# Patient Record
Sex: Female | Born: 1935 | Race: White | Hispanic: No | Smoking: Never smoker
Health system: Southern US, Community
[De-identification: ages and names within clinical notes are randomized; demographics above are authoritative.]

## PROBLEM LIST (undated history)

## (undated) DIAGNOSIS — I5042 Chronic combined systolic (congestive) and diastolic (congestive) heart failure: Secondary | ICD-10-CM

## (undated) DIAGNOSIS — H269 Unspecified cataract: Secondary | ICD-10-CM

## (undated) DIAGNOSIS — R001 Bradycardia, unspecified: Secondary | ICD-10-CM

## (undated) DIAGNOSIS — I340 Nonrheumatic mitral (valve) insufficiency: Secondary | ICD-10-CM

## (undated) DIAGNOSIS — H409 Unspecified glaucoma: Secondary | ICD-10-CM

## (undated) DIAGNOSIS — E119 Type 2 diabetes mellitus without complications: Secondary | ICD-10-CM

## (undated) DIAGNOSIS — S301XXA Contusion of abdominal wall, initial encounter: Secondary | ICD-10-CM

## (undated) DIAGNOSIS — I4892 Unspecified atrial flutter: Secondary | ICD-10-CM

## (undated) DIAGNOSIS — I10 Essential (primary) hypertension: Secondary | ICD-10-CM

## (undated) DIAGNOSIS — N2889 Other specified disorders of kidney and ureter: Secondary | ICD-10-CM

## (undated) DIAGNOSIS — I480 Paroxysmal atrial fibrillation: Secondary | ICD-10-CM

## (undated) DIAGNOSIS — K219 Gastro-esophageal reflux disease without esophagitis: Secondary | ICD-10-CM

## (undated) DIAGNOSIS — E785 Hyperlipidemia, unspecified: Secondary | ICD-10-CM

## (undated) DIAGNOSIS — S3011XA Contusion of abdominal wall, initial encounter: Secondary | ICD-10-CM

## (undated) DIAGNOSIS — M1990 Unspecified osteoarthritis, unspecified site: Secondary | ICD-10-CM

## (undated) HISTORY — DX: Other specified disorders of kidney and ureter: N28.89

## (undated) HISTORY — DX: Contusion of abdominal wall, initial encounter: S30.11XA

## (undated) HISTORY — PX: CATARACT EXTRACTION: SUR2

## (undated) HISTORY — PX: WISDOM TOOTH EXTRACTION: SHX21

## (undated) HISTORY — DX: Unspecified cataract: H26.9

## (undated) HISTORY — DX: Gastro-esophageal reflux disease without esophagitis: K21.9

## (undated) HISTORY — DX: Unspecified glaucoma: H40.9

## (undated) HISTORY — DX: Chronic combined systolic (congestive) and diastolic (congestive) heart failure: I50.42

## (undated) HISTORY — DX: Type 2 diabetes mellitus without complications: E11.9

## (undated) HISTORY — DX: Unspecified osteoarthritis, unspecified site: M19.90

## (undated) HISTORY — PX: OTHER SURGICAL HISTORY: SHX169

## (undated) HISTORY — DX: Bradycardia, unspecified: R00.1

## (undated) HISTORY — DX: Essential (primary) hypertension: I10

## (undated) HISTORY — DX: Hyperlipidemia, unspecified: E78.5

## (undated) HISTORY — DX: Contusion of abdominal wall, initial encounter: S30.1XXA

---

## 2013-07-05 ENCOUNTER — Ambulatory Visit: Payer: Self-pay | Admitting: Ophthalmology

## 2013-08-09 ENCOUNTER — Ambulatory Visit: Payer: Self-pay | Admitting: Ophthalmology

## 2013-12-15 NOTE — Telephone Encounter (Signed)
This encounter was created in error - please disregard.

## 2014-04-06 LAB — HM DIABETES EYE EXAM

## 2014-04-24 ENCOUNTER — Emergency Department: Payer: Self-pay | Admitting: Emergency Medicine

## 2014-04-25 DIAGNOSIS — S52602A Unspecified fracture of lower end of left ulna, initial encounter for closed fracture: Secondary | ICD-10-CM

## 2014-04-25 DIAGNOSIS — S52502A Unspecified fracture of the lower end of left radius, initial encounter for closed fracture: Secondary | ICD-10-CM | POA: Insufficient documentation

## 2014-04-26 ENCOUNTER — Ambulatory Visit: Payer: Self-pay | Admitting: Orthopedic Surgery

## 2014-07-29 NOTE — Op Note (Signed)
PATIENT NAME:  Amber Wolfe, Amber Wolfe MR#:  [id number] DATE OF BIRTH:  07/29/1931  DATE OF PROCEDURE:  04/26/2014  PREOPERATIVE DIAGNOSIS: Comminuted left distal radius fracture, intra-articular.   POSTOPERATIVE DIAGNOSIS: Comminuted left distal radius fracture, intra-articular.  PROCEDURE: Open reduction and internal fixation, left distal radius.   ANESTHESIA: General.   SURGEON: Hessie Knows, MD   DESCRIPTION OF PROCEDURE: The patient was brought to the operating room and after adequate anesthesia was obtained, the left arm was prepped and draped in the usual sterile fashion with a tourniquet applied to the upper arm. After patient identification and timeout procedures were completed, volar approach made to the distal radius. The FCR tendon was identified and the sheath incised. Tendon was retracted radially. Deep fascia incised. The pronator was partially torn off the distal fragment and proximal fragment. It was then elevated off the radial border. Fingertrap traction was applied to the start of the case and this helped to restore length. Freer elevator was used to get essentially anatomic alignment. Distal first approach was utilized with a short narrow DVR plate. This was applied distally, and a pin used to hold it in position. The smooth pegs were placed and then with three 10 mm cortical screws, the plate was brought down to the shaft and there was essentially anatomic alignment present. The wound was thoroughly irrigated. The tourniquet let down. Hemostasis was checked with electrocautery. The wound was then closed with 3-0 Vicryl subcutaneously and 4-0 nylon skin sutures. Xeroform, 4 x 4's, Webril, and a volar splint were applied followed by an Ace wrap.   TOURNIQUET TIME: 18 minutes at 250 mmHg.   COMPLICATIONS: None.  SPECIMEN: None.    ____________________________ Laurene Footman, MD mjm:bm D: 04/26/2014 19:11:51 ET T: 04/27/2014 02:02:08 ET JOB#: [id number]  cc: Laurene Footman, MD,  <Dictator> Laurene Footman MD ELECTRONICALLY SIGNED 04/27/2014 10:01

## 2015-04-15 LAB — HM DIABETES EYE EXAM

## 2015-04-16 LAB — HM DIABETES EYE EXAM

## 2015-04-29 ENCOUNTER — Ambulatory Visit: Payer: Self-pay | Admitting: Family Medicine

## 2015-05-15 ENCOUNTER — Ambulatory Visit (INDEPENDENT_AMBULATORY_CARE_PROVIDER_SITE_OTHER): Payer: Medicare Other | Admitting: Family Medicine

## 2015-05-15 ENCOUNTER — Encounter: Payer: Self-pay | Admitting: Family Medicine

## 2015-05-15 VITALS — BP 134/74 | HR 88 | Temp 97.1°F | Ht <= 58 in | Wt 174.4 lb

## 2015-05-15 DIAGNOSIS — I4891 Unspecified atrial fibrillation: Secondary | ICD-10-CM | POA: Insufficient documentation

## 2015-05-15 DIAGNOSIS — E7800 Pure hypercholesterolemia, unspecified: Secondary | ICD-10-CM | POA: Diagnosis not present

## 2015-05-15 DIAGNOSIS — R002 Palpitations: Secondary | ICD-10-CM | POA: Diagnosis not present

## 2015-05-15 DIAGNOSIS — Z5181 Encounter for therapeutic drug level monitoring: Secondary | ICD-10-CM

## 2015-05-15 DIAGNOSIS — M19041 Primary osteoarthritis, right hand: Secondary | ICD-10-CM

## 2015-05-15 DIAGNOSIS — R600 Localized edema: Secondary | ICD-10-CM | POA: Insufficient documentation

## 2015-05-15 DIAGNOSIS — M19042 Primary osteoarthritis, left hand: Secondary | ICD-10-CM

## 2015-05-15 DIAGNOSIS — I10 Essential (primary) hypertension: Secondary | ICD-10-CM | POA: Insufficient documentation

## 2015-05-15 DIAGNOSIS — E1140 Type 2 diabetes mellitus with diabetic neuropathy, unspecified: Secondary | ICD-10-CM | POA: Insufficient documentation

## 2015-05-15 DIAGNOSIS — H9193 Unspecified hearing loss, bilateral: Secondary | ICD-10-CM

## 2015-05-15 MED ORDER — LISINOPRIL 10 MG PO TABS: 10.0000 mg | ORAL_TABLET | Freq: Every day | ORAL | Status: DC

## 2015-05-15 MED ORDER — RIVAROXABAN 20 MG PO TABS: 20.0000 mg | ORAL_TABLET | Freq: Every day | ORAL | Status: DC

## 2015-05-15 MED ORDER — ASPIRIN EC 81 MG PO TBEC: 81.0000 mg | DELAYED_RELEASE_TABLET | Freq: Every day | ORAL | Status: DC

## 2015-05-15 MED ORDER — NAPROXEN SODIUM 220 MG PO TABS: 220.0000 mg | ORAL_TABLET | Freq: Two times a day (BID) | ORAL | Status: DC

## 2015-05-15 NOTE — Assessment & Plan Note (Signed)
Will stop the aleve for now because of addition of the anticoagulant; plan to suggest turmeric and tylenol at f/u next week

## 2015-05-15 NOTE — Assessment & Plan Note (Addendum)
Patient cannot recall the last time anyone listened to her heart; she is completely asymptomatic; she could have gone into atrial fibrillation one week ago, one month ago, one year ago, we just don't know; ; risk of stroke discussed; CHA2DS2-VASc score of 5; discussed anticoagulation with her; stop aspirin and start Xarelto; calculated CrCl based on last year's creatinine with [facility] Gault, today's labs still pending though, but she's well above 50, so using 20 mg strength; discussed risk of stroke in detail; discussed F-A-S-T with her, reasons to call 911; refer to cardiologist for further evaluation; rate is controlled today

## 2015-05-15 NOTE — Assessment & Plan Note (Signed)
A1c obtained today, 7.0; discussed results with patient; she is on a sulfonylurea from her previous doctor; records today reviewed in part (more than 15 years of records brought with her today); I did not see that she has been on metformin in the past; she does not have that listed as an allergy; I don't like to use sulfonylureas in the elderly, but she comes to me on that with well-controlled A1c and only very rare hypoglycemic episodes; we will see what her creatinine is (drawn today) and may discuss switching her from sulfonylurea to metformin at next visit; foot exam done by MD today; encouraged yearly eye exams; she has very slight neuropathy in great toes; I reviewed years of records (quickly, obviously), but did not see that she has been on an ACE-I or an ARB for renal protection; urine microalbumin:creatinine was normal today; start low-dose lisinopril and see her back next week

## 2015-05-15 NOTE — Assessment & Plan Note (Signed)
She has been having pedal and ankle edema for a long time on her current CCB; will stop this and start ACE-I, which would be standard of care for her diabetes anyway; DASH guidelines discussed and encouraged; return in one week for recheck

## 2015-05-15 NOTE — Patient Instructions (Addendum)
STOP felodipine STOP the aleve STOP the aspirin Start the new blood thinner Start new blood pressure medicine called lisinopril Avoid salt substitutes Please do schedule an appointment to see your eye doctor, and have your eyes examined every year Check your feet every night and let me know right away of any sores, infections, numbness, etc. Try to limit sweets, white bread, white rice, white potatoes It is okay for you to not check your fingerstick blood sugars (per [employer] of Endocrinology Best Practices) Your goal blood pressure is less than 140 mmHg on top. Try to follow the DASH guidelines (DASH stands for Dietary Approaches to Stop Hypertension) Try to limit the sodium in your diet.  Ideally, consume less than 1.5 grams (less than 1,500mg ) per day. Do not add salt when cooking or at the table.  Check the sodium amount on labels when shopping, and choose items lower in sodium when given a choice. Avoid or limit foods that already contain a lot of sodium. Eat a diet rich in fruits and vegetables and whole grains. Return in two weeks for recheck of blood pressure and kidneys and potassium; you do not need to come fasting Try to limit saturated fats in your diet (bologna, hot dogs, barbeque, cheeseburgers, hamburgers, steak, bacon, sausage, cheese, etc.) and get more fresh fruits, vegetables, and whole grains You might try buckwheat pancakes instead of regular white flour which might be better for your diabetes Return in one week  Stroke Prevention Some medical conditions and behaviors are associated with an increased chance of having a stroke. You may prevent a stroke by making healthy choices and managing medical conditions. HOW CAN I REDUCE MY RISK OF HAVING A STROKE?   Stay physically active. Get at least 30 minutes of activity on most or all days.  Do not smoke. It may also be helpful to avoid exposure to secondhand smoke.  Limit alcohol use. Moderate alcohol use is  considered to be:  No more than 2 drinks per day for men.  No more than 1 drink per day for nonpregnant women.  Eat healthy foods. This involves:  Eating 5 or more servings of fruits and vegetables a day.  Making dietary changes that address high blood pressure (hypertension), high cholesterol, diabetes, or obesity.  Manage your cholesterol levels.  Making food choices that are high in fiber and low in saturated fat, trans fat, and cholesterol may control cholesterol levels.  Take any prescribed medicines to control cholesterol as directed by your health care provider.  Manage your diabetes.  Controlling your carbohydrate and sugar intake is recommended to manage diabetes.  Take any prescribed medicines to control diabetes as directed by your health care provider.  Control your hypertension.  Making food choices that are low in salt (sodium), saturated fat, trans fat, and cholesterol is recommended to manage hypertension.  Ask your health care provider if you need treatment to lower your blood pressure. Take any prescribed medicines to control hypertension as directed by your health care provider.  If you are 18-34 years of age, have your blood pressure checked every 3-5 years. If you are 71 years of age or older, have your blood pressure checked every year.  Maintain a healthy weight.  Reducing calorie intake and making food choices that are low in sodium, saturated fat, trans fat, and cholesterol are recommended to manage weight.  Stop drug abuse.  Avoid taking birth control pills.  Talk to your health care provider about the risks of taking  birth control pills if you are over 41 years old, smoke, get migraines, or have ever had a blood clot.  Get evaluated for sleep disorders (sleep apnea).  Talk to your health care provider about getting a sleep evaluation if you snore a lot or have excessive sleepiness.  Take medicines only as directed by your health care  provider.  For some people, aspirin or blood thinners (anticoagulants) are helpful in reducing the risk of forming abnormal blood clots that can lead to stroke. If you have the irregular heart rhythm of atrial fibrillation, you should be on a blood thinner unless there is a good reason you cannot take them.  Understand all your medicine instructions.  Make sure that other conditions (such as anemia or atherosclerosis) are addressed. SEEK IMMEDIATE MEDICAL CARE IF:   You have sudden weakness or numbness of the face, arm, or leg, especially on one side of the body.  Your face or eyelid droops to one side.  You have sudden confusion.  You have trouble speaking (aphasia) or understanding.  You have sudden trouble seeing in one or both eyes.  You have sudden trouble walking.  You have dizziness.  You have a loss of balance or coordination.  You have a sudden, severe headache with no known cause.  You have new chest pain or an irregular heartbeat. Any of these symptoms may represent a serious problem that is an emergency. Do not wait to see if the symptoms will go away. Get medical help at once. Call your local emergency services (911 in [place] Do not drive yourself to the hospital.   This information is not intended to replace advice given to you by your health care provider. Make sure you discuss any questions you have with your health care provider.   Document Released: 04/23/2004 Document Revised: 04/06/2014 Document Reviewed: 09/16/2012 Elsevier Interactive Patient Education [employer].

## 2015-05-15 NOTE — Assessment & Plan Note (Addendum)
Very likely related to the calcium channel blocker; she does not appear fluid-overloaded and I do not suspect CHF; stop the CCB; recheck in one week

## 2015-05-15 NOTE — Assessment & Plan Note (Signed)
Check SGPT on the statin 

## 2015-05-15 NOTE — Assessment & Plan Note (Signed)
Open invitation for hearing evaluation; patient politely declined right now but may call if she changes her mind

## 2015-05-15 NOTE — Progress Notes (Signed)
BP 134/74 mmHg  Pulse 88  Temp(Src) 97.1 F (36.2 C)  Ht 4' 4.25" (1.327 m)  Wt 174 lb 6.4 oz (79.107 kg)  BMI 44.92 kg/m2  SpO2 97%   Subjective:    Patient ID: Amber Wolfe, female    DOB: 1931/05/16, 82 y.o.   MRN: [id number]  HPI: Amber Wolfe is a 82 y.o. female  Chief Complaint  Patient presents with  . Establish Care  . Knee Pain    Recieves cortison shots. Hasn't had one in about 8 months so, patient is in pain.    Diabetes, type 2; 8-9 years; does not check sugars; with neuropathy; last eye exam was Jan 9th she thinks (she brought in records and I found an eye exam from Jan 2016, but not 2017, but there is a huge folder full of records), no eye complications; no kidney damage known; does have dry mouth; no blurred vision; not especially having a lot of urine; last blood work was May 2016; brother had bad diabetes and took insulin; she does take an ACE-I  High cholesterol; typical American diet mostly; likes boiled eggs; not much bacon, but does eat some; little bit of cheese like on a cheeseburger or on a bagel; atorvastatin, not hurting all over; no abd pain  High blood pressure; always good; on felodipine for a long time; has ankle and foot swelling, the left foot and ankle is always a little worse than the right foot and ankle, nothing new  Arthitis, hands and right knee; had injection in the left knee by Dr. Hardin Negus and never needed another one in the left; has tramadol; just once in a while; does not take that often; takes two aleve BID sometimes but not all the time; hydrocodone/apap but not taking so taken off of list  Terrible heartburn; spaghetti sauce, anything fried; coffee is okay; salads and onions bother her; fish and spaghetti and junk food  Upon listening to the patient today, I asked about skipped beats or palpitations; she denies any cardiac symptoms; no chest pain, no palpitations; she can not remember the last time a doctor listened to her heart  before today  Past Medical History  Diagnosis Date  . Hyperlipidemia   . Hypertension   . GERD (gastroesophageal reflux disease)   . Diabetes mellitus without complication ([place])     Type II  . Arthritis     In hands  . Cataract   . Glaucoma     Right Eye   Past Surgical History  Procedure Laterality Date  . Cataract extraction Bilateral   . Wrist surgery Left   . Cesarean section    . Wisdom tooth extraction    wrist surgery after falling on ice -- distal left radius fracture  Family History  Problem Relation Age of Onset  . Cataracts Mother   . Heart attack Father   . Heart attack Brother   . Aneurysm Daughter    Social History  Substance Use Topics  . Smoking status: Never Smoker   . Smokeless tobacco: Never Used  . Alcohol Use: No   Relevant past medical, surgical, family and social history reviewed and updated as indicated  Allergies and medications reviewed and updated.  Review of Systems  Constitutional: Negative for unexpected weight change.  HENT: Positive for hearing loss (getting a little worse). Negative for nosebleeds.   Eyes: Negative for visual disturbance.  Respiratory: Negative for shortness of breath.   Cardiovascular: Positive for leg swelling. Negative  for chest pain and palpitations.  Gastrointestinal: Negative for blood in stool.  Endocrine: Negative for polydipsia and polyuria.  Neurological: Negative for tremors.  Hematological: Bruises/bleeds easily (on aspirin).  Psychiatric/Behavioral: Negative for dysphoric mood.   Per HPI unless specifically indicated above     Objective:    BP 134/74 mmHg  Pulse 88  Temp(Src) 97.1 F (36.2 C)  Ht 4' 4.25" (1.327 m)  Wt 174 lb 6.4 oz (79.107 kg)  BMI 44.92 kg/m2  SpO2 97%  Wt Readings from Last 3 Encounters:  05/15/15 174 lb 6.4 oz (79.107 kg)    Physical Exam  Constitutional: She appears well-developed and well-nourished. No distress.  HENT:  Head: Normocephalic and atraumatic.   Eyes: EOM are normal. No scleral icterus.  Neck: No thyromegaly present.  Cardiovascular: Normal rate and normal heart sounds.  An irregularly irregular rhythm present.  Extrasystoles are present. Exam reveals no gallop.   No murmur (no murmurs heard over RUSB, LUSB, LLSB, or apex) heard. Pulmonary/Chest: Effort normal and breath sounds normal. No respiratory distress. She has no wheezes.  Abdominal: Soft. Bowel sounds are normal. She exhibits no distension.  Musculoskeletal: Normal range of motion. She exhibits edema (left > right, pedal and ankle edema, 1+ pitting).  Neurological: She is alert. She exhibits normal muscle tone.  Very minimally diminished sensation in distal great toes plantar surface; otherwise, intact to monofilament testing  Skin: Skin is warm and dry. She is not diaphoretic. No pallor.  Psychiatric: She has a normal mood and affect. Her behavior is normal. Judgment and thought content normal.   Diabetic Foot Form - Detailed   Diabetic Foot Exam - detailed  Diabetic Foot exam was performed with the following findings:  Yes 05/15/2015  4:43 PM  Visual Foot Exam completed.:  Yes  Are the toenails long?:  No  Are the toenails thick?:  No  Are the toenails ingrown?:  No    Pulse Foot Exam completed.:  Yes  Right Dorsalis Pedis:  Present Left Dorsalis Pedis:  Present  Sensory Foot Exam Completed.:  Yes  Swelling:  Yes  Semmes-Weinstein Monofilament Test  R Site 1-Great Toe:  Pos L Site 1-Great Toe:  Pos  R Site 4:  Pos L Site 4:  Pos  R Site 5:  Pos L Site 5:  Pos       Results for orders placed or performed in visit on 04/17/15  HM DIABETES EYE EXAM  Result Value Ref Range   HM Diabetic Eye Exam  No Retinopathy      Assessment & Plan:   Problem List Items Addressed This Visit      Cardiovascular and Mediastinum   Essential hypertension, benign    She has been having pedal and ankle edema for a long time on her current CCB; will stop this and start ACE-I,  which would be standard of care for her diabetes anyway; DASH guidelines discussed and encouraged; return in one week for recheck      Relevant Medications   atorvastatin (LIPITOR) 20 MG tablet   lisinopril (PRINIVIL,ZESTRIL) 10 MG tablet   rivaroxaban (XARELTO) 20 MG TABS tablet   Atrial fibrillation (HCC)    Patient cannot recall the last time anyone listened to her heart; she is completely asymptomatic; she could have gone into atrial fibrillation one week ago, one month ago, one year ago, we just don't know; ; risk of stroke discussed; CHA2DS2-VASc score of 5; discussed anticoagulation with her; stop aspirin and start Xarelto;  calculated CrCl based on last year's creatinine with [facility] Gault, today's labs still pending though, but she's well above 50, so using 20 mg strength; discussed risk of stroke in detail; discussed F-A-S-T with her, reasons to call 911; refer to cardiologist for further evaluation; rate is controlled today      Relevant Medications   atorvastatin (LIPITOR) 20 MG tablet   lisinopril (PRINIVIL,ZESTRIL) 10 MG tablet   rivaroxaban (XARELTO) 20 MG TABS tablet   Other Relevant Orders   Ambulatory referral to Cardiology     Endocrine   Diabetes mellitus with neuropathy ([place]) - Primary    A1c obtained today, 7.0; discussed results with patient; she is on a sulfonylurea from her previous doctor; records today reviewed in part (more than 15 years of records brought with her today); I did not see that she has been on metformin in the past; she does not have that listed as an allergy; I don't like to use sulfonylureas in the elderly, but she comes to me on that with well-controlled A1c and only very rare hypoglycemic episodes; we will see what her creatinine is (drawn today) and may discuss switching her from sulfonylurea to metformin at next visit; foot exam done by MD today; encouraged yearly eye exams; she has very slight neuropathy in great toes; I reviewed years of records  (quickly, obviously), but did not see that she has been on an ACE-I or an ARB for renal protection; urine microalbumin:creatinine was normal today; start low-dose lisinopril and see her back next week      Relevant Medications   atorvastatin (LIPITOR) 20 MG tablet   glimepiride (AMARYL) 2 MG tablet   lisinopril (PRINIVIL,ZESTRIL) 10 MG tablet   Other Relevant Orders   Microalbumin, Urine Waived   Bayer DCA Hb A1c Waived     Musculoskeletal and Integument   Osteoarthritis of both hands    Will stop the aleve for now because of addition of the anticoagulant; plan to suggest turmeric and tylenol at f/u next week      Relevant Medications   traMADol (ULTRAM) 50 MG tablet     Other   High cholesterol    Lipids checked in house today; reviewed with patient and gave her a copy of her numbers; continue statin; SGPT pending      Relevant Medications   atorvastatin (LIPITOR) 20 MG tablet   lisinopril (PRINIVIL,ZESTRIL) 10 MG tablet   rivaroxaban (XARELTO) 20 MG TABS tablet   Other Relevant Orders   Lipid Panel [place], Waived   Medication monitoring encounter    Check SGPT on the statin      Relevant Orders   Comprehensive metabolic panel   Pedal edema    Very likely related to the calcium channel blocker; she does not appear fluid-overloaded and I do not suspect CHF; stop the CCB; recheck in one week      Decreased hearing of both ears    Open invitation for hearing evaluation; patient politely declined right now but may call if she changes her mind       Other Visit Diagnoses    Palpitations        Relevant Orders    EKG 12-Lead (Completed)       Follow up plan: Return in about 1 week (around 05/22/2015) for nonfasting labs and blood pressure visit with Dr. Sanda Klein.  EKG interpreted by me: atrial fibrillation, variable rate, no significant ST-T wave abnormalities  Orders Placed This Encounter  Procedures  . Microalbumin, Urine  Waived  . Lipid Panel [place], [place]  .  Bayer DCA Hb A1c Waived  . Comprehensive metabolic panel  . Ambulatory referral to Cardiology  . EKG 12-Lead   Meds ordered this encounter  Medications  . atorvastatin (LIPITOR) 20 MG tablet    Sig: Take by mouth.  . DISCONTD: felodipine (PLENDIL) 10 MG 24 hr tablet    Sig: Take by mouth.  Marland Kitchen glimepiride (AMARYL) 2 MG tablet    Sig: Take by mouth.  . DISCONTD: HYDROcodone-acetaminophen (NORCO/VICODIN) 5-325 MG tablet    Sig: Take by mouth.  . latanoprost (XALATAN) 0.005 % ophthalmic solution    Sig:   . DISCONTD: timolol (BETIMOL) 0.5 % ophthalmic solution    Sig: Apply to eye.  . traMADol (ULTRAM) 50 MG tablet    Sig: Take by mouth.  Marland Kitchen omeprazole (PRILOSEC) 20 MG capsule    Sig:   . timolol (TIMOPTIC) 0.5 % ophthalmic solution    Sig:   . lisinopril (PRINIVIL,ZESTRIL) 10 MG tablet    Sig: Take 1 tablet (10 mg total) by mouth daily.    Dispense:  30 tablet    Refill:  0    STOP felodipine  . DISCONTD: aspirin EC 81 MG tablet    Sig: Take 1 tablet (81 mg total) by mouth daily.    Dispense:  30 tablet    Refill:  11  . DISCONTD: naproxen sodium (ANAPROX) 220 MG tablet    Sig: Take 1-2 tablets (220-440 mg total) by mouth 2 (two) times daily with a meal. If needed for pain    Dispense:  1 tablet    Refill:  0  . rivaroxaban (XARELTO) 20 MG TABS tablet    Sig: Take 1 tablet (20 mg total) by mouth daily with supper. STOP aspirin    Dispense:  30 tablet    Refill:  0    Medications Discontinued During This Encounter  Medication Reason  . timolol (BETIMOL) 0.5 % ophthalmic solution Duplicate  . HYDROcodone-acetaminophen (NORCO/VICODIN) 5-325 MG tablet Completed Course  . felodipine (PLENDIL) 10 MG 24 hr tablet Side effect (s)  . aspirin EC 81 MG tablet Discontinued by provider  . naproxen sodium (ANAPROX) 220 MG tablet Discontinued by provider   Face-to-face time with patient was more than 60 minutes, >50% time spent counseling and coordination of care

## 2015-05-15 NOTE — Assessment & Plan Note (Signed)
Lipids checked in house today; reviewed with patient and gave her a copy of her numbers; continue statin; SGPT pending

## 2015-05-16 LAB — LIPID PANEL PICCOLO, WAIVED
CHOL/HDL RATIO PICCOLO,WAIVE: 2.5 mg/dL
Cholesterol Piccolo, Waived: 159 mg/dL (ref <200)
HDL Chol Piccolo, Waived: 62 mg/dL (ref >59)
LDL Chol Calc Piccolo Waived: 71 mg/dL (ref <100)
Triglycerides Piccolo,Waived: 124 mg/dL (ref <150)
VLDL CHOL CALC PICCOLO,WAIVE: 25 mg/dL (ref <30)

## 2015-05-16 LAB — COMPREHENSIVE METABOLIC PANEL
A/G RATIO: 1.8 (ref 1.1–2.5)
ALT: 7 IU/L (ref 0–32)
AST: 13 IU/L (ref 0–40)
Albumin: 4.2 g/dL (ref 3.5–4.7)
Alkaline Phosphatase: 102 IU/L (ref 39–117)
BILIRUBIN TOTAL: 0.5 mg/dL (ref 0.0–1.2)
BUN/Creatinine Ratio: 22 (ref 11–26)
BUN: 14 mg/dL (ref 8–27)
CALCIUM: 9.4 mg/dL (ref 8.7–10.3)
CHLORIDE: 103 mmol/L (ref 96–106)
CO2: 26 mmol/L (ref 18–29)
Creatinine, Ser: 0.65 mg/dL (ref 0.57–1.00)
GFR calc non Af Amer: 84 mL/min/{1.73_m2} (ref >59)
GFR, EST AFRICAN AMERICAN: 96 mL/min/{1.73_m2} (ref >59)
GLUCOSE: 112 mg/dL — AB (ref 65–99)
Globulin, Total: 2.4 g/dL (ref 1.5–4.5)
POTASSIUM: 4.2 mmol/L (ref 3.5–5.2)
Sodium: 142 mmol/L (ref 134–144)
TOTAL PROTEIN: 6.6 g/dL (ref 6.0–8.5)

## 2015-05-16 LAB — MICROALBUMIN, URINE WAIVED
CREATININE, URINE WAIVED: 50 mg/dL (ref 10–300)
Microalb, Ur Waived: 10 mg/L (ref 0–19)

## 2015-05-16 LAB — BAYER DCA HB A1C WAIVED: HB A1C: 7 % — AB (ref <7.0)

## 2015-05-17 ENCOUNTER — Encounter: Payer: Medicare Other | Admitting: Family Medicine

## 2015-05-20 ENCOUNTER — Encounter: Payer: Self-pay | Admitting: Family Medicine

## 2015-05-20 ENCOUNTER — Ambulatory Visit
Admission: RE | Admit: 2015-05-20 | Discharge: 2015-05-20 | Disposition: A | Discharge status: Home / Self Care | Payer: Medicare Other | Source: Ambulatory Visit | Attending: Family Medicine | Admitting: Family Medicine

## 2015-05-20 ENCOUNTER — Ambulatory Visit (INDEPENDENT_AMBULATORY_CARE_PROVIDER_SITE_OTHER): Payer: Medicare Other | Admitting: Family Medicine

## 2015-05-20 VITALS — BP 103/69 | HR 67 | Temp 97.6°F | Ht 62.25 in | Wt 175.6 lb

## 2015-05-20 DIAGNOSIS — I4891 Unspecified atrial fibrillation: Secondary | ICD-10-CM

## 2015-05-20 DIAGNOSIS — M179 Osteoarthritis of knee, unspecified: Secondary | ICD-10-CM | POA: Diagnosis not present

## 2015-05-20 DIAGNOSIS — I10 Essential (primary) hypertension: Secondary | ICD-10-CM

## 2015-05-20 DIAGNOSIS — Z780 Asymptomatic menopausal state: Secondary | ICD-10-CM | POA: Diagnosis not present

## 2015-05-20 DIAGNOSIS — Z1239 Encounter for other screening for malignant neoplasm of breast: Secondary | ICD-10-CM

## 2015-05-20 DIAGNOSIS — M1711 Unilateral primary osteoarthritis, right knee: Secondary | ICD-10-CM | POA: Insufficient documentation

## 2015-05-20 DIAGNOSIS — R600 Localized edema: Secondary | ICD-10-CM

## 2015-05-20 MED ORDER — DABIGATRAN ETEXILATE MESYLATE 150 MG PO CAPS
150.0000 mg | ORAL_CAPSULE | Freq: Two times a day (BID) | ORAL | Status: DC
Start: 2015-05-20 — End: 2015-05-20

## 2015-05-20 MED ORDER — LISINOPRIL 10 MG PO TABS: 5.0000 mg | ORAL_TABLET | Freq: Every day | ORAL | Status: DC

## 2015-05-20 MED ORDER — GLIMEPIRIDE 2 MG PO TABS: 2.0000 mg | ORAL_TABLET | Freq: Every day | ORAL | Status: DC

## 2015-05-20 MED ORDER — APIXABAN 5 MG PO TABS
5.0000 mg | ORAL_TABLET | Freq: Two times a day (BID) | ORAL | Status: DC
Start: 2015-05-20 — End: 2015-05-20

## 2015-05-20 MED ORDER — RIVAROXABAN 20 MG PO TABS: 20.0000 mg | ORAL_TABLET | Freq: Every day | ORAL | Status: DC

## 2015-05-20 NOTE — Assessment & Plan Note (Signed)
Patient reports that she has never had xrays of her knee; will get plain films today; she may benefit from seeing ortho and consider synvisc-type injections; she is off of NSAIDs; may use tylenol and/or turmeric

## 2015-05-20 NOTE — Patient Instructions (Addendum)
You can go have xrays done of your right knee at [facility] today or tomorrow without an appointment  Please do call to schedule your bone density and mammogram; the number to schedule one at either [facility] or [facility] is [phone]  Decrease your blood pressure pill lisinopril from 10 mg down to 5 mg daily (just half of a 10 mg pill)  Start the Xarelto (blood thinner) today (it was approved by your insurance, and I spoke to "Hailey" today)  Return on Thursday  Atrial Fibrillation Atrial fibrillation is a type of irregular or rapid heartbeat (arrhythmia). In atrial fibrillation, the heart quivers continuously in a chaotic pattern. This occurs when parts of the heart receive disorganized signals that make the heart unable to pump blood normally. This can increase the risk for stroke, heart failure, and other heart-related conditions. There are different types of atrial fibrillation, including:  Paroxysmal atrial fibrillation. This type starts suddenly, and it usually stops on its own shortly after it starts.  Persistent atrial fibrillation. This type often lasts longer than a week. It may stop on its own or with treatment.  Long-lasting persistent atrial fibrillation. This type lasts longer than 12 months.  Permanent atrial fibrillation. This type does not go away. Talk with your health care provider to learn about the type of atrial fibrillation that you have. CAUSES This condition is caused by some heart-related conditions or procedures, including:  A heart attack.  Coronary artery disease.  Heart failure.  Heart valve conditions.  High blood pressure.  Inflammation of the sac that surrounds the heart (pericarditis).  Heart surgery.  Certain heart rhythm disorders, such as Wolf-Parkinson-White syndrome. Other causes include:  Pneumonia.  Obstructive sleep apnea.  Blockage of an artery in the lungs (pulmonary  embolism, or PE).  Lung cancer.  Chronic lung disease.  Thyroid problems, especially if the thyroid is overactive (hyperthyroidism).  Caffeine.  Excessive alcohol use or illegal drug use.  Use of some medicines, including certain decongestants and diet pills. Sometimes, the cause cannot be found. RISK FACTORS This condition is more likely to develop in:  People who are older in age.  People who smoke.  People who have diabetes mellitus.  People who are overweight (obese).  Athletes who exercise vigorously. SYMPTOMS Symptoms of this condition include:  A feeling that your heart is beating rapidly or irregularly.  A feeling of discomfort or pain in your chest.  Shortness of breath.  Sudden light-headedness or weakness.  Getting tired easily during exercise. In some cases, there are no symptoms. DIAGNOSIS Your health care provider may be able to detect atrial fibrillation when taking your pulse. If detected, this condition may be diagnosed with:  An electrocardiogram (ECG).  A Holter monitor test that records your heartbeat patterns over a 24-hour period.  Transthoracic echocardiogram (TTE) to evaluate how blood flows through your heart.  Transesophageal echocardiogram (TEE) to view more detailed images of your heart.  A stress test.  Imaging tests, such as a CT scan or chest X-ray.  Blood tests. TREATMENT The main goals of treatment are to prevent blood clots from forming and to keep your heart beating at a normal rate and rhythm. The type of treatment that you receive depends on many factors, such as your underlying medical conditions and how you feel when you are experiencing atrial fibrillation. This condition may be treated with:  Medicine to slow down the heart rate, bring the heart's rhythm back to  normal, or prevent clots from forming.  Electrical cardioversion. This is a procedure that resets your heart's rhythm by delivering a controlled, low-energy  shock to the heart through your skin.  Different types of ablation, such as catheter ablation, catheter ablation with pacemaker, or surgical ablation. These procedures destroy the heart tissues that send abnormal signals. When the pacemaker is used, it is placed under your skin to help your heart beat in a regular rhythm. HOME CARE INSTRUCTIONS  Take over-the counter and prescription medicines only as told by your health care provider.  If your health care provider prescribed a blood-thinning medicine (anticoagulant), take it exactly as told. Taking too much blood-thinning medicine can cause bleeding. If you do not take enough blood-thinning medicine, you will not have the protection that you need against stroke and other problems.  Do not use tobacco products, including cigarettes, chewing tobacco, and e-cigarettes. If you need help quitting, ask your health care provider.  If you have obstructive sleep apnea, manage your condition as told by your health care provider.  Do not drink alcohol.  Do not drink beverages that contain caffeine, such as coffee, soda, and tea.  Maintain a healthy weight. Do not use diet pills unless your health care provider approves. Diet pills may make heart problems worse.  Follow diet instructions as told by your health care provider.  Exercise regularly as told by your health care provider.  Keep all follow-up visits as told by your health care provider. This is important. PREVENTION  Avoid drinking beverages that contain caffeine or alcohol.  Avoid certain medicines, especially medicines that are used for breathing problems.  Avoid certain herbs and herbal medicines, such as those that contain ephedra or ginseng.  Do not use illegal drugs, such as cocaine and amphetamines.  Do not smoke.  Manage your high blood pressure. SEEK MEDICAL CARE IF:  You notice a change in the rate, rhythm, or strength of your heartbeat.  You are taking an  anticoagulant and you notice increased bruising.  You tire more easily when you exercise or exert yourself. SEEK IMMEDIATE MEDICAL CARE IF:  You have chest pain, abdominal pain, sweating, or weakness.  You feel nauseous.  You notice blood in your vomit, bowel movement, or urine.  You have shortness of breath.  You suddenly have swollen feet and ankles.  You feel dizzy.  You have sudden weakness or numbness of the face, arm, or leg, especially on one side of the body.  You have trouble speaking, trouble understanding, or both (aphasia).  Your face or your eyelid droops on one side. These symptoms may represent a serious problem that is an emergency. Do not wait to see if the symptoms will go away. Get medical help right away. Call your local emergency services (911 in the [place] Do not drive yourself to the hospital.   This information is not intended to replace advice given to you by your health care provider. Make sure you discuss any questions you have with your health care provider.   Document Released: 03/16/2005 Document Revised: 12/05/2014 Document Reviewed: 07/11/2014 Elsevier Interactive Patient Education [employer].

## 2015-05-20 NOTE — Assessment & Plan Note (Signed)
Ordering mammogram today; patient was scheduled for a Medicare visit, but the visit was quickly shifted to a problem-based E/M visit; order entered; she'll return for the main Medicare visit later this week

## 2015-05-20 NOTE — Assessment & Plan Note (Addendum)
I personally called pharmacy and then the [phone] to get approval of factor Xa inhibitor; she'll go pick that up and start that today; discussed risk of stroke; call with any problems

## 2015-05-20 NOTE — Assessment & Plan Note (Signed)
Much improved with cessation of the calcium channel blocker

## 2015-05-20 NOTE — Assessment & Plan Note (Signed)
Off of CCB now (caused pedal edema) and on ACE-I (she is diabetic); BP on the lower end of expected, though she feels fine; will decrease lisinopril from 10 mg daily to 5 mg daily; DASH guidelines; recheck at f/u

## 2015-05-20 NOTE — Progress Notes (Signed)
BP 103/69 mmHg  Pulse 67  Temp(Src) 97.6 F (36.4 C)  Ht 5' 2.25" (1.581 m)  Wt 175 lb 9.6 oz (79.652 kg)  BMI 31.87 kg/m2  SpO2 100%   Subjective:    Patient ID: Amber Wolfe, female    DOB: 1931/08/05, 82 y.o.   MRN: [id number]  HPI: Amber Wolfe is a 82 y.o. female  Chief Complaint  Patient presents with  . Welcome to Medicare   Patient was apparently scheduled for a medicare visit, but I found out very early on that she is not taking her blood thinner; we converted this to a problem-based visit, therefore  She was found to have afib at last visit; we don't know if this is new onset atrial fibrillation, or if perhaps she has had it for one month or one year; she is completely asymptomatic; I prescribed Xarelto at her last visit, to take the place of aspirin, after doing the CHA2DS2-Vasc scoring recommending anticoagulation; unfortunately, she is not on her blood thinner; the Rx got sent to the pharmacy but the insurance company would not approve it without a prior authorization; I spoke with Jody at the pharmacy first; he confirmed that they received my Rx for Xarelto and we were waiting on prior auth; he gave me this number: [phone] ID [id number] I called and spoke with Hailey; went thrugh steps of prior auth in front of patient; Rx approved through Mar 29, 2016; she says they will fax something to the pharmacy within the hour No chest pain, no shortness of breath No hx of mechanical or bioprosthetic valves  Right knee pain; patient stopped the aleve (for pain in her knee); he thought it was bone on bone, hurts and swells up; never had xrays of right knee; the left knee has never given her any more problems since the cortisone shot years ago  Diabetes; needs refill of medicine; under good control; her previous doctor has always said it is perfect; not checking FSBS; mouth is dry in the mornings though  Blood pressure medicine was changed last visit; not dizzy or  light-headed; no cough; swelling in the ankles is better off of the CCB  She is trying to change her diet; she doesn't know what she wants to eat and just grabs something sometimes  Relevant past medical, surgical, family and social history reviewed and updated as indicated. Interim medical history since our last visit reviewed. Allergies and medications reviewed and updated.  Review of Systems Per HPI unless specifically indicated above     Objective:    BP 103/69 mmHg  Pulse 67  Temp(Src) 97.6 F (36.4 C)  Ht 5' 2.25" (1.581 m)  Wt 175 lb 9.6 oz (79.652 kg)  BMI 31.87 kg/m2  SpO2 100%  Wt Readings from Last 3 Encounters:  05/20/15 175 lb 9.6 oz (79.652 kg)  05/15/15 174 lb 6.4 oz (79.107 kg)    Physical Exam  Constitutional: She appears well-developed and well-nourished.  HENT:  Mouth/Throat: Mucous membranes are not dry.  Eyes: No scleral icterus.  Neck: No JVD present.  Cardiovascular: Normal rate.  An irregularly irregular rhythm present.  Pulmonary/Chest: Effort normal and breath sounds normal. No respiratory distress. She has no rales.  Abdominal: Normal appearance and bowel sounds are normal. She exhibits no distension.  Musculoskeletal: She exhibits no edema.       Right knee: She exhibits decreased range of motion, swelling and deformity (bony enlargement). She exhibits no effusion.  Skin: Skin is warm.  No bruising, no ecchymosis and no petechiae noted. No pallor.  Psychiatric: She has a normal mood and affect.   Results for orders placed or performed in visit on 05/16/15  HM DIABETES EYE EXAM  Result Value Ref Range   HM Diabetic Eye Exam No Retinopathy No Retinopathy      Assessment & Plan:   Problem List Items Addressed This Visit      Cardiovascular and Mediastinum   Essential hypertension, benign    Off of CCB now (caused pedal edema) and on ACE-I (she is diabetic); BP on the lower end of expected, though she feels fine; will decrease lisinopril from  10 mg daily to 5 mg daily; DASH guidelines; recheck at f/u      Relevant Medications   lisinopril (PRINIVIL,ZESTRIL) 10 MG tablet   rivaroxaban (XARELTO) 20 MG TABS tablet   Atrial fibrillation ([place]) - Primary    I personally called pharmacy and then the [phone] to get approval of factor Xa inhibitor; she'll go pick that up and start that today; discussed risk of stroke; call with any problems      Relevant Medications   lisinopril (PRINIVIL,ZESTRIL) 10 MG tablet   rivaroxaban (XARELTO) 20 MG TABS tablet     Musculoskeletal and Integument   Primary osteoarthritis of right knee    Patient reports that she has never had xrays of her knee; will get plain films today; she may benefit from seeing ortho and consider synvisc-type injections; she is off of NSAIDs; may use tylenol and/or turmeric      Relevant Orders   DG Knee Complete 4 Views Right     Other   Pedal edema    Much improved with cessation of the calcium channel blocker      Postmenopausal    Order entered for DEXA, which she can schedule on her own; she will return for Medicare wellness visit later this week      Breast cancer screening    Ordering mammogram today; patient was scheduled for a Medicare visit, but the visit was quickly shifted to a problem-based E/M visit; order entered; she'll return for the main Medicare visit later this week      Relevant Orders   MM DIGITAL SCREENING BILATERAL      Follow up plan: No Follow-up on file. --> return Thursday  Orders Placed This Encounter  Procedures  . DG Knee Complete 4 Views Right  . MM DIGITAL SCREENING BILATERAL   An after-visit summary was printed and given to the patient at [facility].  Please see the patient instructions which may contain other information and recommendations beyond what is mentioned above in the assessment and plan.

## 2015-05-20 NOTE — Assessment & Plan Note (Signed)
Order entered for DEXA, which she can schedule on her own; she will return for Medicare wellness visit later this week

## 2015-05-21 ENCOUNTER — Telehealth: Payer: Self-pay | Admitting: Family Medicine

## 2015-05-21 DIAGNOSIS — M1711 Unilateral primary osteoarthritis, right knee: Secondary | ICD-10-CM

## 2015-05-21 NOTE — Telephone Encounter (Signed)
I spoke with patient about xray report; severe OA; refer to ortho; she does not want surgery, but perhaps they can do cortisone injection or synvisc type injection; she's agreeable

## 2015-05-23 ENCOUNTER — Encounter: Payer: Self-pay | Admitting: Family Medicine

## 2015-05-23 ENCOUNTER — Ambulatory Visit (INDEPENDENT_AMBULATORY_CARE_PROVIDER_SITE_OTHER): Payer: Medicare Other | Admitting: Family Medicine

## 2015-05-23 VITALS — BP 135/92 | HR 61 | Temp 98.2°F | Ht 62.0 in | Wt 173.4 lb

## 2015-05-23 DIAGNOSIS — I10 Essential (primary) hypertension: Secondary | ICD-10-CM

## 2015-05-23 DIAGNOSIS — M1711 Unilateral primary osteoarthritis, right knee: Secondary | ICD-10-CM | POA: Diagnosis not present

## 2015-05-23 DIAGNOSIS — I482 Chronic atrial fibrillation, unspecified: Secondary | ICD-10-CM

## 2015-05-23 DIAGNOSIS — E7800 Pure hypercholesterolemia, unspecified: Secondary | ICD-10-CM

## 2015-05-23 MED ORDER — LISINOPRIL 5 MG PO TABS: 7.5000 mg | ORAL_TABLET | Freq: Every day | ORAL | Status: DC

## 2015-05-23 MED ORDER — ATORVASTATIN CALCIUM 20 MG PO TABS: 20.0000 mg | ORAL_TABLET | Freq: Every day | ORAL | Status: DC

## 2015-05-23 NOTE — Assessment & Plan Note (Signed)
Rx for statin sent; suggested she take at night instead of morning

## 2015-05-23 NOTE — Progress Notes (Signed)
BP 135/92 mmHg  Pulse 61  Temp(Src) 98.2 F (36.8 C)  Ht 5\' 2"  (1.575 m)  Wt 173 lb 6.4 oz (78.654 kg)  BMI 31.71 kg/m2  SpO2 95%   Subjective:    Patient ID: Amber Wolfe, female    DOB: 09/22/1931, 82 y.o.   MRN: [id number]  HPI: Amber Wolfe is a 82 y.o. female  Chief Complaint  Patient presents with  . Follow-up    1 week follow-up. Patient states no complaints and that everything is going well, except the knee pain.    No problems on the blood thinner; no nosebleeds; no bleeding from gums; no hematuria; no blood in the stool She has no chest pain; no shortness of breath She has not heard about appt to cardiologist yet  High cholesterol; has been on atorvastatin for 5-6 years at least; no muscle aches; no abd pain or loss of appetite; needs refill  Heartburn is doing better  Had xray of knee; awaiting ortho appt  Relevant past medical history reviewed and updated as indicated Allergies and medications reviewed and updated.  Review of Systems Per HPI unless specifically indicated above     Objective:    BP 135/92 mmHg  Pulse 61  Temp(Src) 98.2 F (36.8 C)  Ht 5\' 2"  (1.575 m)  Wt 173 lb 6.4 oz (78.654 kg)  BMI 31.71 kg/m2  SpO2 95%  Wt Readings from Last 3 Encounters:  05/23/15 173 lb 6.4 oz (78.654 kg)  05/20/15 175 lb 9.6 oz (79.652 kg)  05/15/15 174 lb 6.4 oz (79.107 kg)    Physical Exam  Constitutional: She appears well-developed and well-nourished. No distress.  Cardiovascular: Normal rate.  An irregularly irregular rhythm present.  Pulmonary/Chest: Effort normal and breath sounds normal.  Musculoskeletal: She exhibits no edema.  Skin: No bruising and no ecchymosis noted.  Psychiatric: She has a normal mood and affect.    Results for orders placed or performed in visit on 05/16/15  HM DIABETES EYE EXAM  Result Value Ref Range   HM Diabetic Eye Exam No Retinopathy No Retinopathy      Assessment & Plan:   Problem List Items  Addressed This Visit      Cardiovascular and Mediastinum   Essential hypertension, benign - Primary    Diastolic a little above goal on the 5 mg lisinopril; increase to 7.5 mg; recheck in two weeks, check BMP at that time      Relevant Medications   lisinopril (PRINIVIL,ZESTRIL) 5 MG tablet   atorvastatin (LIPITOR) 20 MG tablet   Other Relevant Orders   Basic Metabolic Panel (BMET)   Atrial fibrillation ([place])    As noted earlier; we don't know when patient went into a-fib; she is anticoagulated; appt with cardiologist in a few weeks (confirmed with [facility]), Dr. Yvone Neu; reasons to call 911 or go to the ER reviewed, including fast heart rate, shortness of breath, fluid overload, etc; see AVS for information given today; I did not start rate-limiting agent since her heart rate is 61 today      Relevant Medications   lisinopril (PRINIVIL,ZESTRIL) 5 MG tablet   atorvastatin (LIPITOR) 20 MG tablet     Musculoskeletal and Integument   Primary osteoarthritis of right knee    Severe tricompartmental OA on xray of right knee, reviewed with patient; ortho referral pending        Other   High cholesterol    Rx for statin sent; suggested she take  at night instead of morning      Relevant Medications   lisinopril (PRINIVIL,ZESTRIL) 5 MG tablet   atorvastatin (LIPITOR) 20 MG tablet       Follow up plan: Return in about 2 weeks (around 06/06/2015) for recheck with CMA and BMP; May 16th with Dr. Sanda Klein for fasting labs.  An after-visit summary was printed and given to the patient at [facility].  Please see the patient instructions which may contain other information and recommendations beyond what is mentioned above in the assessment and plan.  Meds ordered this encounter  Medications  . lisinopril (PRINIVIL,ZESTRIL) 5 MG tablet    Sig: Take 1.5 tablets (7.5 mg total) by mouth daily.    Dispense:  45 tablet    Refill:  6    10 mg was a little too much, 5 mg not  quite enough, hopefully 7.5 will be just right! :-)  . atorvastatin (LIPITOR) 20 MG tablet    Sig: Take 1 tablet (20 mg total) by mouth at bedtime.    Dispense:  30 tablet    Refill:  6

## 2015-05-23 NOTE — Assessment & Plan Note (Signed)
Severe tricompartmental OA on xray of right knee, reviewed with patient; ortho referral pending

## 2015-05-23 NOTE — Assessment & Plan Note (Signed)
As noted earlier; we don't know when patient went into a-fib; she is anticoagulated; appt with cardiologist in a few weeks (confirmed with [facility]), Dr. Yvone Neu; reasons to call 911 or go to the ER reviewed, including fast heart rate, shortness of breath, fluid overload, etc; see AVS for information given today; I did not start rate-limiting agent since her heart rate is 61 today

## 2015-05-23 NOTE — Assessment & Plan Note (Signed)
Diastolic a little above goal on the 5 mg lisinopril; increase to 7.5 mg; recheck in two weeks, check BMP at that time

## 2015-05-23 NOTE — Patient Instructions (Addendum)
You will be seeing Dr. Yvone Neu on March 14th If you develop any really fast heart rate or shortness of breath or increase in leg swelling, then do call 911 and go to the hospital right away Try to avoid excess salt Change your lisinopril to 7.5 mg daily Return to see my assistant in 2 weeks for BP and pulse and weight check and labs (nonfasting) Return to see me May 16th or just after If you don't hear back about the orthopaedist appointment by Monday, give [place] a call If you have not heard anything from my staff in a week about any orders/referrals/studies from today, please contact us here to follow-up [phone]  DASH Eating Plan DASH stands for "Dietary Approaches to Stop Hypertension." The DASH eating plan is a healthy eating plan that has been shown to reduce high blood pressure (hypertension). Additional health benefits may include reducing the risk of type 2 diabetes mellitus, heart disease, and stroke. The DASH eating plan may also help with weight loss. WHAT DO I NEED TO KNOW ABOUT THE DASH EATING PLAN? For the DASH eating plan, you will follow these general guidelines:  Choose foods with a percent daily value for sodium of less than 5% (as listed on the food label).  Use salt-free seasonings or herbs instead of table salt or sea salt.  Check with your health care provider or pharmacist before using salt substitutes.  Eat lower-sodium products, often labeled as "lower sodium" or "no salt added."  Eat fresh foods.  Eat more vegetables, fruits, and low-fat dairy products.  Choose whole grains. Look for the word "whole" as the first word in the ingredient list.  Choose fish and skinless chicken or [place] more often than red meat. Limit fish, poultry, and meat to 6 oz (170 g) each day.  Limit sweets, desserts, sugars, and sugary drinks.  Choose heart-healthy fats.  Limit cheese to 1 oz (28 g) per day.  Eat more home-cooked food and less restaurant, buffet, and fast  food.  Limit fried foods.  Cook foods using methods other than frying.  Limit canned vegetables. If you do use them, rinse them well to decrease the sodium.  When eating at a restaurant, ask that your food be prepared with less salt, or no salt if possible. WHAT FOODS CAN I EAT? Seek help from a dietitian for individual calorie needs. Grains Whole grain or whole wheat bread. Brown rice. Whole grain or whole wheat pasta. Quinoa, bulgur, and whole grain cereals. Low-sodium cereals. Corn or whole wheat flour tortillas. Whole grain cornbread. Whole grain crackers. Low-sodium crackers. Vegetables Fresh or frozen vegetables (raw, steamed, roasted, or grilled). Low-sodium or reduced-sodium tomato and vegetable juices. Low-sodium or reduced-sodium tomato sauce and paste. Low-sodium or reduced-sodium canned vegetables.  Fruits All fresh, canned (in natural juice), or frozen fruits. Meat and Other Protein Products Ground beef (85% or leaner), grass-fed beef, or beef trimmed of fat. Skinless chicken or [place]. Ground chicken or [place]. Pork trimmed of fat. All fish and seafood. Eggs. Dried beans, peas, or lentils. Unsalted nuts and seeds. Unsalted canned beans. Dairy Low-fat dairy products, such as skim or 1% milk, 2% or reduced-fat cheeses, low-fat ricotta or cottage cheese, or plain low-fat yogurt. Low-sodium or reduced-sodium cheeses. Fats and Oils Tub margarines without trans fats. Light or reduced-fat mayonnaise and salad dressings (reduced sodium). Avocado. Safflower, olive, or canola oils. Natural peanut or almond butter. Other Unsalted popcorn and pretzels. The items listed above may not be a complete list  of recommended foods or beverages. Contact your dietitian for more options. WHAT FOODS ARE NOT RECOMMENDED? Grains White bread. White pasta. White rice. Refined cornbread. Bagels and croissants. Crackers that contain trans fat. Vegetables Creamed or fried vegetables. Vegetables in a  cheese sauce. Regular canned vegetables. Regular canned tomato sauce and paste. Regular tomato and vegetable juices. Fruits Dried fruits. Canned fruit in light or heavy syrup. Fruit juice. Meat and Other Protein Products Fatty cuts of meat. Ribs, chicken wings, bacon, sausage, bologna, salami, chitterlings, fatback, hot dogs, bratwurst, and packaged luncheon meats. Salted nuts and seeds. Canned beans with salt. Dairy Whole or 2% milk, cream, half-and-half, and cream cheese. Whole-fat or sweetened yogurt. Full-fat cheeses or blue cheese. Nondairy creamers and whipped toppings. Processed cheese, cheese spreads, or cheese curds. Condiments Onion and garlic salt, seasoned salt, table salt, and sea salt. Canned and packaged gravies. Worcestershire sauce. Tartar sauce. Barbecue sauce. Teriyaki sauce. Soy sauce, including reduced sodium. Steak sauce. Fish sauce. Oyster sauce. Cocktail sauce. Horseradish. Ketchup and mustard. Meat flavorings and tenderizers. Bouillon cubes. Hot sauce. Tabasco sauce. Marinades. Taco seasonings. Relishes. Fats and Oils Butter, stick margarine, lard, shortening, ghee, and bacon fat. Coconut, palm kernel, or palm oils. Regular salad dressings. Other Pickles and olives. Salted popcorn and pretzels. The items listed above may not be a complete list of foods and beverages to avoid. Contact your dietitian for more information. WHERE CAN I FIND MORE INFORMATION? National Heart, Lung, and Blood Institute: travelstabloid.com   This information is not intended to replace advice given to you by your health care provider. Make sure you discuss any questions you have with your health care provider.   Document Released: 03/05/2011 Document Revised: 04/06/2014 Document Reviewed: 01/18/2013 Elsevier Interactive Patient Education 2016 [employer] Atrial Fibrillation Atrial fibrillation is a type of heartbeat that is irregular or fast (rapid). If you  have this condition, your heart keeps quivering in a weird (chaotic) way. This condition can make it so your heart cannot pump blood normally. Having this condition gives a person more risk for stroke, heart failure, and other heart problems. There are different types of atrial fibrillation. Talk with your doctor to learn about the type that you have. HOME CARE  Take over-the-counter and prescription medicines only as told by your doctor.  If your doctor prescribed a blood-thinning medicine, take it exactly as told. Taking too much of it can cause bleeding. If you do not take enough of it, you will not have the protection that you need against stroke and other problems.  Do not use any tobacco products. These include cigarettes, chewing tobacco, and e-cigarettes. If you need help quitting, ask your doctor.  If you have apnea (obstructive sleep apnea), manage it as told by your doctor.  Do not drink alcohol.  Do not drink beverages that have caffeine. These include coffee, soda, and tea.  Maintain a healthy weight. Do not use diet pills unless your doctor says they are safe for you. Diet pills may make heart problems worse.  Follow diet instructions as told by your doctor.  Exercise regularly as told by your doctor.  Keep all follow-up visits as told by your doctor. This is important. GET HELP IF:  You notice a change in the speed, rhythm, or strength of your heartbeat.  You are taking a blood-thinning medicine and you notice more bruising.  You get tired more easily when you move or exercise. GET HELP RIGHT AWAY IF:  You have pain in your chest  or your belly (abdomen).  You have sweating or weakness.  You feel sick to your stomach (nauseous).  You notice blood in your throw up (vomit), poop (stool), or pee (urine).  You are short of breath.  You suddenly have swollen feet and ankles.  You feel dizzy.  Your suddenly get weak or numb in your face, arms, or legs, especially  if it happens on one side of your body.  You have trouble talking, trouble understanding, or both.  Your face or your eyelid droops on one side. These symptoms may be an emergency. Do not wait to see if the symptoms will go away. Get medical help right away. Call your local emergency services (911 in the [place] Do not drive yourself to the hospital.   This information is not intended to replace advice given to you by your health care provider. Make sure you discuss any questions you have with your health care provider.   Document Released: 12/24/2007 Document Revised: 12/05/2014 Document Reviewed: 07/11/2014 Elsevier Interactive Patient Education [employer].

## 2015-05-27 ENCOUNTER — Telehealth: Payer: Self-pay

## 2015-05-27 NOTE — Telephone Encounter (Signed)
Pt called to check the status of her ortho referral. Please call her about this as soon as possible. Thanks.

## 2015-05-28 NOTE — Telephone Encounter (Signed)
Referral has been sent to [facility]. No referral approval required ([place]). Will follow-up on this referral this afternoon.

## 2015-06-06 ENCOUNTER — Other Ambulatory Visit: Payer: Medicare Other

## 2015-06-06 DIAGNOSIS — I10 Essential (primary) hypertension: Secondary | ICD-10-CM

## 2015-06-07 LAB — BASIC METABOLIC PANEL
BUN / CREAT RATIO: 17 (ref 11–26)
BUN: 12 mg/dL (ref 8–27)
CHLORIDE: 102 mmol/L (ref 96–106)
CO2: 23 mmol/L (ref 18–29)
Calcium: 9 mg/dL (ref 8.7–10.3)
Creatinine, Ser: 0.69 mg/dL (ref 0.57–1.00)
GFR calc non Af Amer: 82 mL/min/{1.73_m2} (ref >59)
GFR, EST AFRICAN AMERICAN: 94 mL/min/{1.73_m2} (ref >59)
Glucose: 139 mg/dL — ABNORMAL HIGH (ref 65–99)
POTASSIUM: 4.2 mmol/L (ref 3.5–5.2)
SODIUM: 138 mmol/L (ref 134–144)

## 2015-06-10 ENCOUNTER — Telehealth: Payer: Self-pay | Admitting: Family Medicine

## 2015-06-10 NOTE — Telephone Encounter (Signed)
Patient was supposed to return for BMP and BP but I can't find the BP; please let her know that the BMP at least is great

## 2015-06-10 NOTE — Telephone Encounter (Signed)
No answer

## 2015-06-11 ENCOUNTER — Ambulatory Visit (INDEPENDENT_AMBULATORY_CARE_PROVIDER_SITE_OTHER): Payer: Medicare Other | Admitting: Cardiology

## 2015-06-11 ENCOUNTER — Encounter (INDEPENDENT_AMBULATORY_CARE_PROVIDER_SITE_OTHER): Payer: Self-pay

## 2015-06-11 ENCOUNTER — Encounter: Payer: Self-pay | Admitting: Cardiology

## 2015-06-11 VITALS — BP 160/88 | HR 124 | Ht 62.0 in | Wt 173.0 lb

## 2015-06-11 DIAGNOSIS — I10 Essential (primary) hypertension: Secondary | ICD-10-CM

## 2015-06-11 DIAGNOSIS — E785 Hyperlipidemia, unspecified: Secondary | ICD-10-CM

## 2015-06-11 DIAGNOSIS — I4891 Unspecified atrial fibrillation: Secondary | ICD-10-CM

## 2015-06-11 MED ORDER — METOPROLOL TARTRATE 25 MG PO TABS: 25.0000 mg | ORAL_TABLET | Freq: Two times a day (BID) | ORAL | Status: DC

## 2015-06-11 NOTE — Patient Instructions (Addendum)
Medication Instructions:  Your physician has recommended you make the following change in your medication: Metoprolol 25 mg Twice Daily  Call us if your heart rate is higher than 100.    Labwork: Labs today TSH and Free T4  Testing/Procedures: Your physician has requested that you regularly monitor and record your blood pressure readings at home. Please use the same machine at the same time of day to check your readings and record them to bring to your follow-up visit.    Follow-Up: Your physician recommends that you schedule a follow-up appointment in: 1 week with Dr. Yvone Neu  Date & Time:_______________________________________________________________   Any Other Special Instructions Will Be Listed Below (If Applicable).     If you need a refill on your cardiac medications before your next appointment, please call your pharmacy.

## 2015-06-11 NOTE — Telephone Encounter (Signed)
Patient notified. She will come in tomorrow afternoon for a BP check.

## 2015-06-11 NOTE — Progress Notes (Signed)
Cardiology Office Note   Date:  06/11/2015   ID:  Amber Wolfe, DOB Jun 13, 1931, MRN [id number]  Referring Doctor:  Enid Derry, MD   Cardiologist:   Wende Bushy, MD   Reason for consultation:  Chief Complaint  Patient presents with  . Atrial Fibrillation    NO CHEST PAIN, SOB OR SELLING. NO COMPLAINTS.      History of Present Illness: Amber Wolfe is a 82 y.o. female who presents for Evaluation for atrial fibrillation. This condition was picked up on the routine office visit upon auscultation. An EKG at that time 05/15/2015 showed atrial fibrillation with ventricular rate that was controlled, 80s bpm. She had no complaints of chest pain, shortness breath, palpitations. Patient was appropriately started on anticoagulation. It is unclear how long she's had this condition for.  Again patient does not have any complaints of palpitations, loss of consciousness, chest pain, shortness breath, PND, orthopnea, edema, headache, fever, cough, colds, abdominal pain.   ROS:  Please see the history of present illness. Aside from mentioned under HPI, all other systems are reviewed and negative.     Past Medical History  Diagnosis Date  . Hyperlipidemia   . Hypertension   . GERD (gastroesophageal reflux disease)   . Diabetes mellitus without complication ([place])     Type II  . Arthritis     In hands  . Cataract   . Glaucoma     Right Eye    Past Surgical History  Procedure Laterality Date  . Cataract extraction Bilateral   . Wrist surgery Left   . Cesarean section    . Wisdom tooth extraction       reports that she has never smoked. She has never used smokeless tobacco. She reports that she does not drink alcohol or use illicit drugs.   family history includes Aneurysm in her daughter; Cataracts in her mother; Heart attack in her brother and father.   Current Outpatient Prescriptions  Medication Sig Dispense Refill  . atorvastatin (LIPITOR) 20 MG tablet Take 1  tablet (20 mg total) by mouth at bedtime. 30 tablet 6  . glimepiride (AMARYL) 2 MG tablet Take 1 tablet (2 mg total) by mouth daily with breakfast. 30 tablet 5  . latanoprost (XALATAN) 0.005 % ophthalmic solution Place 1 drop into both eyes at bedtime.     Marland Kitchen lisinopril (PRINIVIL,ZESTRIL) 5 MG tablet Take 1.5 tablets (7.5 mg total) by mouth daily. 45 tablet 6  . omeprazole (PRILOSEC) 20 MG capsule Take 20 mg by mouth daily.     . rivaroxaban (XARELTO) 20 MG TABS tablet Take 1 tablet (20 mg total) by mouth daily with supper. STOP aspirin 30 tablet 5  . timolol (TIMOPTIC) 0.5 % ophthalmic solution Place 1 drop into both eyes daily.     . metoprolol tartrate (LOPRESSOR) 25 MG tablet Take 1 tablet (25 mg total) by mouth 2 (two) times daily. 60 tablet 6   No current facility-administered medications for this visit.    Allergies: Review of patient's allergies indicates no known allergies.    PHYSICAL EXAM: VS:  BP 160/88 mmHg  Pulse 124  Ht 5\' 2"  (1.575 m)  Wt 173 lb (78.472 kg)  BMI 31.63 kg/m2 , Body mass index is 31.63 kg/(m^2). Wt Readings from Last 3 Encounters:  06/11/15 173 lb (78.472 kg)  05/23/15 173 lb 6.4 oz (78.654 kg)  05/20/15 175 lb 9.6 oz (79.652 kg)    GENERAL:  well developed, well nourished, obese,  not in acute distress HEENT: normocephalic, pink conjunctivae, anicteric sclerae, no xanthelasma, normal dentition, oropharynx clear NECK:  no neck vein engorgement, JVP normal, no hepatojugular reflux, carotid upstroke brisk and symmetric, no bruit, no thyromegaly, no lymphadenopathy LUNGS:  good respiratory effort, clear to auscultation bilaterally CV:  PMI not displaced, no thrills, no lifts, S2 within normal limits, no palpable S3 or S4, no murmurs, no rubs, no gallops, irregularly irregular rhythm ABD:  Soft, nontender, nondistended, normoactive bowel sounds, no abdominal aortic bruit, no hepatomegaly, no splenomegaly MS: nontender back, no kyphosis, no scoliosis, no joint  deformities EXT:  2+ DP/PT pulses, no edema, no varicosities, no cyanosis, no clubbing SKIN: warm, nondiaphoretic, normal turgor, no ulcers NEUROPSYCH: alert, oriented to person, place, and time, sensory/motor grossly intact, normal mood, appropriate affect  Recent Labs: 05/15/2015: ALT 7 06/06/2015: BUN 12; Creatinine, Ser 0.69; Potassium 4.2; Sodium 138   Lipid Panel No results found for: CHOL, TRIG, HDL, CHOLHDL, VLDL, LDLCALC, LDLDIRECT   Other studies Reviewed:  EKG:  EKG is ordered today. The ekg ordered 06/11/2015  was personally reviewed by me and it reveals atrial fibrillation, 124 ventricular rate. Nonspecific ST-T wave changes.  Additional studies/ records that were reviewed personally reviewed by me today include: None available.   ASSESSMENT AND PLAN:  Atrial fibrillation, rapid ventricular response Will start metoprolol 25 mg by mouth twice a day. Patient instructed to get a blood pressure monitor. She is completely asymptomatic. She does not recall any episodes of palpitations, chest pain, short of breath. Patient instructed to call our office if despite taking the beta blockers, her heart or distal more than 100 BPM, or she develops symptoms. We will see her in the office in one week. Continue oral anticoagulation with Xarelto 20mg  po qd. On her follow-up, we will then send her for an echocardiogram, Holter monitor, and stress testing to evaluate for ischemia.  Hypertension Blood pressure elevated. Patient to start metoprolol. Blood pressure log recommended. She recommendations above.  Hyperlipidemia continue statin therapy   Current medicines are reviewed at length with the patient today.  The patient does not have concerns regarding medicines.  Labs/ tests ordered today include:  Orders Placed This Encounter  Procedures  . TSH  . T4, free  . EKG 12-Lead    I had a lengthy and detailed discussion with the patient regarding diagnoses, prognosis, diagnostic  options, treatment options, and side effects of medications.   I counseled the patient on importance of lifestyle modification including heart healthy diet, regular physical activity.  I spent at least 45 minutes with the patient today and more than 50% of the time was spent counseling the patient and coordinating care.   Disposition:   FU with undersigned  In one week   Signed, Wende Bushy, MD  06/11/2015 3:42 PM    [place]

## 2015-06-12 ENCOUNTER — Ambulatory Visit (INDEPENDENT_AMBULATORY_CARE_PROVIDER_SITE_OTHER): Payer: Medicare Other

## 2015-06-12 VITALS — BP 149/73 | HR 89

## 2015-06-12 DIAGNOSIS — Z0130 Encounter for examination of blood pressure without abnormal findings: Secondary | ICD-10-CM

## 2015-06-12 DIAGNOSIS — Z136 Encounter for screening for cardiovascular disorders: Secondary | ICD-10-CM

## 2015-06-12 LAB — T4, FREE: Free T4: 1.26 ng/dL (ref 0.82–1.77)

## 2015-06-12 LAB — TSH: TSH: 2.38 u[IU]/mL (ref 0.450–4.500)

## 2015-06-12 NOTE — Patient Instructions (Signed)
Patient states no problems

## 2015-06-17 DIAGNOSIS — M170 Bilateral primary osteoarthritis of knee: Secondary | ICD-10-CM | POA: Diagnosis not present

## 2015-06-18 ENCOUNTER — Ambulatory Visit (INDEPENDENT_AMBULATORY_CARE_PROVIDER_SITE_OTHER): Payer: Medicare Other | Admitting: Cardiology

## 2015-06-18 ENCOUNTER — Encounter: Payer: Self-pay | Admitting: Cardiology

## 2015-06-18 VITALS — BP 148/84 | HR 79 | Ht 62.0 in | Wt 173.1 lb

## 2015-06-18 DIAGNOSIS — I484 Atypical atrial flutter: Secondary | ICD-10-CM | POA: Diagnosis not present

## 2015-06-18 DIAGNOSIS — I4891 Unspecified atrial fibrillation: Secondary | ICD-10-CM | POA: Diagnosis not present

## 2015-06-18 DIAGNOSIS — I10 Essential (primary) hypertension: Secondary | ICD-10-CM

## 2015-06-18 DIAGNOSIS — E785 Hyperlipidemia, unspecified: Secondary | ICD-10-CM | POA: Diagnosis not present

## 2015-06-18 NOTE — Patient Instructions (Addendum)
Medication Instructions:  Your physician recommends that you continue on your current medications as directed. Please refer to the Current Medication list given to you today.   Labwork: None ordered  Testing/Procedures: Your physician has requested that you have an echocardiogram. Echocardiography is a painless test that uses sound waves to create images of your heart. It provides your doctor with information about the size and shape of your heart and how well your heart's chambers and valves are working. This procedure takes approximately one hour. There are no restrictions for this procedure.  Date & Time: _________________________________________________________________  Your physician has recommended that you wear a holter monitor. Holter monitors are medical devices that record the heart's electrical activity. Doctors most often use these monitors to diagnose arrhythmias. Arrhythmias are problems with the speed or rhythm of the heartbeat. The monitor is a small, portable device. You can wear one while you do your normal daily activities. This is usually used to diagnose what is causing palpitations/syncope (passing out).  Date & Time: ___________________________________________________________________  Your physician has requested that you have a lexiscan myoview. For further information please visit [email or web address]. Please follow instruction sheet, as given.  Date & Time: _________________________________________________________________  Follow-Up: Your physician recommends that you schedule a follow-up appointment after testing to review results.  Date & Time: ___________________________________________________________________   Any Other Special Instructions Will Be Listed Below (If Applicable).    [place]  Your caregiver has ordered a Stress Test with nuclear imaging. The purpose of this test is to evaluate the blood supply to your heart muscle. This procedure is  referred to as a "Non-Invasive Stress Test." This is because other than having an IV started in your vein, nothing is inserted or "invades" your body. Cardiac stress tests are done to find areas of poor blood flow to the heart by determining the extent of coronary artery disease (CAD). Some patients exercise on a treadmill, which naturally increases the blood flow to your heart, while others who are  unable to walk on a treadmill due to physical limitations have a pharmacologic/chemical stress agent called Lexiscan . This medicine will mimic walking on a treadmill by temporarily increasing your coronary blood flow.   Please note: these test may take anywhere between 2-4 hours to complete  PLEASE REPORT TO [facility] AT THE FIRST DESK WILL DIRECT YOU WHERE TO GO  Date of Procedure:____March 24, 2017 at 07:30 AM__________  Arrival Time for Procedure:__Arrive at 07:15 AM to register______  Instructions regarding medication:   __X__ : Hold diabetes medication morning of procedure  _X___:  Hold betablocker(s) night before procedure and morning of procedure  Hold lisinopril and Metoprolol the night before and morning of procedure.   PLEASE NOTIFY THE OFFICE AT LEAST 34 HOURS IN ADVANCE IF YOU ARE UNABLE TO KEEP YOUR APPOINTMENT.  [phone] AND  PLEASE NOTIFY NUCLEAR MEDICINE AT [facility] AT LEAST 24 HOURS IN ADVANCE IF YOU ARE UNABLE TO KEEP YOUR APPOINTMENT. [phone]  How to prepare for your Myoview test:   Do not eat or drink after midnight  No caffeine for 24 hours prior to test  No smoking 24 hours prior to test.  Your medication may be taken with water.  If your doctor stopped a medication because of this test, do not take that medication.  Ladies, please do not wear dresses.  Skirts or pants are appropriate. Please wear a short sleeve shirt.  No perfume, cologne or lotion.  Wear comfortable walking shoes.  No heels!             If you  need a refill on your cardiac medications before your next appointment, please call your pharmacy.    Echocardiogram An echocardiogram, or echocardiography, uses sound waves (ultrasound) to produce an image of your heart. The echocardiogram is simple, painless, obtained within a short period of time, and offers valuable information to your health care provider. The images from an echocardiogram can provide information such as:  Evidence of coronary artery disease (CAD).  Heart size.  Heart muscle function.  Heart valve function.  Aneurysm detection.  Evidence of a past heart attack.  Fluid buildup around the heart.  Heart muscle thickening.  Assess heart valve function. LET [facility] CARE PROVIDER KNOW ABOUT:  Any allergies you have.  All medicines you are taking, including vitamins, herbs, eye drops, creams, and over-the-counter medicines.  Previous problems you or members of your family have had with the use of anesthetics.  Any blood disorders you have.  Previous surgeries you have had.  Medical conditions you have.  Possibility of pregnancy, if this applies. BEFORE THE PROCEDURE  No special preparation is needed. Eat and drink normally.  PROCEDURE   In order to produce an image of your heart, gel will be applied to your chest and a wand-like tool (transducer) will be moved over your chest. The gel will help transmit the sound waves from the transducer. The sound waves will harmlessly bounce off your heart to allow the heart images to be captured in real-time motion. These images will then be recorded.  You may need an IV to receive a medicine that improves the quality of the pictures. AFTER THE PROCEDURE You may return to your normal schedule including diet, activities, and medicines, unless your health care provider tells you otherwise.   This information is not intended to replace advice given to you by your health care provider. Make sure you discuss any  questions you have with your health care provider.   Document Released: 03/13/2000 Document Revised: 04/06/2014 Document Reviewed: 11/21/2012 Elsevier Interactive Patient Education 2016 [employer]     Holter Monitoring A Holter monitor is a small device that is used to detect abnormal heart rhythms. It clips to your clothing and is connected by wires to flat, sticky disks (electrodes) that attach to your chest. It is worn continuously for 24-48 hours. HOME CARE INSTRUCTIONS  Wear your Holter monitor at all times, even while exercising and sleeping, for as long as directed by your health care provider.  Make sure that the Holter monitor is safely clipped to your clothing or close to your body as recommended by your health care provider.  Do not get the monitor or wires wet.  Do not put body lotion or moisturizer on your chest.  Keep your skin clean.  Keep a diary of your daily activities, such as walking and doing chores. If you feel that your heartbeat is abnormal or that your heart is fluttering or skipping a beat:  Record what you are doing when it happens.  Record what time of day the symptoms occur.  Return your Holter monitor as directed by your health care provider.  Keep all follow-up visits as directed by your health care provider. This is important. SEEK IMMEDIATE MEDICAL CARE IF:  You feel lightheaded or you faint.  You have trouble breathing.  You feel pain in your chest, upper arm, or jaw.  You feel sick to your stomach and  your skin is pale, cool, or damp.  You heartbeat feels unusual or abnormal.   This information is not intended to replace advice given to you by your health care provider. Make sure you discuss any questions you have with your health care provider.   Document Released: 12/13/2003 Document Revised: 04/06/2014 Document Reviewed: 10/23/2013 Elsevier Interactive Patient Education 2016 [employer].     Pharmacologic Stress  Electrocardiogram A pharmacologic stress electrocardiogram is a heart (cardiac) test that uses nuclear imaging to evaluate the blood supply to your heart. This test may also be called a pharmacologic stress electrocardiography. Pharmacologic means that a medicine is used to increase your heart rate and blood pressure.  This stress test is done to find areas of poor blood flow to the heart by determining the extent of coronary artery disease (CAD). Some people exercise on a treadmill, which naturally increases the blood flow to the heart. For those people unable to exercise on a treadmill, a medicine is used. This medicine stimulates your heart and will cause your heart to beat harder and more quickly, as if you were exercising.  Pharmacologic stress tests can help determine:  The adequacy of blood flow to your heart during increased levels of activity in order to clear you for discharge home.  The extent of coronary artery blockage caused by CAD.  Your prognosis if you have suffered a heart attack.  The effectiveness of cardiac procedures done, such as an angioplasty, which can increase the circulation in your coronary arteries.  Causes of chest pain or pressure. LET [facility] CARE PROVIDER KNOW ABOUT:  Any allergies you have.  All medicines you are taking, including vitamins, herbs, eye drops, creams, and over-the-counter medicines.  Previous problems you or members of your family have had with the use of anesthetics.  Any blood disorders you have.  Previous surgeries you have had.  Medical conditions you have.  Possibility of pregnancy, if this applies.  If you are currently breastfeeding. RISKS AND COMPLICATIONS Generally, this is a safe procedure. However, as with any procedure, complications can occur. Possible complications include:  You develop pain or pressure in the following areas:  Chest.  Jaw or neck.  Between your shoulder blades.  Radiating down your left  arm.  Headache.  Dizziness or light-headedness.  Shortness of breath.  Increased or irregular heartbeat.  Low blood pressure.  Nausea or vomiting.  Flushing.  Redness going up the arm and slight pain during injection of medicine.  Heart attack (rare). BEFORE THE PROCEDURE   Avoid all forms of caffeine for 24 hours before your test or as directed by your health care provider. This includes coffee, tea (even decaffeinated tea), caffeinated sodas, chocolate, cocoa, and certain pain medicines.  Follow your health care provider's instructions regarding eating and drinking before the test.  Take your medicines as directed at regular times with water unless instructed otherwise. Exceptions may include:  If you have diabetes, ask how you are to take your insulin or pills. It is common to adjust insulin dosing the morning of the test.  If you are taking beta-blocker medicines, it is important to talk to your health care provider about these medicines well before the date of your test. Taking beta-blocker medicines may interfere with the test. In some cases, these medicines need to be changed or stopped 24 hours or more before the test.  If you wear a nitroglycerin patch, it may need to be removed prior to the test. Ask your health  care provider if the patch should be removed before the test.  If you use an inhaler for any breathing condition, bring it with you to the test.  If you are an outpatient, bring a snack so you can eat right after the stress phase of the test.  Do not smoke for 4 hours prior to the test or as directed by your health care provider.  Do not apply lotions, powders, creams, or oils on your chest prior to the test.  Wear comfortable shoes and clothing. Let your health care provider know if you were unable to complete or follow the preparations for your test. PROCEDURE   Multiple patches (electrodes) will be put on your chest. If needed, small areas of your  chest may be shaved to get better contact with the electrodes. Once the electrodes are attached to your body, multiple wires will be attached to the electrodes, and your heart rate will be monitored.  An IV access will be started. A nuclear trace (isotope) is given. The isotope may be given intravenously, or it may be swallowed. Nuclear refers to several types of radioactive isotopes, and the nuclear isotope lights up the arteries so that the nuclear images are clear. The isotope is absorbed by your body. This results in low radiation exposure.  A resting nuclear image is taken to show how your heart functions at rest.  A medicine is given through the IV access.  A second scan is done about 1 hour after the medicine injection and determines how your heart functions under stress.  During this stress phase, you will be connected to an electrocardiogram machine. Your blood pressure and oxygen levels will be monitored. AFTER THE PROCEDURE   Your heart rate and blood pressure will be monitored after the test.  You may return to your normal schedule, including diet,activities, and medicines, unless your health care provider tells you otherwise.   This information is not intended to replace advice given to you by your health care provider. Make sure you discuss any questions you have with your health care provider.   Document Released: 08/02/2008 Document Revised: 03/21/2013 Document Reviewed: 11/21/2012 Elsevier Interactive Patient Education [employer].

## 2015-06-18 NOTE — Progress Notes (Signed)
Cardiology Office Note   Date:  06/18/2015   ID:  Amber Wolfe, DOB [birth year], MRN [id number]  Referring Doctor:  Enid Derry, MD   Cardiologist:   Wende Bushy, MD   Reason for consultation:  Chief Complaint  Patient presents with  . Follow-up    no complaints. no chest pain, sob or swelling   Follow-up for atrial fibrillation   History of Present Illness: Amber Wolfe is a 82 y.o. female who presents for follow-up for atrial fibrillation. On her last visit, patient was started on metoprolol for heart rate control. Based on her blood pressure/heart rate log, average heart rate is less than 100 bpm since metoprolol was started. She does not report any symptoms of palpitations. Patient continues to take her Xarelto.  In terms of her hypertension, blood pressure overall within the normal range based on her log. There was one reading that was very high but she wasn't sure if the monitor was measuring her numbers correctly.  In terms of her hyperlipidemia, she continues to take her atorvastatin.  Patient does not have any complaints of palpitations, loss of consciousness, chest pain, shortness breath, PND, orthopnea, edema, headache, fever, cough, colds, abdominal pain.   ROS:  Please see the history of present illness. Aside from mentioned under HPI, all other systems are reviewed and negative.     Past Medical History  Diagnosis Date  . Hyperlipidemia   . Hypertension   . GERD (gastroesophageal reflux disease)   . Diabetes mellitus without complication ([place])     Type II  . Arthritis     In hands  . Cataract   . Glaucoma     Right Eye    Past Surgical History  Procedure Laterality Date  . Cataract extraction Bilateral   . Wrist surgery Left   . Cesarean section    . Wisdom tooth extraction       reports that she has never smoked. She has never used smokeless tobacco. She reports that she does not drink alcohol or use illicit drugs.   family history  includes Aneurysm in her daughter; Cataracts in her mother; Heart attack in her brother and father.   Current Outpatient Prescriptions  Medication Sig Dispense Refill  . atorvastatin (LIPITOR) 20 MG tablet Take 1 tablet (20 mg total) by mouth at bedtime. 30 tablet 6  . glimepiride (AMARYL) 2 MG tablet Take 1 tablet (2 mg total) by mouth daily with breakfast. 30 tablet 5  . latanoprost (XALATAN) 0.005 % ophthalmic solution Place 1 drop into both eyes at bedtime.     Marland Kitchen lisinopril (PRINIVIL,ZESTRIL) 5 MG tablet Take 1.5 tablets (7.5 mg total) by mouth daily. 45 tablet 6  . metoprolol tartrate (LOPRESSOR) 25 MG tablet Take 1 tablet (25 mg total) by mouth 2 (two) times daily. 60 tablet 6  . omeprazole (PRILOSEC) 20 MG capsule Take 20 mg by mouth daily.     . rivaroxaban (XARELTO) 20 MG TABS tablet Take 1 tablet (20 mg total) by mouth daily with supper. STOP aspirin 30 tablet 5  . timolol (TIMOPTIC) 0.5 % ophthalmic solution Place 1 drop into both eyes daily.      No current facility-administered medications for this visit.    Allergies: Review of patient's allergies indicates no known allergies.    PHYSICAL EXAM: VS:  BP 148/84 mmHg  Pulse 79  Ht 5\' 2"  (1.575 m)  Wt 173 lb 1.9 oz (78.527 kg)  BMI 31.66 kg/m2 , Body  mass index is 31.66 kg/(m^2). Wt Readings from Last 3 Encounters:  06/18/15 173 lb 1.9 oz (78.527 kg)  06/11/15 173 lb (78.472 kg)  05/23/15 173 lb 6.4 oz (78.654 kg)    GENERAL:  well developed, well nourished, obese, not in acute distress HEENT: normocephalic, pink conjunctivae, anicteric sclerae, no xanthelasma, normal dentition, oropharynx clear NECK:  no neck vein engorgement, JVP normal, no hepatojugular reflux, carotid upstroke brisk and symmetric, no bruit, no thyromegaly, no lymphadenopathy LUNGS:  good respiratory effort, clear to auscultation bilaterally CV:  PMI not displaced, no thrills, no lifts, S2 within normal limits, no palpable S3 or S4, no murmurs, no rubs,  no gallops, irregularly irregular rhythm ABD:  Soft, nontender, nondistended, normoactive bowel sounds, no abdominal aortic bruit, no hepatomegaly, no splenomegaly MS: nontender back, no kyphosis, no scoliosis, no joint deformities EXT:  2+ DP/PT pulses, no edema, no varicosities, no cyanosis, no clubbing SKIN: warm, nondiaphoretic, normal turgor, no ulcers NEUROPSYCH: alert, oriented to person, place, and time, sensory/motor grossly intact, normal mood, appropriate affect  Recent Labs: 05/15/2015: ALT 7 06/06/2015: BUN 12; Creatinine, Ser 0.69; Potassium 4.2; Sodium 138 06/11/2015: TSH 2.380   Lipid Panel No results found for: CHOL, TRIG, HDL, CHOLHDL, VLDL, LDLCALC, LDLDIRECT   Other studies Reviewed:  EKG:  EKG is ordered today. The ekg ordered 06/18/2015  was personally reviewed by me and it reveals atrial flutter, with variable AV block, ventricular rate of 79 BPM.  EKG from 06/11/2015 showed atrial fibrillation, 124 ventricular rate. Nonspecific ST-T wave changes.   EKG from 04/20/2014 showed sinus rhythm.   Additional studies/ records that were reviewed personally reviewed by me today include: None available.   ASSESSMENT AND PLAN:  Atrial fibrillation, possibly paroxysmal Atrial flutter, likely atypical, ventricular rate controlled on low-dose metoprolol Patient denies symptoms of chest pain, palpitations, shortness of breath. Patient is likely not very physically active. Continue current metoprolol dose for now.  Continue oral anticoagulation with Xarelto 20mg  po qd. Recommend echocardiogram, 24 hour Holter monitor, and stress testing to evaluate for ischemia. Based on her heart rate being easily controlled with low dose beta blocker therapy, she may have underlying AV conduction disease on top of her atrial arrhythmia. She may benefit eventually from EP consultation.  Hypertension Blood pressure improved. Patient to continue metoprolol. Blood pressure log recommended.    Hyperlipidemia continue statin therapy   Current medicines are reviewed at length with the patient today.  The patient does not have concerns regarding medicines.  Labs/ tests ordered today include:  Orders Placed This Encounter  Procedures  . NM Myocar Multi W/Spect W/Wall Motion / EF  . Holter monitor - 24 hour  . EKG 12-Lead  . Echocardiogram    I had a lengthy and detailed discussion with the patient regarding diagnoses, prognosis, diagnostic options, treatment options, and side effects of medications.   I counseled the patient on importance of lifestyle modification including heart healthy diet, regular physical activity.   Disposition:   FU with undersigned after tests  Signed, Wende Bushy, MD  06/18/2015 11:24 AM    [place]

## 2015-06-19 ENCOUNTER — Telehealth: Payer: Self-pay | Admitting: Cardiology

## 2015-06-19 NOTE — Telephone Encounter (Signed)
Pt has a NM stress test on Friday 3/24, and has some questions regarding her medications. Please call

## 2015-06-19 NOTE — Telephone Encounter (Signed)
No answer or voicemail on phone. Will try again later.

## 2015-06-20 NOTE — Telephone Encounter (Signed)
Patient just wanted to verify what medications she needed to hold for her stress test tomorrow. Reviewed instructions with her and she had no further questions at this time. She verbalized understanding of all information we reviewed on medications, preparing for her test tomorrow, and follow up information.

## 2015-06-21 ENCOUNTER — Encounter
Admission: RE | Admit: 2015-06-21 | Discharge: 2015-06-21 | Disposition: A | Discharge status: Home / Self Care | Payer: Medicare Other | Source: Ambulatory Visit | Attending: Cardiology | Admitting: Cardiology

## 2015-06-21 DIAGNOSIS — I4891 Unspecified atrial fibrillation: Secondary | ICD-10-CM | POA: Diagnosis not present

## 2015-06-21 MED ORDER — REGADENOSON 0.4 MG/5ML IV SOLN
0.4000 mg | Freq: Once | INTRAVENOUS | Status: AC
  Administered 2015-06-21: 0.4 mg via INTRAVENOUS
  Filled 2015-06-21: qty 5

## 2015-06-21 MED ORDER — TECHNETIUM TC 99M SESTAMIBI - CARDIOLITE
12.8100 | Freq: Once | INTRAVENOUS | Status: AC | PRN
  Administered 2015-06-21: 12.81 via INTRAVENOUS

## 2015-06-21 MED ORDER — TECHNETIUM TC 99M SESTAMIBI GENERIC - CARDIOLITE
31.7600 | Freq: Once | INTRAVENOUS | Status: AC | PRN
Start: 2015-06-21 — End: 2015-06-21
  Administered 2015-06-21: 31.76 via INTRAVENOUS

## 2015-06-24 LAB — NM MYOCAR MULTI W/SPECT W/WALL MOTION / EF
CHL CUP NUCLEAR SDS: 1
CHL CUP NUCLEAR SRS: 0
CHL CUP NUCLEAR SSS: 1
CHL CUP RESTING HR STRESS: 95 {beats}/min
CHL CUP STRESS STAGE 2 GRADE: 0 %
CHL CUP STRESS STAGE 2 SPEED: 0 mph
CHL CUP STRESS STAGE 3 HR: 121 {beats}/min
CHL CUP STRESS STAGE 3 SPEED: 0 mph
CHL CUP STRESS STAGE 4 DBP: 61 mmHg
CHL CUP STRESS STAGE 4 SBP: 141 mmHg
CHL CUP STRESS STAGE 5 GRADE: 0 %
CHL CUP STRESS STAGE 5 SPEED: 0 mph
CSEPHR: 96 %
CSEPPHR: 121 {beats}/min
Estimated workload: 1 METS
LV dias vol: 51 mL (ref 46–106)
LV sys vol: 23 mL
NUC STRESS TID: 1.15
Percent of predicted max HR: 87 %
Stage 1 HR: 101 {beats}/min
Stage 2 HR: 99 {beats}/min
Stage 3 Grade: 0 %
Stage 4 Grade: 0 %
Stage 4 HR: 120 {beats}/min
Stage 4 Speed: 0 mph
Stage 5 DBP: 68 mmHg
Stage 5 HR: 109 {beats}/min
Stage 5 SBP: 153 mmHg

## 2015-07-10 ENCOUNTER — Ambulatory Visit (INDEPENDENT_AMBULATORY_CARE_PROVIDER_SITE_OTHER): Payer: Medicare Other

## 2015-07-10 ENCOUNTER — Other Ambulatory Visit: Payer: Self-pay

## 2015-07-10 DIAGNOSIS — I4891 Unspecified atrial fibrillation: Secondary | ICD-10-CM | POA: Diagnosis not present

## 2015-07-16 ENCOUNTER — Ambulatory Visit
Admission: RE | Admit: 2015-07-16 | Discharge: 2015-07-16 | Disposition: A | Discharge status: Home / Self Care | Payer: Medicare Other | Source: Ambulatory Visit | Attending: Cardiology | Admitting: Cardiology

## 2015-07-16 DIAGNOSIS — I4891 Unspecified atrial fibrillation: Secondary | ICD-10-CM | POA: Insufficient documentation

## 2015-07-22 ENCOUNTER — Ambulatory Visit (INDEPENDENT_AMBULATORY_CARE_PROVIDER_SITE_OTHER): Payer: Medicare Other | Admitting: Cardiology

## 2015-07-22 ENCOUNTER — Encounter (INDEPENDENT_AMBULATORY_CARE_PROVIDER_SITE_OTHER): Payer: Self-pay

## 2015-07-22 ENCOUNTER — Other Ambulatory Visit: Payer: Self-pay | Admitting: Cardiology

## 2015-07-22 ENCOUNTER — Encounter: Payer: Self-pay | Admitting: Cardiology

## 2015-07-22 VITALS — BP 160/90 | HR 111 | Ht 66.0 in | Wt 171.0 lb

## 2015-07-22 DIAGNOSIS — I484 Atypical atrial flutter: Secondary | ICD-10-CM

## 2015-07-22 DIAGNOSIS — E785 Hyperlipidemia, unspecified: Secondary | ICD-10-CM | POA: Diagnosis not present

## 2015-07-22 DIAGNOSIS — I4891 Unspecified atrial fibrillation: Secondary | ICD-10-CM | POA: Diagnosis not present

## 2015-07-22 DIAGNOSIS — Z01818 Encounter for other preprocedural examination: Secondary | ICD-10-CM

## 2015-07-22 DIAGNOSIS — I10 Essential (primary) hypertension: Secondary | ICD-10-CM

## 2015-07-22 NOTE — Progress Notes (Signed)
Cardiology Office Note   Date:  07/22/2015   ID:  Devani Odonnel, DOB [birth year], MRN [id number]  Referring Doctor:  Enid Derry, MD   Cardiologist:   Wende Bushy, MD   Reason for consultation:  Chief Complaint  Patient presents with  . other    F/u echo and monitor. Meds reviewed verbally with pt.   Follow-up for atrial fibrillation   History of Present Illness: Amber Wolfe is a 82 y.o. female who presents for follow-up for atrial fibrillation. Patient does not report any symptoms of palpitations. Patient continues to take her Xarelto.  In terms of her hypertension, blood pressure overall within the normal range.   In terms of her hyperlipidemia, she continues to take her atorvastatin.  Patient does not have any complaints of palpitations, loss of consciousness, chest pain, shortness breath, PND, orthopnea, edema, headache, fever, cough, colds, abdominal pain.   ROS:  Please see the history of present illness. Aside from mentioned under HPI, all other systems are reviewed and negative.     Past Medical History  Diagnosis Date  . Hyperlipidemia   . Hypertension   . GERD (gastroesophageal reflux disease)   . Diabetes mellitus without complication ([place])     Type II  . Arthritis     In hands  . Cataract   . Glaucoma     Right Eye    Past Surgical History  Procedure Laterality Date  . Cataract extraction Bilateral   . Wrist surgery Left   . Cesarean section    . Wisdom tooth extraction       reports that she has never smoked. She has never used smokeless tobacco. She reports that she does not drink alcohol or use illicit drugs.   family history includes Aneurysm in her daughter; Cataracts in her mother; Heart attack in her brother and father.   Current Outpatient Prescriptions  Medication Sig Dispense Refill  . atorvastatin (LIPITOR) 20 MG tablet Take 1 tablet (20 mg total) by mouth at bedtime. 30 tablet 6  . glimepiride (AMARYL) 2 MG tablet  Take 1 tablet (2 mg total) by mouth daily with breakfast. 30 tablet 5  . latanoprost (XALATAN) 0.005 % ophthalmic solution Place 1 drop into both eyes at bedtime.     Marland Kitchen lisinopril (PRINIVIL,ZESTRIL) 5 MG tablet Take 1.5 tablets (7.5 mg total) by mouth daily. 45 tablet 6  . metoprolol tartrate (LOPRESSOR) 25 MG tablet Take 1 tablet (25 mg total) by mouth 2 (two) times daily. 60 tablet 6  . omeprazole (PRILOSEC) 20 MG capsule Take 20 mg by mouth daily.     . rivaroxaban (XARELTO) 20 MG TABS tablet Take 1 tablet (20 mg total) by mouth daily with supper. STOP aspirin 30 tablet 5  . timolol (TIMOPTIC) 0.5 % ophthalmic solution Place 1 drop into both eyes daily.      No current facility-administered medications for this visit.    Allergies: Review of patient's allergies indicates no known allergies.    PHYSICAL EXAM: VS:  BP 160/90 mmHg  Pulse 111  Ht 5' 6"  (1.676 m)  Wt 171 lb (77.565 kg)  BMI 27.61 kg/m2 , Body mass index is 27.61 kg/(m^2). Wt Readings from Last 3 Encounters:  07/22/15 171 lb (77.565 kg)  06/18/15 173 lb 1.9 oz (78.527 kg)  06/11/15 173 lb (78.472 kg)    GENERAL:  well developed, well nourished, obese, not in acute distress HEENT: normocephalic, pink conjunctivae, anicteric sclerae, no xanthelasma, normal dentition, oropharynx  clear NECK:  no neck vein engorgement, JVP normal, no hepatojugular reflux, carotid upstroke brisk and symmetric, no bruit, no thyromegaly, no lymphadenopathy LUNGS:  good respiratory effort, clear to auscultation bilaterally CV:  PMI not displaced, no thrills, no lifts, S2 within normal limits, no palpable S3 or S4, no murmurs, no rubs, no gallops, irregularly irregular rhythm ABD:  Soft, nontender, nondistended, normoactive bowel sounds, no abdominal aortic bruit, no hepatomegaly, no splenomegaly MS: nontender back, no kyphosis, no scoliosis, no joint deformities EXT:  2+ DP/PT pulses, no edema, no varicosities, no cyanosis, no clubbing SKIN:  warm, nondiaphoretic, normal turgor, no ulcers NEUROPSYCH: alert, oriented to person, place, and time, sensory/motor grossly intact, normal mood, appropriate affect  Recent Labs: 05/15/2015: ALT 7 06/06/2015: BUN 12; Creatinine, Ser 0.69; Potassium 4.2; Sodium 138 06/11/2015: TSH 2.380   Lipid Panel No results found for: CHOL, TRIG, HDL, CHOLHDL, VLDL, LDLCALC, LDLDIRECT   Other studies Reviewed:  EKG:  EKG from 07/22/2015 was personally reviewed by me and it showed atrial flutter with variable conduction, ventricular rate of 111 bpm. Nonspecific ST-T wave changes.  The ekg from 06/18/2015  was personally reviewed by me and it reveals atrial flutter, with variable AV block, ventricular rate of 79 BPM.  EKG from 06/11/2015 showed atrial fibrillation, 124 ventricular rate. Nonspecific ST-T wave changes.  EKG from 04/20/2014 showed sinus rhythm.  Additional studies/ records that were reviewed personally reviewed by me today include:  Echocardiogram 07/10/2015: Left ventricle: The cavity size was normal. Wall thickness was  normal. The estimated ejection fraction was approximately 40%.  There is hypokinesis in the basal segments, normal wall motion in  the mid and apical segments. - Mitral valve: Calcified annulus. There was mild to moderate  regurgitation. - Left atrium: The atrium was moderately dilated. - Right atrium: The atrium was moderately dilated. - Tricuspid valve: There was mild-moderate regurgitation.  Holter 07/10/2015: Overall rhythm appears to be atrial flutter. Average heart rate of 78 BPM.  Minimum heart rate of 47 bpm at 4:53 AM 07/11/2015. 3% of total number of beats were in bradycardia. Maximum heart rate of 189 bpm at 10:19 AM of 07/10/2015. 11% of the total number of beats were in tachycardia.  Pharmacologic nuclear stress test 06/21/2015: Pharmacological myocardial perfusion imaging study with no significant ischemia Normal wall motion, EF estimated at  60% No EKG changes concerning for ischemia at peak stress or in recovery. Baseline EKG with atrial fibrillation. Rare PVC noted. Low risk scan  ASSESSMENT AND PLAN:  Atrial flutter, as noted on Holter monitor. Average of 78 BPM. Maximum heart rate of 189 BPM, 11% of total number of beats in tachycardia. Atrial fibrillation  Patient denies symptoms of chest pain, palpitations, shortness of breath. Patient is likely not very physically active. Continue current metoprolol dose for now.  Continue oral anticoagulation with Xarelto 42m po qd. Due to presence of persistent atrial flutter on the Holter, as well as very high maximum heart rate of 189 BPM, together with minimum heart rate of 47 BPM, discussed in detail possibility of doing TEE/cardioversion. We had a lengthy discussion about the benefits of restoring normal rhythm, and risks including but not limited to less than 1% chance of major complications  (Esophageal perforation, GI bleed, MI, CVA, death). Patient does not report any dysphagia, odynophagia, history of GI bleed, history of radiation to neck and chest. Patient verbalized understanding of risks and benefits, and would like to proceed with TEE cardioversion. Also discussed with the patient possible outcomes  of the procedure: Restoring normal rhythm and staying in sinus rhythm, converting to sinus rhythm and then reverting back to arrhythmia, etc. Also discussed the possibility of eventually needing EP consultation.  Will obtain labs CBC, CMP, PT INR and PTT. Patient will be nothing by mouth after midnight before the planned procedure. She will continue to take her Xarelto. Discussed the findings of the echocardiogram as well as stress test.  Hypertension Blood pressure improved. Patient to continue metoprolol. Blood pressure log recommended.   Hyperlipidemia continue statin therapy  Current medicines are reviewed at length with the patient today.  The patient does not have  concerns regarding medicines.  Labs/ tests ordered today include:  Orders Placed This Encounter  Procedures  . Comp Met (CMET)  . INR/PT  . CBC With Differential/Platelet  . EKG 12-Lead    I had a lengthy and detailed discussion with the patient regarding diagnoses, prognosis, diagnostic options, treatment options, and side effects of medications.   I counseled the patient on importance of lifestyle modification including heart healthy diet, regular physical activity.   Disposition:   FU with undersigned One week after TEE cardioversion.  I spent at least 40 minutes with the patient today and more than 50% of the time was spent counseling the patient and coordinating care.    Signed, Wende Bushy, MD  07/22/2015 10:19 AM    [place]

## 2015-07-22 NOTE — Patient Instructions (Addendum)
Medication Instructions:  Hold your Lopressor (Metoprolol) for 24 hours before procedure. Do not hold or stop your xarelto. Hold your glimepiride the morning of your procedure.   Labwork: Labs today were CMP, CBC, and PT/INR.  Testing/Procedures: Your physician has requested that you have a TEE/Cardioversion. During a TEE, sound waves are used to create images of your heart. It provides your doctor with information about the size and shape of your heart and how well your heart's chambers and valves are working. In this test, a transducer is attached to the end of a flexible tube that is guided down you throat and into your esophagus (the tube leading from your mouth to your stomach) to get a more detailed image of your heart. Once the TEE has determined that a blood clot is not present, the cardioversion begins. Electrical Cardioversion uses a jolt of electricity to your heart either through paddles or wired patches attached to your chest. This is a controlled, usually prescheduled, procedure. This procedure is done at the hospital and you are not awake during the procedure. You usually go home the day of the procedure. Please see the instruction sheet given to you today for more information.  You are scheduled for a Cardioversion on Jul 31, 2015 07:30AM __ with Dr._Ingal___ Please arrive at the [facility] at _06:30AM_ a.m. on the day of your procedure.  DIET INSTRUCTIONS:  Nothing to eat or drink after midnight except your medications with a sip of water.         1) Labs: _Done CMP, CBC, PT/INR______  2) Medications:  YOU MAY TAKE ALL of your remaining medications with a small amount of water. HOLD your METOPROLOL 24 HOURS BEFORE AND HOLD GLIMEPIRIDE THE MORNING OF YOUR TEST.   3) Must have a responsible person to drive you home.  4) Bring a current list of your medications and current insurance cards.    If you have any questions after you get home, please call the office at  [phone]    Follow-Up: Your physician recommends that you schedule a follow-up appointment in: 2 weeks with Dr. Yvone Neu  Date & Time:________________________________________________________   Any Other Special Instructions Will Be Listed Below (If Applicable).     If you need a refill on your cardiac medications before your next appointment, please call your pharmacy.  Transesophageal Echocardiogram Transesophageal echocardiography (TEE) is a picture test of your heart using sound waves. The pictures taken can give very detailed pictures of your heart. This can help your doctor see if there are problems with your heart. TEE can check:  If your heart has blood clots in it.  How well your heart valves are working.  If you have an infection on the inside of your heart.  Some of the major arteries of your heart.  If your heart valve is working after a Office manager.  Your heart before a procedure that uses a shock to your heart to get the rhythm back to normal. BEFORE THE PROCEDURE  Do not eat or drink for 6 hours before the procedure or as told by your doctor.  Make plans to have someone drive you home after the procedure. Do not drive yourself home.  An IV tube will be put in your arm. PROCEDURE  You will be given a medicine to help you relax (sedative). It will be given through the IV tube.  A numbing medicine will be sprayed or gargled in the back of your throat to help  numb it.  The tip of the probe is placed into the back of your mouth. You will be asked to swallow. This helps to pass the probe into your esophagus.  Once the tip of the probe is in the right place, your doctor can take pictures of your heart.  You may feel pressure at the back of your throat. AFTER THE PROCEDURE  You will be taken to a recovery area so the sedative can wear off.  Your throat may be sore and scratchy. This will go away slowly over time.  You will go home when you are fully awake and  able to swallow liquids.  You should have someone stay with you for the next 24 hours.  Do not drive or operate machinery for the next 24 hours.   This information is not intended to replace advice given to you by your health care provider. Make sure you discuss any questions you have with your health care provider.   Document Released: 01/11/2009 Document Revised: 03/21/2013 Document Reviewed: 09/15/2012 Elsevier Interactive Patient Education 2016 [employer].  Hospital doctor cardioversion is the delivery of a jolt of electricity to change the rhythm of the heart. Sticky patches or metal paddles are placed on the chest to deliver the electricity from a device. This is done to restore a normal rhythm. A rhythm that is too fast or not regular keeps the heart from pumping well. Electrical cardioversion is done in an emergency if:   There is low or no blood pressure as a result of the heart rhythm.   Normal rhythm must be restored as fast as possible to protect the brain and heart from further damage.   It may save a life. Cardioversion may be done for heart rhythms that are not immediately life threatening, such as atrial fibrillation or flutter, in which:   The heart is beating too fast or is not regular.   Medicine to change the rhythm has not worked.   It is safe to wait in order to allow time for preparation.  Symptoms of the abnormal rhythm are bothersome.  The risk of stroke and other serious problems can be reduced. LET [facility] CARE PROVIDER KNOW ABOUT:   Any allergies you have.  All medicines you are taking, including vitamins, herbs, eye drops, creams, and over-the-counter medicines.  Previous problems you or members of your family have had with the use of anesthetics.   Any blood disorders you have.   Previous surgeries you have had.   Medical conditions you have. RISKS AND COMPLICATIONS  Generally, this is a safe procedure.  However, problems can occur and include:   Breathing problems related to the anesthetic used.  A blood clot that breaks free and travels to other parts of your body. This could cause a stroke or other problems. The risk of this is lowered by use of blood-thinning medicine (anticoagulant) prior to the procedure.  Cardiac arrest (rare). BEFORE THE PROCEDURE   You may have tests to detect blood clots in your heart and to evaluate heart function.  You may start taking anticoagulants so your blood does not clot as easily.   Medicines may be given to help stabilize your heart rate and rhythm. PROCEDURE  You will be given medicine through an IV tube to reduce discomfort and make you sleepy (sedative).   An electrical shock will be delivered. AFTER THE PROCEDURE Your heart rhythm will be watched to make sure it does not change.  This information is not intended to replace advice given to you by your health care provider. Make sure you discuss any questions you have with your health care provider.   Document Released: 03/06/2002 Document Revised: 04/06/2014 Document Reviewed: 09/28/2012 Elsevier Interactive Patient Education 2016 [employer].  Electrical Cardioversion, Care After Refer to this sheet in the next few weeks. These instructions provide you with information on caring for yourself after your procedure. Your health care provider may also give you more specific instructions. Your treatment has been planned according to current medical practices, but problems sometimes occur. Call your health care provider if you have any problems or questions after your procedure. WHAT TO EXPECT AFTER THE PROCEDURE After your procedure, it is typical to have the following sensations:  Some redness on the skin where the shocks were delivered. If this is tender, a sunburn lotion or hydrocortisone cream may help.  Possible return of an abnormal heart rhythm within hours or days after the  procedure. HOME CARE INSTRUCTIONS  Take medicines only as directed by your health care provider. Be sure you understand how and when to take your medicine.  Learn how to feel your pulse and check it often.  Limit your activity for 48 hours after the procedure or as directed by your health care provider.  Avoid or minimize caffeine and other stimulants as directed by your health care provider. SEEK MEDICAL CARE IF:  You feel like your heart is beating too fast or your pulse is not regular.  You have any questions about your medicines.  You have bleeding that will not stop. SEEK IMMEDIATE MEDICAL CARE IF:  You are dizzy or feel faint.  It is hard to breathe or you feel short of breath.  There is a change in discomfort in your chest.  Your speech is slurred or you have trouble moving an arm or leg on one side of your body.  You get a serious muscle cramp that does not go away.  Your fingers or toes turn cold or blue.   This information is not intended to replace advice given to you by your health care provider. Make sure you discuss any questions you have with your health care provider.   Document Released: 01/04/2013 Document Revised: 04/06/2014 Document Reviewed: 01/04/2013 Elsevier Interactive Patient Education [employer].

## 2015-07-23 LAB — PROTIME-INR
INR: 1.1 (ref 0.8–1.2)
Prothrombin Time: 11.7 s (ref 9.1–12.0)

## 2015-07-23 LAB — CBC WITH DIFFERENTIAL/PLATELET
BASOS: 0 %
Basophils Absolute: 0 10*3/uL (ref 0.0–0.2)
EOS (ABSOLUTE): 0.1 10*3/uL (ref 0.0–0.4)
EOS: 2 %
HEMATOCRIT: 44.9 % (ref 34.0–46.6)
HEMOGLOBIN: 15 g/dL (ref 11.1–15.9)
IMMATURE GRANULOCYTES: 0 %
Immature Grans (Abs): 0 10*3/uL (ref 0.0–0.1)
LYMPHS: 26 %
Lymphocytes Absolute: 1.4 10*3/uL (ref 0.7–3.1)
MCH: 31.5 pg (ref 26.6–33.0)
MCHC: 33.4 g/dL (ref 31.5–35.7)
MCV: 94 fL (ref 79–97)
MONOCYTES: 7 %
MONOS ABS: 0.4 10*3/uL (ref 0.1–0.9)
NEUTROS PCT: 65 %
Neutrophils Absolute: 3.5 10*3/uL (ref 1.4–7.0)
Platelets: 177 10*3/uL (ref 150–379)
RBC: 4.76 x10E6/uL (ref 3.77–5.28)
RDW: 13.8 % (ref 12.3–15.4)
WBC: 5.4 10*3/uL (ref 3.4–10.8)

## 2015-07-23 LAB — COMPREHENSIVE METABOLIC PANEL
ALT: 13 IU/L (ref 0–32)
AST: 19 IU/L (ref 0–40)
Albumin/Globulin Ratio: 1.6 (ref 1.2–2.2)
Albumin: 3.9 g/dL (ref 3.5–4.7)
Alkaline Phosphatase: 71 IU/L (ref 39–117)
BUN/Creatinine Ratio: 13 (ref 12–28)
BUN: 8 mg/dL (ref 8–27)
Bilirubin Total: 0.8 mg/dL (ref 0.0–1.2)
CO2: 23 mmol/L (ref 18–29)
Calcium: 9.1 mg/dL (ref 8.7–10.3)
Chloride: 105 mmol/L (ref 96–106)
Creatinine, Ser: 0.64 mg/dL (ref 0.57–1.00)
GFR calc Af Amer: 97 mL/min/{1.73_m2} (ref >59)
GFR calc non Af Amer: 84 mL/min/{1.73_m2} (ref >59)
Globulin, Total: 2.4 g/dL (ref 1.5–4.5)
Glucose: 126 mg/dL — ABNORMAL HIGH (ref 65–99)
Potassium: 4.1 mmol/L (ref 3.5–5.2)
Sodium: 143 mmol/L (ref 134–144)
Total Protein: 6.3 g/dL (ref 6.0–8.5)

## 2015-07-24 ENCOUNTER — Ambulatory Visit: Payer: Medicare Other | Admitting: Cardiology

## 2015-07-30 ENCOUNTER — Telehealth: Payer: Self-pay | Admitting: Cardiology

## 2015-07-30 NOTE — Telephone Encounter (Signed)
Calling to review information for tomorrows procedure and there is no answer or voicemail available. Will try to call again later.

## 2015-07-30 NOTE — Telephone Encounter (Signed)
Patients daughter [name] conference called her mother and we reviewed all instructions for tomorrows procedure along with location and time of testing. They verbalized understanding and had no further questions at this time.

## 2015-07-30 NOTE — Telephone Encounter (Signed)
Left message with patients daughter [name] for her to call back and verify procedure scheduled tomorrow for her mother. Unable to reach anyone at her mothers number.

## 2015-07-31 ENCOUNTER — Other Ambulatory Visit: Payer: Self-pay

## 2015-07-31 ENCOUNTER — Ambulatory Visit: Payer: Medicare Other | Admitting: Anesthesiology

## 2015-07-31 ENCOUNTER — Ambulatory Visit
Admission: RE | Admit: 2015-07-31 | Discharge: 2015-07-31 | Disposition: A | Discharge status: Home / Self Care | Payer: Medicare Other | Source: Ambulatory Visit | Attending: Cardiology | Admitting: Cardiology

## 2015-07-31 ENCOUNTER — Ambulatory Visit (HOSPITAL_BASED_OUTPATIENT_CLINIC_OR_DEPARTMENT_OTHER)
Admission: RE | Admit: 2015-07-31 | Discharge: 2015-07-31 | Disposition: A | Discharge status: Home / Self Care | Payer: Medicare Other | Source: Ambulatory Visit | Attending: Cardiology | Admitting: Cardiology

## 2015-07-31 ENCOUNTER — Encounter
Admission: RE | Disposition: A | Discharge status: Home / Self Care | Payer: Self-pay | Source: Ambulatory Visit | Attending: Cardiology

## 2015-07-31 DIAGNOSIS — I481 Persistent atrial fibrillation: Secondary | ICD-10-CM | POA: Diagnosis not present

## 2015-07-31 DIAGNOSIS — Z9842 Cataract extraction status, left eye: Secondary | ICD-10-CM | POA: Diagnosis not present

## 2015-07-31 DIAGNOSIS — Z79899 Other long term (current) drug therapy: Secondary | ICD-10-CM | POA: Diagnosis not present

## 2015-07-31 DIAGNOSIS — H409 Unspecified glaucoma: Secondary | ICD-10-CM | POA: Diagnosis not present

## 2015-07-31 DIAGNOSIS — Z9889 Other specified postprocedural states: Secondary | ICD-10-CM | POA: Insufficient documentation

## 2015-07-31 DIAGNOSIS — I340 Nonrheumatic mitral (valve) insufficiency: Secondary | ICD-10-CM

## 2015-07-31 DIAGNOSIS — M19042 Primary osteoarthritis, left hand: Secondary | ICD-10-CM | POA: Insufficient documentation

## 2015-07-31 DIAGNOSIS — M19041 Primary osteoarthritis, right hand: Secondary | ICD-10-CM | POA: Diagnosis not present

## 2015-07-31 DIAGNOSIS — E785 Hyperlipidemia, unspecified: Secondary | ICD-10-CM | POA: Insufficient documentation

## 2015-07-31 DIAGNOSIS — I4892 Unspecified atrial flutter: Secondary | ICD-10-CM | POA: Diagnosis not present

## 2015-07-31 DIAGNOSIS — Z7901 Long term (current) use of anticoagulants: Secondary | ICD-10-CM | POA: Diagnosis not present

## 2015-07-31 DIAGNOSIS — K219 Gastro-esophageal reflux disease without esophagitis: Secondary | ICD-10-CM | POA: Diagnosis not present

## 2015-07-31 DIAGNOSIS — Z7984 Long term (current) use of oral hypoglycemic drugs: Secondary | ICD-10-CM | POA: Diagnosis not present

## 2015-07-31 DIAGNOSIS — Z9841 Cataract extraction status, right eye: Secondary | ICD-10-CM | POA: Insufficient documentation

## 2015-07-31 DIAGNOSIS — I4891 Unspecified atrial fibrillation: Secondary | ICD-10-CM | POA: Diagnosis not present

## 2015-07-31 DIAGNOSIS — I10 Essential (primary) hypertension: Secondary | ICD-10-CM | POA: Insufficient documentation

## 2015-07-31 DIAGNOSIS — E119 Type 2 diabetes mellitus without complications: Secondary | ICD-10-CM | POA: Insufficient documentation

## 2015-07-31 HISTORY — PX: ELECTROPHYSIOLOGIC STUDY: SHX172A

## 2015-07-31 HISTORY — PX: TEE WITHOUT CARDIOVERSION: SHX5443

## 2015-07-31 SURGERY — ECHOCARDIOGRAM, TRANSESOPHAGEAL
Anesthesia: General

## 2015-07-31 MED ORDER — BUTAMBEN-TETRACAINE-BENZOCAINE 2-2-14 % EX AERO
INHALATION_SPRAY | CUTANEOUS | Status: AC
  Filled 2015-07-31: qty 20

## 2015-07-31 MED ORDER — SODIUM CHLORIDE 0.9% FLUSH: 3.0000 mL | INTRAVENOUS | Status: DC | PRN

## 2015-07-31 MED ORDER — SODIUM CHLORIDE 0.9% FLUSH
3.0000 mL | Freq: Two times a day (BID) | INTRAVENOUS | Status: DC
Start: 2015-07-31 — End: 2015-07-31

## 2015-07-31 MED ORDER — LIDOCAINE VISCOUS 2 % MT SOLN
OROMUCOSAL | Status: DC
Start: 2015-07-31 — End: 2015-07-31
  Filled 2015-07-31: qty 15

## 2015-07-31 MED ORDER — SODIUM CHLORIDE 0.9 % IV SOLN
INTRAVENOUS | Status: DC
  Administered 2015-07-31: 07:00:00 via INTRAVENOUS

## 2015-07-31 MED ORDER — SODIUM CHLORIDE 0.9 % IV SOLN
250.0000 mL | INTRAVENOUS | Status: DC
  Administered 2015-07-31: 08:00:00 via INTRAVENOUS

## 2015-07-31 MED ORDER — PROPOFOL 10 MG/ML IV BOLUS
INTRAVENOUS | Status: DC | PRN
  Administered 2015-07-31: 30 mg via INTRAVENOUS
  Administered 2015-07-31 (×3): 20 mg via INTRAVENOUS
  Administered 2015-07-31: 10 mg via INTRAVENOUS
  Administered 2015-07-31: 30 mg via INTRAVENOUS
  Administered 2015-07-31: 20 mg via INTRAVENOUS

## 2015-07-31 NOTE — Progress Notes (Signed)
    Patient presented for TEE/cardioversion for persistent atrial fibrillation. Please refer to office visit note from 07/22/2015.   TEE was done,  Refer to official TEE report. No left atrium or left atrial appendage thrombus noted.   We then proceeded with electrical cardioversion. Refer to separate procedure note.  Patient remained  Hemodynamically stable throughout the procedure.  No complications noted.

## 2015-07-31 NOTE — Procedures (Signed)
Cardioversion procedure Note   Pre procedure Diagnosis: Persistent Atrial fibrillation Post procedure Diagnosis: Successful cardioversion to sinus rhythm  Procedures: Electrical cardioversion  Description: Petra Kuba of procedure, benefits, and risks ( including but not limited to less than 1% chance of major complications that include esophageal perforation, bleeding, MI, CVA, death) were discussed in detail with patient and her daughters. Patient verbalized understanding and signed consent. Patient does not have any history of radiation to the neck and chest, no diet dysphagia, no dysphagia.Informed, written consent was obtained for cardioversion. Adequate IV acces and airway support were assured. The patient was adequately sedated with intravenous propofol ( Total of 150 mg IV propofol) as outlined in the anesthesia report. Anesthesia was provided for by nurse anesthetist, under supervision of Dr. Rosey Bath.  The patient presented today in atrial fibrillation. She was successfully cardioverted to sinus rhythm with a single synchronized biphasic 200J shock delivered with cardioversion electrodes placed in the anterior/posterior configuration. She successfully converted to sinus rhythm and Remained in sinus rhythm thereafter. There were no apparent complications. Patient remained hemodynamically stable. Sinus rate of 84 BPM, 114/63, 96% O2 saturation.  Conclusions:  1. Successful cardioversion of afib to sinus rhythm 2. No apparent complications.

## 2015-07-31 NOTE — Transfer of Care (Signed)
Immediate Anesthesia Transfer of Care Note  Patient: Amber Wolfe  Procedure(s) Performed: Procedure(s): TRANSESOPHAGEAL ECHOCARDIOGRAM (TEE) (N/A) CARDIOVERSION (N/A)  Patient Location: PACU and Cath Lab  Anesthesia Type:General  Level of Consciousness: patient cooperative and lethargic  Airway & Oxygen Therapy: Patient Spontanous Breathing and Patient connected to nasal cannula oxygen  Post-op Assessment: Report given to RN and Post -op Vital signs reviewed and stable  Post vital signs: Reviewed and stable  Last Vitals:  Filed Vitals:   07/31/15 0824 07/31/15 0832  BP:  104/62  Pulse: 94 81  Temp:    Resp: 29 16    Last Pain: There were no vitals filed for this visit.       Complications: No apparent anesthesia complications

## 2015-07-31 NOTE — Anesthesia Postprocedure Evaluation (Signed)
Anesthesia Post Note  Patient: Amber Wolfe  Procedure(s) Performed: Procedure(s) (LRB): TOTAL HIP ARTHROPLASTY (Right)  Patient location during evaluation: Cath Lab Anesthesia Type: General Level of consciousness: awake and alert and oriented Pain management: pain level controlled Vital Signs Assessment: post-procedure vital signs reviewed and stable Respiratory status: spontaneous breathing Cardiovascular status: stable Anesthetic complications: no    Last Vitals:  Filed Vitals:   07/31/15 0845 07/31/15 0855  BP: 121/97 132/76  Pulse: 82 82  Temp:    Resp: 23 26    Last Pain: There were no vitals filed for this visit.               Lanora Manis

## 2015-07-31 NOTE — Procedures (Deleted)
Cardioversion procedure Note   Pre procedure Diagnosis:  Persistent Atrial fibrillation Post procedure Diagnosis:   Successful cardioversion to sinus rhythm  Procedures:  Electrical cardioversion  Description:   Petra Kuba of procedure, benefits, and risks ( including but not limited to less than 1% chance of major complications that include esophageal perforation, bleeding, MI, CVA, death)  were discussed in detail with patient and her daughters. Patient verbalized understanding and signed consent. Patient does not have any history of radiation to the neck and chest, no diet dysphagia, no dysphagia.Informed, written consent was obtained for cardioversion.  Adequate IV acces and airway support were assured.  The patient was adequately sedated with intravenous propofol ( Total of 150 mg IV propofol) as outlined in the anesthesia report.   Anesthesia was provided for by nurse anesthetist,  under supervision of Dr. Claudie Leach.  The patient presented today in atrial fibrillation.  She was successfully cardioverted to sinus rhythm with a single synchronized biphasic 200J shock delivered with cardioversion electrodes placed in the anterior/posterior configuration.  She  successfully converted to sinus rhythm and  Remained in sinus rhythm thereafter. There were no apparent complications. Patient remained hemodynamically stable. Sinus rate of 84 BPM, 114/63, 96% O2 saturation.  Conclusions:  1.  Successful cardioversion of afib to sinus rhythm 2.  No apparent complications.

## 2015-07-31 NOTE — Anesthesia Preprocedure Evaluation (Signed)
Anesthesia Evaluation  Patient identified by MRN, date of birth, ID band Patient awake    Reviewed: Allergy & Precautions, H&P , NPO status , Patient's Chart, lab work & pertinent test results, reviewed documented beta blocker date and time   History of Anesthesia Complications Negative for: history of anesthetic complications  Airway Mallampati: III  TM Distance: >3 FB Neck ROM: full    Dental no notable dental hx. (+) Missing   Pulmonary neg pulmonary ROS,    Pulmonary exam normal breath sounds clear to auscultation       Cardiovascular Exercise Tolerance: Good hypertension, (-) angina(-) CAD, (-) Past MI, (-) Cardiac Stents and (-) CABG Normal cardiovascular exam+ dysrhythmias Atrial Fibrillation (-) Valvular Problems/Murmurs Rhythm:regular Rate:Normal     Neuro/Psych negative neurological ROS  negative psych ROS   GI/Hepatic Neg liver ROS, GERD  Medicated,  Endo/Other  diabetes  Renal/GU negative Renal ROS  negative genitourinary   Musculoskeletal   Abdominal   Peds  Hematology negative hematology ROS (+)   Anesthesia Other Findings Past Medical History:   Hyperlipidemia                                               Hypertension                                                 GERD (gastroesophageal reflux disease)                       Diabetes mellitus without complication (HCC)                   Comment:Type II   Arthritis                                                      Comment:In hands   Cataract                                                     Glaucoma                                                       Comment:Right Eye   Reproductive/Obstetrics negative OB ROS                             Anesthesia Physical Anesthesia Plan  ASA: III  Anesthesia Plan: General   Post-op Pain Management:    Induction:   Airway Management Planned:   Additional Equipment:    Intra-op Plan:   Post-operative Plan:   Informed Consent: I have reviewed the patients History and Physical, chart, labs and discussed the procedure including the risks, benefits and alternatives for the proposed  anesthesia with the patient or authorized representative who has indicated his/her understanding and acceptance.   Dental Advisory Given  Plan Discussed with: Anesthesiologist, CRNA and Surgeon  Anesthesia Plan Comments:         Anesthesia Quick Evaluation

## 2015-07-31 NOTE — Progress Notes (Signed)
*  PRELIMINARY RESULTS* Echocardiogram Echocardiogram Transesophageal has been performed.  Amber Wolfe 07/31/2015, 8:38 AM

## 2015-08-01 LAB — GLUCOSE, CAPILLARY: Glucose-Capillary: 158 mg/dL — ABNORMAL HIGH (ref 65–99)

## 2015-08-06 ENCOUNTER — Encounter: Payer: Self-pay | Admitting: Cardiology

## 2015-08-06 ENCOUNTER — Ambulatory Visit (INDEPENDENT_AMBULATORY_CARE_PROVIDER_SITE_OTHER): Payer: Medicare Other | Admitting: Cardiology

## 2015-08-06 VITALS — BP 144/66 | HR 65 | Ht 66.0 in | Wt 172.8 lb

## 2015-08-06 DIAGNOSIS — I10 Essential (primary) hypertension: Secondary | ICD-10-CM | POA: Diagnosis not present

## 2015-08-06 DIAGNOSIS — I4891 Unspecified atrial fibrillation: Secondary | ICD-10-CM | POA: Diagnosis not present

## 2015-08-06 DIAGNOSIS — E785 Hyperlipidemia, unspecified: Secondary | ICD-10-CM

## 2015-08-06 NOTE — Patient Instructions (Signed)
Medication Instructions:  Your physician recommends that you continue on your current medications as directed. Please refer to the Current Medication list given to you today.   Labwork: None ordered  Testing/Procedures: None ordered  Follow-Up: Your physician wants you to follow-up in: 6 months with Dr. Ingal. You will receive a reminder letter in the mail two months in advance. If you don't receive a letter, please call our office to schedule the follow-up appointment.   Any Other Special Instructions Will Be Listed Below (If Applicable).     If you need a refill on your cardiac medications before your next appointment, please call your pharmacy.   

## 2015-08-06 NOTE — Progress Notes (Signed)
Cardiology Office Note   Date:  08/06/2015   ID:  Amber Wolfe, DOB 02-Feb-1932, MRN [id number]  Referring Doctor:  Enid Derry, MD   Cardiologist:   Wende Bushy, MD   Reason for consultation:  Chief Complaint  Patient presents with  . Atrial Fibrillation    NO CP, NO SOB OR SWELLING. NO OTHER COMPLAINTS.   Follow-up for atrial fibrillation s/p TEE 07/31/2015   History of Present Illness: Amber Wolfe is a 82 y.o. female who presents for follow-up s/p TEE for afib  Patient reports significant improvement in her energy level. She has noticed that fatigue is less, and she is able to do more things without having to stop. Also, she didn't really pay attention to this before but now she is able to walk from the parking lot to the office without having to stop to catch her breath. She had been having shortness of breath before that she just dismissed as part getting old.  No chest pain, as mentioned above shortness of breath is improved, no palpitations no loss of consciousness.  ROS:  Please see the history of present illness. Aside from mentioned under HPI, all other systems are reviewed and negative.     Past Medical History  Diagnosis Date  . Hyperlipidemia   . Hypertension   . GERD (gastroesophageal reflux disease)   . Diabetes mellitus without complication ([place])     Type II  . Arthritis     In hands  . Cataract   . Glaucoma     Right Eye    Past Surgical History  Procedure Laterality Date  . Cataract extraction Bilateral   . Wrist surgery Left   . Cesarean section    . Wisdom tooth extraction    . Tee without cardioversion N/A 07/31/2015    Procedure: TRANSESOPHAGEAL ECHOCARDIOGRAM (TEE);  Surgeon: Wende Bushy, MD;  Location: [facility];  Service: Cardiovascular;  Laterality: N/A;  . Electrophysiologic study N/A 07/31/2015    Procedure: CARDIOVERSION;  Surgeon: Wende Bushy, MD;  Location: [facility];  Service: Cardiovascular;  Laterality: N/A;     reports  that she has never smoked. She has never used smokeless tobacco. She reports that she does not drink alcohol or use illicit drugs.   family history includes Aneurysm in her daughter; Cataracts in her mother; Heart attack in her brother and father.   Current Outpatient Prescriptions  Medication Sig Dispense Refill  . atorvastatin (LIPITOR) 20 MG tablet Take 1 tablet (20 mg total) by mouth at bedtime. 30 tablet 6  . glimepiride (AMARYL) 2 MG tablet Take 1 tablet (2 mg total) by mouth daily with breakfast. 30 tablet 5  . latanoprost (XALATAN) 0.005 % ophthalmic solution Place 1 drop into both eyes at bedtime.     Marland Kitchen lisinopril (PRINIVIL,ZESTRIL) 5 MG tablet Take 1.5 tablets (7.5 mg total) by mouth daily. 45 tablet 6  . metoprolol tartrate (LOPRESSOR) 25 MG tablet Take 1 tablet (25 mg total) by mouth 2 (two) times daily. 60 tablet 6  . omeprazole (PRILOSEC) 20 MG capsule Take 20 mg by mouth daily.     . rivaroxaban (XARELTO) 20 MG TABS tablet Take 1 tablet (20 mg total) by mouth daily with supper. STOP aspirin 30 tablet 5  . timolol (TIMOPTIC) 0.5 % ophthalmic solution Place 1 drop into both eyes daily.      No current facility-administered medications for this visit.    Allergies: Review of patient's allergies indicates no known allergies.  PHYSICAL EXAM: VS:  BP 144/66 mmHg  Pulse 65  Ht 5\' 6"  (1.676 m)  Wt 172 lb 12.8 oz (78.382 kg)  BMI 27.90 kg/m2 , Body mass index is 27.9 kg/(m^2). Wt Readings from Last 3 Encounters:  08/06/15 172 lb 12.8 oz (78.382 kg)  07/31/15 171 lb (77.565 kg)  07/22/15 171 lb (77.565 kg)    GENERAL:  well developed, well nourished, obese, not in acute distress HEENT: normocephalic, pink conjunctivae, anicteric sclerae, no xanthelasma, normal dentition, oropharynx clear NECK:  no neck vein engorgement, JVP normal, no hepatojugular reflux, carotid upstroke brisk and symmetric, no bruit, no thyromegaly, no lymphadenopathy LUNGS:  good respiratory effort,  clear to auscultation bilaterally CV:  PMI not displaced, no thrills, no lifts, S1, S2 within normal limits, no palpable S3 or S4, no murmurs, no rubs, no gallops ABD:  Soft, nontender, nondistended, normoactive bowel sounds, no abdominal aortic bruit, no hepatomegaly, no splenomegaly MS: nontender back, no kyphosis, no scoliosis, no joint deformities EXT:  2+ DP/PT pulses, no edema, no varicosities, no cyanosis, no clubbing SKIN: warm, nondiaphoretic, normal turgor, no ulcers NEUROPSYCH: alert, oriented to person, place, and time, sensory/motor grossly intact, normal mood, appropriate affect  Recent Labs: 06/11/2015: TSH 2.380 07/22/2015: ALT 13; BUN 8; Creatinine, Ser 0.64; Platelets 177; Potassium 4.1; Sodium 143   Lipid Panel    Component Value Date/Time   CHOL 159 05/15/2015 1619     Other studies Reviewed:  EKG:  EKG from 08/06/2015 was personally reviewed by me and it showed sinus rhythm, 65 BPM.  EKG from 07/22/2015 was personally reviewed by me and it showed atrial flutter with variable conduction, ventricular rate of 111 bpm. Nonspecific ST-T wave changes.  The ekg from 06/18/2015  was personally reviewed by me and it reveals atrial flutter, with variable AV block, ventricular rate of 79 BPM.  EKG from 06/11/2015 showed atrial fibrillation, 124 ventricular rate. Nonspecific ST-T wave changes.  EKG from 04/20/2014 showed sinus rhythm.  Additional studies/ records that were reviewed personally reviewed by me today include:  Echocardiogram 07/10/2015: Left ventricle: The cavity size was normal. Wall thickness was  normal. The estimated ejection fraction was approximately 40%.  There is hypokinesis in the basal segments, normal wall motion in  the mid and apical segments. - Mitral valve: Calcified annulus. There was mild to moderate  regurgitation. - Left atrium: The atrium was moderately dilated. - Right atrium: The atrium was moderately dilated. - Tricuspid valve:  There was mild-moderate regurgitation.  Holter 07/10/2015: Overall rhythm appears to be atrial flutter. Average heart rate of 78 BPM.  Minimum heart rate of 47 bpm at 4:53 AM 07/11/2015. 3% of total number of beats were in bradycardia. Maximum heart rate of 189 bpm at 10:19 AM of 07/10/2015. 11% of the total number of beats were in tachycardia.  Pharmacologic nuclear stress test 06/21/2015: Pharmacological myocardial perfusion imaging study with no significant ischemia Normal wall motion, EF estimated at 60% No EKG changes concerning for ischemia at peak stress or in recovery. Baseline EKG with atrial fibrillation. Rare PVC noted. Low risk scan  TEE 07/31/2015:  Study Conclusions  - Left atrium: No evidence of thrombus in the atrial cavity or  appendage. Cardioversion with 200 J  ASSESSMENT AND PLAN:  Atrial flutter, as noted on Holter monitor. Average of 78 BPM. Maximum heart rate of 189 BPM, 11% of total number of beats in tachycardia. Atrial fibrillation  Status post cardioversion 07/31/2015, 200 J Sinus rhythm today.  Continue current metoprolol  dose for now.  Continue oral anticoagulation with Xarelto 20mg  po qd.  Hypertension Blood pressure wnl. Patient to continue metoprolol. Blood pressure log recommended.   Hyperlipidemia continue statin therapy  Current medicines are reviewed at length with the patient today.  The patient does not have concerns regarding medicines.  Labs/ tests ordered today include:  No orders of the defined types were placed in this encounter.    I had a lengthy and detailed discussion with the patient regarding diagnoses, prognosis, diagnostic options, treatment options, and side effects of medications.   I counseled the patient on importance of lifestyle modification including heart healthy diet, regular physical activity.   Disposition:   FU with undersigned in 6 months  Signed, Wende Bushy, MD  08/06/2015 2:07 PM    [place]

## 2015-09-09 ENCOUNTER — Ambulatory Visit: Payer: Medicare Other | Admitting: Family Medicine

## 2015-09-19 ENCOUNTER — Encounter: Payer: Self-pay | Admitting: Family Medicine

## 2015-09-19 ENCOUNTER — Ambulatory Visit (INDEPENDENT_AMBULATORY_CARE_PROVIDER_SITE_OTHER): Payer: Medicare Other | Admitting: Family Medicine

## 2015-09-19 VITALS — BP 132/64 | HR 66 | Temp 98.5°F | Resp 16 | Wt 173.0 lb

## 2015-09-19 DIAGNOSIS — M7752 Other enthesopathy of left foot: Secondary | ICD-10-CM | POA: Diagnosis not present

## 2015-09-19 DIAGNOSIS — E1140 Type 2 diabetes mellitus with diabetic neuropathy, unspecified: Secondary | ICD-10-CM | POA: Diagnosis not present

## 2015-09-19 DIAGNOSIS — M1711 Unilateral primary osteoarthritis, right knee: Secondary | ICD-10-CM | POA: Diagnosis not present

## 2015-09-19 DIAGNOSIS — E7800 Pure hypercholesterolemia, unspecified: Secondary | ICD-10-CM

## 2015-09-19 DIAGNOSIS — Z5181 Encounter for therapeutic drug level monitoring: Secondary | ICD-10-CM

## 2015-09-19 MED ORDER — RANITIDINE HCL 300 MG PO TABS: 300.0000 mg | ORAL_TABLET | Freq: Every day | ORAL | Status: DC

## 2015-09-19 NOTE — Patient Instructions (Addendum)
Please do see your eye doctor regularly, and have your eyes examined every year (or more often per his or her recommendation) Check your feet every night and let me know right away of any sores, infections, numbness, etc. Try to limit sweets, white bread, white rice, white potatoes It is okay with me for you to not check your fingerstick blood sugars (per [employer] of Endocrinology Best Practices), unless you are interested and feel it would be helpful for you Try to limit saturated fats in your diet (bologna, hot dogs, barbeque, cheeseburgers, hamburgers, steak, bacon, sausage, cheese, etc.) and get more fresh fruits, vegetables, and whole grains Return in August for JUST fasting labs and return to see me in 6 months Try to lose 5-10 pounds over the next few months

## 2015-09-19 NOTE — Assessment & Plan Note (Signed)
Check A1c in August, last was 7; foot exam by MD today; limit sweets and sugary drinks

## 2015-09-19 NOTE — Assessment & Plan Note (Signed)
Don't go barefoot, limit pressure on that site, don't cross legs, etc

## 2015-09-19 NOTE — Assessment & Plan Note (Signed)
Check lipids in August; limit saturated fats; continue statin

## 2015-09-19 NOTE — Assessment & Plan Note (Signed)
Check sgpt, creatinine 

## 2015-09-19 NOTE — Progress Notes (Signed)
BP 132/64 mmHg  Pulse 66  Temp(Src) 98.5 F (36.9 C) (Oral)  Resp 16  Wt 173 lb (78.472 kg)  SpO2 96%   Subjective:    Patient ID: Amber Wolfe, female    DOB: 09-09-1931, 82 y.o.   MRN: [id number]  HPI: Amber Wolfe is a 82 y.o. female  Chief Complaint  Patient presents with  . Follow-up   Patient is known to me from previous practice; she had recent atrial fib-flutter She had the TEE, then shocked her heart, and it went back into rhythm she says (s/p cardioversion) Dr. Yvone Neu (cardiologist) was very pleased; no chest pain; no SHOB; no swelling in legs Staying on the blood thinner for now  Sugars have been okay; last A1c was 7 in February; some dry mouth, just at night; no blurred vision Last lipid panel showed LDL of 71, HDL 62  HTN; well-controlled today  Arthritis right knee; she went to see the orthopaedist, Dr. Raliegh Ip; all around the knee and in the middle is bone on bone; she really does not want surgery; as long as the shot helps, he is okay giving the shot and not doing surgery  Depression screen [name] 2/9 09/19/2015 05/20/2015 05/15/2015  Decreased Interest 0 1 1  Down, Depressed, Hopeless 0 0 1  PHQ - 2 Score 0 1 2  Altered sleeping - - 0  Tired, decreased energy - - 1  Change in appetite - - 3  Feeling bad or failure about yourself  - - 0  Trouble concentrating - - 0  Moving slowly or fidgety/restless - - 1  Suicidal thoughts - - 0  PHQ-9 Score - - 7  Difficult doing work/chores - Not difficult at all Not difficult at all   Relevant past medical, surgical, family and social history reviewed Past Medical History  Diagnosis Date  . Hyperlipidemia   . Hypertension   . GERD (gastroesophageal reflux disease)   . Diabetes mellitus without complication ([place])     Type II  . Arthritis     In hands  . Cataract   . Glaucoma     Right Eye   Past Surgical History  Procedure Laterality Date  . Cataract extraction Bilateral   . Wrist surgery Left   .  Cesarean section    . Wisdom tooth extraction    . Tee without cardioversion N/A 07/31/2015    Procedure: TRANSESOPHAGEAL ECHOCARDIOGRAM (TEE);  Surgeon: Wende Bushy, MD;  Location: [facility] OR[facility];  Service: Cardiovascular;  Laterality: N/A;  . Electrophysiologic study N/A 07/31/2015    Procedure: CARDIOVERSION;  Surgeon: Wende Bushy, MD;  Location: [facility] OR[facility];  Service: Cardiovascular;  Laterality: N/A;   Family History  Problem Relation Age of Onset  . Cataracts Mother   . Heart attack Father   . Heart attack Brother   . Aneurysm Daughter    Social History  Substance Use Topics  . Smoking status: Never Smoker   . Smokeless tobacco: Never Used  . Alcohol Use: No   Interim medical history since last visit reviewed. Allergies and medications reviewed  Review of Systems Per HPI unless specifically indicated above     Objective:    BP 132/64 mmHg  Pulse 66  Temp(Src) 98.5 F (36.9 C) (Oral)  Resp 16  Wt 173 lb (78.472 kg)  SpO2 96%  Wt Readings from Last 3 Encounters:  09/19/15 173 lb (78.472 kg)  08/06/15 172 lb 12.8 oz (78.382 kg)  07/31/15 171 lb (77.565 kg)  Physical Exam  Constitutional: She appears well-developed and well-nourished. No distress.  HENT:  Head: Normocephalic and atraumatic.  Eyes: EOM are normal. No scleral icterus.  Neck: No thyromegaly present.  Cardiovascular: Normal rate, regular rhythm and normal heart sounds.   No murmur heard. Pulmonary/Chest: Effort normal and breath sounds normal. No respiratory distress. She has no wheezes.  Abdominal: Soft. Bowel sounds are normal. She exhibits no distension.  Musculoskeletal: Normal range of motion. She exhibits no edema.       Left foot: There is swelling and deformity (swelling in the left heel, medially and under calcaneus). There is no tenderness.  Neurological: She is alert. She exhibits normal muscle tone.  Skin: Skin is warm and dry. She is not diaphoretic. No pallor.  Psychiatric: She has a normal  mood and affect. Her behavior is normal. Judgment and thought content normal.   Diabetic Foot Form - Detailed   Diabetic Foot Exam - detailed  Diabetic Foot exam was performed with the following findings:  Yes 09/19/2015  2:55 PM  Visual Foot Exam completed.:  Yes  Are the toenails long?:  No  Are the toenails thick?:  No  Are the toenails ingrown?:  No  Normal Range of Motion:  Yes    Pulse Foot Exam completed.:  Yes  Right Dorsalis Pedis:  Present Left Dorsalis Pedis:  Present  Sensory Foot Exam Completed.:  Yes  Semmes-Weinstein Monofilament Test    Comments:  Diminished right foot more than foot, but present to monofilament testing      Results for orders placed or performed during the hospital encounter of 07/31/15  Glucose, capillary  Result Value Ref Range   Glucose-Capillary 158 (H) 65 - 99 mg/dL      Assessment & Plan:   Problem List Items Addressed This Visit      Endocrine   Diabetes mellitus with neuropathy ([place]) - Primary    Check A1c in August, last was 7; foot exam by MD today; limit sweets and sugary drinks      Relevant Orders   Hemoglobin A1c   Microalbumin / creatinine urine ratio     Musculoskeletal and Integument   Left calcaneal bursitis    Don't go barefoot, limit pressure on that site, don't cross legs, etc      Primary osteoarthritis of right knee    Severe tricompartmental OA of right knee imaging on Feb 2017; saw Dr. Mack Guise; going to have cortisone shots periodically, limiting because of diabetes; weight loss encouraged        Other   High cholesterol    Check lipids in August; limit saturated fats; continue statin      Relevant Orders   Lipid panel   Medication monitoring encounter    Check sgpt, creatinine      Relevant Orders   COMPLETE METABOLIC PANEL WITH GFR   CBC with Differential/Platelet      Follow up plan: Return in about 8 weeks (around 11/13/2015) for fasting labs ONLY; return to see Dr. Sanda Klein in 6 months.  An  after-visit summary was printed and given to the patient at [facility].  Please see the patient instructions which may contain other information and recommendations beyond what is mentioned above in the assessment and plan.  Meds ordered this encounter  Medications  . ranitidine (ZANTAC) 300 MG tablet    Sig: Take 1 tablet (300 mg total) by mouth at bedtime.    Dispense:  30 tablet    Refill:  5  Orders Placed This Encounter  Procedures  . Lipid panel  . Hemoglobin A1c  . Microalbumin / creatinine urine ratio  . COMPLETE METABOLIC PANEL WITH GFR  . CBC with Differential/Platelet

## 2015-09-19 NOTE — Assessment & Plan Note (Signed)
Severe tricompartmental OA of right knee imaging on Feb 2017; saw Dr. Mack Guise; going to have cortisone shots periodically, limiting because of diabetes; weight loss encouraged

## 2015-11-16 ENCOUNTER — Other Ambulatory Visit: Payer: Self-pay | Admitting: Family Medicine

## 2015-11-16 NOTE — Telephone Encounter (Signed)
Note on Rx to have labs done, ordered in June

## 2015-12-04 ENCOUNTER — Other Ambulatory Visit: Payer: Self-pay

## 2015-12-04 DIAGNOSIS — E1149 Type 2 diabetes mellitus with other diabetic neurological complication: Secondary | ICD-10-CM

## 2015-12-04 DIAGNOSIS — Z5181 Encounter for therapeutic drug level monitoring: Secondary | ICD-10-CM | POA: Diagnosis not present

## 2015-12-04 DIAGNOSIS — E7800 Pure hypercholesterolemia, unspecified: Secondary | ICD-10-CM

## 2015-12-04 LAB — COMPLETE METABOLIC PANEL WITH GFR
ALBUMIN: 4 g/dL (ref 3.6–5.1)
ALK PHOS: 73 U/L (ref 33–130)
ALT: 10 U/L (ref 6–29)
AST: 15 U/L (ref 10–35)
BILIRUBIN TOTAL: 0.7 mg/dL (ref 0.2–1.2)
BUN: 9 mg/dL (ref 7–25)
CO2: 26 mmol/L (ref 20–31)
CREATININE: 0.6 mg/dL (ref 0.60–0.88)
Calcium: 9.1 mg/dL (ref 8.6–10.4)
Chloride: 106 mmol/L (ref 98–110)
GFR, EST NON AFRICAN AMERICAN: 85 mL/min (ref >=60)
GFR, Est African American: >89 mL/min (ref >=60)
GLUCOSE: 98 mg/dL (ref 65–99)
Potassium: 4 mmol/L (ref 3.5–5.3)
SODIUM: 139 mmol/L (ref 135–146)
TOTAL PROTEIN: 6.6 g/dL (ref 6.1–8.1)

## 2015-12-04 LAB — CBC WITH DIFFERENTIAL/PLATELET
BASOS ABS: 0 {cells}/uL (ref 0–200)
BASOS PCT: 0 %
EOS ABS: 174 {cells}/uL (ref 15–500)
EOS PCT: 3 %
HCT: 40.7 % (ref 35.0–45.0)
HEMOGLOBIN: 13.4 g/dL (ref 11.7–15.5)
LYMPHS ABS: 1508 {cells}/uL (ref 850–3900)
Lymphocytes Relative: 26 %
MCH: 30.9 pg (ref 27.0–33.0)
MCHC: 32.9 g/dL (ref 32.0–36.0)
MCV: 94 fL (ref 80.0–100.0)
MPV: 11.6 fL (ref 7.5–12.5)
Monocytes Absolute: 580 cells/uL (ref 200–950)
Monocytes Relative: 10 %
NEUTROS ABS: 3538 {cells}/uL (ref 1500–7800)
Neutrophils Relative %: 61 %
Platelets: 186 10*3/uL (ref 140–400)
RBC: 4.33 MIL/uL (ref 3.80–5.10)
RDW: 12.9 % (ref 11.0–15.0)
WBC: 5.8 10*3/uL (ref 3.8–10.8)

## 2015-12-04 LAB — LIPID PANEL
Cholesterol: 140 mg/dL (ref 125–200)
HDL: 54 mg/dL (ref >=46)
LDL CALC: 59 mg/dL (ref <130)
Total CHOL/HDL Ratio: 2.6 Ratio (ref <=5.0)
Triglycerides: 137 mg/dL (ref <150)
VLDL: 27 mg/dL (ref <30)

## 2015-12-05 ENCOUNTER — Other Ambulatory Visit: Payer: Self-pay | Admitting: Family Medicine

## 2015-12-05 LAB — HEMOGLOBIN A1C
HEMOGLOBIN A1C: 6.2 % — AB (ref <5.7)
MEAN PLASMA GLUCOSE: 131 mg/dL

## 2015-12-05 LAB — MICROALBUMIN / CREATININE URINE RATIO
CREATININE, URINE: 54 mg/dL (ref 20–320)
MICROALB UR: 0.5 mg/dL
MICROALB/CREAT RATIO: 9 ug/mg{creat} (ref <30)

## 2015-12-05 MED ORDER — GLIMEPIRIDE 1 MG PO TABS: 1.0000 mg | ORAL_TABLET | Freq: Every day | ORAL | 5 refills | Status: DC

## 2015-12-16 ENCOUNTER — Other Ambulatory Visit: Payer: Self-pay | Admitting: Cardiology

## 2015-12-16 ENCOUNTER — Other Ambulatory Visit: Payer: Self-pay | Admitting: Family Medicine

## 2015-12-16 DIAGNOSIS — E785 Hyperlipidemia, unspecified: Secondary | ICD-10-CM

## 2015-12-16 DIAGNOSIS — I4891 Unspecified atrial fibrillation: Secondary | ICD-10-CM

## 2015-12-16 DIAGNOSIS — I10 Essential (primary) hypertension: Secondary | ICD-10-CM

## 2015-12-16 NOTE — Telephone Encounter (Signed)
Sept 2017 creatinine, K+ reviewed Last cardiology note reviewed; continue xarelto Refills approved

## 2016-01-02 DIAGNOSIS — H401131 Primary open-angle glaucoma, bilateral, mild stage: Secondary | ICD-10-CM | POA: Diagnosis not present

## 2016-01-15 ENCOUNTER — Other Ambulatory Visit: Payer: Self-pay | Admitting: Family Medicine

## 2016-01-15 NOTE — Telephone Encounter (Signed)
Last Cr and K+ reviewed; rx approved 

## 2016-02-04 ENCOUNTER — Ambulatory Visit (INDEPENDENT_AMBULATORY_CARE_PROVIDER_SITE_OTHER): Payer: Medicare Other | Admitting: Cardiology

## 2016-02-04 ENCOUNTER — Encounter: Payer: Self-pay | Admitting: Cardiology

## 2016-02-04 VITALS — BP 160/70 | HR 56 | Ht 66.0 in | Wt 171.0 lb

## 2016-02-04 DIAGNOSIS — I484 Atypical atrial flutter: Secondary | ICD-10-CM | POA: Diagnosis not present

## 2016-02-04 DIAGNOSIS — I4891 Unspecified atrial fibrillation: Secondary | ICD-10-CM

## 2016-02-04 DIAGNOSIS — I10 Essential (primary) hypertension: Secondary | ICD-10-CM | POA: Diagnosis not present

## 2016-02-04 DIAGNOSIS — E785 Hyperlipidemia, unspecified: Secondary | ICD-10-CM

## 2016-02-04 MED ORDER — LISINOPRIL 10 MG PO TABS: 10.0000 mg | ORAL_TABLET | Freq: Every day | ORAL | 6 refills | Status: DC

## 2016-02-04 NOTE — Patient Instructions (Addendum)
Medication Instructions:  Your physician has recommended you make the following change in your medication:  1. START Lisinopril 10 mg Once daily (You may take 2 tablets of your current prescription until it runs out.)   Labwork: Your physician recommends that you return for lab work in: 1 week for BMP.    Follow-Up: Your physician wants you to follow-up in: 6 months with Dr. Yvone Wolfe. You will receive a reminder letter in the mail two months in advance. If you don't receive a letter, please call our office to schedule the follow-up appointment.  It was a pleasure seeing you today here in the office. Please do not hesitate to give [place] a call back if you have any further questions. [place], BSN      Basic Metabolic Panel WHY AM I HAVING THIS TEST? A basic metabolic panel measures levels of the following substances in your blood:   Glucose. Glucose is a simple sugar that serves as the main source of energy for your body.  Creatinine. Creatinine is a waste product of normal muscle activity. It is excreted from the body by the kidneys.  Blood urea nitrogen (BUN). Urea nitrogen is a waste product of protein breakdown. It is produced when excess protein in your body is broken down and used for energy. It is excreted by the kidneys.  Electrolytes. Electrolytes are negatively or positively charged particles that are dissolved in the water of different body compartments. This includes the serum portion of blood, water inside cells, and water outside cells. Concentrations of electrolytes vary among the different fluid compartments. Electrolytes are tightly regulated to maintain a salt-water and acid-base balance in the body. The electrolytes measured in a basic metabolic panel include:  Potassium.  Sodium.  Chloride.  Calcium.  Bicarbonate. WHAT KIND OF SAMPLE IS TAKEN? A blood sample is required for this test. It is usually collected by inserting a needle into a  vein. HOW DO I PREPARE FOR THE TEST? Your health care provider may ask you to avoid eating or drinking anything before your blood sample is taken. Do not eat or drink anything after midnight on the night before the procedure or as directed by your health care provider. WHAT ARE THE REFERENCE RANGES? Reference ranges are considered healthy ranges established after testing a large group of healthy people. Reference ranges may vary among different people, labs, and hospitals. It is your responsibility to obtain your test results. Ask the lab or department performing the test when and how you will get your results. The following are reference ranges for each part of a basic metabolic panel: Glucose  Cord: 45-96 mg/dL or 2.5-5.3 mmol/L (SI units).  Premature infant: 20-60 mg/dL or 1.1-3.3 mmol/L.  Neonate: 30-60 mg/dL or 1.7-3.3 mmol/L.  Infant: 40-90 mg/dL or 2.2-5 mmol/L.  Child under 32 years old: 60-100 mg/dL or 3.3-5.5 mmol/L.  Adult or child over 73 years old:  Fasting: 70-110 mg/dL or less than 6.1 mmol/L.  Random (nonfasting or casual): less than or equal to 200 mg/dL or less than 11.1 mmol/L.  Elderly: increase in normal range after age 18 years. Creatinine  Child under 54 years old: 0.1-0.4 mg/dL.  Child 65-93 years old: 0.2-0.5 mg/dL.  Child 21-98 years old: 0.3-0.6 mg/dL.  Child or adolescent 31-98 years old: 0.4-1 mg/dL.  Adult 22-27 years old:  Female: 0.5-1 mg/dL.  Female: 0.6-1.2 mg/dL.  Adult 30-18 years old:  Female: 0.5-1.1 mg/dL.  Female: 0.6-1.3 mg/dL.  Adult 81 years old  and above:  Female: 0.5-1.2 mg/dL.  Female: 0.7-1.3 mg/dL. BUN  Cord: 21-40 mg/dL.  Newborn: 3-12 mg/dL.  Infant: 5-18 mg/dL.  Child: 5-18 mg/dL.  Adult: 10-20 mg/dL or 3.6-7.1 mmol/L (SI units).  Elderly: may be slightly higher than adult. Potassium  Newborn: 3.9-5.9 mEq/L.  Infant: 4.1-5.3 mEq/L.  Child: 3.4-4.7 mEq/L.  Adult or elderly: 3.5-5 mEq/L or 3.5-5 mmol/L (SI  units). Sodium  Newborn: 134-144 mEq/L.  Infant: 134-150 mEq/L.  Child: 136-145 mEq/L.  Adult or elderly: 136-145 mEq/L or 136-145 mmol/L (SI units). Chloride  Premature infant: 95-110 mEq/L.  Newborn: 96-106 mEq/L.  Child: 90-110 mEq/L.  Adult or elderly: 98-106 mEq/L or 98-106 mmol/L (SI units). Calcium  Total calcium:  Newborn under 95 days old: 7.6-10.4 mg/dL or 1.9-2.6 mmol/L.  Umbilical: [id number] mg/dL or 2.25-2.88 mmol/L.  80 days to 80 years old: 9-10.6 mg/dL or 2.30-2.65 mmol/L.  Child: 8.8-10.8 mg/dL or 2.2-2.7 mmol/L.  Adult: 9-10.5 mg/dL or 2.25-2.62 mmol/L.  Ionized calcium:  Newborn: 4.20-5.58 mg/dL or 1.05-1.37 mmol/L.  2 months to 80 years old: 4.80-5.52 mg/dL or 1.20-1.38 mmol/L.  Adult: 4.5-5.6 mg/dL or 1.05-1.30 mmol/L. Bicarbonate  Newborn: 13-22 mEq/L.  Infant: 20-28 mEq/L.  Child: 20-28 mEq/L.  Adult or elderly: 23-30 mEq/L or 23-30 mmol/L (SI units). WHAT DO THE RESULTS MEAN? Diet and levels of activity can have an effect on your test results. Sometimes they can be the cause of values that are outside of normal limits. However, sometimes values outside normal limits can indicate a medical disorder.  Glucose:  Abnormally high glucose levels (hyperglycemia) are usually associated with prediabetes mellitus and diabetes mellitus. They can also occur with severe stress on the body. This can come from surgery or events such as stroke or trauma. Overactive thyroid gland and pancreatitis or pancreatic cancer can also cause abnormally high glucose levels.  Abnormally low glucose levels (hypoglycemia) can occur with underactive thyroid gland and rare insulin-secreting tumors (insulinoma).  Creatinine:  Abnormally high creatinine levels are most commonly seen in kidney failure. They can also be seen with overactive thyroid (hyperthyroidism), conditions related to overgrowth of the body, abnormal breakdown of muscle tissue, and early muscular  dystrophy.  Abnormally low creatinine levels can indicate low muscle mass associated with malnutrition or late-stage muscular dystrophy.  BUN:  Abnormally high BUN levels generally mean that your kidneys are not functioning normally.  Abnormally low BUN levels can be seen with malnutrition and liver failure.  Potassium:  Abnormally high potassium levels are most often seen with kidney disease, massive destruction of red blood cells, and adrenal gland failure.  Abnormally low potassium levels are seen with excessive levels of the hormone aldosterone.  Sodium:  Abnormally high sodium levels can be seen with dehydration, excessive thirst, and urination due to abnormally low levels of antidiuretic hormone (diabetes insipidus). They can also be seen with excessive levels of aldosterone or cortisol in the body.  Abnormally low levels of sodium can be seen with congestive heart failure, cirrhosis of the liver, kidney failure, and the syndrome of inappropriate antidiuretic hormone (SIADH). Chloride:  Abnormally high levels of chloride can be seen with acute kidney failure, diabetes insipidus, prolonged diarrhea, and poisoning with aspirin or bromide.  Abnormally low levels of chloride can be seen with prolonged vomiting, acute adrenal gland failure, and SIADH.  Calcium:  Abnormally high levels of calcium can occur with excessive activity of the parathyroid glands, certain cancers, and a type of inflammation seen in sarcoidosis and tuberculosis.  Abnormally low levels  of calcium can be seen with underactive parathyroid glands, vitamin D deficiency, and acute pancreatitis.  Bicarbonate:  Abnormally high bicarbonate levels are seen after prolonged vomiting and diuretic therapy, which lead to a decrease in the amount of acid in the body (metabolic alkalosis). They can also be seen in conditions that increase the amount of bicarbonate in the body. These conditions include rare hereditary  disorders that interfere with how your kidneys handle electrolytes.  Abnormally low bicarbonate levels are seen with conditions that cause your body to produce too much acid (metabolic acidosis). These conditions include uncontrolled diabetes mellitus and poisoning with aspirin, methanol, or antifreeze (ethylene glycol). Talk with your health care provider to discuss your results, treatment options, and if necessary, the need for more tests. Talk with your health care provider if you have any questions about your results.   This information is not intended to replace advice given to you by your health care provider. Make sure you discuss any questions you have with your health care provider.   Document Released: 04/08/2004 Document Revised: 04/06/2014 Document Reviewed: 08/14/2013 Elsevier Interactive Patient Education [employer].

## 2016-02-04 NOTE — Progress Notes (Signed)
Cardiology Office Note   Date:  02/04/2016   ID:  Amber Wolfe, DOB [birth year], MRN [id number]  Referring Doctor:  Enid Derry, MD   Cardiologist:   Wende Bushy, MD   Reason for consultation:  Chief Complaint  Patient presents with  . Follow-up    no cp, sob or swelling. no other complaints.      History of Present Illness: Amber Wolfe is a 82 y.o. female who presents for follow-up afib  Since last visit, patient is doing well. She had one brief as the episode of fluttering in her chest but that resolved right away. She does not have chest pain or shortness of breath. She has had trouble with her knees and has had falls in the past. She is going to check with her doctor about an assistive device like a cane or walker.   In terms of blood pressure, her blood pressure is noted to be elevated in the 150s most of the time.  No chest pain, no palpitations no loss of consciousness.  ROS:  Please see the history of present illness. Aside from mentioned under HPI, all other systems are reviewed and negative.     Past Medical History:  Diagnosis Date  . Arthritis    In hands  . Cataract   . Diabetes mellitus without complication ([place])    Type II  . GERD (gastroesophageal reflux disease)   . Glaucoma    Right Eye  . Hyperlipidemia   . Hypertension     Past Surgical History:  Procedure Laterality Date  . CATARACT EXTRACTION Bilateral   . CESAREAN SECTION    . ELECTROPHYSIOLOGIC STUDY N/A 07/31/2015   Procedure: CARDIOVERSION;  Surgeon: Wende Bushy, MD;  Location: [facility] ORS;  Service: Cardiovascular;  Laterality: N/A;  . TEE WITHOUT CARDIOVERSION N/A 07/31/2015   Procedure: TRANSESOPHAGEAL ECHOCARDIOGRAM (TEE);  Surgeon: Wende Bushy, MD;  Location: [facility] ORS;  Service: Cardiovascular;  Laterality: N/A;  . WISDOM TOOTH EXTRACTION    . Wrist Surgery Left      reports that she has never smoked. She has never used smokeless tobacco. She reports that she does not  drink alcohol or use drugs.   family history includes Aneurysm in her daughter; Cataracts in her mother; Heart attack in her brother and father.   Current Outpatient Prescriptions  Medication Sig Dispense Refill  . atorvastatin (LIPITOR) 20 MG tablet Take 1 tablet (20 mg total) by mouth at bedtime. 30 tablet 6  . FLUARIX QUADRIVALENT 0.5 ML injection Inject as directed once.    Marland Kitchen glimepiride (AMARYL) 1 MG tablet Take 1 tablet (1 mg total) by mouth daily with breakfast. (lower dose) 30 tablet 5  . latanoprost (XALATAN) 0.005 % ophthalmic solution Place 1 drop into both eyes at bedtime.     . metoprolol tartrate (LOPRESSOR) 25 MG tablet TAKE ONE (1) TABLET BY MOUTH TWO (2) TIMES DAILY 60 tablet 3  . ranitidine (ZANTAC) 300 MG tablet Take 1 tablet (300 mg total) by mouth at bedtime. 30 tablet 5  . rivaroxaban (XARELTO) 20 MG TABS tablet Take 1 tablet (20 mg total) by mouth daily with supper. 30 tablet 5  . timolol (TIMOPTIC) 0.5 % ophthalmic solution Place 1 drop into both eyes daily.     Marland Kitchen lisinopril (PRINIVIL,ZESTRIL) 10 MG tablet Take 1 tablet (10 mg total) by mouth daily. 30 tablet 6   No current facility-administered medications for this visit.     Allergies: Patient has no known  allergies.    PHYSICAL EXAM: VS:  BP (!) 160/70 (BP Location: Left Arm, Patient Position: Sitting, Cuff Size: Normal)   Pulse (!) 56   Ht 5\' 6"  (1.676 m)   Wt 171 lb (77.6 kg)   BMI 27.60 kg/m  , Body mass index is 27.6 kg/m. Wt Readings from Last 3 Encounters:  02/04/16 171 lb (77.6 kg)  09/19/15 173 lb (78.5 kg)  08/06/15 172 lb 12.8 oz (78.4 kg)    GENERAL:  well developed, well nourished, obese, not in acute distress HEENT: normocephalic, pink conjunctivae, anicteric sclerae, no xanthelasma, normal dentition, oropharynx clear NECK:  no neck vein engorgement, JVP normal, no hepatojugular reflux, carotid upstroke brisk and symmetric, no bruit, no thyromegaly, no lymphadenopathy LUNGS:  good  respiratory effort, clear to auscultation bilaterally CV:  PMI not displaced, no thrills, no lifts, S1, S2 within normal limits, no palpable S3 or S4, no murmurs, no rubs, no gallops ABD:  Soft, nontender, nondistended, normoactive bowel sounds, no abdominal aortic bruit, no hepatomegaly, no splenomegaly MS: nontender back, no kyphosis, no scoliosis, no joint deformities EXT:  2+ DP/PT pulses, no edema, no varicosities, no cyanosis, no clubbing SKIN: warm, nondiaphoretic, normal turgor, no ulcers NEUROPSYCH: alert, oriented to person, place, and time, sensory/motor grossly intact, normal mood, appropriate affect  Recent Labs: 06/11/2015: TSH 2.380 12/04/2015: ALT 10; BUN 9; Creat 0.60; Hemoglobin 13.4; Platelets 186; Potassium 4.0; Sodium 139   Lipid Panel    Component Value Date/Time   CHOL 140 12/04/2015 1410   CHOL 159 05/15/2015 1619   TRIG 137 12/04/2015 1410   TRIG 124 05/15/2015 1619   HDL 54 12/04/2015 1410   CHOLHDL 2.6 12/04/2015 1410   VLDL 27 12/04/2015 1410   VLDL 25 05/15/2015 1619   LDLCALC 59 12/04/2015 1410     Other studies Reviewed:  EKG:  EKG from 08/06/2015 was personally reviewed by me and it showed sinus rhythm, 65 BPM.  EKG from 07/22/2015 was personally reviewed by me and it showed atrial flutter with variable conduction, ventricular rate of 111 bpm. Nonspecific ST-T wave changes.  The ekg from 06/18/2015  was personally reviewed by me and it reveals atrial flutter, with variable AV block, ventricular rate of 79 BPM.  EKG from 06/11/2015 showed atrial fibrillation, 124 ventricular rate. Nonspecific ST-T wave changes.  EKG from 04/20/2014 showed sinus rhythm.  Additional studies/ records that were reviewed personally reviewed by me today include:  Echocardiogram 07/10/2015: Left ventricle: The cavity size was normal. Wall thickness was  normal. The estimated ejection fraction was approximately 40%.  There is hypokinesis in the basal segments, normal  wall motion in  the mid and apical segments. - Mitral valve: Calcified annulus. There was mild to moderate  regurgitation. - Left atrium: The atrium was moderately dilated. - Right atrium: The atrium was moderately dilated. - Tricuspid valve: There was mild-moderate regurgitation.  Holter 07/10/2015: Overall rhythm appears to be atrial flutter. Average heart rate of 78 BPM.  Minimum heart rate of 47 bpm at 4:53 AM 07/11/2015. 3% of total number of beats were in bradycardia. Maximum heart rate of 189 bpm at 10:19 AM of 07/10/2015. 11% of the total number of beats were in tachycardia.  Pharmacologic nuclear stress test 06/21/2015: Pharmacological myocardial perfusion imaging study with no significant ischemia Normal wall motion, EF estimated at 60% No EKG changes concerning for ischemia at peak stress or in recovery. Baseline EKG with atrial fibrillation. Rare PVC noted. Low risk scan  TEE 07/31/2015:  Study Conclusions  - Left atrium: No evidence of thrombus in the atrial cavity or  appendage. Cardioversion with 200 J  ASSESSMENT AND PLAN:  Atrial flutter, as noted on Holter monitor. Average of 78 BPM. Maximum heart rate of 189 BPM, 11% of total number of beats in tachycardia. Atrial fibrillation  Status post cardioversion 07/31/2015, 200 J Sinus rhythm today.  Continue current metoprolol dose for now.  Continue oral anticoagulation with Xarelto 20mg  po qd. Patient advised to talk to PCP about getting a cane or a walker to avoid falls. She will be following up with her orthopedic doctor as well.  Hypertension Blood pressure elevated. Will up titrate lisinopril total of 10 mg daily. Check BMP in 1 week. Continue blood pressure log. Patient to continue metoprolol.   Hyperlipidemia continue statin therapy. PCP following labs.  Current medicines are reviewed at length with the patient today.  The patient does not have concerns regarding medicines.  Labs/ tests ordered  today include:  Orders Placed This Encounter  Procedures  . Basic metabolic panel  . EKG 12-Lead    I had a lengthy and detailed discussion with the patient regarding diagnoses, prognosis, diagnostic options, treatment options, and side effects of medications.   I counseled the patient on importance of lifestyle modification including heart healthy diet, regular physical activity.   Disposition:   FU with undersigned in 6 months  Signed, Wende Bushy, MD  02/04/2016 12:50 PM    [place]

## 2016-02-11 ENCOUNTER — Other Ambulatory Visit (INDEPENDENT_AMBULATORY_CARE_PROVIDER_SITE_OTHER): Payer: Medicare Other | Admitting: *Deleted

## 2016-02-11 DIAGNOSIS — I10 Essential (primary) hypertension: Secondary | ICD-10-CM

## 2016-02-12 LAB — BASIC METABOLIC PANEL
BUN / CREAT RATIO: 18 (ref 12–28)
BUN: 11 mg/dL (ref 8–27)
CHLORIDE: 101 mmol/L (ref 96–106)
CO2: 23 mmol/L (ref 18–29)
Calcium: 9.2 mg/dL (ref 8.7–10.3)
Creatinine, Ser: 0.61 mg/dL (ref 0.57–1.00)
GFR calc non Af Amer: 85 mL/min/{1.73_m2} (ref >59)
GFR, EST AFRICAN AMERICAN: 98 mL/min/{1.73_m2} (ref >59)
GLUCOSE: 173 mg/dL — AB (ref 65–99)
Potassium: 4.2 mmol/L (ref 3.5–5.2)
SODIUM: 141 mmol/L (ref 134–144)

## 2016-02-17 ENCOUNTER — Observation Stay (HOSPITAL_BASED_OUTPATIENT_CLINIC_OR_DEPARTMENT_OTHER)
Admit: 2016-02-17 | Discharge: 2016-02-17 | Disposition: A | Discharge status: Home / Self Care | Payer: Medicare Other | Attending: Physician Assistant | Admitting: Physician Assistant

## 2016-02-17 ENCOUNTER — Telehealth: Payer: Self-pay | Admitting: Cardiology

## 2016-02-17 ENCOUNTER — Observation Stay
Admission: EM | Admit: 2016-02-17 | Discharge: 2016-02-19 | Disposition: A | Discharge status: Home / Self Care | Payer: Medicare Other | Attending: Internal Medicine | Admitting: Internal Medicine

## 2016-02-17 ENCOUNTER — Emergency Department: Payer: Medicare Other

## 2016-02-17 ENCOUNTER — Ambulatory Visit: Payer: Medicare Other | Admitting: Cardiology

## 2016-02-17 DIAGNOSIS — E1140 Type 2 diabetes mellitus with diabetic neuropathy, unspecified: Secondary | ICD-10-CM | POA: Diagnosis not present

## 2016-02-17 DIAGNOSIS — K219 Gastro-esophageal reflux disease without esophagitis: Secondary | ICD-10-CM | POA: Diagnosis not present

## 2016-02-17 DIAGNOSIS — I4892 Unspecified atrial flutter: Secondary | ICD-10-CM | POA: Diagnosis not present

## 2016-02-17 DIAGNOSIS — I4891 Unspecified atrial fibrillation: Secondary | ICD-10-CM | POA: Diagnosis not present

## 2016-02-17 DIAGNOSIS — Z7984 Long term (current) use of oral hypoglycemic drugs: Secondary | ICD-10-CM | POA: Diagnosis not present

## 2016-02-17 DIAGNOSIS — I484 Atypical atrial flutter: Secondary | ICD-10-CM | POA: Diagnosis present

## 2016-02-17 DIAGNOSIS — I5022 Chronic systolic (congestive) heart failure: Secondary | ICD-10-CM | POA: Diagnosis not present

## 2016-02-17 DIAGNOSIS — J90 Pleural effusion, not elsewhere classified: Secondary | ICD-10-CM | POA: Diagnosis not present

## 2016-02-17 DIAGNOSIS — R079 Chest pain, unspecified: Secondary | ICD-10-CM | POA: Diagnosis present

## 2016-02-17 DIAGNOSIS — H409 Unspecified glaucoma: Secondary | ICD-10-CM | POA: Diagnosis not present

## 2016-02-17 DIAGNOSIS — Z7901 Long term (current) use of anticoagulants: Secondary | ICD-10-CM | POA: Insufficient documentation

## 2016-02-17 DIAGNOSIS — R002 Palpitations: Secondary | ICD-10-CM | POA: Diagnosis present

## 2016-02-17 DIAGNOSIS — I200 Unstable angina: Secondary | ICD-10-CM | POA: Diagnosis not present

## 2016-02-17 DIAGNOSIS — I480 Paroxysmal atrial fibrillation: Secondary | ICD-10-CM | POA: Diagnosis not present

## 2016-02-17 DIAGNOSIS — E119 Type 2 diabetes mellitus without complications: Secondary | ICD-10-CM | POA: Diagnosis not present

## 2016-02-17 DIAGNOSIS — I209 Angina pectoris, unspecified: Secondary | ICD-10-CM | POA: Diagnosis present

## 2016-02-17 DIAGNOSIS — Z79899 Other long term (current) drug therapy: Secondary | ICD-10-CM | POA: Diagnosis not present

## 2016-02-17 DIAGNOSIS — I10 Essential (primary) hypertension: Secondary | ICD-10-CM | POA: Diagnosis not present

## 2016-02-17 DIAGNOSIS — I110 Hypertensive heart disease with heart failure: Secondary | ICD-10-CM | POA: Insufficient documentation

## 2016-02-17 DIAGNOSIS — I340 Nonrheumatic mitral (valve) insufficiency: Secondary | ICD-10-CM | POA: Diagnosis not present

## 2016-02-17 DIAGNOSIS — E785 Hyperlipidemia, unspecified: Secondary | ICD-10-CM | POA: Diagnosis present

## 2016-02-17 HISTORY — DX: Paroxysmal atrial fibrillation: I48.0

## 2016-02-17 HISTORY — DX: Unspecified atrial flutter: I48.92

## 2016-02-17 HISTORY — DX: Nonrheumatic mitral (valve) insufficiency: I34.0

## 2016-02-17 LAB — LIPID PANEL
Cholesterol: 138 mg/dL (ref 0–200)
HDL: 39 mg/dL — AB (ref >40)
LDL Cholesterol: 72 mg/dL (ref 0–99)
Total CHOL/HDL Ratio: 3.5 RATIO
Triglycerides: 135 mg/dL (ref <150)
VLDL: 27 mg/dL (ref 0–40)

## 2016-02-17 LAB — HEPATIC FUNCTION PANEL
ALT: 12 U/L — ABNORMAL LOW (ref 14–54)
AST: 21 U/L (ref 15–41)
Albumin: 3.7 g/dL (ref 3.5–5.0)
Alkaline Phosphatase: 71 U/L (ref 38–126)
BILIRUBIN DIRECT: 0.1 mg/dL (ref 0.1–0.5)
BILIRUBIN INDIRECT: 0.7 mg/dL (ref 0.3–0.9)
Total Bilirubin: 0.8 mg/dL (ref 0.3–1.2)
Total Protein: 6.8 g/dL (ref 6.5–8.1)

## 2016-02-17 LAB — BASIC METABOLIC PANEL
ANION GAP: 6 (ref 5–15)
BUN: 15 mg/dL (ref 6–20)
CHLORIDE: 106 mmol/L (ref 101–111)
CO2: 26 mmol/L (ref 22–32)
CREATININE: 0.76 mg/dL (ref 0.44–1.00)
Calcium: 9 mg/dL (ref 8.9–10.3)
GFR calc non Af Amer: >60 mL/min (ref >60)
GLUCOSE: 171 mg/dL — AB (ref 65–99)
Potassium: 3.9 mmol/L (ref 3.5–5.1)
Sodium: 138 mmol/L (ref 135–145)

## 2016-02-17 LAB — PROTIME-INR
INR: 1.89
Prothrombin Time: 22 seconds — ABNORMAL HIGH (ref 11.4–15.2)

## 2016-02-17 LAB — CBC
HCT: 41.7 % (ref 35.0–47.0)
HEMOGLOBIN: 14.2 g/dL (ref 12.0–16.0)
MCH: 31.9 pg (ref 26.0–34.0)
MCHC: 34.2 g/dL (ref 32.0–36.0)
MCV: 93.3 fL (ref 80.0–100.0)
Platelets: 169 10*3/uL (ref 150–440)
RBC: 4.47 MIL/uL (ref 3.80–5.20)
RDW: 13.8 % (ref 11.5–14.5)
WBC: 5 10*3/uL (ref 3.6–11.0)

## 2016-02-17 LAB — TROPONIN I: Troponin I: <0.03 ng/mL (ref <0.03)

## 2016-02-17 LAB — TSH: TSH: 2.747 u[IU]/mL (ref 0.350–4.500)

## 2016-02-17 LAB — GLUCOSE, CAPILLARY
GLUCOSE-CAPILLARY: 175 mg/dL — AB (ref 65–99)
GLUCOSE-CAPILLARY: 152 mg/dL — AB (ref 65–99)

## 2016-02-17 MED ORDER — ENOXAPARIN SODIUM 40 MG/0.4ML ~~LOC~~ SOLN: 40.0000 mg | SUBCUTANEOUS | Status: DC

## 2016-02-17 MED ORDER — ASPIRIN EC 81 MG PO TBEC
81.0000 mg | DELAYED_RELEASE_TABLET | Freq: Every day | ORAL | Status: DC
  Administered 2016-02-17 – 2016-02-19 (×3): 81 mg via ORAL
  Filled 2016-02-17 (×3): qty 1

## 2016-02-17 MED ORDER — INSULIN ASPART 100 UNIT/ML ~~LOC~~ SOLN: 0.0000 [IU] | Freq: Every day | SUBCUTANEOUS | Status: DC

## 2016-02-17 MED ORDER — INSULIN ASPART 100 UNIT/ML ~~LOC~~ SOLN
0.0000 [IU] | Freq: Three times a day (TID) | SUBCUTANEOUS | Status: DC
  Administered 2016-02-17 – 2016-02-19 (×4): 2 [IU] via SUBCUTANEOUS
  Filled 2016-02-17 (×4): qty 2

## 2016-02-17 MED ORDER — SODIUM CHLORIDE 0.9% FLUSH
3.0000 mL | Freq: Two times a day (BID) | INTRAVENOUS | Status: DC
  Administered 2016-02-19: 3 mL via INTRAVENOUS

## 2016-02-17 MED ORDER — ONDANSETRON HCL 4 MG/2ML IJ SOLN: 4.0000 mg | Freq: Four times a day (QID) | INTRAMUSCULAR | Status: DC | PRN

## 2016-02-17 MED ORDER — LISINOPRIL 10 MG PO TABS
10.0000 mg | ORAL_TABLET | Freq: Every day | ORAL | Status: DC
  Administered 2016-02-18: 10 mg via ORAL
  Filled 2016-02-17: qty 1

## 2016-02-17 MED ORDER — SODIUM CHLORIDE 0.9 % IV SOLN: 250.0000 mL | INTRAVENOUS | Status: DC | PRN

## 2016-02-17 MED ORDER — SODIUM CHLORIDE 0.9% FLUSH: 3.0000 mL | INTRAVENOUS | Status: DC | PRN

## 2016-02-17 MED ORDER — ACETAMINOPHEN 650 MG RE SUPP: 650.0000 mg | Freq: Four times a day (QID) | RECTAL | Status: DC | PRN

## 2016-02-17 MED ORDER — RIVAROXABAN 20 MG PO TABS
20.0000 mg | ORAL_TABLET | Freq: Every day | ORAL | Status: DC
  Administered 2016-02-17 – 2016-02-18 (×2): 20 mg via ORAL
  Filled 2016-02-17 (×2): qty 1

## 2016-02-17 MED ORDER — ATORVASTATIN CALCIUM 20 MG PO TABS
20.0000 mg | ORAL_TABLET | Freq: Every day | ORAL | Status: DC
  Administered 2016-02-17 – 2016-02-18 (×2): 20 mg via ORAL
  Filled 2016-02-17 (×2): qty 1

## 2016-02-17 MED ORDER — ONDANSETRON HCL 4 MG PO TABS: 4.0000 mg | ORAL_TABLET | Freq: Four times a day (QID) | ORAL | Status: DC | PRN

## 2016-02-17 MED ORDER — FAMOTIDINE 20 MG PO TABS
20.0000 mg | ORAL_TABLET | Freq: Every day | ORAL | Status: DC
  Administered 2016-02-18 – 2016-02-19 (×2): 20 mg via ORAL
  Filled 2016-02-17 (×2): qty 1

## 2016-02-17 MED ORDER — TIMOLOL MALEATE 0.5 % OP SOLN
1.0000 [drp] | Freq: Every day | OPHTHALMIC | Status: DC
  Administered 2016-02-17: 1 [drp] via OPHTHALMIC
  Filled 2016-02-17: qty 5

## 2016-02-17 MED ORDER — SODIUM CHLORIDE 0.9% FLUSH
3.0000 mL | Freq: Two times a day (BID) | INTRAVENOUS | Status: DC
  Administered 2016-02-17 – 2016-02-18 (×3): 3 mL via INTRAVENOUS

## 2016-02-17 MED ORDER — ACETAMINOPHEN 325 MG PO TABS
650.0000 mg | ORAL_TABLET | Freq: Four times a day (QID) | ORAL | Status: DC | PRN
  Administered 2016-02-18: 650 mg via ORAL
  Filled 2016-02-17: qty 2

## 2016-02-17 MED ORDER — OXYCODONE HCL 5 MG PO TABS: 5.0000 mg | ORAL_TABLET | ORAL | Status: DC | PRN

## 2016-02-17 MED ORDER — AMIODARONE HCL 200 MG PO TABS
400.0000 mg | ORAL_TABLET | Freq: Two times a day (BID) | ORAL | Status: DC
  Administered 2016-02-17 – 2016-02-18 (×2): 400 mg via ORAL
  Filled 2016-02-17 (×2): qty 2

## 2016-02-17 MED ORDER — METOPROLOL TARTRATE 25 MG PO TABS
25.0000 mg | ORAL_TABLET | Freq: Two times a day (BID) | ORAL | Status: DC
  Administered 2016-02-17: 25 mg via ORAL
  Filled 2016-02-17: qty 1

## 2016-02-17 MED ORDER — LATANOPROST 0.005 % OP SOLN
1.0000 [drp] | Freq: Every day | OPHTHALMIC | Status: DC
  Administered 2016-02-17 – 2016-02-18 (×2): 1 [drp] via OPHTHALMIC
  Filled 2016-02-17: qty 2.5

## 2016-02-17 MED ORDER — METOPROLOL TARTRATE 25 MG PO TABS
25.0000 mg | ORAL_TABLET | Freq: Four times a day (QID) | ORAL | Status: DC
  Administered 2016-02-17 – 2016-02-18 (×2): 25 mg via ORAL
  Filled 2016-02-17 (×2): qty 1

## 2016-02-17 NOTE — ED Notes (Signed)
Denies chest pain at this time.

## 2016-02-17 NOTE — Telephone Encounter (Signed)
Pt daughter, Lynelle Smoke, called to report pt had an episode of chest pain Friday evening which resolved on its own.  She did not call EMS. Reports "a little bit" of chest pain over the weekend. Daughter arrived today to find pt diaphoretic w/ 5/10 chest pain. She was unstable as she felt dizzy but did not fall.  Advised daughter to have pt seen in ER now.  She is asymptomatic at this time and reports 0/10 chest pain, pt and daughter request appt w/Dr. Yvone Neu instead. She is agreeable to 1:30pm today. Advised daughter to have pt seen in an ER setting if pt experiences sx again before her appt.  She verbalized understanding w/no further questions.    Daughter called back to cancel appt as she is taking pt to ER now.

## 2016-02-17 NOTE — H&P (Signed)
[facility] NAME: Amber Wolfe    MR#:  [id number]  DATE OF BIRTH:  1931-05-09   DATE OF ADMISSION:  02/17/2016  PRIMARY CARE PHYSICIAN: Enid Derry, MD  Cardiology:[place]  REQUESTING/REFERRING PHYSICIAN: [place]  CHIEF COMPLAINT:   Chief Complaint  Patient presents with  . Chest Pain    HISTORY OF PRESENT ILLNESS:  Amber Wolfe  is a 82 y.o. female with a known history of Atrial fibrillation status post DC cardioversion in May of this year who is presenting with chest pain. She describes acute onset of chest pain which occurred after palpitations-this occurred at rest. States she was watching TV felt a fluttering sensation in her chest followed by chest pain which she described as retrosternal tightness/pressure 6/10 in intensity no worsening or relieving factors associated nausea/and sweating. This episode resolved only to continue the next day. She is currently chest pain-free  PAST MEDICAL HISTORY:   Past Medical History:  Diagnosis Date  . Arthritis    In hands  . Atrial flutter ([place])    a. s/p successful TEE/DCCV 07/31/2015; b. on Xarelto; c. CHADS2VASc => 6 (CHF, HTN, age x 2, DM, female)  . Cataract   . Chronic systolic CHF (congestive heart failure) ([place])    a. echo 06/2015: EF 40%, basal HK, nl WM of mid and apical segments, mild to mod MR/TR; b. TEE 99991111: nl LV systolic function, mild to mod MR, trivial TR  . Diabetes mellitus without complication ([place])    Type II  . GERD (gastroesophageal reflux disease)   . Glaucoma    Right Eye  . Hyperlipidemia   . Hypertension   . Moderate mitral regurgitation    a. see echo from 06/2015 and TEE 07/2015  . PAF (paroxysmal atrial fibrillation) ([place])     PAST SURGICAL HISTORY:   Past Surgical History:  Procedure Laterality Date  . CATARACT EXTRACTION Bilateral   . CESAREAN SECTION    . ELECTROPHYSIOLOGIC STUDY N/A 07/31/2015   Procedure: CARDIOVERSION;  Surgeon:  Wende Bushy, MD;  Location: [facility] ORS;  Service: Cardiovascular;  Laterality: N/A;  . TEE WITHOUT CARDIOVERSION N/A 07/31/2015   Procedure: TRANSESOPHAGEAL ECHOCARDIOGRAM (TEE);  Surgeon: Wende Bushy, MD;  Location: [facility] ORS;  Service: Cardiovascular;  Laterality: N/A;  . WISDOM TOOTH EXTRACTION    . Wrist Surgery Left     SOCIAL HISTORY:   Social History  Substance Use Topics  . Smoking status: Never Smoker  . Smokeless tobacco: Never Used  . Alcohol use No    FAMILY HISTORY:   Family History  Problem Relation Age of Onset  . Cataracts Mother   . Heart attack Father   . Heart attack Brother   . Aneurysm Daughter     DRUG ALLERGIES:  No Known Allergies  REVIEW OF SYSTEMS:  REVIEW OF SYSTEMS:  CONSTITUTIONAL: Denies fevers, chills, fatigue, weakness.  EYES: Denies blurred vision, double vision, or eye pain.  EARS, NOSE, THROAT: Denies tinnitus, ear pain, hearing loss.  RESPIRATORY: denies cough, shortness of breath, wheezing  CARDIOVASCULAR: Positive chest pain, palpitations, denies edema.  GASTROINTESTINAL: Positive nausea, denies vomiting, diarrhea, abdominal pain.  GENITOURINARY: Denies dysuria, hematuria.  ENDOCRINE: Denies nocturia or thyroid problems. HEMATOLOGIC AND LYMPHATIC: Denies easy bruising or bleeding.  SKIN: Denies rash or lesions.  MUSCULOSKELETAL: Denies pain in neck, back, shoulder, knees, hips, or further arthritic symptoms.  NEUROLOGIC: Denies paralysis, paresthesias.  PSYCHIATRIC: Denies anxiety or depressive symptoms. Otherwise full review of  systems performed by me is negative.   MEDICATIONS AT HOME:   Prior to Admission medications   Medication Sig Start Date End Date Taking? Authorizing Provider  atorvastatin (LIPITOR) 20 MG tablet Take 1 tablet (20 mg total) by mouth at bedtime. 05/23/15  Yes Arnetha Courser, MD  glimepiride (AMARYL) 1 MG tablet Take 1 tablet (1 mg total) by mouth daily with breakfast. (lower dose) 12/05/15  Yes Arnetha Courser,  MD  latanoprost (XALATAN) 0.005 % ophthalmic solution Place 1 drop into both eyes at bedtime.    Yes Historical Provider, MD  lisinopril (PRINIVIL,ZESTRIL) 10 MG tablet Take 1 tablet (10 mg total) by mouth daily. 02/04/16 05/04/16 Yes Wende Bushy, MD  metoprolol tartrate (LOPRESSOR) 25 MG tablet TAKE ONE (1) TABLET BY MOUTH TWO (2) TIMES DAILY 12/16/15  Yes Wende Bushy, MD  ranitidine (ZANTAC) 300 MG tablet Take 1 tablet (300 mg total) by mouth at bedtime. 09/19/15  Yes Arnetha Courser, MD  rivaroxaban (XARELTO) 20 MG TABS tablet Take 1 tablet (20 mg total) by mouth daily with supper. 12/16/15  Yes Arnetha Courser, MD  timolol (TIMOPTIC) 0.5 % ophthalmic solution Place 1 drop into both eyes daily.  02/11/15  Yes Historical Provider, MD  FLUARIX QUADRIVALENT 0.5 ML injection Inject as directed once. 01/03/16   Historical Provider, MD      VITAL SIGNS:  Blood pressure (!) 145/87, pulse 82, temperature 97.5 F (36.4 C), temperature source Oral, resp. rate (!) 24, height 5\' 6"  (1.676 m), weight 56.7 kg (125 lb), SpO2 97 %.  PHYSICAL EXAMINATION:  VITAL SIGNS: Vitals:   02/17/16 1230 02/17/16 1300  BP: (!) 148/92 (!) 145/87  Pulse: 78 82  Resp: 19 (!) 24  Temp:     GENERAL:82 y.o.female currently in no acute distress.  HEAD: Normocephalic, atraumatic.  EYES: Pupils equal, round, reactive to light. Extraocular muscles intact. No scleral icterus.  MOUTH: Moist mucosal membrane. Dentition intact. No abscess noted.  EAR, NOSE, THROAT: Clear without exudates. No external lesions.  NECK: Supple. No thyromegaly. No nodules. No JVD.  PULMONARY: Clear to ascultation, without wheeze rails or rhonci. No use of accessory muscles, Good respiratory effort. good air entry bilaterally CHEST: Nontender to palpation.  CARDIOVASCULAR: S1 and S2. Irregular rate and rhythm. No murmurs, rubs, or gallops. No edema. Pedal pulses 2+ bilaterally.  GASTROINTESTINAL: Soft, nontender, nondistended. No masses. Positive bowel  sounds. No hepatosplenomegaly.  MUSCULOSKELETAL: No swelling, clubbing, or edema. Range of motion full in all extremities.  NEUROLOGIC: Cranial nerves II through XII are intact. No gross focal neurological deficits. Sensation intact. Reflexes intact.  SKIN: No ulceration, lesions, rashes, or cyanosis. Skin warm and dry. Turgor intact.  PSYCHIATRIC: Mood, affect within normal limits. The patient is awake, alert and oriented x 3. Insight, judgment intact.    LABORATORY PANEL:   CBC  Recent Labs Lab 02/17/16 1058  WBC 5.0  HGB 14.2  HCT 41.7  PLT 169   ------------------------------------------------------------------------------------------------------------------  Chemistries   Recent Labs Lab 02/17/16 1058  NA 138  K 3.9  CL 106  CO2 26  GLUCOSE 171*  BUN 15  CREATININE 0.76  CALCIUM 9.0   ------------------------------------------------------------------------------------------------------------------  Cardiac Enzymes  Recent Labs Lab 02/17/16 1058  TROPONINI <0.03   ------------------------------------------------------------------------------------------------------------------  RADIOLOGY:  Dg Chest 2 View  Result Date: 02/17/2016 CLINICAL DATA:  Chest pressure. EXAM: CHEST  2 VIEW COMPARISON:  No recent prior . FINDINGS: Mediastinum and hilar structures are normal. Cardiomegaly with normal pulmonary vascularity. Right  chest pleural-parenchymal thickening is noted. This is most likely related to scarring. Tiny right pleural effusion cannot be excluded Tiny amount of fluid in the right major fissure cannot be excluded. No pneumothorax. IMPRESSION: 1. Right-sided pleural thickening, most likely related scarring. Tiny right pleural effusion and tiny amount of fluid in the right major fissure cannot be excluded. 2. Cardiomegaly.  No pulmonary venous congestion . Electronically Signed   By: Marcello Moores  Register   On: 02/17/2016 11:19    EKG:   Orders placed or  performed during the hospital encounter of 02/17/16  . EKG 12-Lead  . EKG 12-Lead  . ED EKG within 10 minutes  . ED EKG within 10 minutes    IMPRESSION AND PLAN:   82 year old Caucasian female history of atrial fibrillation status post DC cardioversion who is presenting with palpitations and chest pain  1. Chest pain, central: Initiate aspirin and statin therapy, admitted to telemetry, trend cardiac enzymes 3,  if continued elevation will initiate heparin drip (patient is already on therapeutic anticoagulation-Xarelto)  ,nitroglycerin when necessary, morphine when necessary, consult cardiology follows with [facility]  2. Atrial fibrillation rate controlled: Continue anticoagulation I suspect that she had an episode of increased rate followed by demand ischemia, currently rate controlled  3. Type 2 diabetes non-insulin-requiring: Hold oral agents Place on sliding scale coverage  4. Essential hypertension: Lisinopril     All the records are reviewed and case discussed with ED provider. Management plans discussed with the patient, family and they are in agreement.  CODE STATUS: Full  TOTAL TIME TAKING CARE OF THIS PATIENT: 33 minutes.    Jylan Loeza,  Karenann Cai.D on 02/17/2016 at 2:28 PM  Between 7am to 6pm - Pager - [phone]  After 6pm: House Pager: - [facility]  Office  [phone]  CC: Primary care physician; Enid Derry, MD

## 2016-02-17 NOTE — ED Triage Notes (Signed)
Pt c/o intermittent chest pressure that radiates into the neck with diaphoresis and weakness since Friday.. Denies pain at present.. Pt states she had a procedure in may to correct a-fib.Marland Kitchen Pt is in a-fib on arrival

## 2016-02-17 NOTE — Care Management Obs Status (Signed)
[place] NOTIFICATION   Patient Details  Name: Amber Wolfe MRN: [id number] Date of Birth: [birth year]   Medicare Observation Status Notification Given:  Yes    Beau Fanny, RN 02/17/2016, 2:28 PM

## 2016-02-17 NOTE — Consult Note (Signed)
Cardiology Consultation Note  Patient ID: Amber Wolfe, MRN: [id number], DOB/AGE: 1931/06/13 82 y.o. Admit date: 02/17/2016   Date of Consult: 02/17/2016 Primary Physician: Enid Derry, MD Primary Cardiologist: Dr. Yvone Neu, MD Requesting Physician: Dr. Lavetta Nielsen, MD  Chief Complaint: Chest pain Reason for Consult: Chest pain and rate controlled atrial flutter  HPI: 82 y.o. female with h/o Afib/flutter on Xarelto s/p recent successful TEE/DCCV 123456, chronic systolic CHF with EF of [id number] by TTE 06/2015, mild to moderate mitral and tricuspid regurgitation, DM2, HTN, HLD, GERD, and glaucoma who presented to [facility] ED today with intermittent chest pain since 02/14/2016.   She was initially referred to [facility] for evaluation of newly diagnosed Afib in March 2017 after PCP noted irregular heart beat on exam. At that time she was noted to be in Afib of unknown duration. She was started on rate control and full-dose anticoagulation. She underwent nuclear stress testing on 06/21/2015 that showed no significant ischemia, normal wall motion, EF 60%, no EKG changes concerning for ischemia at peak stress or recovery, baseline EKG with Afib, overall low risk scan. TTE on 07/10/2015 showed an EF of 40% with hypokinesis of the basal segments and normal wall motion in the mid and apical segments. Mild to moderate mitral regurgitation, moderate biatrial enlargement and mild to moderate tricuspid regurgitation. 48-hour Holter on 07/10/2015 showed an overall rhythm of atrial flutter with an average heart rate of 78 bpm, minimum heart rate of 47 bpm at 4:53 AM on 07/11/2015 with 3% of total beats in bradycardia, and a maximum heart rate of 189 bpm at 10:19 AM on 07/10/2015 with 11% of the total beats in tachycardia. She underwent successful TEE/DCCV on 07/31/2015 at 200 J restoring sinus rhtyhm. She was most recently seen in the office on 02/04/2016 and remained in sinus rhtyhm at that time. She was continued on rate control  and Xarelto.   She called the office on 02/17/2016 with complaints of palpitations, SOB, and intermittent 5/10 chest pain that lasted "seconds" and began on 11/17 with self resolution. Palpitations returned on 11/18, lasting 1-2 minutes with associated chest pressure. On 11/19 she "just did not feel well." Noted palpitations lasting several minutes and self resolving. This morning she noted palpitations lasting 10 minutes in duration, checked her pulse and found it to be irregular. There was some associated chest pressure, SOB and diaphoresis. Pain does not radiate. No worsening factors or alleviating factors. All symptoms occur at rest. No associated vomiting, dizziness, presyncope, or syncope. She initially refused evaluation in the ED and wanted to see Dr. Yvone Neu given she was symptom-free upon calling this morning. She was scheduled to see Dr. Yvone Neu in the PM. She ultimately decided to come to the ED given recurrence of symptoms. Upon her arrival to the ED she has been found to have negative troponin x 1 to date, CBC unremarkable, K+ 3.9, SCr 0.76, CXR with right-sided pleural thickening felt to be related to scarring with a tiny right pleural effusion not fully excluded. EKG showed atypical atrial flutter with variable AV block, 85 bpm, left axis deviation, poor R wave progression, nonspecific st/t changes. BP 123456 tp [id number] systolic upon arrival. Heart rate remained controlled in the 70's to 80's bpm, in atrial flutter with variable AV block. She cannot feel any palpitations while in the ED. She has not missed any doses of her Xarelto.      Past Medical History:  Diagnosis Date  . Arthritis    In hands  .  Atrial flutter ([place])    a. s/p successful TEE/DCCV 07/31/2015; b. on Xarelto; c. CHADS2VASc => 6 (CHF, HTN, age x 2, DM, female)  . Cataract   . Chronic systolic CHF (congestive heart failure) ([place])    a. echo 06/2015: EF 40%, basal HK, nl WM of mid and apical segments, mild to mod MR/TR; b. TEE  99991111: nl LV systolic function, mild to mod MR, trivial TR  . Diabetes mellitus without complication ([place])    Type II  . GERD (gastroesophageal reflux disease)   . Glaucoma    Right Eye  . Hyperlipidemia   . Hypertension   . Moderate mitral regurgitation    a. see echo from 06/2015 and TEE 07/2015  . PAF (paroxysmal atrial fibrillation) ([place])       Most Recent Cardiac Studies: Nuclear stress test 06/24/2015: Study Result   Pharmacological myocardial perfusion imaging study with no significant  ischemia Normal wall motion, EF estimated at 60% No EKG changes concerning for ischemia at peak stress or in recovery. Baseline EKG with atrial fibrillation. Rare PVC noted. Low risk scan   TTE 07/10/2015: Study Conclusions  - Left ventricle: The cavity size was normal. Wall thickness was   normal. The estimated ejection fraction was approximately 40%.   There is hypokinesis in the basal segments, normal wall motion in   the mid and apical segments. - Mitral valve: Calcified annulus. There was mild to moderate   regurgitation. - Left atrium: The atrium was moderately dilated. - Right atrium: The atrium was moderately dilated. - Tricuspid valve: There was mild-moderate regurgitation.  24-hour Holter monitor 07/10/2015: Study Highlights   Overall rhythm appears to be atrial flutter. Average heart rate of 78 BPM.  Minimum heart rate of 47 bpm at 4:53 AM 07/11/2015. 3% of total number of beats were in bradycardia. Maximum heart rate of 189 bpm at 10:19 AM of 07/10/2015. 11% of the total number of beats were in tachycardia.   TEE 07/31/2015: Study Conclusions  - Left atrium: No evidence of thrombus in the atrial cavity or   appendage. - Normal LV systolic function. - Mild to moderate mitral regurgitation. - RV systolic function normal. - Trivial tricuspid regurgitation.    Surgical History:  Past Surgical History:  Procedure Laterality Date  . CATARACT EXTRACTION Bilateral   .  CESAREAN SECTION    . ELECTROPHYSIOLOGIC STUDY N/A 07/31/2015   Procedure: CARDIOVERSION;  Surgeon: Wende Bushy, MD;  Location: [facility] ORS;  Service: Cardiovascular;  Laterality: N/A;  . TEE WITHOUT CARDIOVERSION N/A 07/31/2015   Procedure: TRANSESOPHAGEAL ECHOCARDIOGRAM (TEE);  Surgeon: Wende Bushy, MD;  Location: [facility] ORS;  Service: Cardiovascular;  Laterality: N/A;  . WISDOM TOOTH EXTRACTION    . Wrist Surgery Left      Home Meds: Prior to Admission medications   Medication Sig Start Date End Date Taking? Authorizing Provider  atorvastatin (LIPITOR) 20 MG tablet Take 1 tablet (20 mg total) by mouth at bedtime. 05/23/15  Yes Arnetha Courser, MD  glimepiride (AMARYL) 1 MG tablet Take 1 tablet (1 mg total) by mouth daily with breakfast. (lower dose) 12/05/15  Yes Arnetha Courser, MD  latanoprost (XALATAN) 0.005 % ophthalmic solution Place 1 drop into both eyes at bedtime.    Yes Historical Provider, MD  lisinopril (PRINIVIL,ZESTRIL) 10 MG tablet Take 1 tablet (10 mg total) by mouth daily. 02/04/16 05/04/16 Yes Wende Bushy, MD  metoprolol tartrate (LOPRESSOR) 25 MG tablet TAKE ONE (1) TABLET BY MOUTH TWO (2) TIMES DAILY  12/16/15  Yes Wende Bushy, MD  ranitidine (ZANTAC) 300 MG tablet Take 1 tablet (300 mg total) by mouth at bedtime. 09/19/15  Yes Arnetha Courser, MD  rivaroxaban (XARELTO) 20 MG TABS tablet Take 1 tablet (20 mg total) by mouth daily with supper. 12/16/15  Yes Arnetha Courser, MD  timolol (TIMOPTIC) 0.5 % ophthalmic solution Place 1 drop into both eyes daily.  02/11/15  Yes Historical Provider, MD  FLUARIX QUADRIVALENT 0.5 ML injection Inject as directed once. 01/03/16   Historical Provider, MD    Inpatient Medications:  . sodium chloride flush  3 mL Intravenous Q12H  . sodium chloride flush  3 mL Intravenous Q12H   . sodium chloride      Allergies: No Known Allergies  Social History   Social History  . Marital status: Widowed    Spouse name: N/A  . Number of children: N/A  .  Years of education: N/A   Occupational History  . Not on file.   Social History Main Topics  . Smoking status: Never Smoker  . Smokeless tobacco: Never Used  . Alcohol use No  . Drug use: No  . Sexual activity: Not on file   Other Topics Concern  . Not on file   Social History Narrative  . No narrative on file     Family History  Problem Relation Age of Onset  . Cataracts Mother   . Heart attack Father   . Heart attack Brother   . Aneurysm Daughter      Review of Systems: Review of Systems  Constitutional: Positive for malaise/fatigue. Negative for chills, diaphoresis, fever and weight loss.  HENT: Negative for congestion.   Eyes: Negative for discharge and redness.  Respiratory: Positive for shortness of breath. Negative for cough, hemoptysis, sputum production and wheezing.   Cardiovascular: Positive for chest pain and palpitations. Negative for orthopnea, claudication, leg swelling and PND.  Gastrointestinal: Positive for nausea. Negative for abdominal pain, blood in stool, heartburn, melena and vomiting.  Genitourinary: Negative for hematuria.  Musculoskeletal: Negative for falls and myalgias.  Skin: Negative for rash.  Neurological: Negative for dizziness, tingling, tremors, sensory change, speech change, focal weakness, loss of consciousness and weakness.  Endo/Heme/Allergies: Does not bruise/bleed easily.  Psychiatric/Behavioral: Negative for substance abuse. The patient is not nervous/anxious.   All other systems reviewed and are negative.   Labs:  Recent Labs  02/17/16 1058  TROPONINI <0.03   Lab Results  Component Value Date   WBC 5.0 02/17/2016   HGB 14.2 02/17/2016   HCT 41.7 02/17/2016   MCV 93.3 02/17/2016   PLT 169 02/17/2016     Recent Labs Lab 02/17/16 1058  NA 138  K 3.9  CL 106  CO2 26  BUN 15  CREATININE 0.76  CALCIUM 9.0  GLUCOSE 171*   Lab Results  Component Value Date   CHOL 140 12/04/2015   HDL 54 12/04/2015   LDLCALC  59 12/04/2015   TRIG 137 12/04/2015   No results found for: DDIMER  Radiology/Studies:  Dg Chest 2 View  Result Date: 02/17/2016 CLINICAL DATA:  Chest pressure. EXAM: CHEST  2 VIEW COMPARISON:  No recent prior . FINDINGS: Mediastinum and hilar structures are normal. Cardiomegaly with normal pulmonary vascularity. Right chest pleural-parenchymal thickening is noted. This is most likely related to scarring. Tiny right pleural effusion cannot be excluded Tiny amount of fluid in the right major fissure cannot be excluded. No pneumothorax. IMPRESSION: 1. Right-sided pleural thickening, most likely related  scarring. Tiny right pleural effusion and tiny amount of fluid in the right major fissure cannot be excluded. 2. Cardiomegaly.  No pulmonary venous congestion . Electronically Signed   By: [place]   On: 02/17/2016 11:19    EKG: Interpreted by me showed: atypical atrial flutter with variable AV block, 85 bpm, left axis deviation, poor R wave progression, nonspecific st/t changes Telemetry: Interpreted by me showed: Atrial flutter with variable AV block, 70's to 80's bpm  Weights: Filed Weights   02/17/16 1055 02/17/16 1111  Weight: 171 lb (77.6 kg) 125 lb (56.7 kg)     Physical Exam: Blood pressure (!) 145/87, pulse 82, temperature 97.5 F (36.4 C), temperature source Oral, resp. rate (!) 24, height 5\' 6"  (1.676 m), weight 125 lb (56.7 kg), SpO2 97 %. Body mass index is 20.18 kg/m. General: Well developed, well nourished, in no acute distress. Head: Normocephalic, atraumatic, sclera non-icteric, no xanthomas, nares are without discharge.  Neck: Negative for carotid bruits. JVD not elevated. Lungs: Clear bilaterally to auscultation without wheezes, rales, or rhonchi. Breathing is unlabored. Heart: Irregular with S1 S2. II/VI systolic murmur at the apex and radiates to the axilla, no rubs, or gallops appreciated. Abdomen: Soft, non-tender, non-distended with normoactive bowel sounds.  No hepatomegaly. No rebound/guarding. No obvious abdominal masses. Msk:  Strength and tone appear normal for age. Extremities: No clubbing or cyanosis. No edema. Distal pedal pulses are 2+ and equal bilaterally. Neuro: Alert and oriented X 3. No facial asymmetry. No focal deficit. Moves all extremities spontaneously. Psych:  Responds to questions appropriately with a normal affect.    Assessment and Plan:  Principal Problem:   Atypical atrial flutter (HCC) Active Problems:   Palpitations   Accelerated hypertension   Diabetes mellitus with neuropathy (HCC)   Chest pain   Mitral regurgitation   Hyperlipidemia    1. Atypical atrial flutter/palpitations: -Currently in atypical atrial flutter with variable AV block, rate controlled in the 70's to 80's bpm -No palpitations in this rhythm and rate at this time in the ED -Her symptoms were initially noted with palpitations followed by chest pressure that would like seconds to several minutes and not associated with exertion  -Suspect her main issue is atrial flutter at this time -She has not missed any doses of Xarelto -Increase Lopressor to 25 mg q 6 hours -Add amiodarone 400 mg bid x 1 week, 200 mg bid x 1 week, 200 mg daily thereafter in an effort to pharmacologically cardiovert. Nonetheless, if she does not chemically cardiovert with the above regimen the amiodarone would be helpful moving forward to possibly maintain sinus rhythm should she require repeat DCCV -Check TSH and LFT's given initiation of amiodarone (will need regular eye exam and PFTs as an outpatient) -CHADS2VASc at least 6 (CHF, HTN, age x 2, DM, female)  2. Chest pressure: -Seems to be associated with palpitations, possible tachy-palpitations -Recent low risk nuclear stress test 05/2015 -Continue to cycle troponin levels to rule out -Check echo to evaluate LV systolic function and wall motion  -On Xarelto in place of ASA -Beta blocker as above -If echo shows reduced  EF consider inpatient cardiac cath to further evaluate cardiomyopathy  3. Possible systolic dysfunction: -Echo from 06/2015 with EF of 40%, TEE 07/2015 with normal LV systolic function -She does not appear volume overloaded at this time -Check echo this admission to evaluate LV systolic function -If reduced consider inpatient ischemia workup, if normal no further ischemia workup planned at this time as  an inpatient given recent low risk Myoview  4. Accelerated hypertension: -Increase Lopressor as above -Continue lisinopril -Titrate medications as needed  5. HLD: -Lipitor   6. DM: -Per IM   Signed, Christell Faith, PA-C [facility] Pager: [phone] 02/17/2016, 2:49 PM

## 2016-02-17 NOTE — Telephone Encounter (Signed)
Pt daughter states Friday night she had CP, and started throwing up. States this morning she is sweating, and almost fell. States she has CP as of now.  Pt c/o of Chest Pain: STAT if CP now or developed within 24 hours  1. Are you having CP right now? Yes  2. Are you experiencing any other symptoms (ex. SOB, nausea, vomiting, sweating)? Sweating  3. How long have you been experiencing CP? Since Friday  4. Is your CP continuous or coming and going? Off and on since Friday night  5. Have you taken Nitroglycerin? no ?

## 2016-02-17 NOTE — ED Provider Notes (Signed)
[facility] Emergency Department Provider Note  ____________________________________________   First MD Initiated Contact with Patient 02/17/16 1138     (approximate)  I have reviewed the triage vital signs and the nursing notes.   HISTORY  Chief Complaint Chest Pain   HPI Amber Wolfe is a 82 y.o. female With a history of atrial fibrillation status post cardioversion this past May and was presenting with intermittent chest pain. She says that the pain started Friday night when she was at rest. She describes as a pressure and a 5 out of 10 when present. She says it radiates to the right side of her neck. She says it lasts for about 10-15 minutes and thspontaneously resolves. There are no worsening or alleviating factors. She denies any pain at this time. Also associated with nausea. Denies any vomiting. Says that she is also an episode of dizziness. He does not a history of coronary artery disease. However, takes Xarelto for atrial fibrillation.also says that she has been getting diaphoretic with the pain.   Past Medical History:  Diagnosis Date  . Arthritis    In hands  . Atrial flutter ([place])    a. s/p successful TEE/DCCV 07/31/2015; b. on Xarelto; c. CHADS2VASc => 6 (CHF, HTN, age x 2, DM, female)  . Cataract   . Chronic systolic CHF (congestive heart failure) ([place])    a. echo 06/2015: EF 40%, basal HK, nl WM of mid and apical segments, mild to mod MR/TR; b. TEE 99991111: nl LV systolic function, mild to mod MR, trivial TR  . Diabetes mellitus without complication ([place])    Type II  . GERD (gastroesophageal reflux disease)   . Glaucoma    Right Eye  . Hyperlipidemia   . Hypertension   . Moderate mitral regurgitation    a. see echo from 06/2015 and TEE 07/2015  . PAF (paroxysmal atrial fibrillation) [facility])     Patient Active Problem List   Diagnosis Date Noted  . Chest pain 02/17/2016  . Left calcaneal bursitis 09/19/2015  . Hyperlipidemia  06/18/2015  . Essential hypertension 06/18/2015  . Postmenopausal 05/20/2015  . Primary osteoarthritis of right knee 05/20/2015  . Breast cancer screening 05/20/2015  . Diabetes mellitus with neuropathy ([place]) 05/15/2015  . High cholesterol 05/15/2015  . Essential hypertension, benign 05/15/2015  . Medication monitoring encounter 05/15/2015  . Osteoarthritis of both hands 05/15/2015  . Pedal edema 05/15/2015  . Decreased hearing of both ears 05/15/2015    Past Surgical History:  Procedure Laterality Date  . CATARACT EXTRACTION Bilateral   . CESAREAN SECTION    . ELECTROPHYSIOLOGIC STUDY N/A 07/31/2015   Procedure: CARDIOVERSION;  Surgeon: Wende Bushy, MD;  Location: [facility] ORS;  Service: Cardiovascular;  Laterality: N/A;  . TEE WITHOUT CARDIOVERSION N/A 07/31/2015   Procedure: TRANSESOPHAGEAL ECHOCARDIOGRAM (TEE);  Surgeon: Wende Bushy, MD;  Location: [facility] ORS;  Service: Cardiovascular;  Laterality: N/A;  . WISDOM TOOTH EXTRACTION    . Wrist Surgery Left     Prior to Admission medications   Medication Sig Start Date End Date Taking? Authorizing Provider  atorvastatin (LIPITOR) 20 MG tablet Take 1 tablet (20 mg total) by mouth at bedtime. 05/23/15  Yes Arnetha Courser, MD  glimepiride (AMARYL) 1 MG tablet Take 1 tablet (1 mg total) by mouth daily with breakfast. (lower dose) 12/05/15  Yes Arnetha Courser, MD  latanoprost (XALATAN) 0.005 % ophthalmic solution Place 1 drop into both eyes at bedtime.    Yes Historical Provider,  MD  lisinopril (PRINIVIL,ZESTRIL) 10 MG tablet Take 1 tablet (10 mg total) by mouth daily. 02/04/16 05/04/16 Yes Wende Bushy, MD  metoprolol tartrate (LOPRESSOR) 25 MG tablet TAKE ONE (1) TABLET BY MOUTH TWO (2) TIMES DAILY 12/16/15  Yes Wende Bushy, MD  ranitidine (ZANTAC) 300 MG tablet Take 1 tablet (300 mg total) by mouth at bedtime. 09/19/15  Yes Arnetha Courser, MD  rivaroxaban (XARELTO) 20 MG TABS tablet Take 1 tablet (20 mg total) by mouth daily with supper. 12/16/15   Yes Arnetha Courser, MD  timolol (TIMOPTIC) 0.5 % ophthalmic solution Place 1 drop into both eyes daily.  02/11/15  Yes Historical Provider, MD  FLUARIX QUADRIVALENT 0.5 ML injection Inject as directed once. 01/03/16   Historical Provider, MD    Allergies Patient has no known allergies.  Family History  Problem Relation Age of Onset  . Cataracts Mother   . Heart attack Father   . Heart attack Brother   . Aneurysm Daughter     Social History Social History  Substance Use Topics  . Smoking status: Never Smoker  . Smokeless tobacco: Never Used  . Alcohol use No    Review of Systems Constitutional: No fever/chills Eyes: No visual changes. ENT: No sore throat. Cardiovascular: as above Respiratory: Denies shortness of breath. Gastrointestinal: No abdominal pain.  no vomiting.  No diarrhea.  No constipation. Genitourinary: Negative for dysuria. Musculoskeletal: Negative for back pain. Skin: Negative for rash. Neurological: Negative for headaches, focal weakness or numbness.  10-point ROS otherwise negative.  ____________________________________________   PHYSICAL EXAM:  VITAL SIGNS: ED Triage Vitals  Enc Vitals Group     BP 02/17/16 1054 (!) 152/96     Pulse Rate 02/17/16 1054 88     Resp 02/17/16 1054 18     Temp 02/17/16 1054 98.1 F (36.7 C)     Temp Source 02/17/16 1054 Oral     SpO2 02/17/16 1054 98 %     Weight 02/17/16 1055 171 lb (77.6 kg)     Height 02/17/16 1055 5\' 6"  (1.676 m)     Head Circumference --      Peak Flow --      Pain Score 02/17/16 1111 8     Pain Loc --      Pain Edu? --      Excl. in [place]? --     Constitutional: Alert and oriented. Well appearing and in no acute distress. Eyes: Conjunctivae are normal. PERRL. EOMI. Head: Atraumatic. Nose: No congestion/rhinnorhea. Mouth/Throat: Mucous membranes are moist.  Neck: No stridor.   Cardiovascular: Normal rate, regular rhythm. Grossly normal heart sounds.   Respiratory: Normal respiratory  effort.  No retractions. Lungs CTAB. Gastrointestinal: Soft and nontender. No distention.  Musculoskeletal: No lower extremity tenderness nor edema.  No joint effusions. Neurologic:  Normal speech and language. No gross focal neurologic deficits are appreciated.  Skin:  Skin is warm, dry and intact. No rash noted. Psychiatric: Mood and affect are normal. Speech and behavior are normal.  ____________________________________________   LABS (all labs ordered are listed, but only abnormal results are displayed)  Labs Reviewed  BASIC METABOLIC PANEL - Abnormal; Notable for the following:       Result Value   Glucose, Bld 171 (*)    All other components within normal limits  PROTIME-INR - Abnormal; Notable for the following:    Prothrombin Time 22.0 (*)    All other components within normal limits  CBC  TROPONIN I  ____________________________________________  EKG  ED ECG REPORT I, Doran Stabler, the attending physician, personally viewed and interpreted this ECG.   Date: 02/17/2016  EKG Time: 1051  Rate: 85  Rhythm: atrial fibrillation, rate 85  Axis: Normal  Intervals:none  ST&T Change: no ST segment elevation or depression. No abnormal T-wave inversion.  ____________________________________________  [id number]  DG Chest 2 View (Final result)  Result time 02/17/16 11:19:28  Final result by Fabio Neighbors., MD (02/17/16 11:19:28)           Narrative:   CLINICAL DATA: Chest pressure.  EXAM: CHEST 2 VIEW  COMPARISON: No recent prior .  FINDINGS: Mediastinum and hilar structures are normal. Cardiomegaly with normal pulmonary vascularity. Right chest pleural-parenchymal thickening is noted. This is most likely related to scarring. Tiny right pleural effusion cannot be excluded Tiny amount of fluid in the right major fissure cannot be excluded. No pneumothorax.  IMPRESSION: 1. Right-sided pleural thickening, most likely related scarring. Tiny right  pleural effusion and tiny amount of fluid in the right major fissure cannot be excluded.  2. Cardiomegaly. No pulmonary venous congestion .   Electronically Signed By: Marcello Moores Register On: 02/17/2016 11:19          ____________________________________________   PROCEDURES  Procedure(s) performed:   Procedures  Critical Care performed:   ____________________________________________   INITIAL IMPRESSION / ASSESSMENT AND PLAN / ED COURSE  Pertinent labs & imaging results that were available during my care of the patient were reviewed by me and considered in my medical decision making (see chart for details).    Clinical Course   ----------------------------------------- 2:36 PM on 02/17/2016 -----------------------------------------  Patient without any chest pain at this time. Multiple risk factors and a concerning story for unstable angina. Discussed case with Dr. Fletcher Anon.  Signed out to Dr. Tressia Miners of the medicine service. Patient will be admitted to the hospital. Discussed this with the family as well as the patient were understanding and wanted to comply.   ____________________________________________   FINAL CLINICAL IMPRESSION(S) / ED DIAGNOSES  Atrial fibrillation. Unstable angina.    NEW MEDICATIONS STARTED DURING THIS VISIT:  New Prescriptions   No medications on file     Note:  This document was prepared using Dragon voice recognition software and may include unintentional dictation errors.    Orbie Pyo, MD 02/17/16 [phone]

## 2016-02-18 DIAGNOSIS — I10 Essential (primary) hypertension: Secondary | ICD-10-CM | POA: Diagnosis not present

## 2016-02-18 DIAGNOSIS — I4891 Unspecified atrial fibrillation: Secondary | ICD-10-CM

## 2016-02-18 DIAGNOSIS — I209 Angina pectoris, unspecified: Secondary | ICD-10-CM | POA: Diagnosis not present

## 2016-02-18 DIAGNOSIS — E119 Type 2 diabetes mellitus without complications: Secondary | ICD-10-CM | POA: Diagnosis not present

## 2016-02-18 DIAGNOSIS — I484 Atypical atrial flutter: Secondary | ICD-10-CM | POA: Diagnosis not present

## 2016-02-18 DIAGNOSIS — I480 Paroxysmal atrial fibrillation: Secondary | ICD-10-CM | POA: Diagnosis not present

## 2016-02-18 DIAGNOSIS — R079 Chest pain, unspecified: Secondary | ICD-10-CM | POA: Diagnosis not present

## 2016-02-18 DIAGNOSIS — E785 Hyperlipidemia, unspecified: Secondary | ICD-10-CM

## 2016-02-18 LAB — BASIC METABOLIC PANEL
ANION GAP: 6 (ref 5–15)
BUN: 19 mg/dL (ref 6–20)
CALCIUM: 9 mg/dL (ref 8.9–10.3)
CO2: 24 mmol/L (ref 22–32)
Chloride: 109 mmol/L (ref 101–111)
Creatinine, Ser: 0.71 mg/dL (ref 0.44–1.00)
GFR calc Af Amer: >60 mL/min (ref >60)
GLUCOSE: 147 mg/dL — AB (ref 65–99)
POTASSIUM: 3.9 mmol/L (ref 3.5–5.1)
Sodium: 139 mmol/L (ref 135–145)

## 2016-02-18 LAB — ECHOCARDIOGRAM COMPLETE
HEIGHTINCHES: 66 in
WEIGHTICAEL: 2646.4 [oz_av]

## 2016-02-18 LAB — CBC
HEMATOCRIT: 40.6 % (ref 35.0–47.0)
HEMOGLOBIN: 13.8 g/dL (ref 12.0–16.0)
MCH: 31.6 pg (ref 26.0–34.0)
MCHC: 34 g/dL (ref 32.0–36.0)
MCV: 93 fL (ref 80.0–100.0)
Platelets: 152 10*3/uL (ref 150–440)
RBC: 4.36 MIL/uL (ref 3.80–5.20)
RDW: 13.6 % (ref 11.5–14.5)
WBC: 5.6 10*3/uL (ref 3.6–11.0)

## 2016-02-18 LAB — GLUCOSE, CAPILLARY
GLUCOSE-CAPILLARY: 159 mg/dL — AB (ref 65–99)
Glucose-Capillary: 122 mg/dL — ABNORMAL HIGH (ref 65–99)
Glucose-Capillary: 160 mg/dL — ABNORMAL HIGH (ref 65–99)
Glucose-Capillary: 176 mg/dL — ABNORMAL HIGH (ref 65–99)

## 2016-02-18 LAB — TROPONIN I

## 2016-02-18 MED ORDER — AMIODARONE HCL 200 MG PO TABS
200.0000 mg | ORAL_TABLET | Freq: Two times a day (BID) | ORAL | Status: DC
  Administered 2016-02-18 – 2016-02-19 (×2): 200 mg via ORAL
  Filled 2016-02-18 (×2): qty 1

## 2016-02-18 MED ORDER — METOPROLOL TARTRATE 25 MG PO TABS
25.0000 mg | ORAL_TABLET | Freq: Two times a day (BID) | ORAL | Status: DC
  Administered 2016-02-18 – 2016-02-19 (×2): 25 mg via ORAL
  Filled 2016-02-18 (×2): qty 1

## 2016-02-18 MED ORDER — METOPROLOL TARTRATE 50 MG PO TABS: 50.0000 mg | ORAL_TABLET | Freq: Two times a day (BID) | ORAL | Status: DC

## 2016-02-18 MED ORDER — AMIODARONE HCL 200 MG PO TABS: 200.0000 mg | ORAL_TABLET | Freq: Two times a day (BID) | ORAL | 0 refills | Status: DC

## 2016-02-18 NOTE — Progress Notes (Signed)
CCMD called with low HR of 39.   Patient running afib in 50's at this time.  Christell Faith, PA was paged and he will come to see the patient.  Patient does not appear symptomatic at this time.

## 2016-02-18 NOTE — Progress Notes (Signed)
Patient Name: Amber Wolfe Date of Encounter: 02/18/2016  Primary Cardiologist: [facility] Problem List     Principal Problem:   Atypical atrial flutter [facility]) Active Problems:   Palpitations   Accelerated hypertension   Diabetes mellitus with neuropathy (HCC)   Chest pain   Mitral regurgitation   Hyperlipidemia     Subjective   Feels much better this morning. No palpitations this morning. Has not ambulated yet. Heart rate well controlled, remains in Afib.   Inpatient Medications    Scheduled Meds: . amiodarone  400 mg Oral BID  . aspirin EC  81 mg Oral Daily  . atorvastatin  20 mg Oral QHS  . famotidine  20 mg Oral Daily  . insulin aspart  0-5 Units Subcutaneous QHS  . insulin aspart  0-9 Units Subcutaneous TID WC  . latanoprost  1 drop Both Eyes QHS  . lisinopril  10 mg Oral Daily  . metoprolol tartrate  25 mg Oral Q6H  . rivaroxaban  20 mg Oral Q supper  . sodium chloride flush  3 mL Intravenous Q12H  . sodium chloride flush  3 mL Intravenous Q12H  . timolol  1 drop Both Eyes Daily   Continuous Infusions:  PRN Meds: sodium chloride, acetaminophen **OR** acetaminophen, ondansetron **OR** ondansetron (ZOFRAN) IV, oxyCODONE, sodium chloride flush   Vital Signs    Vitals:   02/17/16 1330 02/17/16 1500 02/17/16 1931 02/18/16 0409  BP: (!) 156/94  (!) 153/70 (!) 141/73  Pulse: 82  89 70  Resp: 19  16 16   Temp:   98.6 F (37 C) 98.3 F (36.8 C)  TempSrc:   Oral   SpO2: 97%  99% 95%  Weight:  165 lb 6.4 oz (75 kg)    Height:  5\' 6"  (1.676 m)      Intake/Output Summary (Last 24 hours) at 02/18/16 0918 Last data filed at 02/18/16 0422  Gross per 24 hour  Intake              240 ml  Output                0 ml  Net              240 ml   Filed Weights   02/17/16 1055 02/17/16 1111 02/17/16 1500  Weight: 171 lb (77.6 kg) 125 lb (56.7 kg) 165 lb 6.4 oz (75 kg)    Physical Exam    GEN: Well nourished, well developed, in no acute distress.   HEENT: Grossly normal.  Neck: Supple, no JVD, carotid bruits, or masses. Cardiac: Irregularly irregular, no murmurs, rubs, or gallops. No clubbing, cyanosis, edema.  Radials/DP/PT 2+ and equal bilaterally.  Respiratory:  Faint crackles along bilateral base. GI: Soft, nontender, nondistended, BS + x 4. MS: no deformity or atrophy. Skin: warm and dry, no rash. Neuro:  Strength and sensation are intact. Psych: AAOx3.  Normal affect.  Labs    CBC  Recent Labs  02/17/16 1058 02/18/16 0355  WBC 5.0 5.6  HGB 14.2 13.8  HCT 41.7 40.6  MCV 93.3 93.0  PLT 169 0000000   Basic Metabolic Panel  Recent Labs  02/17/16 1058 02/18/16 0355  NA 138 139  K 3.9 3.9  CL 106 109  CO2 26 24  GLUCOSE 171* 147*  BUN 15 19  CREATININE 0.76 0.71  CALCIUM 9.0 9.0   Liver Function Tests  Recent Labs  02/17/16 1058  AST 21  ALT 12*  ALKPHOS  71  BILITOT 0.8  PROT 6.8  ALBUMIN 3.7   No results for input(s): LIPASE, AMYLASE in the last 72 hours. Cardiac Enzymes  Recent Labs  02/17/16 1553 02/17/16 2119 02/18/16 0355  TROPONINI <0.03 <0.03 <0.03   BNP Invalid input(s): POCBNP D-Dimer No results for input(s): DDIMER in the last 72 hours. Hemoglobin A1C No results for input(s): HGBA1C in the last 72 hours. Fasting Lipid Panel  Recent Labs  02/17/16 1553  CHOL 138  HDL 39*  LDLCALC 72  TRIG 135  CHOLHDL 3.5   Thyroid Function Tests  Recent Labs  02/17/16 1058  TSH 2.747    Telemetry    Afib/flutter, 70's bpm - Personally Reviewed  ECG    n/a - Personally Reviewed  Radiology    Dg Chest 2 View  Result Date: 02/17/2016 CLINICAL DATA:  Chest pressure. EXAM: CHEST  2 VIEW COMPARISON:  No recent prior . FINDINGS: Mediastinum and hilar structures are normal. Cardiomegaly with normal pulmonary vascularity. Right chest pleural-parenchymal thickening is noted. This is most likely related to scarring. Tiny right pleural effusion cannot be excluded Tiny amount of fluid  in the right major fissure cannot be excluded. No pneumothorax. IMPRESSION: 1. Right-sided pleural thickening, most likely related scarring. Tiny right pleural effusion and tiny amount of fluid in the right major fissure cannot be excluded. 2. Cardiomegaly.  No pulmonary venous congestion . Electronically Signed   By: [place]   On: 02/17/2016 11:19    Cardiac Studies   Echo pending this admission  Patient Profile     82 year old female with history of Afib/flutter on Xarelto s/p recent successful TEE/DCCV in [id number], chronic systolic CHF with an EF of 40% by TTE 06/2015, mild to moderate mitral and trcuspid regurgitation, HTN, HLD, DM2, GERD, and glaucoma who [facility] with tachy-palpitations and chest pressure.   Assessment & Plan    1. Paroxysmal atypical atrial flutter/PAF: -Currently rate controlled in the 70's bpm appearing to be in Afib at this time -Asymptomatic -Ambulate this morning, if well controlled would be ok for discharge from cardiac standpoint -Consolidate Lopressor to 50 mg bid -Continue amiodarone 400 mg bid x 1 week, then 200 mg bid x 1 week, then 200 mg daily thereafter -Continue Xarelto -If she does not chemically cardiovert with amiodarone can plan for outpatient DCCV with amiodarone on board at this time -CHADS2VASc at least 6 (CHF, HTN, age x 2, DM, female)  2. Chest pressure; -No further chest pain/pressure -Recent low risk nuclear stress test 05/2015 -If her chest pressure returns with adequate rate control she may need outpatient diagnostic cardiac cath to evaluate for ischemia -On Xarelto in place of ASA -Echo pending -Beta blocker as above  3. Possible systolic dysfunction: -Echo from 06/2015 with EF of 40%, TEE 07/2015 with normal LV systolic function -Faint crackles along bilateral bases, cannot rule out small amount of volume overload while in Afib -Add prn Lasix 20 mg for SOB/LE swelling -Check echo this admission to evaluate  LV systolic function -If reduced she will need further ischemia workup  4. Accelerated HTN: -Improved -Continue current medications  5. HLD: -Lipitor  6. DM: -Per IM   Signed, Christell Faith, PA-C [facility] Pager: [phone] 02/18/2016, 9:18 AM

## 2016-02-18 NOTE — Progress Notes (Signed)
   Telemetry reviewed. Patient remains in Afib, heart rates have decreased from 70's this morning to the 50's bpm. Asymptomatic. Perhaps the combination of amiodarone 400 mg bid with Lopressor 50 mg (not yet received today, has received 25 mg this morning) along with timolol drops is leading to bradycardia as she starts to load with amiodarone. Will decrease amiodarone to 200 mg bid and decrease Lopressor to 25 mg bid from 50 mg bid. Recommend monitoring on telemetry this afternoon to assess heart rate and appropriate chronotropic response. No indication for transcutaneous pacing, atropine, epi, or venous temp wire.

## 2016-02-18 NOTE — Discharge Instructions (Addendum)
Resume diet and activity as before ° ° °

## 2016-02-19 ENCOUNTER — Other Ambulatory Visit: Payer: Self-pay | Admitting: Cardiovascular Disease

## 2016-02-19 DIAGNOSIS — I484 Atypical atrial flutter: Secondary | ICD-10-CM | POA: Diagnosis not present

## 2016-02-19 DIAGNOSIS — E1140 Type 2 diabetes mellitus with diabetic neuropathy, unspecified: Secondary | ICD-10-CM

## 2016-02-19 DIAGNOSIS — I10 Essential (primary) hypertension: Secondary | ICD-10-CM | POA: Diagnosis not present

## 2016-02-19 DIAGNOSIS — R079 Chest pain, unspecified: Secondary | ICD-10-CM | POA: Diagnosis not present

## 2016-02-19 DIAGNOSIS — I209 Angina pectoris, unspecified: Secondary | ICD-10-CM | POA: Diagnosis not present

## 2016-02-19 DIAGNOSIS — I4891 Unspecified atrial fibrillation: Secondary | ICD-10-CM | POA: Diagnosis not present

## 2016-02-19 LAB — GLUCOSE, CAPILLARY: Glucose-Capillary: 154 mg/dL — ABNORMAL HIGH (ref 65–99)

## 2016-02-19 MED ORDER — METOPROLOL TARTRATE 25 MG PO TABS: 25.0000 mg | ORAL_TABLET | Freq: Two times a day (BID) | ORAL | 0 refills | Status: DC

## 2016-02-19 MED ORDER — AMIODARONE HCL 200 MG PO TABS: 200.0000 mg | ORAL_TABLET | Freq: Two times a day (BID) | ORAL | 0 refills | Status: DC

## 2016-02-19 MED ORDER — LISINOPRIL 20 MG PO TABS
20.0000 mg | ORAL_TABLET | Freq: Every day | ORAL | Status: DC
  Administered 2016-02-19: 20 mg via ORAL
  Filled 2016-02-19: qty 1

## 2016-02-19 MED ORDER — FUROSEMIDE 20 MG PO TABS: 20.0000 mg | ORAL_TABLET | Freq: Every day | ORAL | 3 refills | Status: DC | PRN

## 2016-02-19 NOTE — Progress Notes (Signed)
Patient discharged per MD order and hospital protocol. Patient verbalized understanding of discharge instructions, medications and follow up appointments. Patient given all belongings. Patient escorted via wheelchair with volunteer off the unit.

## 2016-02-19 NOTE — Progress Notes (Signed)
[place] [facility] [place] NAME: Amber Wolfe    MR#:  [id number]  DATE OF BIRTH:  02-23-1932  SUBJECTIVE:  CHIEF COMPLAINT:   Chief Complaint  Patient presents with  . Chest Pain   NO further CP but palpitations  REVIEW OF SYSTEMS:    Review of Systems  Constitutional: Negative for chills and fever.  HENT: Negative for sore throat.   Eyes: Negative for blurred vision, double vision and pain.  Respiratory: Negative for cough, hemoptysis, shortness of breath and wheezing.   Cardiovascular: Positive for palpitations. Negative for chest pain, orthopnea and leg swelling.  Gastrointestinal: Negative for abdominal pain, constipation, diarrhea, heartburn, nausea and vomiting.  Genitourinary: Negative for dysuria and hematuria.  Musculoskeletal: Negative for back pain and joint pain.  Skin: Negative for rash.  Neurological: Negative for sensory change, speech change, focal weakness and headaches.  Endo/Heme/Allergies: Does not bruise/bleed easily.  Psychiatric/Behavioral: Negative for depression. The patient is not nervous/anxious.     DRUG ALLERGIES:  No Known Allergies  VITALS:  Blood pressure (!) 160/82, pulse 79, temperature 98.2 F (36.8 C), temperature source Oral, resp. rate 18, height 5\' 6"  (1.676 m), weight 75 kg (165 lb 6.4 oz), SpO2 94 %.  PHYSICAL EXAMINATION:   Physical Exam  GENERAL:  82 y.o.-year-old patient lying in the bed with no acute distress.  EYES: Pupils equal, round, reactive to light and accommodation. No scleral icterus. Extraocular muscles intact.  HEENT: Head atraumatic, normocephalic. Oropharynx and nasopharynx clear.  NECK:  Supple, no jugular venous distention. No thyroid enlargement, no tenderness.  LUNGS: Normal breath sounds bilaterally, no wheezing, rales, rhonchi. No use of accessory muscles of respiration.  CARDIOVASCULAR: S1, S2 normal. No murmurs, rubs, or gallops.  ABDOMEN: Soft, nontender,  nondistended. Bowel sounds present. No organomegaly or mass.  EXTREMITIES: No cyanosis, clubbing or edema b/l.    NEUROLOGIC: Cranial nerves II through XII are intact. No focal Motor or sensory deficits b/l.   PSYCHIATRIC: The patient is alert and oriented x 3.  SKIN: No obvious rash, lesion, or ulcer.   LABORATORY PANEL:   CBC  Recent Labs Lab 02/18/16 0355  WBC 5.6  HGB 13.8  HCT 40.6  PLT 152   ------------------------------------------------------------------------------------------------------------------ Chemistries   Recent Labs Lab 02/17/16 1058 02/18/16 0355  NA 138 139  K 3.9 3.9  CL 106 109  CO2 26 24  GLUCOSE 171* 147*  BUN 15 19  CREATININE 0.76 0.71  CALCIUM 9.0 9.0  AST 21  --   ALT 12*  --   ALKPHOS 71  --   BILITOT 0.8  --    ------------------------------------------------------------------------------------------------------------------  Cardiac Enzymes  Recent Labs Lab 02/18/16 0355  TROPONINI <0.03   ------------------------------------------------------------------------------------------------------------------  RADIOLOGY:  Dg Chest 2 View  Result Date: 02/17/2016 CLINICAL DATA:  Chest pressure. EXAM: CHEST  2 VIEW COMPARISON:  No recent prior . FINDINGS: Mediastinum and hilar structures are normal. Cardiomegaly with normal pulmonary vascularity. Right chest pleural-parenchymal thickening is noted. This is most likely related to scarring. Tiny right pleural effusion cannot be excluded Tiny amount of fluid in the right major fissure cannot be excluded. No pneumothorax. IMPRESSION: 1. Right-sided pleural thickening, most likely related scarring. Tiny right pleural effusion and tiny amount of fluid in the right major fissure cannot be excluded. 2. Cardiomegaly.  No pulmonary venous congestion . Electronically Signed   By: Marcello Moores  Register   On: 02/17/2016 11:19     ASSESSMENT AND PLAN:  82 year old Caucasian female history of atrial  fibrillation status post DC cardioversion who is presenting with palpitations and chest pain  * Atrial fibrillation rate controlled ON amiodarone and metoprolol HR in 50s briefly Monitor on tele  * Chest pain Troponin normal Likely from Afib Cath if recurrent CP in spite of Afib treatment  * Type 2 diabetes non-insulin-requiring SSI  * Essential hypertension: Lisinopril, metoprolol   All the records are reviewed and case discussed with Care Management/Social Workerr. Management plans discussed with the patient, family and they are in agreement.  CODE STATUS: FULL  DVT Prophylaxis: SCDs  TOTAL TIME TAKING CARE OF THIS PATIENT: 30 minutes.   POSSIBLE D/C IN 1-2 DAYS, DEPENDING ON CLINICAL CONDITION.  [name] R M.D on 02/19/2016 at 6:58 AM  Between 7am to 6pm - Pager - [phone]  After 6pm go to [email or web address] - password [email or web address] [place]  Office  [phone]  CC: Primary care physician; Enid Derry, MD  Note: This dictation was prepared with Dragon dictation along with smaller phrase technology. Any transcriptional errors that result from this process are unintentional.

## 2016-02-19 NOTE — Progress Notes (Signed)
Patient Name: Franchelle Recio Date of Encounter: 02/19/2016  Primary Cardiologist: [facility] Problem List     Principal Problem:   Atypical atrial flutter [facility]) Active Problems:   Palpitations   Accelerated hypertension   Diabetes mellitus with neuropathy (HCC)   Chest pain   Mitral regurgitation   Hyperlipidemia     Subjective   Feels good this morning. Ready to go home. No further bradycardic episodes. Has ambulated without issues.   Inpatient Medications    Scheduled Meds: . amiodarone  200 mg Oral BID  . aspirin EC  81 mg Oral Daily  . atorvastatin  20 mg Oral QHS  . famotidine  20 mg Oral Daily  . insulin aspart  0-5 Units Subcutaneous QHS  . insulin aspart  0-9 Units Subcutaneous TID WC  . latanoprost  1 drop Both Eyes QHS  . lisinopril  10 mg Oral Daily  . metoprolol tartrate  25 mg Oral BID  . rivaroxaban  20 mg Oral Q supper  . sodium chloride flush  3 mL Intravenous Q12H  . sodium chloride flush  3 mL Intravenous Q12H  . timolol  1 drop Both Eyes Daily   Continuous Infusions:  PRN Meds: sodium chloride, acetaminophen **OR** acetaminophen, ondansetron **OR** ondansetron (ZOFRAN) IV, oxyCODONE, sodium chloride flush   Vital Signs    Vitals:   02/18/16 0409 02/18/16 1141 02/18/16 1953 02/19/16 0401  BP: (!) 141/73 137/61 112/74 (!) 160/82  Pulse: 70 60 63 79  Resp: 16 20 18 18   Temp: 98.3 F (36.8 C) 98 F (36.7 C) 97.8 F (36.6 C) 98.2 F (36.8 C)  TempSrc:  Oral Oral Oral  SpO2: 95% 98% 96% 94%  Weight:      Height:        Intake/Output Summary (Last 24 hours) at 02/19/16 0804 Last data filed at 02/19/16 0059  Gross per 24 hour  Intake              243 ml  Output                0 ml  Net              243 ml   Filed Weights   02/17/16 1055 02/17/16 1111 02/17/16 1500  Weight: 171 lb (77.6 kg) 125 lb (56.7 kg) 165 lb 6.4 oz (75 kg)    Physical Exam    GEN: Well nourished, well developed, in no acute distress.  HEENT:  Grossly normal.  Neck: Supple, no JVD, carotid bruits, or masses. Cardiac: Irregularly irregular, no murmurs, rubs, or gallops. No clubbing, cyanosis, edema.  Radials/DP/PT 2+ and equal bilaterally.  Respiratory:  Faint bilateral crackles. GI: Soft, nontender, nondistended, BS + x 4. MS: no deformity or atrophy. Skin: warm and dry, no rash. Neuro:  Strength and sensation are intact. Psych: AAOx3.  Normal affect.  Labs    CBC  Recent Labs  02/17/16 1058 02/18/16 0355  WBC 5.0 5.6  HGB 14.2 13.8  HCT 41.7 40.6  MCV 93.3 93.0  PLT 169 0000000   Basic Metabolic Panel  Recent Labs  02/17/16 1058 02/18/16 0355  NA 138 139  K 3.9 3.9  CL 106 109  CO2 26 24  GLUCOSE 171* 147*  BUN 15 19  CREATININE 0.76 0.71  CALCIUM 9.0 9.0   Liver Function Tests  Recent Labs  02/17/16 1058  AST 21  ALT 12*  ALKPHOS 71  BILITOT 0.8  PROT 6.8  ALBUMIN 3.7   No results for input(s): LIPASE, AMYLASE in the last 72 hours. Cardiac Enzymes  Recent Labs  02/17/16 1553 02/17/16 2119 02/18/16 0355  TROPONINI <0.03 <0.03 <0.03   BNP Invalid input(s): POCBNP D-Dimer No results for input(s): DDIMER in the last 72 hours. Hemoglobin A1C No results for input(s): HGBA1C in the last 72 hours. Fasting Lipid Panel  Recent Labs  02/17/16 1553  CHOL 138  HDL 39*  LDLCALC 72  TRIG 135  CHOLHDL 3.5   Thyroid Function Tests  Recent Labs  02/17/16 1058  TSH 2.747    Telemetry    Afib, 60's to 80's bpm, longest R-R interval 2 seconds, asymptomatic - Personally Reviewed  ECG    n/a - Personally Reviewed  Radiology    Dg Chest 2 View  Result Date: 02/17/2016 CLINICAL DATA:  Chest pressure. EXAM: CHEST  2 VIEW COMPARISON:  No recent prior . FINDINGS: Mediastinum and hilar structures are normal. Cardiomegaly with normal pulmonary vascularity. Right chest pleural-parenchymal thickening is noted. This is most likely related to scarring. Tiny right pleural effusion cannot be  excluded Tiny amount of fluid in the right major fissure cannot be excluded. No pneumothorax. IMPRESSION: 1. Right-sided pleural thickening, most likely related scarring. Tiny right pleural effusion and tiny amount of fluid in the right major fissure cannot be excluded. 2. Cardiomegaly.  No pulmonary venous congestion . Electronically Signed   By: Marcello Moores  Register   On: 02/17/2016 11:19    Cardiac Studies   Echo 02/17/2016: Study Conclusions  - Left ventricle: The cavity size was normal. There was mild   concentric hypertrophy. Systolic function was normal. The   estimated ejection fraction was in the range of 55% to 60%. Wall   motion was normal; there were no regional wall motion   abnormalities. The study is not technically sufficient to allow   evaluation of LV diastolic function. - Mitral valve: There was mild regurgitation. - Left atrium: The atrium was mildly dilated. - Right ventricle: Systolic function was normal. - Pulmonary arteries: Systolic pressure was mildly elevated. PA   peak pressure: 35 mm Hg (S).  Impressions:  - Rhythm is atrial fibrillation/flutter.  Patient Profile     82 year old female with history of Afib/flutter on Xarelto s/p recent successful TEE/DCCV in [id number], chronic systolic CHF with an EF of 40% by TTE 06/2015, mild to moderate mitral and trcuspid regurgitation, HTN, HLD, DM2, GERD, and glaucoma who presented to [facility] on 11/20 with tachy-palpitations and chest pressure.  Assessment & Plan    1. Paroxysmal atypical atrial flutter/PAF: -Held on 11/21 given brief episode of bradycardia into the 50's bpm -Amiodarone was decreased to 200 mg bid as well as decreasing metoprolol to 25 mg bid -Heart rates improved since afternoon of 11/21, currently in the 80's bpm at rest -Asymptomatic, ready to go home -Continue Lopressor to 25 mg bid -Continue amiodarone 200 mg bid x 1 week, then 200 mg daily thereafter -Continue Xarelto -If she does not chemically  cardiovert with amiodarone can plan for outpatient DCCV with amiodarone on board at this time -CHADS2VASc at least 6 (CHF, HTN, age x 2, DM, female)  2. Chest pressure; -No further chest pain/pressure -Recent low risk nuclear stress test 05/2015 -If her chest pressure returns with adequate rate control she may need outpatient diagnostic cardiac cath to evaluate for ischemia -On Xarelto in place of ASA -Echo as above -Beta blocker as above  3. Possible systolic dysfunction: -Echo from 06/2015  with EF of 40%, TEE 07/2015 with normal LV systolic function -Faint crackles along bilateral bases, cannot rule out small amount of volume overload while in Afib -Add prn Lasix 20 mg for SOB/LE swelling -Echo as above  4. Accelerated HTN: -Slightly elevated this morning -Increase lisinopril to 20 mg daily -Continue current medications  5. HLD: -Lipitor  6. DM: -Per IM  Signed, Christell Faith, PA-C [facility] Pager: [phone] 02/19/2016, 8:04 AM

## 2016-02-19 NOTE — Care Management (Signed)
Admitted to [facility] under observation status with the diagnosis of atypical flutter. Lives alone. Daughter is Ila Mcgill [phone]). Seen Dr. Sanda Klein about 6 weeks ago, next appointment 03/24/16. Prescriptions are filled at [place] store. No home health. No skilled facility. No home oxygen. Fell 4-5 weeks ago in [facility] parking lot. Good appetite. Takes care of all basic and instrumental activities of daily living herself, drives. Daughter will transport. Discharge to home today per Dr. Darvin Neighbours. No follow-up needs identified. Shelbie Ammons RN MSN CCM Care Management

## 2016-02-25 NOTE — Discharge Summary (Signed)
[place] [facility] [place] NAME: Amber Wolfe    MR#:  [id number]  DATE OF BIRTH:  03-22-1932  DATE OF ADMISSION:  02/17/2016 ADMITTING PHYSICIAN: Lytle Butte, MD  DATE OF DISCHARGE: 02/19/2016 11:37 AM  PRIMARY CARE PHYSICIAN: Enid Derry, MD   ADMISSION DIAGNOSIS:  Unstable angina ([place]) [I20.0] Atrial fibrillation, unspecified type ([place]) [I48.91]  DISCHARGE DIAGNOSIS:  Principal Problem:   Atypical atrial flutter ([place]) Active Problems:   Diabetes mellitus with neuropathy (HCC)   Hyperlipidemia   Chest pain   Palpitations   Mitral regurgitation   Accelerated hypertension   SECONDARY DIAGNOSIS:   Past Medical History:  Diagnosis Date  . Arthritis    In hands  . Atrial flutter ([place])    a. s/p successful TEE/DCCV 07/31/2015; b. on Xarelto; c. CHADS2VASc => 6 (CHF, HTN, age x 2, DM, female)  . Cataract   . Chronic systolic CHF (congestive heart failure) ([place])    a. echo 06/2015: EF 40%, basal HK, nl WM of mid and apical segments, mild to mod MR/TR; b. TEE 99991111: nl LV systolic function, mild to mod MR, trivial TR  . Diabetes mellitus without complication ([place])    Type II  . GERD (gastroesophageal reflux disease)   . Glaucoma    Right Eye  . Hyperlipidemia   . Hypertension   . Moderate mitral regurgitation    a. see echo from 06/2015 and TEE 07/2015  . PAF (paroxysmal atrial fibrillation) [facility])      ADMITTING HISTORY  Amber Wolfe  is a 82 y.o. female with a known history of Atrial fibrillation status post DC cardioversion in May of this year who is presenting with chest pain. She describes acute onset of chest pain which occurred after palpitations-this occurred at rest. States she was watching TV felt a fluttering sensation in her chest followed by chest pain which she described as retrosternal tightness/pressure 6/10 in intensity no worsening or relieving factors associated nausea/and sweating. This episode resolved only to  continue the next day. She is currently chest pain-free  HOSPITAL COURSE:   82 year old Caucasian female history of atrial fibrillation status post DC cardioversion who is presenting with palpitations and chest pain  * Atrial fibrillation rate controlled On amiodarone and metoprolol HR in 50s briefly Monitored on tele Amiodarone reduced in dose by cardiology Discharge home on anti coagulation with OP f/u in 1 week  * Chest pain Troponin normal Likely from Afib Cath if recurrent CP in spite of Afib treatment as outpatient  * Type 2 diabetes non-insulin-requiring SSI  * Essential hypertension: Lisinopril, metoprolol  Stable for discharge home  CONSULTS OBTAINED:  Treatment Team:  Wellington Hampshire, MD Lytle Butte, MD  DRUG ALLERGIES:  No Known Allergies  DISCHARGE MEDICATIONS:   Discharge Medication List as of 02/19/2016 10:52 AM    CONTINUE these medications which have CHANGED   Details  amiodarone (PACERONE) 200 MG tablet Take 1 tablet (200 mg total) by mouth 2 (two) times daily. 200 mg bid x 5 days, then 200 mg daily thereafter, Starting Wed 02/19/2016, Normal    metoprolol tartrate (LOPRESSOR) 25 MG tablet Take 1 tablet (25 mg total) by mouth 2 (two) times daily., Starting Wed 02/19/2016, Normal      CONTINUE these medications which have NOT CHANGED   Details  atorvastatin (LIPITOR) 20 MG tablet Take 1 tablet (20 mg total) by mouth at bedtime., Starting Thu 05/23/2015, Normal    glimepiride (AMARYL) 1  MG tablet Take 1 tablet (1 mg total) by mouth daily with breakfast. (lower dose), Starting Thu 12/05/2015, Normal    latanoprost (XALATAN) 0.005 % ophthalmic solution Place 1 drop into both eyes at bedtime. , Historical Med    lisinopril (PRINIVIL,ZESTRIL) 10 MG tablet Take 1 tablet (10 mg total) by mouth daily., Starting Tue 02/04/2016, Until Mon 05/04/2016, Normal    ranitidine (ZANTAC) 300 MG tablet Take 1 tablet (300 mg total) by mouth at bedtime., Starting Thu  09/19/2015, Normal    rivaroxaban (XARELTO) 20 MG TABS tablet Take 1 tablet (20 mg total) by mouth daily with supper., Starting Mon 12/16/2015, Normal    timolol (TIMOPTIC) 0.5 % ophthalmic solution Place 1 drop into both eyes daily. , Starting Mon 02/11/2015, Historical Med    FLUARIX QUADRIVALENT 0.5 ML injection Inject as directed once., Starting Fri 01/03/2016, Historical Med    furosemide (LASIX) 20 MG tablet Take 1 tablet (20 mg total) by mouth daily as needed., Starting Wed 02/19/2016, Until Tue 05/19/2016, Normal        Today   VITAL SIGNS:  Blood pressure (!) 143/94, pulse 85, temperature 98.2 F (36.8 C), temperature source Oral, resp. rate 18, height 5\' 6"  (1.676 m), weight 75 kg (165 lb 6.4 oz), SpO2 93 %.  I/O:  No intake or output data in the 24 hours ending 02/25/16 0309  PHYSICAL EXAMINATION:  Physical Exam  GENERAL:  82 y.o.-year-old patient lying in the bed with no acute distress.  LUNGS: Normal breath sounds bilaterally, no wheezing, rales,rhonchi or crepitation. No use of accessory muscles of respiration.  CARDIOVASCULAR: S1, S2 normal. No murmurs, rubs, or gallops.  ABDOMEN: Soft, non-tender, non-distended. Bowel sounds present. No organomegaly or mass.  NEUROLOGIC: Moves all 4 extremities. PSYCHIATRIC: The patient is alert and oriented x 3.  SKIN: No obvious rash, lesion, or ulcer.   DATA REVIEW:   CBC  Recent Labs Lab 02/18/16 0355  WBC 5.6  HGB 13.8  HCT 40.6  PLT 152    Chemistries   Recent Labs Lab 02/18/16 0355  NA 139  K 3.9  CL 109  CO2 24  GLUCOSE 147*  BUN 19  CREATININE 0.71  CALCIUM 9.0    Cardiac Enzymes  Recent Labs Lab 02/18/16 0355  TROPONINI <0.03    Microbiology Results  No results found for this or any previous visit.  RADIOLOGY:  No results found.  Follow up with PCP in 1 week.  Management plans discussed with the patient, family and they are in agreement.  CODE STATUS:  Code Status History    Date  Active Date Inactive Code Status Order ID Comments User Context   02/17/2016  2:12 PM 02/19/2016  2:43 PM Full Code [id number]  Lytle Butte, MD ED      TOTAL TIME TAKING CARE OF THIS PATIENT ON DAY OF DISCHARGE: more than 30 minutes.   [name] R M.D on 02/25/2016 at 3:09 AM  Between 7am to 6pm - Pager - [phone]  After 6pm go to [email or web address] - password [email or web address] [place] [facility]  Office  [phone]  CC: Primary care physician; Enid Derry, MD  Note: This dictation was prepared with Dragon dictation along with smaller phrase technology. Any transcriptional errors that result from this process are unintentional.

## 2016-02-26 ENCOUNTER — Ambulatory Visit: Payer: Medicare Other | Admitting: Family Medicine

## 2016-02-27 ENCOUNTER — Ambulatory Visit (INDEPENDENT_AMBULATORY_CARE_PROVIDER_SITE_OTHER): Payer: Medicare Other | Admitting: Cardiology

## 2016-02-27 ENCOUNTER — Encounter: Payer: Self-pay | Admitting: Cardiology

## 2016-02-27 VITALS — BP 140/82 | HR 70 | Ht 66.0 in | Wt 167.0 lb

## 2016-02-27 DIAGNOSIS — E785 Hyperlipidemia, unspecified: Secondary | ICD-10-CM

## 2016-02-27 DIAGNOSIS — I484 Atypical atrial flutter: Secondary | ICD-10-CM | POA: Diagnosis not present

## 2016-02-27 DIAGNOSIS — I10 Essential (primary) hypertension: Secondary | ICD-10-CM | POA: Diagnosis not present

## 2016-02-27 DIAGNOSIS — I4891 Unspecified atrial fibrillation: Secondary | ICD-10-CM

## 2016-02-27 NOTE — Patient Instructions (Signed)
Follow-Up: Your physician recommends that you schedule a follow-up appointment with Christell Faith PA the week of December 18th.   It was a pleasure seeing you today here in the office. Please do not hesitate to give [place] a call back if you have any further questions. [place], BSN

## 2016-02-27 NOTE — Progress Notes (Signed)
Cardiology Office Note   Date:  02/27/2016   ID:  Amber Wolfe, DOB Aug 13, 1931, MRN [id number]  Referring Doctor:  Enid Derry, MD   Cardiologist:   Wende Bushy, MD   Reason for consultation:  Chief Complaint  Patient presents with  . other    Follow up from [facility]; a-flutter, discuss Echo. Meds reviewed by the pt. verbally.       History of Present Illness: Amber Wolfe is a 82 y.o. female who presents for follow-up afib  Since last visit, Patient was admitted to the hospital 02/17/2016 for chest pain. It turned out to be related to A. fib with RVR. She was started on amiodarone. Doses of metoprolol and amiodarone were further adjusted due to episodes of bradycardia.  Currently, patient is doing well overall. Chest pain is completely resolved. She thinks that she can feel a difference now that she is back in A. fib as compared to when she was staying in sinus rhythm. She is open to the idea of cardioversion if she remains in A. Fib.  No PND, orthopnea, edema. No loss of consciousness.  ROS:  Please see the history of present illness. Aside from mentioned under HPI, all other systems are reviewed and negative.     Past Medical History:  Diagnosis Date  . Arthritis    In hands  . Atrial flutter ([place])    a. s/p successful TEE/DCCV 07/31/2015; b. on Xarelto; c. CHADS2VASc => 6 (CHF, HTN, age x 2, DM, female)  . Cataract   . Chronic systolic CHF (congestive heart failure) ([place])    a. echo 06/2015: EF 40%, basal HK, nl WM of mid and apical segments, mild to mod MR/TR; b. TEE 99991111: nl LV systolic function, mild to mod MR, trivial TR  . Diabetes mellitus without complication ([place])    Type II  . GERD (gastroesophageal reflux disease)   . Glaucoma    Right Eye  . Hyperlipidemia   . Hypertension   . Moderate mitral regurgitation    a. see echo from 06/2015 and TEE 07/2015  . PAF (paroxysmal atrial fibrillation) ([place])     Past Surgical History:  Procedure  Laterality Date  . CATARACT EXTRACTION Bilateral   . CESAREAN SECTION    . ELECTROPHYSIOLOGIC STUDY N/A 07/31/2015   Procedure: CARDIOVERSION;  Surgeon: Wende Bushy, MD;  Location: [facility] ORS;  Service: Cardiovascular;  Laterality: N/A;  . TEE WITHOUT CARDIOVERSION N/A 07/31/2015   Procedure: TRANSESOPHAGEAL ECHOCARDIOGRAM (TEE);  Surgeon: Wende Bushy, MD;  Location: [facility] ORS;  Service: Cardiovascular;  Laterality: N/A;  . WISDOM TOOTH EXTRACTION    . Wrist Surgery Left      reports that she has never smoked. She has never used smokeless tobacco. She reports that she does not drink alcohol or use drugs.   family history includes Aneurysm in her daughter; Cataracts in her mother; Heart attack in her brother and father.   Current Outpatient Prescriptions  Medication Sig Dispense Refill  . amiodarone (PACERONE) 200 MG tablet Take 1 tablet (200 mg total) by mouth 2 (two) times daily. 200 mg bid x 5 days, then 200 mg daily thereafter 60 tablet 0  . atorvastatin (LIPITOR) 20 MG tablet Take 1 tablet (20 mg total) by mouth at bedtime. 30 tablet 6  . FLUARIX QUADRIVALENT 0.5 ML injection Inject as directed once.    . furosemide (LASIX) 20 MG tablet Take 1 tablet (20 mg total) by mouth daily as needed. 30 tablet 3  .  glimepiride (AMARYL) 1 MG tablet Take 1 tablet (1 mg total) by mouth daily with breakfast. (lower dose) 30 tablet 5  . latanoprost (XALATAN) 0.005 % ophthalmic solution Place 1 drop into both eyes at bedtime.     Marland Kitchen lisinopril (PRINIVIL,ZESTRIL) 10 MG tablet Take 1 tablet (10 mg total) by mouth daily. 30 tablet 6  . metoprolol tartrate (LOPRESSOR) 25 MG tablet Take 1 tablet (25 mg total) by mouth 2 (two) times daily. 60 tablet 0  . ranitidine (ZANTAC) 300 MG tablet Take 1 tablet (300 mg total) by mouth at bedtime. 30 tablet 5  . rivaroxaban (XARELTO) 20 MG TABS tablet Take 1 tablet (20 mg total) by mouth daily with supper. 30 tablet 5  . timolol (TIMOPTIC) 0.5 % ophthalmic solution Place 1  drop into both eyes daily.      No current facility-administered medications for this visit.     Allergies: Patient has no known allergies.    PHYSICAL EXAM: VS:  BP 140/82 (BP Location: Left Arm, Patient Position: Sitting, Cuff Size: Normal)   Pulse 70   Ht 5\' 6"  (1.676 m)   Wt 167 lb (75.8 kg)   BMI 26.95 kg/m  , Body mass index is 26.95 kg/m. Wt Readings from Last 3 Encounters:  02/27/16 167 lb (75.8 kg)  02/17/16 165 lb 6.4 oz (75 kg)  02/04/16 171 lb (77.6 kg)    GENERAL:  well developed, well nourished, obese, not in acute distress HEENT: normocephalic, pink conjunctivae, anicteric sclerae, no xanthelasma, normal dentition, oropharynx clear NECK:  no neck vein engorgement, JVP normal, no hepatojugular reflux, carotid upstroke brisk and symmetric, no bruit, no thyromegaly, no lymphadenopathy LUNGS:  good respiratory effort, clear to auscultation bilaterally CV:  PMI not displaced, no thrills, no lifts, S1, S2 within normal limits, no palpable S3 or S4, no murmurs, no rubs, no gallops ABD:  Soft, nontender, nondistended, normoactive bowel sounds, no abdominal aortic bruit, no hepatomegaly, no splenomegaly MS: nontender back, no kyphosis, no scoliosis, no joint deformities EXT:  2+ DP/PT pulses, no edema, no varicosities, no cyanosis, no clubbing SKIN: warm, nondiaphoretic, normal turgor, no ulcers NEUROPSYCH: alert, oriented to person, place, and time, sensory/motor grossly intact, normal mood, appropriate affect  Recent Labs: 02/17/2016: ALT 12; TSH 2.747 02/18/2016: BUN 19; Creatinine, Ser 0.71; Hemoglobin 13.8; Platelets 152; Potassium 3.9; Sodium 139   Lipid Panel    Component Value Date/Time   CHOL 138 02/17/2016 1553   CHOL 159 05/15/2015 1619   TRIG 135 02/17/2016 1553   TRIG 124 05/15/2015 1619   HDL 39 (L) 02/17/2016 1553   CHOLHDL 3.5 02/17/2016 1553   VLDL 27 02/17/2016 1553   VLDL 25 05/15/2015 1619   LDLCALC 72 02/17/2016 1553     Other studies  Reviewed:  EKG:   EKG from 02/27/2016 was personally reviewed by me and it revealed atrial fibrillation, ventricular rate 70 BPM.  EKG from 08/06/2015 was personally reviewed by me and it showed sinus rhythm, 65 BPM.  EKG from 07/22/2015 was personally reviewed by me and it showed atrial flutter with variable conduction, ventricular rate of 111 bpm. Nonspecific ST-T wave changes.  The ekg from 06/18/2015  was personally reviewed by me and it reveals atrial flutter, with variable AV block, ventricular rate of 79 BPM.  EKG from 06/11/2015 showed atrial fibrillation, 124 ventricular rate. Nonspecific ST-T wave changes.  EKG from 04/20/2014 showed sinus rhythm.  Additional studies/ records that were reviewed personally reviewed by me today include:  Echocardiogram  07/10/2015: Left ventricle: The cavity size was normal. Wall thickness was  normal. The estimated ejection fraction was approximately 40%.  There is hypokinesis in the basal segments, normal wall motion in  the mid and apical segments. - Mitral valve: Calcified annulus. There was mild to moderate  regurgitation. - Left atrium: The atrium was moderately dilated. - Right atrium: The atrium was moderately dilated. - Tricuspid valve: There was mild-moderate regurgitation.  Holter 07/10/2015: Overall rhythm appears to be atrial flutter. Average heart rate of 78 BPM.  Minimum heart rate of 47 bpm at 4:53 AM 07/11/2015. 3% of total number of beats were in bradycardia. Maximum heart rate of 189 bpm at 10:19 AM of 07/10/2015. 11% of the total number of beats were in tachycardia.  Pharmacologic nuclear stress test 06/21/2015: Pharmacological myocardial perfusion imaging study with no significant ischemia Normal wall motion, EF estimated at 60% No EKG changes concerning for ischemia at peak stress or in recovery. Baseline EKG with atrial fibrillation. Rare PVC noted. Low risk scan  TEE 07/31/2015:  Study Conclusions  -  Left atrium: No evidence of thrombus in the atrial cavity or  appendage. Cardioversion with 200 J  ASSESSMENT AND PLAN:  Atrial flutter, as noted on Holter monitor. Average of 78 BPM. Maximum heart rate of 189 BPM, 11% of total number of beats in tachycardia. Atrial fibrillation  Status post cardioversion 07/31/2015, 200 J Back in atrial fibrillation, ventricular rate controlled. Continue amiodarone and metoprolol for now. Continue oral anticoagulation with Xarelto 20mg  po qd. Recommend follow-up with Christell Faith, PA week off of December 18. This is to recheck rhythm, do EKG. If she remains in atrial fibrillation, and wants to proceed with cardioversion, we should set that up first week of January.  Patient advised to follow with PCP and consider sleep study.  Hypertension BP is well controlled. Continue monitoring BP. Continue current medical therapy and lifestyle changes.  Hyperlipidemia continue statin therapy. PCP following labs.  Current medicines are reviewed at length with the patient today.  The patient does not have concerns regarding medicines.  Labs/ tests ordered today include:  Orders Placed This Encounter  Procedures  . EKG 12-Lead    I had a lengthy and detailed discussion with the patient regarding diagnoses, prognosis, diagnostic options, treatment options, and side effects of medications.   I counseled the patient on importance of lifestyle modification including heart healthy diet, regular physical activity.   Disposition:   FU with Christell Faith, PA in 2-3 weeks  Signed, Wende Bushy, MD  02/27/2016 1:04 PM    [place]

## 2016-03-19 ENCOUNTER — Encounter: Payer: Self-pay | Admitting: Nurse Practitioner

## 2016-03-19 ENCOUNTER — Ambulatory Visit (INDEPENDENT_AMBULATORY_CARE_PROVIDER_SITE_OTHER): Payer: Medicare Other | Admitting: Nurse Practitioner

## 2016-03-19 VITALS — BP 122/80 | HR 80 | Ht 66.0 in | Wt 170.5 lb

## 2016-03-19 DIAGNOSIS — E782 Mixed hyperlipidemia: Secondary | ICD-10-CM | POA: Diagnosis not present

## 2016-03-19 DIAGNOSIS — I4892 Unspecified atrial flutter: Secondary | ICD-10-CM | POA: Diagnosis not present

## 2016-03-19 DIAGNOSIS — I10 Essential (primary) hypertension: Secondary | ICD-10-CM | POA: Diagnosis not present

## 2016-03-19 DIAGNOSIS — I4891 Unspecified atrial fibrillation: Secondary | ICD-10-CM | POA: Diagnosis not present

## 2016-03-19 DIAGNOSIS — Z01812 Encounter for preprocedural laboratory examination: Secondary | ICD-10-CM | POA: Diagnosis not present

## 2016-03-19 NOTE — Progress Notes (Signed)
Cardiology Clinic Note   Patient Name: Amber Wolfe Date of Encounter: 03/19/2016  Primary Care Provider:  Enid Derry, MD Primary Cardiologist:  A. Yvone Neu, MD   Patient Profile    82 year old female with a prior history of paroxysmal atrial flutter status post TEE cardioversion in May 2017, CHF with normal LV function in May 2017, hypertension, hyperlipidemia, diabetes, moderate mitral regurgitation, and paroxysmal atrial fibrillation noted in November 2017, who presents for follow-up.  Past Medical History    Past Medical History:  Diagnosis Date  . Arthritis    In hands  . Atrial flutter ([place])    a. s/p successful TEE/DCCV 07/31/2015; b. on Xarelto; c. CHADS2VASc => 6 (CHF, HTN, age x 2, DM, female)  . Cataract   . Chronic systolic CHF (congestive heart failure) ([place])    a. echo 06/2015: EF 40%, basal HK, nl WM of mid and apical segments, mild to mod MR/TR; b. TEE 99991111: nl LV systolic function, mild to mod MR, trivial TR  . Diabetes mellitus without complication ([place])    Type II  . GERD (gastroesophageal reflux disease)   . Glaucoma    Right Eye  . Hyperlipidemia   . Hypertension   . Moderate mitral regurgitation    a. see echo from 06/2015 and TEE 07/2015  . PAF (paroxysmal atrial fibrillation) ([place])    a.  01/2016 Admitted to [facility] with PAF -->amio added.   Past Surgical History:  Procedure Laterality Date  . CATARACT EXTRACTION Bilateral   . CESAREAN SECTION    . ELECTROPHYSIOLOGIC STUDY N/A 07/31/2015   Procedure: CARDIOVERSION;  Surgeon: Wende Bushy, MD;  Location: [facility] ORS;  Service: Cardiovascular;  Laterality: N/A;  . TEE WITHOUT CARDIOVERSION N/A 07/31/2015   Procedure: TRANSESOPHAGEAL ECHOCARDIOGRAM (TEE);  Surgeon: Wende Bushy, MD;  Location: [facility] ORS;  Service: Cardiovascular;  Laterality: N/A;  . WISDOM TOOTH EXTRACTION    . Wrist Surgery Left     Allergies  No Known Allergies  History of Present Illness    82 year old female with above  complex past medical history including atrial flutter status post TEE and cardioversion May 2017. She has been anticoagulated on Xarelto. She also has a history of moderate mitral regurgitation, hypertension, hyperlipidemia, diabetes, mild LV dysfunction with EF of 40% was subsequently normalization by May 2017, and glaucoma. She was recently admitted to [facility] in late November with chest pain and found to be in atrial fibrillation with a rapid ventricular response. Anticoagulation was continued and she was placed on amiodarone therapy. She did have some bradycardia and amiodarone along with beta blocker dose as needed to be adjusted. She followed up in clinic on November 30 and she was in rate controlled atrial flutter with variable block.  It was felt that if she remained aflutter/fib over the subsequent few wks, DCCV could be arranged for early 2018.    Since her last visit, she has continued to have intermittent palpitations. She also notes generalized fatigue and low energy. She has not been having any chest pain, dyspnea, PND, orthopnea, dizziness, syncope, edema, or early satiety. She is in rate controlled atrial flutter with variable response today. She is interested in pursuing cardioversion.  Home Medications    Prior to Admission medications   Medication Sig Start Date End Date Taking? Authorizing Provider  amiodarone (PACERONE) 200 MG tablet Take 1 tablet (200 mg total) by mouth 2 (two) times daily. 200 mg bid x 5 days, then 200 mg daily thereafter 02/19/16  Yes [name], MD  atorvastatin (LIPITOR) 20 MG tablet Take 1 tablet (20 mg total) by mouth at bedtime. 05/23/15  Yes Arnetha Courser, MD  FLUARIX QUADRIVALENT 0.5 ML injection Inject as directed once. 01/03/16  Yes Historical Provider, MD  furosemide (LASIX) 20 MG tablet Take 1 tablet (20 mg total) by mouth daily as needed. 02/19/16 05/19/16 Yes Minna Merritts, MD  glimepiride (AMARYL) 1 MG tablet Take 1 tablet (1 mg total)  by mouth daily with breakfast. (lower dose) 12/05/15  Yes Arnetha Courser, MD  latanoprost (XALATAN) 0.005 % ophthalmic solution Place 1 drop into both eyes at bedtime.    Yes Historical Provider, MD  lisinopril (PRINIVIL,ZESTRIL) 10 MG tablet Take 1 tablet (10 mg total) by mouth daily. 02/04/16 05/04/16 Yes Wende Bushy, MD  metoprolol tartrate (LOPRESSOR) 25 MG tablet Take 1 tablet (25 mg total) by mouth 2 (two) times daily. 02/19/16  Yes Srikar Sudini, MD  ranitidine (ZANTAC) 300 MG tablet Take 1 tablet (300 mg total) by mouth at bedtime. 09/19/15  Yes Arnetha Courser, MD  rivaroxaban (XARELTO) 20 MG TABS tablet Take 1 tablet (20 mg total) by mouth daily with supper. 12/16/15  Yes Arnetha Courser, MD  timolol (TIMOPTIC) 0.5 % ophthalmic solution Place 1 drop into both eyes daily.  02/11/15  Yes Historical Provider, MD    Family History    Family History  Problem Relation Age of Onset  . Cataracts Mother   . Heart attack Father   . Heart attack Brother   . Aneurysm Daughter     Social History    Social History   Social History  . Marital status: Widowed    Spouse name: N/A  . Number of children: N/A  . Years of education: N/A   Occupational History  . Not on file.   Social History Main Topics  . Smoking status: Never Smoker  . Smokeless tobacco: Never Used  . Alcohol use No  . Drug use: No  . Sexual activity: Not on file   Other Topics Concern  . Not on file   Social History Narrative  . No narrative on file     Review of Systems    General:  Gen. malaise and low energy state. No chills, fever, night sweats or weight changes.  Cardiovascular:  No chest pain, dyspnea on exertion, edema, orthopnea, +++ palpitations, no paroxysmal nocturnal dyspnea. Dermatological: No rash, lesions/masses Respiratory: No cough, dyspnea Urologic: No hematuria, dysuria Abdominal:   No nausea, vomiting, diarrhea, bright red blood per rectum, melena, or hematemesis Neurologic:  No visual  changes, wkns, changes in mental status. All other systems reviewed and are otherwise negative except as noted above.  Physical Exam    VS:  BP 122/80 (BP Location: Left Arm, Patient Position: Sitting, Cuff Size: Normal)   Ht 5\' 6"  (1.676 m)   Wt 170 lb 8 oz (77.3 kg)   BMI 27.52 kg/m  , BMI Body mass index is 27.52 kg/m. GEN: Well nourished, well developed, in no acute distress.  HEENT: normal.  Neck: Supple, no JVD, carotid bruits, or masses. Cardiac: Irregularly irregular, distant, no murmurs, rubs, or gallops. No clubbing, cyanosis, edema.  Radials/DP/PT 2+ and equal bilaterally.  Respiratory:  Respirations regular and unlabored, clear to auscultation bilaterally. GI: Soft, nontender, nondistended, BS + x 4. MS: no deformity or atrophy. Skin: warm and dry, no rash. Neuro:  Strength and sensation are intact. Psych: Normal affect.  Accessory Clinical Findings  ECG - Atrial flutter, 82, variable conduction, LVH, nonspecific ST changes.  Assessment & Plan   1.  Paroxysmal atrial flutter with variable conduction: Patient was admitted in November secondary to chest pain and found to be in coarse atrial fibrillation versus atrial flutter with variable induction. She was placed on amiodarone and continued on beta blocker and Xarelto therapy. She remains in atrial flutter with variable conduction today. She has not had any chest pain or dyspnea but notes occasional palpitations and also generalized low energy state. She is interested in repeat cardioversion. She had previously been cardioverted in May and tolerated that well. We will check labs today and have arranged for cardioversion for the first week of January. She'll continue on current medical therapy and we will arrange for follow-up with Dr. Yvone Neu approximately 2 weeks afterwards.  2. Hypertension: Blood pressure stable today. Continue beta blocker, ACE inhibitor, and Lasix.  3. Hyperlipidemia: Continue statin therapy.  4.  History of CHF: Patient's EF was 40% in April but subsequently recovered/normalized by May 2017. We discussed how she should be careful with her salt intake over the holidays in the setting of recurrent atrial flutter and possibility for volume excess. She remains on by mouth Lasix as well as beta blocker and ACE inhibitor.  5. Disposition: Follow-up labs today. Plan cardioversion in approximately 2 weeks. She'll follow back in the office approximate 2 weeks after cardioversion.  Murray Hodgkins, NP 03/19/2016, 1:32 PM

## 2016-03-19 NOTE — Patient Instructions (Addendum)
Medication Instructions:  Your physician recommends that you continue on your current medications as directed. Please refer to the Current Medication list given to you today.   Labwork: BMET, CBC, PT/INR  Testing/Procedures: Your physician has recommended that you have a Cardioversion (DCCV). Electrical Cardioversion uses a jolt of electricity to your heart either through paddles or wired patches attached to your chest. This is a controlled, usually prescheduled, procedure. Defibrillation is done under light anesthesia in the hospital, and you usually go home the day of the procedure. This is done to get your heart back into a normal rhythm. You are not awake for the procedure. Please see the instruction sheet given to you today.  [facility] entrance Check in at the registration desk on the right.  Friday, January 5 Arrival time: 6:30am   Nothing to eat or drink after midnight the evening before your procedure.  Do not take metoprolol, lasix, or glimepiride the morning of your procedure.  You may take all other medications with a sip of water.   Follow-Up: Your physician recommends that you schedule a follow-up appointment two weeks after your procedure with Dr. Yvone Neu or Christell Faith, PA-C   Any Other Special Instructions Will Be Listed Below (If Applicable).     If you need a refill on your cardiac medications before your next appointment, please call your pharmacy.   Electrical Cardioversion Electrical cardioversion is the delivery of a jolt of electricity to restore a normal rhythm to the heart. A rhythm that is too fast or is not regular keeps the heart from pumping well. In this procedure, sticky patches or metal paddles are placed on the chest to deliver electricity to the heart from a device. This procedure may be done in an emergency if:  There is low or no blood pressure as a result of the heart rhythm.  Normal rhythm must be restored as  fast as possible to protect the brain and heart from further damage.  It may save a life. This procedure may also be done for irregular or fast heart rhythms that are not immediately life-threatening. Tell a health care provider about:  Any allergies you have.  All medicines you are taking, including vitamins, herbs, eye drops, creams, and over-the-counter medicines.  Any problems you or family members have had with anesthetic medicines.  Any blood disorders you have.  Any surgeries you have had.  Any medical conditions you have.  Whether you are pregnant or may be pregnant. What are the risks? Generally, this is a safe procedure. However, problems may occur, including:  Allergic reactions to medicines.  A blood clot that breaks free and travels to other parts of your body.  The possible return of an abnormal heart rhythm within hours or days after the procedure.  Your heart stopping (cardiac arrest). This is rare. What happens before the procedure? Medicines  Your health care provider may have you start taking:  Blood-thinning medicines (anticoagulants) so your blood does not clot as easily.  Medicines may be given to help stabilize your heart rate and rhythm.  Ask your health care provider about changing or stopping your regular medicines. This is especially important if you are taking diabetes medicines or blood thinners. General instructions  Plan to have someone take you home from the hospital or clinic.  If you will be going home right after the procedure, plan to have someone with you for 24 hours.  Follow instructions from your health care provider  about eating or drinking restrictions. What happens during the procedure?  To lower your risk of infection:  Your health care team will wash or sanitize their hands.  Your skin will be washed with soap.  An IV tube will be inserted into one of your veins.  You will be given a medicine to help you relax  (sedative).  Sticky patches (electrodes) or metal paddles may be placed on your chest.  An electrical shock will be delivered. The procedure may vary among health care providers and hospitals. What happens after the procedure?  Your blood pressure, heart rate, breathing rate, and blood oxygen level will be monitored until the medicines you were given have worn off.  Do not drive for 24 hours if you were given a sedative.  Your heart rhythm will be watched to make sure it does not change. This information is not intended to replace advice given to you by your health care provider. Make sure you discuss any questions you have with your health care provider. Document Released: 03/06/2002 Document Revised: 11/13/2015 Document Reviewed: 09/20/2015 Elsevier Interactive Patient Education  2017 [employer].  Electrical Cardioversion, Care After This sheet gives you information about how to care for yourself after your procedure. Your health care provider may also give you more specific instructions. If you have problems or questions, contact your health care provider. What can I expect after the procedure? After the procedure, it is common to have:  Some redness on the skin where the shocks were given. Follow these instructions at home:  Do not drive for 24 hours if you were given a medicine to help you relax (sedative).  Take over-the-counter and prescription medicines only as told by your health care provider.  Ask your health care provider how to check your pulse. Check it often.  Rest for 48 hours after the procedure or as told by your health care provider.  Avoid or limit your caffeine use as told by your health care provider. Contact a health care provider if:  You feel like your heart is beating too quickly or your pulse is not regular.  You have a serious muscle cramp that does not go away. Get help right away if:  You have discomfort in your chest.  You are dizzy or you  feel faint.  You have trouble breathing or you are short of breath.  Your speech is slurred.  You have trouble moving an arm or leg on one side of your body.  Your fingers or toes turn cold or blue. This information is not intended to replace advice given to you by your health care provider. Make sure you discuss any questions you have with your health care provider. Document Released: 01/04/2013 Document Revised: 10/18/2015 Document Reviewed: 09/20/2015 Elsevier Interactive Patient Education  2017 [employer].

## 2016-03-20 LAB — BASIC METABOLIC PANEL
BUN / CREAT RATIO: 16 (ref 12–28)
BUN: 13 mg/dL (ref 8–27)
CHLORIDE: 99 mmol/L (ref 96–106)
CO2: 22 mmol/L (ref 18–29)
Calcium: 9 mg/dL (ref 8.7–10.3)
Creatinine, Ser: 0.79 mg/dL (ref 0.57–1.00)
GFR calc Af Amer: 81 mL/min/{1.73_m2} (ref >59)
GFR calc non Af Amer: 70 mL/min/{1.73_m2} (ref >59)
GLUCOSE: 230 mg/dL — AB (ref 65–99)
Potassium: 3.9 mmol/L (ref 3.5–5.2)
SODIUM: 139 mmol/L (ref 134–144)

## 2016-03-20 LAB — CBC
Hematocrit: 41.5 % (ref 34.0–46.6)
Hemoglobin: 13.7 g/dL (ref 11.1–15.9)
MCH: 31.1 pg (ref 26.6–33.0)
MCHC: 33 g/dL (ref 31.5–35.7)
MCV: 94 fL (ref 79–97)
PLATELETS: 188 10*3/uL (ref 150–379)
RBC: 4.41 x10E6/uL (ref 3.77–5.28)
RDW: 13.9 % (ref 12.3–15.4)
WBC: 5.7 10*3/uL (ref 3.4–10.8)

## 2016-03-20 LAB — PROTIME-INR
INR: 1.2 (ref 0.8–1.2)
PROTHROMBIN TIME: 12.5 s — AB (ref 9.1–12.0)

## 2016-03-24 ENCOUNTER — Ambulatory Visit (INDEPENDENT_AMBULATORY_CARE_PROVIDER_SITE_OTHER): Payer: Medicare Other | Admitting: Family Medicine

## 2016-03-24 ENCOUNTER — Encounter: Payer: Self-pay | Admitting: Family Medicine

## 2016-03-24 DIAGNOSIS — E785 Hyperlipidemia, unspecified: Secondary | ICD-10-CM | POA: Diagnosis not present

## 2016-03-24 DIAGNOSIS — I484 Atypical atrial flutter: Secondary | ICD-10-CM | POA: Diagnosis not present

## 2016-03-24 DIAGNOSIS — Z13820 Encounter for screening for osteoporosis: Secondary | ICD-10-CM | POA: Diagnosis not present

## 2016-03-24 DIAGNOSIS — E1140 Type 2 diabetes mellitus with diabetic neuropathy, unspecified: Secondary | ICD-10-CM | POA: Diagnosis not present

## 2016-03-24 NOTE — Assessment & Plan Note (Signed)
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

## 2016-03-24 NOTE — Progress Notes (Signed)
BP 128/80 (BP Location: Left Arm, Patient Position: Sitting, Cuff Size: Normal)   Pulse 83   Temp 97.3 F (36.3 C) (Oral)   Resp 14   Wt 170 lb 2 oz (77.2 kg)   SpO2 94%   BMI 27.46 kg/m    Subjective:    Patient ID: Amber Wolfe, female    DOB: 11/11/1931, 82 y.o.   MRN: [id number]  HPI: Amber Wolfe is a 82 y.o. female  Chief Complaint  Patient presents with  . Follow-up    6 month    Patient is here for follow-up; Going to have cardioversion next week with Dr. Yvone Neu; no chest pain; no SHOB; she is not aware of any irregular heart beat; on blood thinners; no bleeding; with the heart problem, her nerves are pretty bad; anxiety about health at times; she lives by herself; financial concerns  Type 2 diabetes; diagnosed about 15 years ago; not checking her own FSBS Some dry mouth; no blurred vision; nocturia x 1, according to what she drinks Sept 2017 A1c was only 6.2 Urine microalbumin:creatinine was normal in September  Glaucoma; seeing eye doctor, Dr. Wallace Going; sees him in early part of 2018; no problems with vision  Hypertension; well-controlled today on medication  Heartburn; if she watches what she eats, it's not too bad; triggered by spicy foods, fried chicken; chocolate and peppermint are okay; tomato-based are minimal  She got her flu shot and pneumonia shot at [name] this year she says  Health maintenance; DEXA due  Depression screen [facility] 2/9 03/24/2016 09/19/2015 05/20/2015 05/15/2015  Decreased Interest 0 0 1 1  Down, Depressed, Hopeless 0 0 0 1  PHQ - 2 Score 0 0 1 2  Altered sleeping - - - 0  Tired, decreased energy - - - 1  Change in appetite - - - 3  Feeling bad or failure about yourself  - - - 0  Trouble concentrating - - - 0  Moving slowly or fidgety/restless - - - 1  Suicidal thoughts - - - 0  PHQ-9 Score - - - 7  Difficult doing work/chores - - Not difficult at all Not difficult at all   Relevant past medical, surgical, family  and social history reviewed Past Medical History:  Diagnosis Date  . Arthritis    In hands  . Atrial flutter ([place])    a. s/p successful TEE/DCCV 07/31/2015; b. on Xarelto; c. CHADS2VASc => 6 (CHF, HTN, age x 2, DM, female)  . Cataract   . Chronic systolic CHF (congestive heart failure) ([place])    a. echo 06/2015: EF 40%, basal HK, nl WM of mid and apical segments, mild to mod MR/TR; b. TEE 99991111: nl LV systolic function, mild to mod MR, trivial TR  . Diabetes mellitus without complication ([place])    Type II  . GERD (gastroesophageal reflux disease)   . Glaucoma    Right Eye  . Hyperlipidemia   . Hypertension   . Moderate mitral regurgitation    a. see echo from 06/2015 and TEE 07/2015  . PAF (paroxysmal atrial fibrillation) ([place])    a.  01/2016 Admitted to [facility] with PAF -->amio added.   Past Surgical History:  Procedure Laterality Date  . CATARACT EXTRACTION Bilateral   . CESAREAN SECTION    . ELECTROPHYSIOLOGIC STUDY N/A 07/31/2015   Procedure: CARDIOVERSION;  Surgeon: Wende Bushy, MD;  Location: [facility] ORS;  Service: Cardiovascular;  Laterality: N/A;  . TEE WITHOUT CARDIOVERSION N/A 07/31/2015  Procedure: TRANSESOPHAGEAL ECHOCARDIOGRAM (TEE);  Surgeon: Wende Bushy, MD;  Location: [facility] ORS;  Service: Cardiovascular;  Laterality: N/A;  . WISDOM TOOTH EXTRACTION    . Wrist Surgery Left    Family History  Problem Relation Age of Onset  . Cataracts Mother   . Heart attack Father   . Heart attack Brother   . Aneurysm Daughter    Social History  Substance Use Topics  . Smoking status: Never Smoker  . Smokeless tobacco: Never Used  . Alcohol use No   Interim medical history since last visit reviewed. Allergies and medications reviewed  Review of Systems Per HPI unless specifically indicated above     Objective:    BP 128/80 (BP Location: Left Arm, Patient Position: Sitting, Cuff Size: Normal)   Pulse 83   Temp 97.3 F (36.3 C) (Oral)   Resp 14   Wt 170 lb 2 oz (77.2 kg)    SpO2 94%   BMI 27.46 kg/m   Wt Readings from Last 3 Encounters:  03/24/16 170 lb 2 oz (77.2 kg)  03/19/16 170 lb 8 oz (77.3 kg)  02/27/16 167 lb (75.8 kg)    Physical Exam  Constitutional: She appears well-developed and well-nourished. No distress.  HENT:  Head: Normocephalic and atraumatic.  Eyes: EOM are normal. No scleral icterus.  Neck: No thyromegaly present.  Cardiovascular: Normal rate and normal heart sounds.  An irregularly irregular rhythm present.  Pulmonary/Chest: Effort normal and breath sounds normal. No respiratory distress. She has no wheezes.  Abdominal: Soft. Bowel sounds are normal. She exhibits no distension.  Musculoskeletal: Normal range of motion. She exhibits no edema.  Neurological: She is alert. She exhibits normal muscle tone.  Skin: Skin is warm and dry. She is not diaphoretic. No pallor.  Psychiatric: She has a normal mood and affect. Her behavior is normal. Judgment and thought content normal.   Diabetic Foot Form - Detailed   Diabetic Foot Exam - detailed Diabetic Foot exam was performed with the following findings:  Yes 03/24/2016  1:34 PM  Visual Foot Exam completed.:  Yes  Are the toenails ingrown?:  No Normal Range of Motion:  Yes Pulse Foot Exam completed.:  Yes  Right Dorsalis Pedis:  Present Left Dorsalis Pedis:  Present  Sensory Foot Exam Completed.:  Yes Swelling:  No Semmes-Weinstein Monofilament Test R Site 1-Great Toe:  Neg L Site 1-Great Toe:  Neg  R Site 4:  Pos L Site 4:  Pos  R Site 5:  Pos L Site 5:  Pos    Comments:  Diminished sensation plantar surface of great toes and middle toes (both feet)    Results for orders placed or performed in visit on [id number]  Basic Metabolic Panel (BMET)  Result Value Ref Range   Glucose 230 (H) 65 - 99 mg/dL   BUN 13 8 - 27 mg/dL   Creatinine, Ser 0.79 0.57 - 1.00 mg/dL   GFR calc non Af Amer 70 >59 mL/min/1.73   GFR calc Af Amer 81 >59 mL/min/1.73   BUN/Creatinine Ratio 16 12 - 28    Sodium 139 134 - 144 mmol/L   Potassium 3.9 3.5 - 5.2 mmol/L   Chloride 99 96 - 106 mmol/L   CO2 22 18 - 29 mmol/L   Calcium 9.0 8.7 - 10.3 mg/dL  CBC  Result Value Ref Range   WBC 5.7 3.4 - 10.8 x10E3/uL   RBC 4.41 3.77 - 5.28 x10E6/uL   Hemoglobin 13.7 11.1 - 15.9 g/dL  Hematocrit 41.5 34.0 - 46.6 %   MCV 94 79 - 97 fL   MCH 31.1 26.6 - 33.0 pg   MCHC 33.0 31.5 - 35.7 g/dL   RDW 13.9 12.3 - 15.4 %   Platelets 188 150 - 379 x10E3/uL  INR/PT  Result Value Ref Range   INR 1.2 0.8 - 1.2   Prothrombin Time 12.5 (H) 9.1 - 12.0 sec      Assessment & Plan:   Problem List Items Addressed This Visit      Cardiovascular and Mediastinum   Atypical atrial flutter (HCC) (Chronic)    mabaged by cardiology with plans for upcoming cardioversion; talked with patient today about signs/symptoms to stroke, reasons to get immediate medical care; continue anticoagulation        Endocrine   Diabetes mellitus with neuropathy (HCC) (Chronic)    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 Sanda Klein (March 7th or just AFTER).  An after-visit summary was printed and given to the patient at [facility].  Please see the patient instructions which may contain other information and recommendations beyond what is mentioned above in the assessment and plan.  No orders of the defined types were placed in this encounter.   Orders Placed This  Encounter  Procedures  . DG Bone Density

## 2016-03-24 NOTE — Patient Instructions (Addendum)
Return in March for next fasting labs and visit Please do see your eye doctor regularly, and have your eyes examined every year (or more often per his or her recommendation) Check your feet every night and let me know right away of any sores, infections, numbness, etc. Try to limit sweets, white bread, white rice, white potatoes It is okay with me for you to not check your fingerstick blood sugars (per [employer] [facility] [employer] Best Practices), unless you are interested and feel it would be helpful for you  12 Ways to Curb Anxiety  ?Anxiety is normal human sensation. It is what helped our ancestors survive the pitfalls of the wilderness. Anxiety is defined as experiencing worry or nervousness about an imminent event or something with an uncertain outcome. It is a feeling experienced by most people at some point in their lives. Anxiety can be triggered by a very personal issue, such as the illness of a loved one, or an event of global proportions, such as a refugee crisis. Some of the symptoms of anxiety are:  Feeling restless.  Having a feeling of impending danger.  Increased heart rate.  Rapid breathing. Sweating.  Shaking.  Weakness or feeling tired.  Difficulty concentrating on anything except the current worry.  Insomnia.  Stomach or bowel problems. What can we do about anxiety we may be feeling? There are many techniques to help manage stress and relax. Here are 12 ways you can reduce your anxiety almost immediately: 1. Turn off the constant feed of information. Take a social media sabbatical. Studies have shown that social media directly contributes to social anxiety.  2. Monitor your television viewing habits. Are you watching shows that are also contributing to your anxiety, such as 24-hour news stations? Try watching something else, or better yet, nothing at all. Instead, listen to music, read an inspirational book or practice a hobby. 3. Eat nutritious meals. Also, don't skip  meals and keep healthful snacks on hand. Hunger and poor diet contributes to feeling anxious. 4. Sleep. Sleeping on a regular schedule for at least seven to eight hours a night will do wonders for your outlook when you are awake. 5. Exercise. Regular exercise will help rid your body of that anxious energy and help you get more restful sleep. 6. Try deep (diaphragmatic) breathing. Inhale slowly through your nose for five seconds and exhale through your mouth. 7. Practice acceptance and gratitude. When anxiety hits, accept that there are things out of your control that shouldn't be of immediate concern.  8. Seek out humor. When anxiety strikes, watch a funny video, read jokes or call a friend who makes you laugh. Laughter is healing for our bodies and releases endorphins that are calming. 9. Stay positive. Take the effort to replace negative thoughts with positive ones. Try to see a stressful situation in a positive light. Try to come up with solutions rather than dwelling on the problem. 10. Figure out what triggers your anxiety. Keep a journal and make note of anxious moments and the events surrounding them. This will help you identify triggers you can avoid or even eliminate. 11. Talk to someone. Let a trusted friend, family member or even trained professional know that you are feeling overwhelmed and anxious. Verbalize what you are feeling and why.  12. Volunteer. If your anxiety is triggered by a crisis on a large scale, become an advocate and work to resolve the problem that is causing you unease. Anxiety is often unwelcome and can become  overwhelming. If not kept in check, it can become a disorder that could require medical treatment. However, if you take the time to care for yourself and avoid the triggers that make you anxious, you will be able to find moments of relaxation and clarity that make your life much more enjoyable.  Please do call to schedule your bone density study; the number to  schedule one at either [facility] or [facility] is [phone]

## 2016-03-24 NOTE — Assessment & Plan Note (Signed)
mabaged by cardiology with plans for upcoming cardioversion; talked with patient today about signs/symptoms to stroke, reasons to get immediate medical care; continue anticoagulation

## 2016-03-24 NOTE — Assessment & Plan Note (Signed)
Last lipid panel reviewed; goal LDL under 100 at least, under 70 is ideal

## 2016-03-24 NOTE — Assessment & Plan Note (Signed)
DEXA scan ordered; three servings of calcium a day; fall precautions encouraged; vitamin D 1000 iu daily recommended

## 2016-04-02 ENCOUNTER — Other Ambulatory Visit: Payer: Self-pay | Admitting: Cardiovascular Disease

## 2016-04-03 ENCOUNTER — Encounter
Admission: RE | Disposition: A | Discharge status: Home / Self Care | Payer: Self-pay | Source: Ambulatory Visit | Attending: Cardiovascular Disease

## 2016-04-03 ENCOUNTER — Ambulatory Visit: Payer: Medicare Other | Admitting: Anesthesiology

## 2016-04-03 ENCOUNTER — Encounter: Payer: Self-pay | Admitting: *Deleted

## 2016-04-03 ENCOUNTER — Ambulatory Visit
Admission: RE | Admit: 2016-04-03 | Discharge: 2016-04-03 | Disposition: A | Discharge status: Home / Self Care | Payer: Medicare Other | Source: Ambulatory Visit | Attending: Cardiovascular Disease | Admitting: Cardiovascular Disease

## 2016-04-03 DIAGNOSIS — I10 Essential (primary) hypertension: Secondary | ICD-10-CM | POA: Diagnosis not present

## 2016-04-03 DIAGNOSIS — E119 Type 2 diabetes mellitus without complications: Secondary | ICD-10-CM | POA: Diagnosis not present

## 2016-04-03 DIAGNOSIS — R0602 Shortness of breath: Secondary | ICD-10-CM

## 2016-04-03 DIAGNOSIS — I483 Typical atrial flutter: Secondary | ICD-10-CM

## 2016-04-03 DIAGNOSIS — I4891 Unspecified atrial fibrillation: Secondary | ICD-10-CM | POA: Diagnosis not present

## 2016-04-03 DIAGNOSIS — I4892 Unspecified atrial flutter: Secondary | ICD-10-CM | POA: Diagnosis not present

## 2016-04-03 DIAGNOSIS — M1990 Unspecified osteoarthritis, unspecified site: Secondary | ICD-10-CM | POA: Diagnosis not present

## 2016-04-03 DIAGNOSIS — K219 Gastro-esophageal reflux disease without esophagitis: Secondary | ICD-10-CM | POA: Insufficient documentation

## 2016-04-03 HISTORY — PX: ELECTROPHYSIOLOGIC STUDY: SHX172A

## 2016-04-03 SURGERY — CARDIOVERSION (CATH LAB)
Anesthesia: General

## 2016-04-03 MED ORDER — SODIUM CHLORIDE 0.9 % IV SOLN
INTRAVENOUS | Status: DC | PRN
  Administered 2016-04-03: 07:00:00 via INTRAVENOUS

## 2016-04-03 MED ORDER — PROPOFOL 10 MG/ML IV BOLUS
INTRAVENOUS | Status: AC
  Filled 2016-04-03: qty 20

## 2016-04-03 MED ORDER — PROPOFOL 10 MG/ML IV BOLUS
INTRAVENOUS | Status: DC | PRN
  Administered 2016-04-03: 10 mg via INTRAVENOUS
  Administered 2016-04-03: 40 mg via INTRAVENOUS

## 2016-04-03 NOTE — Anesthesia Procedure Notes (Signed)
Date/Time: 04/03/2016 7:30 AM Performed by: Johnna Acosta Pre-anesthesia Checklist: Patient identified, Emergency Drugs available, Suction available, Patient being monitored and Timeout performed Patient Re-evaluated:Patient Re-evaluated prior to inductionOxygen Delivery Method: Nasal cannula

## 2016-04-03 NOTE — Anesthesia Preprocedure Evaluation (Signed)
Anesthesia Evaluation  Patient identified by MRN, date of birth, ID band Patient awake    Reviewed: Allergy & Precautions, H&P , NPO status , Patient's Chart, lab work & pertinent test results  History of Anesthesia Complications Negative for: history of anesthetic complications  Airway Mallampati: III  TM Distance: <3 FB Neck ROM: limited    Dental  (+) Poor Dentition, Chipped, Caps   Pulmonary neg pulmonary ROS, neg shortness of breath,    Pulmonary exam normal breath sounds clear to auscultation       Cardiovascular Exercise Tolerance: Good hypertension, (-) angina+CHF  (-) Past MI and (-) DOE + dysrhythmias Atrial Fibrillation  Rhythm:irregular Rate:Normal     Neuro/Psych negative neurological ROS  negative psych ROS   GI/Hepatic Neg liver ROS, GERD  Controlled,  Endo/Other  diabetes, Type 2  Renal/GU negative Renal ROS  negative genitourinary   Musculoskeletal  (+) Arthritis ,   Abdominal   Peds  Hematology negative hematology ROS (+)   Anesthesia Other Findings Past Medical History: No date: Arthritis     Comment: In hands No date: Atrial flutter (HCC)     Comment: a. s/p successful TEE/DCCV 07/31/2015; b. on               Xarelto; c. CHADS2VASc => 6 (CHF, HTN, age x 2,              DM, female) No date: Cataract No date: Chronic systolic CHF (congestive heart failure*     Comment: a. echo 06/2015: EF 40%, basal HK, nl WM of mid              and apical segments, mild to mod MR/TR; b. TEE               99991111: nl LV systolic function, mild to mod               MR, trivial TR No date: Diabetes mellitus without complication (HCC)     Comment: Type II No date: GERD (gastroesophageal reflux disease) No date: Glaucoma     Comment: Right Eye No date: Hyperlipidemia No date: Hypertension No date: Moderate mitral regurgitation     Comment: a. see echo from 06/2015 and TEE 07/2015 No date: PAF (paroxysmal  atrial fibrillation) ([place])     Comment: a.  01/2016 Admitted to [facility] with PAF -->amio               added.  Past Surgical History: No date: CATARACT EXTRACTION Bilateral No date: CESAREAN SECTION 07/31/2015: ELECTROPHYSIOLOGIC STUDY N/A     Comment: Procedure: CARDIOVERSION;  Surgeon: Wende Bushy, MD;  Location: [facility] ORS;  Service:               Cardiovascular;  Laterality: N/A; 07/31/2015: TEE WITHOUT CARDIOVERSION N/A     Comment: Procedure: TRANSESOPHAGEAL ECHOCARDIOGRAM               (TEE);  Surgeon: Wende Bushy, MD;  Location:               [facility] ORS;  Service: Cardiovascular;                Laterality: N/A; No date: WISDOM TOOTH EXTRACTION No date: Wrist Surgery Left  BMI    Body Mass Index:  27.44 kg/m      Reproductive/Obstetrics negative OB ROS  Anesthesia Physical Anesthesia Plan  ASA: III  Anesthesia Plan: General   Post-op Pain Management:    Induction:   Airway Management Planned:   Additional Equipment:   Intra-op Plan:   Post-operative Plan:   Informed Consent: I have reviewed the patients History and Physical, chart, labs and discussed the procedure including the risks, benefits and alternatives for the proposed anesthesia with the patient or authorized representative who has indicated his/her understanding and acceptance.   Dental Advisory Given  Plan Discussed with: Anesthesiologist, CRNA and Surgeon  Anesthesia Plan Comments:         Anesthesia Quick Evaluation

## 2016-04-03 NOTE — Anesthesia Postprocedure Evaluation (Signed)
Anesthesia Post Note  Patient: Amber Wolfe  Procedure(s) Performed: Procedure(s) (LRB): CARDIOVERSION (N/A)  Patient location during evaluation: Cath Lab Anesthesia Type: General Level of consciousness: awake and alert Pain management: pain level controlled Vital Signs Assessment: post-procedure vital signs reviewed and stable Respiratory status: spontaneous breathing, nonlabored ventilation, respiratory function stable and patient connected to nasal cannula oxygen Cardiovascular status: blood pressure returned to baseline and stable Postop Assessment: no signs of nausea or vomiting Anesthetic complications: no     Last Vitals:  Vitals:   04/03/16 0808 04/03/16 0818  BP: (!) 168/66 (!) 153/70  Pulse: (!) 55 (!) 59  Resp:    Temp:      Last Pain:  Vitals:   04/03/16 0818  TempSrc: Amber Wolfe                 Amber Wolfe

## 2016-04-03 NOTE — Transfer of Care (Signed)
Immediate Anesthesia Transfer of Care Note  Patient: Amber Wolfe  Procedure(s) Performed: Procedure(s): CARDIOVERSION (N/A)  Patient Location: PACU  Anesthesia Type:General  Level of Consciousness: sedated  Airway & Oxygen Therapy: Patient Spontanous Breathing and Patient connected to nasal cannula oxygen  Post-op Assessment: Report given to RN and Post -op Vital signs reviewed and stable  Post vital signs: Reviewed and stable  Last Vitals:  Vitals:   04/03/16 0653  BP: (!) 156/91  Pulse: 81  Resp: 16  Temp: 36.7 C    Last Pain:  Vitals:   04/03/16 0653  TempSrc: Oral         Complications: No apparent anesthesia complications

## 2016-04-03 NOTE — CV Procedure (Signed)
Cardioversion procedure note For atrial flutter, typical  Procedure Details:  Consent: Risks of procedure as well as the alternatives and risks of each were explained to the (patient/caregiver). Consent for procedure obtained.  Time Out: Verified patient identification, verified procedure, site/side was marked, verified correct patient position, special equipment/implants available, medications/allergies/relevent history reviewed, required imaging and test results available. Performed  Patient placed on cardiac monitor, pulse oximetry, supplemental oxygen as necessary.  Sedation given: propofol IV, Dr. Piscitello Pacer pads placed anterior and posterior chest.   Cardioverted 1 time(s).  Cardioverted at  150J. Synchronized biphasic Converted to NSR   Evaluation: Findings: Post procedure EKG shows: NSR Complications: None Patient did tolerate procedure well.  Time Spent Directly with the Patient:  45 minutes   Tim Lavine Hargrove, M.D., Ph.D. 

## 2016-04-03 NOTE — Discharge Instructions (Signed)
Electrical Cardioversion, Care After °This sheet gives you information about how to care for yourself after your procedure. Your health care provider may also give you more specific instructions. If you have problems or questions, contact your health care provider. °What can I expect after the procedure? °After the procedure, it is common to have: °· Some redness on the skin where the shocks were given. °Follow these instructions at home: °· Do not drive for 24 hours if you were given a medicine to help you relax (sedative). °· Take over-the-counter and prescription medicines only as told by your health care provider. °· Ask your health care provider how to check your pulse. Check it often. °· Rest for 48 hours after the procedure or as told by your health care provider. °· Avoid or limit your caffeine use as told by your health care provider. °Contact a health care provider if: °· You feel like your heart is beating too quickly or your pulse is not regular. °· You have a serious muscle cramp that does not go away. °Get help right away if: °· You have discomfort in your chest. °· You are dizzy or you feel faint. °· You have trouble breathing or you are short of breath. °· Your speech is slurred. °· You have trouble moving an arm or leg on one side of your body. °· Your fingers or toes turn cold or blue. °This information is not intended to replace advice given to you by your health care provider. Make sure you discuss any questions you have with your health care provider. °Document Released: 01/04/2013 Document Revised: 10/18/2015 Document Reviewed: 09/20/2015 °Elsevier Interactive Patient Education © 2017 [employer] ° °

## 2016-04-16 ENCOUNTER — Ambulatory Visit: Payer: Medicare Other | Admitting: Cardiology

## 2016-04-21 ENCOUNTER — Encounter: Payer: Self-pay | Admitting: Cardiology

## 2016-04-21 ENCOUNTER — Ambulatory Visit (INDEPENDENT_AMBULATORY_CARE_PROVIDER_SITE_OTHER): Payer: Medicare Other | Admitting: Cardiology

## 2016-04-21 VITALS — BP 192/80 | HR 50 | Ht 66.0 in | Wt 170.4 lb

## 2016-04-21 DIAGNOSIS — E784 Other hyperlipidemia: Secondary | ICD-10-CM | POA: Diagnosis not present

## 2016-04-21 DIAGNOSIS — I480 Paroxysmal atrial fibrillation: Secondary | ICD-10-CM

## 2016-04-21 DIAGNOSIS — I10 Essential (primary) hypertension: Secondary | ICD-10-CM

## 2016-04-21 DIAGNOSIS — E7849 Other hyperlipidemia: Secondary | ICD-10-CM

## 2016-04-21 NOTE — Patient Instructions (Signed)
Medication Instructions:  Medication Samples have been provided to the patient.  Drug name: Xarelto       Strength: 20 mg        Qty: 3 bottles  LOT: [id number]  Exp.Date: 3/20  Dosing instructions: Take one tablet once daily.  Check with your pharmacy about Eliquis 5 mg twice a day    Testing/Procedures: Your physician has requested that you have an echocardiogram. Echocardiography is a painless test that uses sound waves to create images of your heart. It provides your doctor with information about the size and shape of your heart and how well your heart's chambers and valves are working. This procedure takes approximately one hour. There are no restrictions for this procedure.    Follow-Up: Your physician wants you to follow-up in: 3 months with Dr. Yvone Neu. You will receive a reminder letter in the mail two months in advance. If you don't receive a letter, please call our office to schedule the follow-up appointment.  It was a pleasure seeing you today here in the office. Please do not hesitate to give [place] a call back if you have any further questions. Garza[place] II, BSN

## 2016-04-21 NOTE — Progress Notes (Signed)
Cardiology Office Note   Date:  04/21/2016   ID:  Amber Wolfe, DOB 05-17-1931, MRN [id number]  Referring Doctor:  Enid Derry, MD   Cardiologist:   Wende Bushy, MD   Reason for consultation:  Chief Complaint  Patient presents with  . Follow-up    no complaints.   Follow-up for A. fib   History of Present Illness: Amber Wolfe is a 83 y.o. female who presents for follow-up afib  Since last visit, patient underwent cardioversion 04/03/2016 with Dr. Rockey Situ. 150 J. Patient tolerated procedure well. Patient has been doing well since then.  She has no complaints of chest pain, shortness of breath, PND, orthopnea, edema, loss of consciousness.  ROS:  Please see the history of present illness. Aside from mentioned under HPI, all other systems are reviewed and negative.     Past Medical History:  Diagnosis Date  . Arthritis    In hands  . Atrial flutter ([place])    a. s/p successful TEE/DCCV 07/31/2015; b. on Xarelto; c. CHADS2VASc => 6 (CHF, HTN, age x 2, DM, female)  . Cataract   . Chronic systolic CHF (congestive heart failure) ([place])    a. echo 06/2015: EF 40%, basal HK, nl WM of mid and apical segments, mild to mod MR/TR; b. TEE 99991111: nl LV systolic function, mild to mod MR, trivial TR  . Diabetes mellitus without complication ([place])    Type II  . GERD (gastroesophageal reflux disease)   . Glaucoma    Right Eye  . Hyperlipidemia   . Hypertension   . Moderate mitral regurgitation    a. see echo from 06/2015 and TEE 07/2015  . PAF (paroxysmal atrial fibrillation) ([place])    a.  01/2016 Admitted to [facility] with PAF -->amio added.    Past Surgical History:  Procedure Laterality Date  . CATARACT EXTRACTION Bilateral   . CESAREAN SECTION    . ELECTROPHYSIOLOGIC STUDY N/A 07/31/2015   Procedure: CARDIOVERSION;  Surgeon: Wende Bushy, MD;  Location: [facility] ORS;  Service: Cardiovascular;  Laterality: N/A;  . ELECTROPHYSIOLOGIC STUDY N/A 04/03/2016   Procedure: CARDIOVERSION;   Surgeon: Minna Merritts, MD;  Location: [facility] ORS;  Service: Cardiovascular;  Laterality: N/A;  . TEE WITHOUT CARDIOVERSION N/A 07/31/2015   Procedure: TRANSESOPHAGEAL ECHOCARDIOGRAM (TEE);  Surgeon: Wende Bushy, MD;  Location: [facility] ORS;  Service: Cardiovascular;  Laterality: N/A;  . WISDOM TOOTH EXTRACTION    . Wrist Surgery Left      reports that she has never smoked. She has never used smokeless tobacco. She reports that she does not drink alcohol or use drugs.   family history includes Aneurysm in her daughter; Cataracts in her mother; Heart attack in her brother and father.   Current Outpatient Prescriptions  Medication Sig Dispense Refill  . acetaminophen (TYLENOL) 500 MG tablet Take 500-1,000 mg by mouth daily as needed for moderate pain or headache.    Marland Kitchen amiodarone (PACERONE) 200 MG tablet Take 1 tablet (200 mg total) by mouth 2 (two) times daily. 200 mg bid x 5 days, then 200 mg daily thereafter (Patient taking differently: Take 200 mg by mouth daily. ) 60 tablet 0  . atorvastatin (LIPITOR) 20 MG tablet Take 1 tablet (20 mg total) by mouth at bedtime. 30 tablet 6  . furosemide (LASIX) 20 MG tablet Take 1 tablet (20 mg total) by mouth daily as needed. (Patient taking differently: Take 20 mg by mouth daily. ) 30 tablet 3  . glimepiride (AMARYL) 1  MG tablet Take 1 tablet (1 mg total) by mouth daily with breakfast. (lower dose) 30 tablet 5  . latanoprost (XALATAN) 0.005 % ophthalmic solution Place 1 drop into both eyes at bedtime.     Marland Kitchen lisinopril (PRINIVIL,ZESTRIL) 10 MG tablet Take 1 tablet (10 mg total) by mouth daily. 30 tablet 6  . metoprolol tartrate (LOPRESSOR) 25 MG tablet Take 1 tablet (25 mg total) by mouth 2 (two) times daily. 60 tablet 0  . rivaroxaban (XARELTO) 20 MG TABS tablet Take 1 tablet (20 mg total) by mouth daily with supper. 30 tablet 5  . timolol (TIMOPTIC) 0.5 % ophthalmic solution Place 1 drop into both eyes at bedtime.      No current facility-administered  medications for this visit.     Allergies: Patient has no known allergies.    PHYSICAL EXAM: VS:  BP (!) 192/80 (BP Location: Left Arm, Patient Position: Sitting, Cuff Size: Normal)   Pulse (!) 50   Ht 5\' 6"  (1.676 m)   Wt 170 lb 6.4 oz (77.3 kg)   BMI 27.50 kg/m  , Body mass index is 27.5 kg/m. Wt Readings from Last 3 Encounters:  04/21/16 170 lb 6.4 oz (77.3 kg)  04/03/16 170 lb (77.1 kg)  03/24/16 170 lb 2 oz (77.2 kg)    GENERAL:  well developed, well nourished, not in acute distress HEENT: normocephalic, pink conjunctivae, anicteric sclerae, no xanthelasma, normal dentition, oropharynx clear NECK:  no neck vein engorgement, JVP normal, no hepatojugular reflux, carotid upstroke brisk and symmetric, no bruit, no thyromegaly, no lymphadenopathy LUNGS:  good respiratory effort, clear to auscultation bilaterally CV:  PMI not displaced, no thrills, no lifts, S1 and S2 within normal limits, no palpable S3 or S4, no murmurs, no rubs, no gallops ABD:  Soft, nontender, nondistended, normoactive bowel sounds, no abdominal aortic bruit, no hepatomegaly, no splenomegaly MS: nontender back, no kyphosis, no scoliosis, no joint deformities EXT:  2+ DP/PT pulses, no edema, no varicosities, no cyanosis, no clubbing SKIN: warm, nondiaphoretic, normal turgor, no ulcers NEUROPSYCH: alert, oriented to person, place, and time, sensory/motor grossly intact, normal mood, appropriate affect   Recent Labs: 02/17/2016: ALT 12; TSH 2.747 02/18/2016: Hemoglobin 13.8 03/19/2016: BUN 13; Creatinine, Ser 0.79; Platelets 188; Potassium 3.9; Sodium 139   Lipid Panel    Component Value Date/Time   CHOL 138 02/17/2016 1553   CHOL 159 05/15/2015 1619   TRIG 135 02/17/2016 1553   TRIG 124 05/15/2015 1619   HDL 39 (L) 02/17/2016 1553   CHOLHDL 3.5 02/17/2016 1553   VLDL 27 02/17/2016 1553   VLDL 25 05/15/2015 1619   LDLCALC 72 02/17/2016 1553     Other studies Reviewed:  EKG:   EKG from 02/27/2016  was personally reviewed by me and it revealed atrial fibrillation, ventricular rate 70 BPM.  EKG from 08/06/2015 was personally reviewed by me and it showed sinus rhythm, 65 BPM.  EKG from 07/22/2015 was personally reviewed by me and it showed atrial flutter with variable conduction, ventricular rate of 111 bpm. Nonspecific ST-T wave changes.  The ekg from 06/18/2015  was personally reviewed by me and it reveals atrial flutter, with variable AV block, ventricular rate of 79 BPM.  EKG from 06/11/2015 showed atrial fibrillation, 124 ventricular rate. Nonspecific ST-T wave changes.  EKG from 04/20/2014 showed sinus rhythm.  Additional studies/ records that were reviewed personally reviewed by me today include:    Echocardiogram 07/10/2015: Left ventricle: The cavity size was normal. Wall thickness was  normal. The estimated ejection fraction was approximately 40%.  There is hypokinesis in the basal segments, normal wall motion in  the mid and apical segments. - Mitral valve: Calcified annulus. There was mild to moderate  regurgitation. - Left atrium: The atrium was moderately dilated. - Right atrium: The atrium was moderately dilated. - Tricuspid valve: There was mild-moderate regurgitation.  Holter 07/10/2015: Overall rhythm appears to be atrial flutter. Average heart rate of 78 BPM.  Minimum heart rate of 47 bpm at 4:53 AM 07/11/2015. 3% of total number of beats were in bradycardia. Maximum heart rate of 189 bpm at 10:19 AM of 07/10/2015. 11% of the total number of beats were in tachycardia.  Pharmacologic nuclear stress test 06/21/2015: Pharmacological myocardial perfusion imaging study with no significant ischemia Normal wall motion, EF estimated at 60% No EKG changes concerning for ischemia at peak stress or in recovery. Baseline EKG with atrial fibrillation. Rare PVC noted. Low risk scan  TEE 07/31/2015:  Study Conclusions  - Left atrium: No evidence of thrombus in  the atrial cavity or  appendage. Cardioversion with 200 J  ASSESSMENT AND PLAN:  Atrial flutter, as noted on Holter monitor. Average of 78 BPM. Maximum heart rate of 189 BPM, 11% of total number of beats in tachycardia. Atrial fibrillation  Status post cardioversion 07/31/2015, 200 J Status post cardioversion 150 J 04/03/2016 on amiodarone  Per last visit 02/27/2016,Continue oral anticoagulation with Xarelto 20mg  po qd. patient to check with insurance whether there is another medication that is more cost effective. Continue metoprolol Recommend to repeat echocardiogram with contrast. if LVEF is normal, may consider switching to Dronaderone.  Hypertension Unusually high today otherwise BP is well controlled. General blood pressure log. Patient called our office if more than 140/80. At that time, we'll increase lisinopril 20 daily and recheck BMP in 1 week.  Hyperlipidemia Continue statin therapy. PCP following labs.  Current medicines are reviewed at length with the patient today.  The patient does not have concerns regarding medicines.  Labs/ tests ordered today include:  Orders Placed This Encounter  Procedures  . EKG 12-Lead  . ECHOCARDIOGRAM COMPLETE    I had a lengthy and detailed discussion with the patient regarding diagnoses, prognosis, diagnostic options, treatment options, and side effects of medications.   I counseled the patient on importance of lifestyle modification including heart healthy diet, regular physical activity.   Disposition:   FU With undersigned in 3 months  Signed, Wende Bushy, MD  04/21/2016 4:23 PM    [place]

## 2016-04-22 ENCOUNTER — Telehealth: Payer: Self-pay | Admitting: Cardiology

## 2016-04-22 ENCOUNTER — Encounter: Payer: Self-pay | Admitting: Cardiology

## 2016-04-22 MED ORDER — AMIODARONE HCL 200 MG PO TABS: 200.0000 mg | ORAL_TABLET | Freq: Every day | ORAL | 0 refills | Status: DC

## 2016-04-22 NOTE — Telephone Encounter (Signed)
*  STAT* If patient is at the pharmacy, call can be transferred to refill team.   1. Which medications need to be refilled? (please list name of each medication and dose if known) amiodarone (PACERONE) 200 MG tablet  2. Which pharmacy/location (including street and city if local pharmacy) is medication to be sent to? [place]  3. Do they need a 30 day or 90 day supply? 30 day

## 2016-04-22 NOTE — Telephone Encounter (Signed)
This encounter was created in error - please disregard.

## 2016-04-22 NOTE — Telephone Encounter (Signed)
Patient states that she needs a refill of her amiodarone and let her know that I would check with Dr. Yvone Neu and then be in touch with her. She was appreciative for the call and had no further questions. Spoke with Dr. Yvone Neu and she wants patient to take only once daily with no refills and that we will discuss at her next follow up.

## 2016-04-22 NOTE — Telephone Encounter (Signed)
Spoke with patient and reviewed with her that I sent in Amiodarone 200 mg Once daily with no refills and that Dr. Yvone Neu will review at her next appointment if changes need to be made. She verbalized understanding and had no further questions at this time and was appreciative for the call back. Let her know that we would see her at her follow up appt in February.

## 2016-04-25 ENCOUNTER — Emergency Department
Admission: EM | Admit: 2016-04-25 | Discharge: 2016-04-25 | Disposition: A | Discharge status: Home / Self Care | Payer: Medicare Other | Attending: Emergency Medicine | Admitting: Emergency Medicine

## 2016-04-25 ENCOUNTER — Emergency Department: Payer: Medicare Other

## 2016-04-25 ENCOUNTER — Encounter: Payer: Self-pay | Admitting: Emergency Medicine

## 2016-04-25 DIAGNOSIS — R001 Bradycardia, unspecified: Secondary | ICD-10-CM | POA: Diagnosis not present

## 2016-04-25 DIAGNOSIS — Y929 Unspecified place or not applicable: Secondary | ICD-10-CM | POA: Diagnosis not present

## 2016-04-25 DIAGNOSIS — M25512 Pain in left shoulder: Secondary | ICD-10-CM | POA: Diagnosis not present

## 2016-04-25 DIAGNOSIS — W19XXXA Unspecified fall, initial encounter: Secondary | ICD-10-CM | POA: Diagnosis not present

## 2016-04-25 DIAGNOSIS — W010XXA Fall on same level from slipping, tripping and stumbling without subsequent striking against object, initial encounter: Secondary | ICD-10-CM | POA: Insufficient documentation

## 2016-04-25 DIAGNOSIS — I110 Hypertensive heart disease with heart failure: Secondary | ICD-10-CM | POA: Insufficient documentation

## 2016-04-25 DIAGNOSIS — Y999 Unspecified external cause status: Secondary | ICD-10-CM | POA: Diagnosis not present

## 2016-04-25 DIAGNOSIS — S4992XA Unspecified injury of left shoulder and upper arm, initial encounter: Secondary | ICD-10-CM | POA: Diagnosis present

## 2016-04-25 DIAGNOSIS — Y9389 Activity, other specified: Secondary | ICD-10-CM | POA: Insufficient documentation

## 2016-04-25 DIAGNOSIS — E119 Type 2 diabetes mellitus without complications: Secondary | ICD-10-CM | POA: Insufficient documentation

## 2016-04-25 DIAGNOSIS — S42202A Unspecified fracture of upper end of left humerus, initial encounter for closed fracture: Secondary | ICD-10-CM | POA: Diagnosis not present

## 2016-04-25 DIAGNOSIS — I5022 Chronic systolic (congestive) heart failure: Secondary | ICD-10-CM | POA: Insufficient documentation

## 2016-04-25 DIAGNOSIS — S42212A Unspecified displaced fracture of surgical neck of left humerus, initial encounter for closed fracture: Secondary | ICD-10-CM | POA: Diagnosis not present

## 2016-04-25 MED ORDER — HYDROCODONE-ACETAMINOPHEN 5-325 MG PO TABS: 1.0000 | ORAL_TABLET | Freq: Four times a day (QID) | ORAL | 0 refills | Status: DC | PRN

## 2016-04-25 MED ORDER — HYDROCODONE-ACETAMINOPHEN 5-325 MG PO TABS
2.0000 | ORAL_TABLET | Freq: Once | ORAL | Status: AC
Start: 2016-04-25 — End: 2016-04-25
  Administered 2016-04-25: 2 via ORAL
  Filled 2016-04-25: qty 2

## 2016-04-25 NOTE — ED Provider Notes (Signed)
[facility] Emergency Department Provider Note        Time seen: ----------------------------------------- 3:30 PM on 04/25/2016 -----------------------------------------    I have reviewed the triage vital signs and the nursing notes.   HISTORY  Chief Complaint Shoulder Injury    HPI Amber Wolfe is a 83 y.o. female who presents to ER after she tripped and fell. Patient complains of severe pain to left shoulder, nothing makes it better, movement of the shoulder makes it worse. She is currently on Xarelto for atrial flutter and has had extensive recent cardiac workup. She denies any other injuries or complaints at this time.   Past Medical History:  Diagnosis Date  . Arthritis    In hands  . Atrial flutter ([place])    a. s/p successful TEE/DCCV 07/31/2015; b. on Xarelto; c. CHADS2VASc => 6 (CHF, HTN, age x 2, DM, female)  . Cataract   . Chronic systolic CHF (congestive heart failure) ([place])    a. echo 06/2015: EF 40%, basal HK, nl WM of mid and apical segments, mild to mod MR/TR; b. TEE 99991111: nl LV systolic function, mild to mod MR, trivial TR  . Diabetes mellitus without complication ([place])    Type II  . GERD (gastroesophageal reflux disease)   . Glaucoma    Right Eye  . Hyperlipidemia   . Hypertension   . Moderate mitral regurgitation    a. see echo from 06/2015 and TEE 07/2015  . PAF (paroxysmal atrial fibrillation) ([place])    a.  01/2016 Admitted to [facility] with PAF -->amio added.    Patient Active Problem List   Diagnosis Date Noted  . Typical atrial flutter ([place])   . SOB (shortness of breath)   . Osteoporosis screening 03/24/2016  . Palpitations 02/17/2016  . Mitral regurgitation 02/17/2016  . Left calcaneal bursitis 09/19/2015  . Atypical atrial flutter ([place]) 06/18/2015  . Hyperlipidemia 06/18/2015  . Postmenopausal 05/20/2015  . Primary osteoarthritis of right knee 05/20/2015  . Breast cancer screening 05/20/2015  . Diabetes mellitus  with neuropathy ([place]) 05/15/2015  . Essential hypertension, benign 05/15/2015  . Medication monitoring encounter 05/15/2015  . Osteoarthritis of both hands 05/15/2015  . Decreased hearing of both ears 05/15/2015  . Atrial fibrillation ([place]) 05/15/2015    Past Surgical History:  Procedure Laterality Date  . CATARACT EXTRACTION Bilateral   . CESAREAN SECTION    . ELECTROPHYSIOLOGIC STUDY N/A 07/31/2015   Procedure: CARDIOVERSION;  Surgeon: Wende Bushy, MD;  Location: [facility] ORS;  Service: Cardiovascular;  Laterality: N/A;  . ELECTROPHYSIOLOGIC STUDY N/A 04/03/2016   Procedure: CARDIOVERSION;  Surgeon: Minna Merritts, MD;  Location: [facility] ORS;  Service: Cardiovascular;  Laterality: N/A;  . TEE WITHOUT CARDIOVERSION N/A 07/31/2015   Procedure: TRANSESOPHAGEAL ECHOCARDIOGRAM (TEE);  Surgeon: Wende Bushy, MD;  Location: [facility] ORS;  Service: Cardiovascular;  Laterality: N/A;  . WISDOM TOOTH EXTRACTION    . Wrist Surgery Left     Allergies Patient has no known allergies.  Social History Social History  Substance Use Topics  . Smoking status: Never Smoker  . Smokeless tobacco: Never Used  . Alcohol use No    Review of Systems Constitutional: Negative for fever. Cardiovascular: Negative for chest pain. Respiratory: Negative for shortness of breath. Musculoskeletal: Positive for left shoulder pain Skin: Negative for rash. Neurological: Negative for headaches, focal weakness or numbness.  10-point ROS otherwise negative.  ____________________________________________   PHYSICAL EXAM:  VITAL SIGNS: ED Triage Vitals [04/25/16 1434]  Enc Vitals Group  BP      Pulse      Resp      Temp      Temp src      SpO2      Weight      Height      Head Circumference      Peak Flow      Pain Score 8     Pain Loc      Pain Edu?      Excl. in [place]?     Constitutional: Alert and oriented. Well appearing and in no distress. Eyes: Conjunctivae are normal.  Normal extraocular  movements. Cardiovascular: Slow rate, irregular rhythm No murmurs, rubs, or gallops. Respiratory: Normal respiratory effort without tachypnea nor retractions. Breath sounds are clear and equal bilaterally. No wheezes/rales/rhonchi. Musculoskeletal: Left shoulder swelling and tenderness, pain with range of motion of left shoulder. Neurologic:  Normal speech and language. No gross focal neurologic deficits are appreciated.  Skin:  Skin is warm, dry and intact. No rash noted. Psychiatric: Mood and affect are normal. Speech and behavior are normal.  ____________________________________________  EKG: Interpreted by me. Sinus bradycardia with a rate of 49 bpm, prolonged PR interval, normal QRS, normal QT, normal axis.  ____________________________________________  ED COURSE:  Pertinent labs & imaging results that were available during my care of the patient were reviewed by me and considered in my medical decision making (see chart for details). Patient presents to the ER after a mechanical fall. We will obtain basic x-rays, provide oral pain medicine.   Procedures ____________________________________________   RADIOLOGY Images were viewed by me  Left shoulder x-rays reveal IMPRESSION: Mildly comminuted humeral neck fracture extending into the head with apex anterior angulation and mild shortening.  ____________________________________________  FINAL ASSESSMENT AND PLAN  Left humeral neck fracture, bradycardia  Plan: Patient with imaging as dictated above. Patient was placed in a shoulder immobilizer, she will be referred to orthopedics for outpatient follow-up. She is currently under the care of a cardiologist and appears asymptomatic from her bradycardia at this time.   Earleen Newport, MD   Note: This note was generated in part or whole with voice recognition software. Voice recognition is usually quite accurate but there are transcription errors that can and very often  do occur. I apologize for any typographical errors that were not detected and corrected.     Earleen Newport, MD 04/25/16 [phone]

## 2016-04-25 NOTE — ED Notes (Signed)
Patient states that she was getting in her car today, patient miss stepped and fell, landing on left shoulder. Patient denies LOC or hitting head. Patient currently c/o left arm pain palpable pulse present, patient able to move fingers.

## 2016-04-25 NOTE — ED Triage Notes (Signed)
Tripped and fell.  Arrived via EMS in wheelchair. Pain to left shoulder.

## 2016-04-29 ENCOUNTER — Telehealth: Payer: Self-pay | Admitting: Cardiology

## 2016-04-29 DIAGNOSIS — S42232A 3-part fracture of surgical neck of left humerus, initial encounter for closed fracture: Secondary | ICD-10-CM | POA: Diagnosis not present

## 2016-04-29 NOTE — Telephone Encounter (Signed)
Patient broke her left shoulder and wants to reschedule her echo that was scheduled at [facility]. Explained the procedure to her and she still wants to wait on this. Will cancel the echo and she will call back when able to reschedule.

## 2016-04-29 NOTE — Telephone Encounter (Signed)
Pt calling stating she fell and broke her shoulder would like to reschedule her test at the hospital Please call back

## 2016-05-01 ENCOUNTER — Other Ambulatory Visit: Payer: Self-pay | Admitting: Physician Assistant

## 2016-05-01 DIAGNOSIS — S42232A 3-part fracture of surgical neck of left humerus, initial encounter for closed fracture: Secondary | ICD-10-CM

## 2016-05-05 ENCOUNTER — Ambulatory Visit
Admission: RE | Admit: 2016-05-05 | Discharge: 2016-05-05 | Disposition: A | Discharge status: Home / Self Care | Payer: Medicare Other | Source: Ambulatory Visit | Attending: Physician Assistant | Admitting: Physician Assistant

## 2016-05-05 DIAGNOSIS — S42232A 3-part fracture of surgical neck of left humerus, initial encounter for closed fracture: Secondary | ICD-10-CM | POA: Insufficient documentation

## 2016-05-05 DIAGNOSIS — X58XXXA Exposure to other specified factors, initial encounter: Secondary | ICD-10-CM | POA: Diagnosis not present

## 2016-05-05 DIAGNOSIS — S42232D 3-part fracture of surgical neck of left humerus, subsequent encounter for fracture with routine healing: Secondary | ICD-10-CM | POA: Diagnosis present

## 2016-05-05 DIAGNOSIS — R937 Abnormal findings on diagnostic imaging of other parts of musculoskeletal system: Secondary | ICD-10-CM | POA: Diagnosis not present

## 2016-05-05 DIAGNOSIS — S42302A Unspecified fracture of shaft of humerus, left arm, initial encounter for closed fracture: Secondary | ICD-10-CM | POA: Diagnosis not present

## 2016-05-06 ENCOUNTER — Ambulatory Visit: Payer: Medicare Other

## 2016-05-25 ENCOUNTER — Other Ambulatory Visit: Payer: Self-pay

## 2016-05-25 MED ORDER — AMIODARONE HCL 200 MG PO TABS: 200.0000 mg | ORAL_TABLET | Freq: Every day | ORAL | 0 refills | Status: DC

## 2016-05-25 NOTE — Telephone Encounter (Signed)
Requested Prescriptions   Signed Prescriptions Disp Refills  . amiodarone (PACERONE) 200 MG tablet 30 tablet 0    Sig: Take 1 tablet (200 mg total) by mouth daily.    Authorizing Provider: Wende Bushy    Ordering User: Janan Ridge

## 2016-05-26 DIAGNOSIS — M25512 Pain in left shoulder: Secondary | ICD-10-CM | POA: Diagnosis not present

## 2016-05-26 DIAGNOSIS — S42232D 3-part fracture of surgical neck of left humerus, subsequent encounter for fracture with routine healing: Secondary | ICD-10-CM | POA: Diagnosis not present

## 2016-06-08 ENCOUNTER — Ambulatory Visit: Payer: Medicare Other | Admitting: Family Medicine

## 2016-06-10 ENCOUNTER — Other Ambulatory Visit: Payer: Self-pay | Admitting: Family Medicine

## 2016-06-10 NOTE — Telephone Encounter (Signed)
Labs from 01/2016 reviewed; Rx approved

## 2016-06-15 ENCOUNTER — Encounter: Payer: Self-pay | Admitting: Family Medicine

## 2016-06-15 ENCOUNTER — Ambulatory Visit (INDEPENDENT_AMBULATORY_CARE_PROVIDER_SITE_OTHER): Payer: Medicare Other | Admitting: Family Medicine

## 2016-06-15 DIAGNOSIS — E7849 Other hyperlipidemia: Secondary | ICD-10-CM

## 2016-06-15 DIAGNOSIS — E1140 Type 2 diabetes mellitus with diabetic neuropathy, unspecified: Secondary | ICD-10-CM | POA: Diagnosis not present

## 2016-06-15 DIAGNOSIS — E784 Other hyperlipidemia: Secondary | ICD-10-CM | POA: Diagnosis not present

## 2016-06-15 DIAGNOSIS — I484 Atypical atrial flutter: Secondary | ICD-10-CM

## 2016-06-15 DIAGNOSIS — Z5181 Encounter for therapeutic drug level monitoring: Secondary | ICD-10-CM | POA: Diagnosis not present

## 2016-06-15 DIAGNOSIS — I480 Paroxysmal atrial fibrillation: Secondary | ICD-10-CM

## 2016-06-15 LAB — LIPID PANEL
Cholesterol: 124 mg/dL (ref <200)
HDL: 55 mg/dL (ref >50)
LDL CALC: 54 mg/dL (ref <100)
Total CHOL/HDL Ratio: 2.3 Ratio (ref <5.0)
Triglycerides: 76 mg/dL (ref <150)
VLDL: 15 mg/dL (ref <30)

## 2016-06-15 LAB — COMPLETE METABOLIC PANEL WITH GFR
ALT: 10 U/L (ref 6–29)
AST: 13 U/L (ref 10–35)
Albumin: 3.8 g/dL (ref 3.6–5.1)
Alkaline Phosphatase: 82 U/L (ref 33–130)
BUN: 13 mg/dL (ref 7–25)
CALCIUM: 9.1 mg/dL (ref 8.6–10.4)
CHLORIDE: 107 mmol/L (ref 98–110)
CO2: 26 mmol/L (ref 20–31)
CREATININE: 0.6 mg/dL (ref 0.60–0.88)
GFR, Est African American: >89 mL/min (ref >=60)
GFR, Est Non African American: 85 mL/min (ref >=60)
Glucose, Bld: 137 mg/dL — ABNORMAL HIGH (ref 65–99)
Potassium: 3.9 mmol/L (ref 3.5–5.3)
Sodium: 142 mmol/L (ref 135–146)
Total Bilirubin: 0.6 mg/dL (ref 0.2–1.2)
Total Protein: 6.1 g/dL (ref 6.1–8.1)

## 2016-06-15 LAB — CBC WITH DIFFERENTIAL/PLATELET
Basophils Absolute: 0 cells/uL (ref 0–200)
Basophils Relative: 0 %
EOS PCT: 2 %
Eosinophils Absolute: 118 cells/uL (ref 15–500)
HCT: 38.2 % (ref 35.0–45.0)
HEMOGLOBIN: 12.3 g/dL (ref 11.7–15.5)
LYMPHS ABS: 1239 {cells}/uL (ref 850–3900)
Lymphocytes Relative: 21 %
MCH: 31 pg (ref 27.0–33.0)
MCHC: 32.2 g/dL (ref 32.0–36.0)
MCV: 96.2 fL (ref 80.0–100.0)
MONO ABS: 472 {cells}/uL (ref 200–950)
MPV: 12.2 fL (ref 7.5–12.5)
Monocytes Relative: 8 %
Neutro Abs: 4071 cells/uL (ref 1500–7800)
Neutrophils Relative %: 69 %
PLATELETS: 185 10*3/uL (ref 140–400)
RBC: 3.97 MIL/uL (ref 3.80–5.10)
RDW: 14.5 % (ref 11.0–15.0)
WBC: 5.9 10*3/uL (ref 3.8–10.8)

## 2016-06-15 NOTE — Progress Notes (Signed)
BP (!) 144/66   Pulse (!) 52   Temp 98 F (36.7 C) (Oral)   Resp 14   Wt 174 lb 12.8 oz (79.3 kg)   SpO2 96%   BMI 28.21 kg/m    Subjective:    Patient ID: Amber Wolfe, female    DOB: May 25, 1931, 83 y.o.   MRN: [id number]  HPI: Amber Wolfe is a 83 y.o. female  Chief Complaint  Patient presents with  . Follow-up   She broke her left shoulder mechanical; nothing like passing out; going to see Dr. Roland Rack; she is supposed to be wearing a sling just at night; six weeks ago  Type 2 diabetes; numbness of feet; no pain Last A1c 6.2 in September Has eye exam scheduled in April 2018; sees him every 3 months  She sees her heart doctor soon and they'll do an echo; asymptomatic CHF; she knows to stay away from salt Atrial fib; no bleeding on xarelto  High cholesterol; tries to avoid fatty meats  Depression screen [facility] 2/9 06/15/2016 03/24/2016 09/19/2015 05/20/2015 05/15/2015  Decreased Interest 0 0 0 1 1  Down, Depressed, Hopeless 0 0 0 0 1  PHQ - 2 Score 0 0 0 1 2  Altered sleeping - - - - 0  Tired, decreased energy - - - - 1  Change in appetite - - - - 3  Feeling bad or failure about yourself  - - - - 0  Trouble concentrating - - - - 0  Moving slowly or fidgety/restless - - - - 1  Suicidal thoughts - - - - 0  PHQ-9 Score - - - - 7  Difficult doing work/chores - - - Not difficult at all Not difficult at all   Relevant past medical, surgical, family and social history reviewed Past Medical History:  Diagnosis Date  . Arthritis    In hands  . Atrial flutter ([place])    a. s/p successful TEE/DCCV 07/31/2015; b. on Xarelto; c. CHADS2VASc => 6 (CHF, HTN, age x 2, DM, female)  . Cataract   . Chronic systolic CHF (congestive heart failure) ([place])    a. echo 06/2015: EF 40%, basal HK, nl WM of mid and apical segments, mild to mod MR/TR; b. TEE 4/0/1027: nl LV systolic function, mild to mod MR, trivial TR  . Diabetes mellitus without complication ([place])    Type II  . GERD  (gastroesophageal reflux disease)   . Glaucoma    Right Eye  . Hyperlipidemia   . Hypertension   . Moderate mitral regurgitation    a. see echo from 06/2015 and TEE 07/2015  . PAF (paroxysmal atrial fibrillation) ([place])    a.  01/2016 Admitted to [facility] with PAF -->amio added.   Past Surgical History:  Procedure Laterality Date  . CATARACT EXTRACTION Bilateral   . CESAREAN SECTION    . ELECTROPHYSIOLOGIC STUDY N/A 07/31/2015   Procedure: CARDIOVERSION;  Surgeon: Wende Bushy, MD;  Location: [facility] ORS;  Service: Cardiovascular;  Laterality: N/A;  . ELECTROPHYSIOLOGIC STUDY N/A 04/03/2016   Procedure: CARDIOVERSION;  Surgeon: Minna Merritts, MD;  Location: [facility] ORS;  Service: Cardiovascular;  Laterality: N/A;  . TEE WITHOUT CARDIOVERSION N/A 07/31/2015   Procedure: TRANSESOPHAGEAL ECHOCARDIOGRAM (TEE);  Surgeon: Wende Bushy, MD;  Location: [facility] ORS;  Service: Cardiovascular;  Laterality: N/A;  . WISDOM TOOTH EXTRACTION    . Wrist Surgery Left    Family History  Problem Relation Age of Onset  . Cataracts Mother   .  Heart attack Mother   . Heart attack Father   . Heart attack Brother   . Aneurysm Daughter   . Heart attack Maternal Grandfather   . Heart attack Paternal Grandfather    Social History  Substance Use Topics  . Smoking status: Never Smoker  . Smokeless tobacco: Never Used  . Alcohol use No   Interim medical history since last visit reviewed. Allergies and medications reviewed  Review of Systems  Constitutional: Positive for unexpected weight change (limited activity from shoulder fracture).  Respiratory: Negative for cough.   Cardiovascular: Positive for leg swelling. Negative for chest pain.  Musculoskeletal:       Left shoulder fracture 6 weeks ago   Per HPI unless specifically indicated above     Objective:    BP (!) 144/66   Pulse (!) 52   Temp 98 F (36.7 C) (Oral)   Resp 14   Wt 174 lb 12.8 oz (79.3 kg)   SpO2 96%   BMI 28.21 kg/m   Wt Readings from  Last 3 Encounters:  06/15/16 174 lb 12.8 oz (79.3 kg)  04/21/16 170 lb 6.4 oz (77.3 kg)  04/03/16 170 lb (77.1 kg)    Physical Exam  Constitutional: She appears well-developed and well-nourished. No distress.  HENT:  Head: Normocephalic and atraumatic.  Eyes: EOM are normal. No scleral icterus.  Neck: No thyromegaly present.  Cardiovascular: Regular rhythm and normal heart sounds.   No extrasystoles are present. Bradycardia present.   No murmur heard. Sounds to be in sinus brady today  Pulmonary/Chest: Effort normal and breath sounds normal. No respiratory distress. She has no wheezes.  Abdominal: Soft. Bowel sounds are normal. She exhibits no distension.  Musculoskeletal: She exhibits no edema.       Left shoulder: She exhibits decreased range of motion.  Neurological: She is alert. She exhibits normal muscle tone.  Skin: Skin is warm and dry. She is not diaphoretic. No pallor.  Psychiatric: She has a normal mood and affect. Her behavior is normal. Judgment and thought content normal. Her mood appears not anxious. She does not exhibit a depressed mood.   Diabetic Foot Form - Detailed   Diabetic Foot Exam - detailed Diabetic Foot exam was performed with the following findings:  Yes 06/15/2016  2:58 PM  Visual Foot Exam completed.:  Yes  Normal Range of Motion:  Yes Pulse Foot Exam completed.:  Yes  Right Dorsalis Pedis:  Present Left Dorsalis Pedis:  Present  Sensory Foot Exam Completed.:  Yes Swelling:  No Semmes-Weinstein Monofilament Test R Site 1-Great Toe:  Pos L Site 1-Great Toe:  Pos  R Site 4:  Neg L Site 4:  Neg  R Site 5:  Neg L Site 5:  Neg    Comments:  Diminished sensation both feet distally     Results for orders placed or performed in visit on 16/07/37  Basic Metabolic Panel (BMET)  Result Value Ref Range   Glucose 230 (H) 65 - 99 mg/dL   BUN 13 8 - 27 mg/dL   Creatinine, Ser 0.79 0.57 - 1.00 mg/dL   GFR calc non Af Amer 70 >59 mL/min/1.73   GFR calc Af  Amer 81 >59 mL/min/1.73   BUN/Creatinine Ratio 16 12 - 28   Sodium 139 134 - 144 mmol/L   Potassium 3.9 3.5 - 5.2 mmol/L   Chloride 99 96 - 106 mmol/L   CO2 22 18 - 29 mmol/L   Calcium 9.0 8.7 - 10.3 mg/dL  CBC  Result Value Ref Range   WBC 5.7 3.4 - 10.8 x10E3/uL   RBC 4.41 3.77 - 5.28 x10E6/uL   Hemoglobin 13.7 11.1 - 15.9 g/dL   Hematocrit 41.5 34.0 - 46.6 %   MCV 94 79 - 97 fL   MCH 31.1 26.6 - 33.0 pg   MCHC 33.0 31.5 - 35.7 g/dL   RDW 13.9 12.3 - 15.4 %   Platelets 188 150 - 379 x10E3/uL  INR/PT  Result Value Ref Range   INR 1.2 0.8 - 1.2   Prothrombin Time 12.5 (H) 9.1 - 12.0 sec      Assessment & Plan:   Problem List Items Addressed This Visit      Cardiovascular and Mediastinum   Atypical atrial flutter (HCC) (Chronic)    Seeing cardiologist      Atrial fibrillation [facility])    Seeing cardiologist; she is asymptomatic        Endocrine   Diabetes mellitus with neuropathy (HCC) (Chronic)    Foot exam by MD today; check A1c      Relevant Orders   Lipid panel   Hemoglobin A1c   Microalbumin / creatinine urine ratio     Other   Medication monitoring encounter    Check sgpt and Cr      Relevant Orders   CBC with Differential/Platelet   COMPLETE METABOLIC PANEL WITH GFR   Hyperlipidemia (Chronic)    Check lipids today; non-fasting (wheat bread with butter and coffee for breakfast, then 10 am goldfish crackers and powerade zero, cranberry juice)      Relevant Orders   Lipid panel      Follow up plan: Return in about 6 months (around 12/28/2016) for fasting labs and visit.  An after-visit summary was printed and given to the patient at [facility].  Please see the patient instructions which may contain other information and recommendations beyond what is mentioned above in the assessment and plan.  No orders of the defined types were placed in this encounter.   Orders Placed This Encounter  Procedures  . Lipid panel  . CBC with  Differential/Platelet  . Hemoglobin A1c  . COMPLETE METABOLIC PANEL WITH GFR  . Microalbumin / creatinine urine ratio

## 2016-06-15 NOTE — Patient Instructions (Signed)
Let's get labs today Do check your feet every night and let us know right away of any sores

## 2016-06-15 NOTE — Assessment & Plan Note (Signed)
Foot exam by MD today; check A1c 

## 2016-06-15 NOTE — Assessment & Plan Note (Signed)
Check sgpt and Cr 

## 2016-06-15 NOTE — Assessment & Plan Note (Signed)
Check lipids today; non-fasting (wheat bread with butter and coffee for breakfast, then 10 am goldfish crackers and powerade zero, cranberry juice)

## 2016-06-15 NOTE — Assessment & Plan Note (Signed)
Seeing cardiologist; she is asymptomatic

## 2016-06-15 NOTE — Assessment & Plan Note (Signed)
Seeing cardiologist 

## 2016-06-16 LAB — HEMOGLOBIN A1C
HEMOGLOBIN A1C: 5.9 % — AB (ref <5.7)
MEAN PLASMA GLUCOSE: 123 mg/dL

## 2016-06-16 LAB — MICROALBUMIN / CREATININE URINE RATIO
CREATININE, URINE: 83 mg/dL (ref 20–320)
MICROALB UR: 4.5 mg/dL
Microalb Creat Ratio: 54 mcg/mg creat — ABNORMAL HIGH (ref <30)

## 2016-06-17 ENCOUNTER — Encounter: Payer: Self-pay | Admitting: Family Medicine

## 2016-06-17 DIAGNOSIS — R809 Proteinuria, unspecified: Secondary | ICD-10-CM | POA: Insufficient documentation

## 2016-06-18 ENCOUNTER — Other Ambulatory Visit: Payer: Self-pay | Admitting: Cardiology

## 2016-06-18 ENCOUNTER — Other Ambulatory Visit: Payer: Self-pay | Admitting: Family Medicine

## 2016-06-18 DIAGNOSIS — I4891 Unspecified atrial fibrillation: Secondary | ICD-10-CM

## 2016-06-18 DIAGNOSIS — E785 Hyperlipidemia, unspecified: Secondary | ICD-10-CM

## 2016-06-18 DIAGNOSIS — I10 Essential (primary) hypertension: Secondary | ICD-10-CM

## 2016-06-18 DIAGNOSIS — M25512 Pain in left shoulder: Secondary | ICD-10-CM | POA: Diagnosis not present

## 2016-06-18 DIAGNOSIS — S42232D 3-part fracture of surgical neck of left humerus, subsequent encounter for fracture with routine healing: Secondary | ICD-10-CM | POA: Diagnosis not present

## 2016-06-20 ENCOUNTER — Other Ambulatory Visit: Payer: Self-pay | Admitting: Family Medicine

## 2016-06-27 ENCOUNTER — Other Ambulatory Visit: Payer: Self-pay | Admitting: Cardiology

## 2016-06-30 DIAGNOSIS — H401131 Primary open-angle glaucoma, bilateral, mild stage: Secondary | ICD-10-CM | POA: Diagnosis not present

## 2016-07-01 ENCOUNTER — Telehealth: Payer: Self-pay

## 2016-07-01 NOTE — Telephone Encounter (Signed)
Received letter from  [place] stating pt may be non-adherent to her medication. Spoke with pt ans she verified the route and dosage of the medication.

## 2016-07-03 ENCOUNTER — Other Ambulatory Visit: Payer: Self-pay | Admitting: Cardiovascular Disease

## 2016-07-06 ENCOUNTER — Telehealth: Payer: Self-pay | Admitting: *Deleted

## 2016-07-06 NOTE — Telephone Encounter (Signed)
Lasix 20mg  QD PRN was refilled by Sadie Haber, CMA on 4/6. Pt will need f/u office visit before further refills can be approved.  Forward to scheduling for 3 month f/u from January 2018.

## 2016-07-06 NOTE — Telephone Encounter (Signed)
Refill Request for Furosemide 20 mg tablet qd prn. Please advise if ok to refill.

## 2016-07-07 DIAGNOSIS — H401131 Primary open-angle glaucoma, bilateral, mild stage: Secondary | ICD-10-CM | POA: Diagnosis not present

## 2016-07-07 LAB — HM DIABETES EYE EXAM

## 2016-07-21 ENCOUNTER — Ambulatory Visit: Payer: Medicare Other | Admitting: Cardiology

## 2016-07-24 ENCOUNTER — Other Ambulatory Visit: Payer: Self-pay | Admitting: Cardiology

## 2016-07-24 NOTE — Telephone Encounter (Signed)
Review for refill. 

## 2016-07-28 ENCOUNTER — Ambulatory Visit: Payer: Medicare Other | Admitting: Cardiology

## 2016-07-31 ENCOUNTER — Other Ambulatory Visit: Payer: Self-pay | Admitting: *Deleted

## 2016-07-31 DIAGNOSIS — I4891 Unspecified atrial fibrillation: Secondary | ICD-10-CM

## 2016-08-11 ENCOUNTER — Ambulatory Visit
Admission: RE | Admit: 2016-08-11 | Discharge: 2016-08-11 | Disposition: A | Discharge status: Home / Self Care | Payer: Medicare Other | Source: Ambulatory Visit | Attending: Cardiology | Admitting: Cardiology

## 2016-08-11 DIAGNOSIS — E785 Hyperlipidemia, unspecified: Secondary | ICD-10-CM | POA: Diagnosis not present

## 2016-08-11 DIAGNOSIS — I5022 Chronic systolic (congestive) heart failure: Secondary | ICD-10-CM | POA: Diagnosis not present

## 2016-08-11 DIAGNOSIS — E119 Type 2 diabetes mellitus without complications: Secondary | ICD-10-CM | POA: Insufficient documentation

## 2016-08-11 DIAGNOSIS — I4892 Unspecified atrial flutter: Secondary | ICD-10-CM | POA: Insufficient documentation

## 2016-08-11 DIAGNOSIS — I340 Nonrheumatic mitral (valve) insufficiency: Secondary | ICD-10-CM | POA: Insufficient documentation

## 2016-08-11 DIAGNOSIS — I110 Hypertensive heart disease with heart failure: Secondary | ICD-10-CM | POA: Insufficient documentation

## 2016-08-11 DIAGNOSIS — I480 Paroxysmal atrial fibrillation: Secondary | ICD-10-CM | POA: Insufficient documentation

## 2016-08-11 NOTE — Progress Notes (Signed)
*  PRELIMINARY RESULTS* Echocardiogram 2D Echocardiogram has been performed.  Amber Wolfe 08/11/2016, 12:12 PM

## 2016-08-12 ENCOUNTER — Ambulatory Visit (INDEPENDENT_AMBULATORY_CARE_PROVIDER_SITE_OTHER): Payer: Medicare Other | Admitting: Cardiology

## 2016-08-12 ENCOUNTER — Encounter: Payer: Self-pay | Admitting: Cardiology

## 2016-08-12 ENCOUNTER — Other Ambulatory Visit: Payer: Self-pay | Admitting: Family Medicine

## 2016-08-12 VITALS — BP 210/70 | HR 48 | Ht 66.0 in | Wt 172.0 lb

## 2016-08-12 DIAGNOSIS — E784 Other hyperlipidemia: Secondary | ICD-10-CM

## 2016-08-12 DIAGNOSIS — I10 Essential (primary) hypertension: Secondary | ICD-10-CM | POA: Diagnosis not present

## 2016-08-12 DIAGNOSIS — E7849 Other hyperlipidemia: Secondary | ICD-10-CM

## 2016-08-12 DIAGNOSIS — I480 Paroxysmal atrial fibrillation: Secondary | ICD-10-CM | POA: Diagnosis not present

## 2016-08-12 MED ORDER — LISINOPRIL 20 MG PO TABS
20.0000 mg | ORAL_TABLET | Freq: Every day | ORAL | 3 refills | Status: DC
Start: 2016-08-12 — End: 2017-01-20

## 2016-08-12 MED ORDER — METOPROLOL TARTRATE 25 MG PO TABS: 12.5000 mg | ORAL_TABLET | Freq: Two times a day (BID) | ORAL | 3 refills | Status: DC

## 2016-08-12 NOTE — Progress Notes (Signed)
Cardiology Office Note   Date:  08/12/2016   ID:  Amber Wolfe, DOB [birth year], MRN [id number]  Referring Doctor:  Arnetha Courser, MD   Cardiologist:   Wende Bushy, MD   Reason for consultation:  Chief Complaint  Patient presents with  . other    3 month follow up. Patient had an ECHO yesterday 08/11/16. Meds reviewed verbally with patient.    Follow-up for A. fib   History of Present Illness: Amber Wolfe is a 83 y.o. female who presents forFollow-up for A. fib.  When asked about her blood pressure at home, she reports that it has been running high in the 190s. She has not called the office with her elevated blood pressures.  No shortness of breath. No further episodes of atrial fibrillation that is she is aware of. One episode of chest pain that woke her up from sleep, this was a while back, no recurrence since then. No chest pain with exertion.  ROS:  Please see the history of present illness. Aside from mentioned under HPI, all other systems are reviewed and negative.    Past Medical History:  Diagnosis Date  . Arthritis    In hands  . Atrial flutter ([place])    a. s/p successful TEE/DCCV 07/31/2015; b. on Xarelto; c. CHADS2VASc => 6 (CHF, HTN, age x 2, DM, female)  . Cataract   . Chronic systolic CHF (congestive heart failure) ([place])    a. echo 06/2015: EF 40%, basal HK, nl WM of mid and apical segments, mild to mod MR/TR; b. TEE 3/0/1601: nl LV systolic function, mild to mod MR, trivial TR  . Diabetes mellitus without complication ([place])    Type II  . GERD (gastroesophageal reflux disease)   . Glaucoma    Right Eye  . Hyperlipidemia   . Hypertension   . Moderate mitral regurgitation    a. see echo from 06/2015 and TEE 07/2015  . PAF (paroxysmal atrial fibrillation) ([place])    a.  01/2016 Admitted to [facility] with PAF -->amio added.    Past Surgical History:  Procedure Laterality Date  . CATARACT EXTRACTION Bilateral   . CESAREAN SECTION    . ELECTROPHYSIOLOGIC  STUDY N/A 07/31/2015   Procedure: CARDIOVERSION;  Surgeon: Wende Bushy, MD;  Location: [facility] ORS;  Service: Cardiovascular;  Laterality: N/A;  . ELECTROPHYSIOLOGIC STUDY N/A 04/03/2016   Procedure: CARDIOVERSION;  Surgeon: Minna Merritts, MD;  Location: [facility] ORS;  Service: Cardiovascular;  Laterality: N/A;  . TEE WITHOUT CARDIOVERSION N/A 07/31/2015   Procedure: TRANSESOPHAGEAL ECHOCARDIOGRAM (TEE);  Surgeon: Wende Bushy, MD;  Location: [facility] ORS;  Service: Cardiovascular;  Laterality: N/A;  . WISDOM TOOTH EXTRACTION    . Wrist Surgery Left      reports that she has never smoked. She has never used smokeless tobacco. She reports that she does not drink alcohol or use drugs.   family history includes Aneurysm in her daughter; Cataracts in her mother; Heart attack in her brother, father, maternal grandfather, mother, and paternal grandfather.   Current Outpatient Prescriptions  Medication Sig Dispense Refill  . acetaminophen (TYLENOL) 500 MG tablet Take 500-1,000 mg by mouth daily as needed for moderate pain or headache.    Marland Kitchen amiodarone (PACERONE) 200 MG tablet Take 1 tablet (200 mg total) by mouth daily. 30 tablet 0  . amiodarone (PACERONE) 200 MG tablet Take 1 tablet (200 mg total) by mouth daily. 30 tablet 2  . atorvastatin (LIPITOR) 20 MG tablet Take 1  tablet (20 mg total) by mouth at bedtime. 30 tablet 9  . furosemide (LASIX) 20 MG tablet TAKE ONE TABLET BY MOUTH DAILY AS NEEDED 30 tablet 0  . glimepiride (AMARYL) 1 MG tablet TAKE ONE (1) TABLET EACH DAY WITH BREAKFAST (NEW LOWER DOSE) 30 tablet 2  . latanoprost (XALATAN) 0.005 % ophthalmic solution Place 1 drop into both eyes at bedtime.     . timolol (TIMOPTIC) 0.5 % ophthalmic solution Place 1 drop into both eyes at bedtime.     Alveda Reasons 20 MG TABS tablet TAKE ONE (1) TABLET EACH DAY WITH SUPPER 30 tablet 2  . lisinopril (PRINIVIL,ZESTRIL) 20 MG tablet Take 1 tablet (20 mg total) by mouth daily. 90 tablet 3  . metoprolol tartrate  (LOPRESSOR) 25 MG tablet Take 0.5 tablets (12.5 mg total) by mouth 2 (two) times daily. 90 tablet 3   No current facility-administered medications for this visit.     Allergies: Patient has no known allergies.    PHYSICAL EXAM: VS:  BP (!) 210/70 (BP Location: Left Arm, Patient Position: Sitting, Cuff Size: Normal)   Pulse (!) 48   Ht 5\' 6"  (1.676 m)   Wt 172 lb (78 kg)   BMI 27.76 kg/m  , Body mass index is 27.76 kg/m. Wt Readings from Last 3 Encounters:  08/12/16 172 lb (78 kg)  06/15/16 174 lb 12.8 oz (79.3 kg)  04/21/16 170 lb 6.4 oz (77.3 kg)    GENERAL:  well developed, well nourished, not in acute distress HEENT: normocephalic, pink conjunctivae, anicteric sclerae, no xanthelasma, normal dentition, oropharynx clear NECK:  no neck vein engorgement, JVP normal, no hepatojugular reflux, carotid upstroke brisk and symmetric, no bruit, no thyromegaly, no lymphadenopathy LUNGS:  good respiratory effort, clear to auscultation bilaterally CV:  PMI not displaced, no thrills, no lifts, S1 and S2 within normal limits, no palpable S3 or S4, no murmurs, no rubs, no gallops ABD:  Soft, nontender, nondistended, normoactive bowel sounds, no abdominal aortic bruit, no hepatomegaly, no splenomegaly MS: nontender back, no kyphosis, no scoliosis, no joint deformities EXT:  2+ DP/PT pulses, no edema, no varicosities, no cyanosis, no clubbing SKIN: warm, nondiaphoretic, normal turgor, no ulcers NEUROPSYCH: alert, oriented to person, place, and time, sensory/motor grossly intact, normal mood, appropriate affect   Recent Labs: 02/17/2016: TSH 2.747 06/15/2016: ALT 10; BUN 13; Creat 0.60; Hemoglobin 12.3; Platelets 185; Potassium 3.9; Sodium 142   Lipid Panel    Component Value Date/Time   CHOL 124 06/15/2016 1459   CHOL 159 05/15/2015 1619   TRIG 76 06/15/2016 1459   TRIG 124 05/15/2015 1619   HDL 55 06/15/2016 1459   CHOLHDL 2.3 06/15/2016 1459   VLDL 15 06/15/2016 1459   VLDL 25  05/15/2015 1619   LDLCALC 54 06/15/2016 1459     Other studies Reviewed:  EKG:   EKG from 02/27/2016 was personally reviewed by me and it revealed atrial fibrillation, ventricular rate 70 BPM.  EKG from 08/06/2015 was personally reviewed by me and it showed sinus rhythm, 65 BPM.  EKG from 07/22/2015 was personally reviewed by me and it showed atrial flutter with variable conduction, ventricular rate of 111 bpm. Nonspecific ST-T wave changes.  The ekg from 06/18/2015  was personally reviewed by me and it reveals atrial flutter, with variable AV block, ventricular rate of 79 BPM.  EKG from 06/11/2015 showed atrial fibrillation, 124 ventricular rate. Nonspecific ST-T wave changes.  EKG from 04/20/2014 showed sinus rhythm.  Additional studies/ records that were  reviewed personally reviewed by me today include:    Echocardiogram 07/10/2015: Left ventricle: The cavity size was normal. Wall thickness was  normal. The estimated ejection fraction was approximately 40%.  There is hypokinesis in the basal segments, normal wall motion in  the mid and apical segments. - Mitral valve: Calcified annulus. There was mild to moderate  regurgitation. - Left atrium: The atrium was moderately dilated. - Right atrium: The atrium was moderately dilated. - Tricuspid valve: There was mild-moderate regurgitation.  Holter 07/10/2015: Overall rhythm appears to be atrial flutter. Average heart rate of 78 BPM.  Minimum heart rate of 47 bpm at 4:53 AM 07/11/2015. 3% of total number of beats were in bradycardia. Maximum heart rate of 189 bpm at 10:19 AM of 07/10/2015. 11% of the total number of beats were in tachycardia.  Pharmacologic nuclear stress test 06/21/2015: Pharmacological myocardial perfusion imaging study with no significant ischemia Normal wall motion, EF estimated at 60% No EKG changes concerning for ischemia at peak stress or in recovery. Baseline EKG with atrial fibrillation. Rare  PVC noted. Low risk scan  TEE 07/31/2015:  Study Conclusions  - Left atrium: No evidence of thrombus in the atrial cavity or  appendage. Cardioversion with 200 J  Echo with contrast 08/11/2016: Left ventricle: The cavity size was normal. Wall thickness was   increased in a pattern of moderate LVH. Systolic function was   normal. The estimated ejection fraction was in the range of 60%   to 65%. Features are consistent with a pseudonormal left   ventricular filling pattern, with concomitant abnormal relaxation   and increased filling pressure (grade 2 diastolic dysfunction).   Acoustic contrast opacification revealed no evidence ofthrombus. - Mitral valve: There was mild to moderate regurgitation. - Left atrium: The atrium was mildly dilated. - Right atrium: The atrium was mildly to moderately dilated. - Pulmonary arteries: Systolic pressure was mildly to moderately   increased, in the range of 40 mm Hg to 45 mm Hg.  ASSESSMENT AND PLAN:  Atrial flutter, as noted on Holter monitor. Atrial fibrillation  Status post cardioversion 07/31/2015, 200 J Status post cardioversion 150 J 04/03/2016 on amiodarone  Sinus bradycardia Recommend to decrease metoprolol to 12.5 twice a day. With concerns for diastolic dysfunction on echo (likely HTN related) maintain amiodarone at 200 mg daily for now. Qt/Qtc wnl today. Check thyroid function test, and PFTs. Recent blood work shows LFTs within normal limits. Continue Xarelto20 mg by mouth daily. Pt tolerating this well. No falls. On future visits, we'll continue to evaluate if possible to switch to Multaq.    Hypertension Blood pressure has been high. Recommend to increase lisinopril to 20 mg daily. Check BMP in 1 week. There has been some dietary indiscretion as well, recommend strict sodium restriction. Continue to monitor blood pressure.  Hyperlipidemia LDL is at goal. Continue statin therapy. PCP following labs.   Current medicines  are reviewed at length with the patient today.  The patient does not have concerns regarding medicines.  Labs/ tests ordered today include:  Orders Placed This Encounter  Procedures  . Basic metabolic panel  . TSH  . T4, free  . Pulmonary Function Test [facility] Only  . EKG 12-Lead    I had a lengthy and detailed discussion with the patient regarding diagnoses, prognosis, diagnostic options, treatment options, and side effects of medications.   I counseled the patient on importance of lifestyle modification including heart healthy diet, regular physical activity.   Disposition:   FU  With Cardiology in 3 months or sooner. patient was advised to call the office for elevated blood pressure  I spent at least 40 minutes with the patient today and more than 50% of the time was spent counseling the patient and coordinating care.     Signed, Wende Bushy, MD  08/12/2016 4:01 PM    [facility]

## 2016-08-12 NOTE — Patient Instructions (Addendum)
Medication Instructions:  Your physician has recommended you make the following change in your medication:  1. CHANGE metoprolol 25 mg to 1/2 tablet twice a day 2. INCREASE Lisinopril 20 mg once daily    Labwork: Your physician recommends that you return for lab work in: 1 week for BMP, TSH, T4   Testing/Procedures: Your physician has recommended that you have a pulmonary function test. Pulmonary Function Tests are a group of tests that measure how well air moves in and out of your lungs.  Please see additional paper for information.  Follow-Up: Your physician recommends that you schedule a follow-up appointment in: 3 months with Dr. Fletcher Anon  It was a pleasure seeing you today here in the office. Please do not hesitate to give [place] a call back if you have any further questions. [place], BSN     Pulmonary Function Tests Pulmonary function tests (PFTs) are used to measure how well your lungs work, find out what is causing your lung problems, and figure out the best treatment for you. You may have PFTs:  When you have an illness involving the lungs.  To follow changes in your lung function over time if you have a chronic lung disease.  If you are an Nature conservation officer. This checks the effects of being exposed to chemicals over a long period of time.  To check lung function before having surgery or other procedures.  To check your lungs if you smoke.  To check if prescribed medicines or treatments are helping your lungs. Your results will be compared to the expected lung function of someone with healthy lungs who is similar to you in:  Age.  Gender.  Height.  Weight.  Race or ethnicity. This is done to show how your lungs compare to normal lung function (percent predicted). This is how your health care provider knows if your lung function is normal or not. If you have had PFTs done before, your health care provider will compare your current results  with past results. This shows if your lung function is better, worse, or the same as before. Tell a health care provider about:  Any allergies you have.  All medicines you are taking, including inhaler or nebulizer medicines, vitamins, herbs, eye drops, creams, and over-the-counter medicines.  Any blood disorders you have.  Any surgeries you have had, especially recent eye surgery, abdominal surgery, or chest surgery. These can make PFTs difficult or unsafe.  Any medical conditions you have, including chest pain or heart problems, tuberculosis, or respiratory infections such as pneumonia, a cold, or the flu.  Any fear of being in closed spaces (claustrophobia). Some of your tests may be in a closed space. What are the risks? Generally, this is a safe procedure. However, problems may occur, including:  Light-headedness due to over-breathing (hyperventilation).  An asthma attack from deep breathing.  A collapsed lung. What happens before the procedure?  Take over-the-counter and prescription medicines only as told by your health care provider. If you take inhaler or nebulizer medicines, ask your health care provider which medicines you should take on the day of your testing. Some inhaler medicines may interfere with PFTs if they are taken shortly before the tests.  Follow your health care provider's instructions on eating and drinking restrictions. This may include avoiding eating large meals and drinking alcohol before the testing.  Do not use any products that contain nicotine or tobacco, such as cigarettes and e-cigarettes. If you need help quitting,  ask your health care provider.  Wear comfortable clothing that will not interfere with breathing. What happens during the procedure?  You will be given a soft nose clip to wear. This is done so all of your breaths will go through your mouth instead of your nose.  You will be given a germ-free (sterile) mouthpiece. It will be attached  to a machine that measures your breathing (spirometer).  You will be asked to do various breathing maneuvers. The maneuvers will be done by breathing in (inhaling) and breathing out (exhaling). You may be asked to repeat the maneuvers several times before the testing is done.  It is important to follow the instructions exactly to get accurate results. Make sure to blow as hard and as fast as you can when you are told to do so.  You may be given a medicine that makes the small air passages in your lungs larger (bronchodilator) after testing has been done. This medicine will make it easier for you to breathe.  The tests will be repeated after the bronchodilator has taken effect.  You will be monitored carefully during the procedure for faintness, dizziness, trouble breathing, or any other problems. The procedure may vary among health care providers and hospitals. What happens after the procedure?  It is up to you to get your test results. Ask your health care provider, or the department that is doing the tests, when your results will be ready. After you have received your test results, talk with your health care provider about treatment options, if necessary. Summary  Pulmonary function tests (PFTs) are used to measure how well your lungs work, find out what is causing your lung problems, and figure out the best treatment for you.  Wear comfortable clothing that will not interfere with breathing.  It is up to you to get your test results. After you have received them, talk with your health care provider about treatment options, if necessary. This information is not intended to replace advice given to you by your health care provider. Make sure you discuss any questions you have with your health care provider. Document Released: 11/07/2003 Document Revised: 02/06/2016 Document Reviewed: 02/06/2016 Elsevier Interactive Patient Education  2017 [employer].

## 2016-08-12 NOTE — Telephone Encounter (Signed)
March 2018 labs reviewed, sgpt and lipids Rx approved

## 2016-08-13 ENCOUNTER — Ambulatory Visit: Payer: Medicare Other | Admitting: Cardiology

## 2016-08-19 ENCOUNTER — Telehealth: Payer: Self-pay | Admitting: Cardiology

## 2016-08-19 NOTE — Telephone Encounter (Signed)
[employer] wanted to get EF since patient is in their heart failure program. Reviewed that it was 60-65% but there were other abnormal findings. She verbalized understanding and was appreciative for the call back. She had no further questions at this time.

## 2016-08-19 NOTE — Telephone Encounter (Signed)
Nurse with [facility] needs ejection fraction from pt echo. Please call. Fax # if we choose to fax [phone]

## 2016-08-27 ENCOUNTER — Other Ambulatory Visit: Payer: Self-pay | Admitting: *Deleted

## 2016-08-27 MED ORDER — FUROSEMIDE 20 MG PO TABS: 20.0000 mg | ORAL_TABLET | Freq: Every day | ORAL | 3 refills | Status: DC | PRN

## 2016-09-02 ENCOUNTER — Telehealth: Payer: Self-pay | Admitting: Family Medicine

## 2016-09-02 MED ORDER — AMLODIPINE BESYLATE 5 MG PO TABS: 5.0000 mg | ORAL_TABLET | Freq: Every day | ORAL | 0 refills | Status: DC

## 2016-09-02 NOTE — Telephone Encounter (Signed)
  Last A1c was 5.9 Lab Results  Component Value Date   HGBA1C 5.9 (H) 06/15/2016  She does not need to check her FSBS  I called patient She hasn't been eating right; fish sticks, tater tots avois salt, avoid decongestants Avoid salt substitutes BP had been doing great then went high Reviewed cardiologist's note, increase lisinopril from 10 mg to 20 mg and was supposed to recheck and get labs a few weeks ago, sunquest from cardiologist order Add amlodipine 5 mg to Amber Wolfe Get back in see cardiologist asap Call 911 if any s/s of stroke ------------------------------------- Please let heart failure nurse know that I talked to patient Wednesday around 5:20 pm I'm adding amlodipine 5 mg Avoid salt substitutes, decongestants, salt Patient is overdue for labs, to go to hospital for cardiologist's blood work on higher dose of ACE-I She should see new cardiologist ASAP

## 2016-09-02 NOTE — Telephone Encounter (Signed)
Amber from [employer] and DM program--states pt blood pressure today and yesterday was 204/90 with dizziness. Please call pt today concerning this  Also would like to know what her last A1C  Would like to know if patient should be checking her blood sugar levels at home.   Please return call [phone] ext [phone]

## 2016-09-03 NOTE — Telephone Encounter (Signed)
Left a message and asked Amber Wolfe to give me a call back

## 2016-09-15 ENCOUNTER — Other Ambulatory Visit: Payer: Self-pay

## 2016-09-15 MED ORDER — RIVAROXABAN 20 MG PO TABS: ORAL_TABLET | ORAL | 3 refills | Status: DC

## 2016-09-16 ENCOUNTER — Other Ambulatory Visit: Payer: Self-pay | Admitting: Family Medicine

## 2016-09-28 ENCOUNTER — Other Ambulatory Visit
Admission: RE | Admit: 2016-09-28 | Discharge: 2016-09-28 | Disposition: A | Discharge status: Home / Self Care | Payer: Medicare Other | Source: Ambulatory Visit | Attending: Cardiology | Admitting: Cardiology

## 2016-09-28 DIAGNOSIS — I480 Paroxysmal atrial fibrillation: Secondary | ICD-10-CM | POA: Insufficient documentation

## 2016-09-28 LAB — BASIC METABOLIC PANEL
Anion gap: 5 (ref 5–15)
BUN: 17 mg/dL (ref 6–20)
CALCIUM: 8.8 mg/dL — AB (ref 8.9–10.3)
CO2: 25 mmol/L (ref 22–32)
Chloride: 106 mmol/L (ref 101–111)
Creatinine, Ser: 0.7 mg/dL (ref 0.44–1.00)
Glucose, Bld: 131 mg/dL — ABNORMAL HIGH (ref 65–99)
Potassium: 3.7 mmol/L (ref 3.5–5.1)
Sodium: 136 mmol/L (ref 135–145)

## 2016-09-28 LAB — T4, FREE: FREE T4: 1.02 ng/dL (ref 0.61–1.12)

## 2016-09-28 LAB — TSH: TSH: 3.14 u[IU]/mL (ref 0.350–4.500)

## 2016-10-01 ENCOUNTER — Other Ambulatory Visit: Payer: Self-pay | Admitting: Family Medicine

## 2016-10-06 ENCOUNTER — Telehealth: Payer: Self-pay | Admitting: Family Medicine

## 2016-10-06 ENCOUNTER — Ambulatory Visit: Payer: Medicare Other | Attending: Cardiology

## 2016-10-06 DIAGNOSIS — I480 Paroxysmal atrial fibrillation: Secondary | ICD-10-CM | POA: Diagnosis not present

## 2016-10-06 NOTE — Telephone Encounter (Signed)
I ordered a DEXA scan in December She needs to have a DEXA scan please Thank you

## 2016-10-06 NOTE — Telephone Encounter (Signed)
-----   Message from Dennard Schaumann, [place] sent at 10/06/2016  3:21 PM EDT ----- Doreene Burke with [facility] wants to know if this patient would be willing to have a DEXA scan done or take medication for prevention  Is it ok to ask this patient if she is willing to do either one?  If you have any questions, feel free to contact [place] at [phone].  Please advise.

## 2016-10-07 NOTE — Telephone Encounter (Signed)
Patient has been scheduled to have her DEXA on Wednesday, Septmeber 5, 2018 @  9:40am at the [facility]  Patient has been informed.

## 2016-10-23 ENCOUNTER — Other Ambulatory Visit: Payer: Self-pay | Admitting: Cardiovascular Disease

## 2016-10-23 ENCOUNTER — Telehealth: Payer: Self-pay

## 2016-10-23 ENCOUNTER — Other Ambulatory Visit: Payer: Self-pay | Admitting: Family Medicine

## 2016-10-23 MED ORDER — AMIODARONE HCL 200 MG PO TABS: 200.0000 mg | ORAL_TABLET | Freq: Every day | ORAL | 3 refills | Status: DC

## 2016-10-23 NOTE — Telephone Encounter (Signed)
Refill sent for amiodarone 200 mg one tablet daily.

## 2016-10-23 NOTE — Telephone Encounter (Signed)
I do not prescribe pacerone (amiodarone) or her metoprolol She should get these from her cardiologist please Thank you

## 2016-10-23 NOTE — Telephone Encounter (Signed)
Patient notified to contact cardiologist

## 2016-12-02 ENCOUNTER — Ambulatory Visit
Admission: RE | Admit: 2016-12-02 | Discharge: 2016-12-02 | Disposition: A | Discharge status: Home / Self Care | Payer: Medicare Other | Source: Ambulatory Visit | Attending: Family Medicine | Admitting: Family Medicine

## 2016-12-02 DIAGNOSIS — Z780 Asymptomatic menopausal state: Secondary | ICD-10-CM | POA: Diagnosis not present

## 2016-12-02 DIAGNOSIS — M810 Age-related osteoporosis without current pathological fracture: Secondary | ICD-10-CM | POA: Diagnosis not present

## 2016-12-02 DIAGNOSIS — E119 Type 2 diabetes mellitus without complications: Secondary | ICD-10-CM | POA: Insufficient documentation

## 2016-12-02 DIAGNOSIS — Z13820 Encounter for screening for osteoporosis: Secondary | ICD-10-CM | POA: Insufficient documentation

## 2016-12-02 DIAGNOSIS — R296 Repeated falls: Secondary | ICD-10-CM | POA: Insufficient documentation

## 2016-12-07 ENCOUNTER — Telehealth: Payer: Self-pay

## 2016-12-07 NOTE — Telephone Encounter (Signed)
Patient called please review bone density

## 2016-12-08 NOTE — Telephone Encounter (Signed)
Please call patient and let her know that her bone density shows osteoporosis. This appears to be a new diagnosis, and there are many different treatment options. I recommend she come in to see her PCP, Dr. Sanda Klein, upon her return to discuss treatment options together.  This is not something that requires urgent attention. Please discuss fall prevention with the patient including having good lighting and handrails throughout her home and along any steps, as well as removing any loose throw rugs. Thank you!

## 2016-12-09 NOTE — Telephone Encounter (Signed)
Patient notified already has an appt setup

## 2016-12-17 ENCOUNTER — Ambulatory Visit (INDEPENDENT_AMBULATORY_CARE_PROVIDER_SITE_OTHER): Payer: Medicare Other | Admitting: Family Medicine

## 2016-12-17 ENCOUNTER — Encounter: Payer: Self-pay | Admitting: Family Medicine

## 2016-12-17 DIAGNOSIS — Z5181 Encounter for therapeutic drug level monitoring: Secondary | ICD-10-CM | POA: Diagnosis not present

## 2016-12-17 DIAGNOSIS — E784 Other hyperlipidemia: Secondary | ICD-10-CM

## 2016-12-17 DIAGNOSIS — E1140 Type 2 diabetes mellitus with diabetic neuropathy, unspecified: Secondary | ICD-10-CM

## 2016-12-17 DIAGNOSIS — R001 Bradycardia, unspecified: Secondary | ICD-10-CM

## 2016-12-17 DIAGNOSIS — R809 Proteinuria, unspecified: Secondary | ICD-10-CM

## 2016-12-17 DIAGNOSIS — I10 Essential (primary) hypertension: Secondary | ICD-10-CM | POA: Diagnosis not present

## 2016-12-17 DIAGNOSIS — I480 Paroxysmal atrial fibrillation: Secondary | ICD-10-CM

## 2016-12-17 DIAGNOSIS — E7849 Other hyperlipidemia: Secondary | ICD-10-CM

## 2016-12-17 DIAGNOSIS — M810 Age-related osteoporosis without current pathological fracture: Secondary | ICD-10-CM

## 2016-12-17 MED ORDER — BLOOD PRESSURE KIT: PACK | 0 refills | Status: DC

## 2016-12-17 NOTE — Assessment & Plan Note (Signed)
Continue Factor Xa inhibitor; sees cardiologist tomorrow

## 2016-12-17 NOTE — Assessment & Plan Note (Signed)
Recheck urine today if able

## 2016-12-17 NOTE — Assessment & Plan Note (Signed)
Try to avoid fatty meats; check lipids today; continue statin

## 2016-12-17 NOTE — Assessment & Plan Note (Signed)
Discussed scan results; vit D, calcium, fall precautions discussed

## 2016-12-17 NOTE — Assessment & Plan Note (Signed)
Check liver and kidneys and CBC

## 2016-12-17 NOTE — Assessment & Plan Note (Signed)
Stable; check A1c and urine microalbumin

## 2016-12-17 NOTE — Progress Notes (Signed)
BP (!) 154/64 (BP Location: Left Arm, Cuff Size: Normal)   Pulse (!) 42   Temp 98 F (36.7 C)   Resp 14   Ht 5' 6"  (1.676 m)   Wt 172 lb 8 oz (78.2 kg)   SpO2 98%   BMI 27.84 kg/m    Subjective:    Patient ID: Amber Wolfe, female    DOB: 12-04-1931, 83 y.o.   MRN: [id number]  HPI: Amber Wolfe is a 83 y.o. female  Chief Complaint  Patient presents with  . Follow-up    6 month    HPI She is here for f/u  Type 2 diabetes mellitus; some numbness in toes; no sores on the feet; no lower sugars; brother had type 2 diabetes  She goes to see Dr. Fletcher Anon tomorrow (cardiologist); she does not have any chest pain; she has a little fatigue  High cholesterol; trying to avoid fatty meats; taking statin; no muscle aches; mother had high cholesterol and maybe brother too  HTN; on CCB; had a monitor, but can't check now; looking for wrist monitor  On Xarelto; no problems with bleeding from nose or gums; no hematuria or blood in stool  Broke her shoulder (left) in the spring; no surgery needed; she fell a few times, no injuries to head, just right leg shin; declined PT  Osteoporosis; a fall a few weeks ago; likes cheese, but limits; no milk; loves dark green leafy veggies; not much walking because of being unsteady; not much sun  Depression screen [facility] 2/9 12/17/2016 06/15/2016 03/24/2016 09/19/2015 05/20/2015  Decreased Interest 0 0 0 0 1  Down, Depressed, Hopeless 0 0 0 0 0  PHQ - 2 Score 0 0 0 0 1  Altered sleeping - - - - -  Tired, decreased energy - - - - -  Change in appetite - - - - -  Feeling bad or failure about yourself  - - - - -  Trouble concentrating - - - - -  Moving slowly or fidgety/restless - - - - -  Suicidal thoughts - - - - -  PHQ-9 Score - - - - -  Difficult doing work/chores - - - - Not difficult at all    Relevant past medical, surgical, family and social history reviewed Past Medical History:  Diagnosis Date  . Arthritis    In hands  . Atrial flutter  ([place])    a. s/p successful TEE/DCCV 07/31/2015; b. on Xarelto; c. CHADS2VASc => 6 (CHF, HTN, age x 2, DM, female)  . Cataract   . Chronic systolic CHF (congestive heart failure) ([place])    a. echo 06/2015: EF 40%, basal HK, nl WM of mid and apical segments, mild to mod MR/TR; b. TEE 11/03/7670: nl LV systolic function, mild to mod MR, trivial TR  . Diabetes mellitus without complication ([place])    Type II  . GERD (gastroesophageal reflux disease)   . Glaucoma    Right Eye  . Hyperlipidemia   . Hypertension   . Moderate mitral regurgitation    a. see echo from 06/2015 and TEE 07/2015  . PAF (paroxysmal atrial fibrillation) ([place])    a.  01/2016 Admitted to [facility] with PAF -->amio added.   Past Surgical History:  Procedure Laterality Date  . CATARACT EXTRACTION Bilateral   . CESAREAN SECTION    . ELECTROPHYSIOLOGIC STUDY N/A 07/31/2015   Procedure: CARDIOVERSION;  Surgeon: Wende Bushy, MD;  Location: [facility] ORS;  Service: Cardiovascular;  Laterality:  N/A;  . ELECTROPHYSIOLOGIC STUDY N/A 04/03/2016   Procedure: CARDIOVERSION;  Surgeon: Minna Merritts, MD;  Location: [facility] ORS;  Service: Cardiovascular;  Laterality: N/A;  . TEE WITHOUT CARDIOVERSION N/A 07/31/2015   Procedure: TRANSESOPHAGEAL ECHOCARDIOGRAM (TEE);  Surgeon: Wende Bushy, MD;  Location: [facility] ORS;  Service: Cardiovascular;  Laterality: N/A;  . WISDOM TOOTH EXTRACTION    . Wrist Surgery Left    Family History  Problem Relation Age of Onset  . Cataracts Mother   . Heart attack Mother   . Heart attack Father   . Heart attack Brother   . Aneurysm Daughter   . Heart attack Maternal Grandfather   . Heart attack Paternal Grandfather    Social History   Social History  . Marital status: Widowed    Spouse name: N/A  . Number of children: N/A  . Years of education: N/A   Occupational History  . Not on file.   Social History Main Topics  . Smoking status: Never Smoker  . Smokeless tobacco: Never Used  . Alcohol use No  . Drug use:  No  . Sexual activity: Not Currently   Other Topics Concern  . Not on file   Social History Narrative  . No narrative on file    Interim medical history since last visit reviewed. Allergies and medications reviewed  Review of Systems Per HPI unless specifically indicated above     Objective:    BP (!) 154/64 (BP Location: Left Arm, Cuff Size: Normal)   Pulse (!) 42   Temp 98 F (36.7 C)   Resp 14   Ht 5' 6"  (1.676 m)   Wt 172 lb 8 oz (78.2 kg)   SpO2 98%   BMI 27.84 kg/m   Wt Readings from Last 3 Encounters:  12/17/16 172 lb 8 oz (78.2 kg)  08/12/16 172 lb (78 kg)  06/15/16 174 lb 12.8 oz (79.3 kg)    Physical Exam  Constitutional: She appears well-developed and well-nourished. No distress.  HENT:  Head: Normocephalic and atraumatic.  Eyes: EOM are normal. No scleral icterus.  Neck: No thyromegaly present.  Cardiovascular: Regular rhythm and normal heart sounds.   No extrasystoles are present. Bradycardia present.   No murmur heard. Sounds to be in sinus brady today  Pulmonary/Chest: Effort normal and breath sounds normal. No respiratory distress. She has no wheezes.  Abdominal: Soft. Bowel sounds are normal. She exhibits no distension.  Musculoskeletal: She exhibits no edema.       Left shoulder: She exhibits decreased range of motion.  Neurological: She is alert. She exhibits normal muscle tone.  Skin: Skin is warm and dry. She is not diaphoretic. No pallor.  Psychiatric: She has a normal mood and affect. Her behavior is normal. Judgment and thought content normal. Her mood appears not anxious. She does not exhibit a depressed mood.    Results for orders placed or performed during the hospital encounter of 09/28/16  TSH  Result Value Ref Range   TSH 3.140 0.350 - 4.500 uIU/mL  T4, free  Result Value Ref Range   Free T4 1.02 0.61 - 1.12 ng/dL  Basic metabolic panel  Result Value Ref Range   Sodium 136 135 - 145 mmol/L   Potassium 3.7 3.5 - 5.1 mmol/L    Chloride 106 101 - 111 mmol/L   CO2 25 22 - 32 mmol/L   Glucose, Bld 131 (H) 65 - 99 mg/dL   BUN 17 6 - 20 mg/dL   Creatinine,  Ser 0.70 0.44 - 1.00 mg/dL   Calcium 8.8 (L) 8.9 - 10.3 mg/dL   GFR calc non Af Amer >60 >60 mL/min   GFR calc Af Amer >60 >60 mL/min   Anion gap 5 5 - 15      Assessment & Plan:   Problem List Items Addressed This Visit      Cardiovascular and Mediastinum   Essential hypertension, benign (Chronic)    Try to avoid salt and pay attention to canned food      Atrial fibrillation (HCC)    Continue Factor Xa inhibitor; sees cardiologist tomorrow        Endocrine   Diabetes mellitus with neuropathy (HCC) (Chronic)    Stable; check A1c and urine microalbumin      Relevant Orders   Microalbumin / creatinine urine ratio   Lipid panel   Hemoglobin A1c     Musculoskeletal and Integument   Osteoporosis    Discussed scan results; vit D, calcium, fall precautions discussed      Relevant Orders   Ambulatory referral to Rheumatology     Other   Microalbuminuria    Recheck urine today if able      Relevant Orders   Microalbumin / creatinine urine ratio   Medication monitoring encounter    Check liver and kidneys and CBC      Relevant Orders   COMPLETE METABOLIC PANEL WITH GFR   CBC with Differential/Platelet   Hyperlipidemia (Chronic)    Try to avoid fatty meats; check lipids today; continue statin      Relevant Orders   Lipid panel   Bradycardia    Check TSH on amiodarone; see cardiologist tomorrow      Relevant Orders   TSH       Follow up plan: Return in about 6 months (around 06/16/2017) for twenty minute follow-up with fasting labs.  An after-visit summary was printed and given to the patient at [facility].  Please see the patient instructions which may contain other information and recommendations beyond what is mentioned above in the assessment and plan.  Meds ordered this encounter  Medications  . Blood Pressure KIT    Sig:  Check blood pressure daily or as needed if tired or fatigued; dx I10    Dispense:  1 each    Refill:  0    Orders Placed This Encounter  Procedures  . Microalbumin / creatinine urine ratio  . Lipid panel  . Hemoglobin A1c  . COMPLETE METABOLIC PANEL WITH GFR  . TSH  . CBC with Differential/Platelet  . Ambulatory referral to Rheumatology

## 2016-12-17 NOTE — Assessment & Plan Note (Signed)
Check TSH on amiodarone; see cardiologist tomorrow

## 2016-12-17 NOTE — Assessment & Plan Note (Signed)
Try to avoid salt and pay attention to canned food

## 2016-12-17 NOTE — Patient Instructions (Addendum)
Please do start 1000 iu of vitamin D3 once a day We'll refer to a doctor to discuss osteoporosis options   Osteoporosis Osteoporosis happens when your bones become thinner and weaker. Weak bones can break (fracture) more easily when you slip or fall. Bones most at risk of breaking are in the hip, wrist, and spine. Follow these instructions at home:  Get enough calcium and vitamin D. These nutrients are good for your bones.  Exercise as told by your doctor.  Do not use any tobacco products. This includes cigarettes, chewing tobacco, and electronic cigarettes. If you need help quitting, ask your doctor.  Limit the amount of alcohol you drink.  Take medicines only as told by your doctor.  Keep all follow-up visits as told by your doctor. This is important.  Take care at home to prevent falls. Some ways to do this are: ? Keep rooms well lit and tidy. ? Put safety rails on your stairs. ? Put a rubber mat in the bathroom and other places that are often wet or slippery. Get help right away if:  You fall.  You hurt yourself. This information is not intended to replace advice given to you by your health care provider. Make sure you discuss any questions you have with your health care provider. Document Released: 06/08/2011 Document Revised: 08/22/2015 Document Reviewed: 08/24/2013 Elsevier Interactive Patient Education  2018 [place] in the Home Falls can cause injuries. They can happen to people of all ages. There are many things you can do to make your home safe and to help prevent falls. What can I do on the outside of my home?  Regularly fix the edges of walkways and driveways and fix any cracks.  Remove anything that might make you trip as you walk through a door, such as a raised step or threshold.  Trim any bushes or trees on the path to your home.  Use bright outdoor lighting.  Clear any walking paths of anything that might make someone trip, such as  rocks or tools.  Regularly check to see if handrails are loose or broken. Make sure that both sides of any steps have handrails.  Any raised decks and porches should have guardrails on the edges.  Have any leaves, snow, or ice cleared regularly.  Use sand or salt on walking paths during winter.  Clean up any spills in your garage right away. This includes oil or grease spills. What can I do in the bathroom?  Use night lights.  Install grab bars by the toilet and in the tub and shower. Do not use towel bars as grab bars.  Use non-skid mats or decals in the tub or shower.  If you need to sit down in the shower, use a plastic, non-slip stool.  Keep the floor dry. Clean up any water that spills on the floor as soon as it happens.  Remove soap buildup in the tub or shower regularly.  Attach bath mats securely with double-sided non-slip rug tape.  Do not have throw rugs and other things on the floor that can make you trip. What can I do in the bedroom?  Use night lights.  Make sure that you have a light by your bed that is easy to reach.  Do not use any sheets or blankets that are too big for your bed. They should not hang down onto the floor.  Have a firm chair that has side arms. You can use this for support  while you get dressed.  Do not have throw rugs and other things on the floor that can make you trip. What can I do in the kitchen?  Clean up any spills right away.  Avoid walking on wet floors.  Keep items that you use a lot in easy-to-reach places.  If you need to reach something above you, use a strong step stool that has a grab bar.  Keep electrical cords out of the way.  Do not use floor polish or wax that makes floors slippery. If you must use wax, use non-skid floor wax.  Do not have throw rugs and other things on the floor that can make you trip. What can I do with my stairs?  Do not leave any items on the stairs.  Make sure that there are handrails on  both sides of the stairs and use them. Fix handrails that are broken or loose. Make sure that handrails are as long as the stairways.  Check any carpeting to make sure that it is firmly attached to the stairs. Fix any carpet that is loose or worn.  Avoid having throw rugs at the top or bottom of the stairs. If you do have throw rugs, attach them to the floor with carpet tape.  Make sure that you have a light switch at the top of the stairs and the bottom of the stairs. If you do not have them, ask someone to add them for you. What else can I do to help prevent falls?  Wear shoes that: ? Do not have high heels. ? Have rubber bottoms. ? Are comfortable and fit you well. ? Are closed at the toe. Do not wear sandals.  If you use a stepladder: ? Make sure that it is fully opened. Do not climb a closed stepladder. ? Make sure that both sides of the stepladder are locked into place. ? Ask someone to hold it for you, if possible.  Clearly mark and make sure that you can see: ? Any grab bars or handrails. ? First and last steps. ? Where the edge of each step is.  Use tools that help you move around (mobility aids) if they are needed. These include: ? Canes. ? Walkers. ? Scooters. ? Crutches.  Turn on the lights when you go into a dark area. Replace any light bulbs as soon as they burn out.  Set up your furniture so you have a clear path. Avoid moving your furniture around.  If any of your floors are uneven, fix them.  If there are any pets around you, be aware of where they are.  Review your medicines with your doctor. Some medicines can make you feel dizzy. This can increase your chance of falling. Ask your doctor what other things that you can do to help prevent falls. This information is not intended to replace advice given to you by your health care provider. Make sure you discuss any questions you have with your health care provider. Document Released: 01/10/2009 Document  Revised: 08/22/2015 Document Reviewed: 04/20/2014 Elsevier Interactive Patient Education  [employer].

## 2016-12-18 ENCOUNTER — Ambulatory Visit (INDEPENDENT_AMBULATORY_CARE_PROVIDER_SITE_OTHER): Payer: Medicare Other | Admitting: Cardiovascular Disease

## 2016-12-18 ENCOUNTER — Encounter: Payer: Self-pay | Admitting: Cardiovascular Disease

## 2016-12-18 VITALS — BP 130/60 | HR 44 | Ht 64.0 in | Wt 171.2 lb

## 2016-12-18 DIAGNOSIS — I480 Paroxysmal atrial fibrillation: Secondary | ICD-10-CM | POA: Diagnosis not present

## 2016-12-18 DIAGNOSIS — E784 Other hyperlipidemia: Secondary | ICD-10-CM

## 2016-12-18 DIAGNOSIS — I10 Essential (primary) hypertension: Secondary | ICD-10-CM

## 2016-12-18 DIAGNOSIS — E7849 Other hyperlipidemia: Secondary | ICD-10-CM

## 2016-12-18 MED ORDER — CARVEDILOL 3.125 MG PO TABS: 3.1250 mg | ORAL_TABLET | Freq: Two times a day (BID) | ORAL | 3 refills | Status: DC

## 2016-12-18 NOTE — Patient Instructions (Signed)
Medication Instructions:  Your physician has recommended you make the following change in your medication:  STOP taking metoprolol START taking coreg 3.125mg  twice a day   Labwork: none  Testing/Procedures: none  Follow-Up: Your physician wants you to follow-up in: 6 months with Dr. Fletcher Anon.  You will receive a reminder letter in the mail two months in advance. If you don't receive a letter, please call our office to schedule the follow-up appointment.   Any Other Special Instructions Will Be Listed Below (If Applicable).     If you need a refill on your cardiac medications before your next appointment, please call your pharmacy.

## 2016-12-18 NOTE — Progress Notes (Signed)
Cardiology Office Note   Date:  12/18/2016   ID:  Amber Wolfe, DOB [birth year], MRN [id number]  PCP:  Amber Courser, MD  Cardiologist:   Amber Sacramento, MD   Chief Complaint  Patient presents with  . other    3 month follow up. Meds reviewed by the pt. verbally. "doing well."  Pt. c/o giving out with vacuming.       History of Present Illness: Amber Wolfe is a 83 y.o. female who presents for A follow-up visit regarding atrial flutter/fibrillation. She was seen in the past by Amber Wolfe. She had successful cardioversion in May 2017 and currently on long-term anticoagulation with Xarelto. She had recurrent atrial fibrillation in November 2017 and was placed on amiodarone. Other medical problems include diabetes mellitus, hypertension, hyperlipidemia and moderate mitral regurgitation. She had a nuclear stress test done in March 2017 which showed no evidence of ischemia or perfusion defects with normal ejection fraction. She had mildly reduced ejection fraction the setting of atrial fibrillation and tachycardia but most recent echocardiogram in May of this year showed an EF of 60-65%, mild to moderate mitral regurgitation, mildly dilated left atrium and mild pulmonary hypertension.  She has been doing well overall with no chest pain, shortness of breath or palpitations. She does complain of mild fatigue and dizziness. She is bradycardic.   Past Medical History:  Diagnosis Date  . Arthritis    In hands  . Atrial flutter ([place])    a. s/p successful TEE/DCCV 07/31/2015; b. on Xarelto; c. CHADS2VASc => 6 (CHF, HTN, age x 2, DM, female)  . Cataract   . Chronic systolic CHF (congestive heart failure) ([place])    a. echo 06/2015: EF 40%, basal HK, nl WM of mid and apical segments, mild to mod MR/TR; b. TEE 04/07/2991: nl LV systolic function, mild to mod MR, trivial TR  . Diabetes mellitus without complication ([place])    Type II  . GERD (gastroesophageal reflux disease)   . Glaucoma    Right  Eye  . Hyperlipidemia   . Hypertension   . Moderate mitral regurgitation    a. see echo from 06/2015 and TEE 07/2015  . PAF (paroxysmal atrial fibrillation) ([place])    a.  01/2016 Admitted to [facility] with PAF -->amio added.    Past Surgical History:  Procedure Laterality Date  . CATARACT EXTRACTION Bilateral   . CESAREAN SECTION    . ELECTROPHYSIOLOGIC STUDY N/A 07/31/2015   Procedure: CARDIOVERSION;  Surgeon: Amber Bushy, MD;  Location: [facility] ORS;  Service: Cardiovascular;  Laterality: N/A;  . ELECTROPHYSIOLOGIC STUDY N/A 04/03/2016   Procedure: CARDIOVERSION;  Surgeon: Amber Merritts, MD;  Location: [facility] ORS;  Service: Cardiovascular;  Laterality: N/A;  . TEE WITHOUT CARDIOVERSION N/A 07/31/2015   Procedure: TRANSESOPHAGEAL ECHOCARDIOGRAM (TEE);  Surgeon: Amber Bushy, MD;  Location: [facility] ORS;  Service: Cardiovascular;  Laterality: N/A;  . WISDOM TOOTH EXTRACTION    . Wrist Surgery Left      Current Outpatient Prescriptions  Medication Sig Dispense Refill  . acetaminophen (TYLENOL) 500 MG tablet Take 500-1,000 mg by mouth daily as needed for moderate pain or headache.    Marland Kitchen amiodarone (PACERONE) 200 MG tablet Take 1 tablet (200 mg total) by mouth daily. 30 tablet 3  . amLODipine (NORVASC) 5 MG tablet Take 1 tablet (5 mg total) by mouth daily. 30 tablet 6  . atorvastatin (LIPITOR) 20 MG tablet Take 1 tablet (20 mg total) by mouth at bedtime. 30 tablet  9  . Blood Pressure KIT Check blood pressure daily or as needed if tired or fatigued; dx I10 1 each 0  . furosemide (LASIX) 20 MG tablet Take 1 tablet (20 mg total) by mouth daily as needed. 30 tablet 3  . glimepiride (AMARYL) 1 MG tablet TAKE ONE (1) TABLET EACH DAY WITH BREAKFAST (NEW LOWER DOSE) 30 tablet 3  . latanoprost (XALATAN) 0.005 % ophthalmic solution Place 1 drop into both eyes at bedtime.     Marland Kitchen lisinopril (PRINIVIL,ZESTRIL) 20 MG tablet Take 1 tablet (20 mg total) by mouth daily. 90 tablet 3  . metoprolol tartrate (LOPRESSOR) 25 MG  tablet TAKE ONE TABLET TWICE DAILY 60 tablet 0  . rivaroxaban (XARELTO) 20 MG TABS tablet TAKE ONE (1) TABLET EACH DAY WITH SUPPER 30 tablet 3  . timolol (TIMOPTIC) 0.5 % ophthalmic solution Place 1 drop into both eyes at bedtime.      No current facility-administered medications for this visit.     Allergies:   Patient has no known allergies.    Social History:  The patient  reports that she has never smoked. She has never used smokeless tobacco. She reports that she does not drink alcohol or use drugs.   Family History:  The patient's family history includes Aneurysm in her daughter; Cataracts in her mother; Heart attack in her brother, father, maternal grandfather, mother, and paternal grandfather.    ROS:  Please see the history of present illness.   Otherwise, review of systems are positive for none.   All other systems are reviewed and negative.    PHYSICAL EXAM: VS:  BP 130/60 (BP Location: Left Arm, Patient Position: Sitting, Cuff Size: Normal)   Pulse (!) 44   Ht 5' 4"  (1.626 m)   Wt 171 lb 4 oz (77.7 kg)   BMI 29.39 kg/m  , BMI Body mass index is 29.39 kg/m. GEN: Well nourished, well developed, in no acute distress  HEENT: normal  Neck: no JVD, carotid bruits, or masses Cardiac: RRR; no  rubs, or gallops,no edema . 2/6 crescendo decrescendo systolic murmur in the aortic area which is early peaking Respiratory:  clear to auscultation bilaterally, normal work of breathing GI: soft, nontender, nondistended, + BS MS: no deformity or atrophy  Skin: warm and dry, no rash Neuro:  Strength and sensation are intact Psych: euthymic mood, full affect   EKG:  EKG is ordered today. The ekg ordered today demonstrates sinus bradycardia with first-degree AV block. LVH with mild repolarization abnormalities   Recent Labs: 06/15/2016: ALT 10; Hemoglobin 12.3; Platelets 185 09/28/2016: BUN 17; Creatinine, Ser 0.70; Potassium 3.7; Sodium 136; TSH 3.140    Lipid Panel    Component  Value Date/Time   CHOL 124 06/15/2016 1459   CHOL 159 05/15/2015 1619   TRIG 76 06/15/2016 1459   TRIG 124 05/15/2015 1619   HDL 55 06/15/2016 1459   CHOLHDL 2.3 06/15/2016 1459   VLDL 15 06/15/2016 1459   VLDL 25 05/15/2015 1619   LDLCALC 54 06/15/2016 1459      Wt Readings from Last 3 Encounters:  12/18/16 171 lb 4 oz (77.7 kg)  12/17/16 172 lb 8 oz (78.2 kg)  08/12/16 172 lb (78 kg)      No flowsheet data found.    ASSESSMENT AND PLAN:  1.  Paroxysmal atrial fibrillation: She is maintaining in sinus rhythm with amiodarone and tolerating long-term anticoagulation with Xarelto. She is mildly bradycardic with some symptoms related to that. I elected  to switch metoprolol to carvedilol 3.125 mg twice daily. I will consider decreasing the dose of amiodarone to 100 mg daily to minimize the risk of long-term toxicity. Labs were ordered by Dr. Sanda Klein yesterday but have not been performed. I encouraged the patient to get these labs done.  2. Essential hypertension: Blood pressure improved from before with the addition of lisinopril but she continues to have high readings. Hopefully the blood pressure would improve further with carvedilol.  3. Hyperlipidemia: Currently on atorvastatin with most recent LDL 54.  Disposition:   FU with me in 6 months  Signed,  Amber Sacramento, MD  12/18/2016 9:28 AM    [place]

## 2016-12-21 DIAGNOSIS — E784 Other hyperlipidemia: Secondary | ICD-10-CM | POA: Diagnosis not present

## 2016-12-21 DIAGNOSIS — R809 Proteinuria, unspecified: Secondary | ICD-10-CM | POA: Diagnosis not present

## 2016-12-21 DIAGNOSIS — E1140 Type 2 diabetes mellitus with diabetic neuropathy, unspecified: Secondary | ICD-10-CM | POA: Diagnosis not present

## 2016-12-21 DIAGNOSIS — R001 Bradycardia, unspecified: Secondary | ICD-10-CM | POA: Diagnosis not present

## 2016-12-22 LAB — CBC WITH DIFFERENTIAL/PLATELET
BASOS PCT: 0.8 %
Basophils Absolute: 42 cells/uL (ref 0–200)
EOS PCT: 4.3 %
Eosinophils Absolute: 224 cells/uL (ref 15–500)
HCT: 36.5 % (ref 35.0–45.0)
Hemoglobin: 12 g/dL (ref 11.7–15.5)
LYMPHS ABS: 1149 {cells}/uL (ref 850–3900)
MCH: 31.4 pg (ref 27.0–33.0)
MCHC: 32.9 g/dL (ref 32.0–36.0)
MCV: 95.5 fL (ref 80.0–100.0)
MPV: 12.1 fL (ref 7.5–12.5)
Monocytes Relative: 10.5 %
Neutro Abs: 3240 cells/uL (ref 1500–7800)
Neutrophils Relative %: 62.3 %
PLATELETS: 170 10*3/uL (ref 140–400)
RBC: 3.82 10*6/uL (ref 3.80–5.10)
RDW: 13 % (ref 11.0–15.0)
TOTAL LYMPHOCYTE: 22.1 %
WBC: 5.2 10*3/uL (ref 3.8–10.8)
WBCMIX: 546 {cells}/uL (ref 200–950)

## 2016-12-22 LAB — LIPID PANEL
CHOLESTEROL: 126 mg/dL (ref <200)
HDL: 55 mg/dL (ref >50)
LDL CHOLESTEROL (CALC): 58 mg/dL
Non-HDL Cholesterol (Calc): 71 mg/dL (calc) (ref <130)
TRIGLYCERIDES: 57 mg/dL (ref <150)
Total CHOL/HDL Ratio: 2.3 (calc) (ref <5.0)

## 2016-12-22 LAB — COMPLETE METABOLIC PANEL WITH GFR
AG Ratio: 1.4 (calc) (ref 1.0–2.5)
ALBUMIN MSPROF: 3.5 g/dL — AB (ref 3.6–5.1)
ALKALINE PHOSPHATASE (APISO): 79 U/L (ref 33–130)
ALT: 24 U/L (ref 6–29)
AST: 32 U/L (ref 10–35)
BILIRUBIN TOTAL: 0.4 mg/dL (ref 0.2–1.2)
BUN: 13 mg/dL (ref 7–25)
CHLORIDE: 109 mmol/L (ref 98–110)
CO2: 28 mmol/L (ref 20–32)
Calcium: 8.6 mg/dL (ref 8.6–10.4)
Creat: 0.75 mg/dL (ref 0.60–0.88)
GFR, Est African American: 85 mL/min/{1.73_m2} (ref >=60)
GFR, Est Non African American: 74 mL/min/{1.73_m2} (ref >=60)
GLOBULIN: 2.5 g/dL (ref 1.9–3.7)
Glucose, Bld: 140 mg/dL — ABNORMAL HIGH (ref 65–99)
Potassium: 4.2 mmol/L (ref 3.5–5.3)
SODIUM: 142 mmol/L (ref 135–146)
Total Protein: 6 g/dL — ABNORMAL LOW (ref 6.1–8.1)

## 2016-12-22 LAB — HEMOGLOBIN A1C
Hgb A1c MFr Bld: 6.1 % of total Hgb — ABNORMAL HIGH (ref <5.7)
Mean Plasma Glucose: 128 (calc)
eAG (mmol/L): 7.1 (calc)

## 2016-12-22 LAB — TSH: TSH: 3.82 m[IU]/L (ref 0.40–4.50)

## 2016-12-22 LAB — MICROALBUMIN / CREATININE URINE RATIO
CREATININE, URINE: 27 mg/dL (ref 20–275)
Microalb, Ur: <0.2 mg/dL

## 2016-12-28 ENCOUNTER — Other Ambulatory Visit: Payer: Self-pay | Admitting: *Deleted

## 2016-12-28 MED ORDER — FUROSEMIDE 20 MG PO TABS: 20.0000 mg | ORAL_TABLET | Freq: Every day | ORAL | 3 refills | Status: DC | PRN

## 2017-01-04 DIAGNOSIS — H401131 Primary open-angle glaucoma, bilateral, mild stage: Secondary | ICD-10-CM | POA: Diagnosis not present

## 2017-01-16 ENCOUNTER — Other Ambulatory Visit: Payer: Self-pay | Admitting: Family Medicine

## 2017-01-18 NOTE — Telephone Encounter (Signed)
I'm going to defer the ACE-I (lisinopril) to her cardiologist; they are adjusting this, so please have her get this from cardiology from now on; thank you

## 2017-01-18 NOTE — Telephone Encounter (Signed)
Patient notified

## 2017-01-19 ENCOUNTER — Telehealth: Payer: Self-pay | Admitting: *Deleted

## 2017-01-19 NOTE — Telephone Encounter (Signed)
Copied from [place] [id number]. Topic: Quick Communication - See Telephone Encounter >> Jan 19, 2017  7:36 AM Aurelio Brash B wrote: CRM for notification. See Telephone encounter for: 01/19/17. Pt asking for refill lisinipril

## 2017-01-19 NOTE — Telephone Encounter (Signed)
Patient is requesting refill- please review for refill.

## 2017-01-19 NOTE — Telephone Encounter (Signed)
[facility] called - he is follow ing up on medication refill.

## 2017-01-20 ENCOUNTER — Other Ambulatory Visit: Payer: Self-pay | Admitting: Family Medicine

## 2017-01-20 ENCOUNTER — Other Ambulatory Visit: Payer: Self-pay

## 2017-01-20 MED ORDER — LISINOPRIL 20 MG PO TABS: 20.0000 mg | ORAL_TABLET | Freq: Every day | ORAL | 3 refills | Status: DC

## 2017-01-20 NOTE — Telephone Encounter (Signed)
Patient notified will contact cardio

## 2017-01-20 NOTE — Telephone Encounter (Signed)
Please ask patient to get this (lisinopril) from her cardiologist Only one of [place] should ideally be prescribing and monitoring this Thank you

## 2017-01-21 ENCOUNTER — Other Ambulatory Visit: Payer: Self-pay | Admitting: Family Medicine

## 2017-01-21 NOTE — Telephone Encounter (Signed)
Again, I'd like the patient to get this prescribed by her cardiologist I will approve a few to get her through so there is no interruption in medicine Please contact the pharmacy and ask them to send Rx to her cardiologist Thank you

## 2017-01-22 ENCOUNTER — Other Ambulatory Visit: Payer: Self-pay | Admitting: Cardiovascular Disease

## 2017-01-22 ENCOUNTER — Telehealth: Payer: Self-pay | Admitting: Family Medicine

## 2017-01-22 ENCOUNTER — Other Ambulatory Visit: Payer: Self-pay

## 2017-01-22 NOTE — Telephone Encounter (Signed)
Called patient back notified her since she see's a specialist cardio, Dr. Sanda Klein would like them to take over those meds since they are dealing with there specialty the heart.  I told patient that I had contacted the pharmacy to send refills to them.

## 2017-01-22 NOTE — Telephone Encounter (Signed)
Patients PCP would like [place] to start refilling Xarelto 20MG . Patient had an appointment 12/18/2016 with a follow up in 6 months. Would it be okay to refill this under [place]?

## 2017-01-22 NOTE — Telephone Encounter (Signed)
Copied from [employer] #[id number]. Topic: Quick Communication - See Telephone Encounter >> Jan 22, 2017 10:23 AM Neva Seat wrote: Reason for CRM:   Pt says Dr. Sanda Klein stopped: blood pressure medication (Lisinoprial)   Pharmacy and Dr. Sanda Klein  wouldn't fill Xarelto Pt wants to know why and is out of both medications

## 2017-01-22 NOTE — Telephone Encounter (Signed)
Copied from [place] #[id number]. Topic: General - Other >> Jan 18, 2017  4:02 PM [name], Helene Kelp D wrote: Patient called would like her medication Lisinopril refill and send to her pharmacy, thanks.

## 2017-01-22 NOTE — Telephone Encounter (Signed)
Patient and pharmacy notified

## 2017-01-22 NOTE — Telephone Encounter (Signed)
This has already been taken care of patient will call and get from now on through cardiologist.

## 2017-01-25 ENCOUNTER — Telehealth: Payer: Self-pay | Admitting: Cardiovascular Disease

## 2017-01-25 NOTE — Telephone Encounter (Signed)
Nurse calling from [facility] stating pt is in HF program  Just needs a call back needing to know patient last HR and BP reading  Please call

## 2017-01-26 NOTE — Telephone Encounter (Signed)
Returned call, no answer but secure voicemail. Left message with last documented BP and HR from office visit with Dr Fletcher Anon on 12/18/16: 130/60, HR 44. Advised RN to call us back if she has further questions.

## 2017-02-15 ENCOUNTER — Other Ambulatory Visit: Payer: Self-pay | Admitting: Family Medicine

## 2017-02-19 ENCOUNTER — Other Ambulatory Visit: Payer: Self-pay | Admitting: Cardiovascular Disease

## 2017-03-02 ENCOUNTER — Ambulatory Visit: Payer: Medicare Other

## 2017-04-10 DIAGNOSIS — J029 Acute pharyngitis, unspecified: Secondary | ICD-10-CM | POA: Diagnosis not present

## 2017-04-14 ENCOUNTER — Encounter: Payer: Medicare Other | Admitting: Family Medicine

## 2017-04-22 ENCOUNTER — Other Ambulatory Visit: Payer: Self-pay | Admitting: Family Medicine

## 2017-05-04 ENCOUNTER — Encounter: Payer: Medicare Other | Admitting: Family Medicine

## 2017-05-06 ENCOUNTER — Other Ambulatory Visit: Payer: Self-pay | Admitting: Cardiovascular Disease

## 2017-05-21 ENCOUNTER — Other Ambulatory Visit: Payer: Self-pay | Admitting: Family Medicine

## 2017-05-21 ENCOUNTER — Other Ambulatory Visit: Payer: Self-pay | Admitting: Cardiovascular Disease

## 2017-05-24 ENCOUNTER — Telehealth: Payer: Self-pay | Admitting: Cardiovascular Disease

## 2017-05-24 NOTE — Telephone Encounter (Signed)
Pt called  She is scheduled to see Thurmond Butts on 06/24/17

## 2017-05-24 NOTE — Progress Notes (Signed)
Cardiology Office Note Date:  05/27/2017  Patient ID:  Amber Wolfe [birth year], MRN [id number] PCP:  Arnetha Courser, MD  Cardiologist:  Dr. Fletcher Anon, MD    Chief Complaint: Follow up  History of Present Illness: Amber Wolfe is a 84 y.o. female with history of Afib/flutter on Xarelto s/p successful TEE/DCCV in 07/2015 with recurrence of Afib in 01/2016 and was placed on amiodarone with restoration of sinus rhythm following repeat DCCV on 04/03/2016. She also has history of moderate mitral regurgitation, DM, HTN, and HLD who presents for follow up of Afib.   She was previously followed by Dr. Yvone Neu. Nuclear stress test done in 05/2015 that showed no evidence of ischemia or perfusion defects with normal EF. She had a mildly reduced EF in the setting of Afib with RVR by echo in 06/2015, that showed an EF of 40%, though this improved by echo in 07/2016, following restoration of sinus rhythm with an EF of 60-65%, mildly dilated left atrium, and mild to moderate pulmonary hypertension with a PASP of 40-45 mmHg. She has more recently established with Dr. Fletcher Anon, last being seen in 11/2016, for follow up and was doing well at that time outside of fatigue. She was noted to be bradycardic with a heart rate of 44 bpm. Her metoprolol was changed to Coreg 3.125 mg bid.   Labs from 11/2016 showed CBC unremarkable, TSH normal, K+ 4.2, SCr 0.75, A1c 6.1, LDL 58.   She comes in doing well today.  She is accompanied by a family member.  She denies any chest pain, palpitations, nausea, vomiting, diaphoresis, dizziness, presyncope, or syncope.  She does report mild exertional shortness of breath if she has to walk to the backside of [employer].  No associated chest pain, palpitations, or dizziness.  Over the past 12 months she has had approximately 6 mechanical falls.  Never with loss of consciousness or head trauma.  She plans on picking up a 4 pronged cane.  No melena or BRBPR.  Compliant with all medications.  She does  not check her blood pressure or heart rate at home.  The patient and family member feel like the patient is doing well.  She does not have any concerns at this time.   Past Medical History:  Diagnosis Date  . Arthritis    In hands  . Atrial flutter ([place])    a. s/p successful TEE/DCCV 07/31/2015; b. on Xarelto; c. CHADS2VASc => 6 (CHF, HTN, age x 2, DM, female)  . Cataract   . Chronic systolic CHF (congestive heart failure) ([place])    a. echo 06/2015: EF 40%, basal HK, nl WM of mid and apical segments, mild to mod MR/TR; b. TEE 06/03/6281: nl LV systolic function, mild to mod MR, trivial TR  . Diabetes mellitus without complication ([place])    Type II  . GERD (gastroesophageal reflux disease)   . Glaucoma    Right Eye  . Hyperlipidemia   . Hypertension   . Moderate mitral regurgitation    a. see echo from 06/2015 and TEE 07/2015  . PAF (paroxysmal atrial fibrillation) ([place])    a.  01/2016 Admitted to [facility] with PAF -->amio added.    Past Surgical History:  Procedure Laterality Date  . CATARACT EXTRACTION Bilateral   . CESAREAN SECTION    . ELECTROPHYSIOLOGIC STUDY N/A 07/31/2015   Procedure: CARDIOVERSION;  Surgeon: Wende Bushy, MD;  Location: [facility] ORS;  Service: Cardiovascular;  Laterality: N/A;  . ELECTROPHYSIOLOGIC STUDY N/A 04/03/2016  Procedure: CARDIOVERSION;  Surgeon: Minna Merritts, MD;  Location: [facility] ORS;  Service: Cardiovascular;  Laterality: N/A;  . TEE WITHOUT CARDIOVERSION N/A 07/31/2015   Procedure: TRANSESOPHAGEAL ECHOCARDIOGRAM (TEE);  Surgeon: Wende Bushy, MD;  Location: [facility] ORS;  Service: Cardiovascular;  Laterality: N/A;  . WISDOM TOOTH EXTRACTION    . Wrist Surgery Left     Current Meds  Medication Sig  . acetaminophen (TYLENOL) 500 MG tablet Take 500-1,000 mg by mouth daily as needed for moderate pain or headache.  Marland Kitchen amiodarone (PACERONE) 200 MG tablet Take 1 tablet (200 mg total) by mouth daily.  Marland Kitchen amLODipine (NORVASC) 5 MG tablet Take 1 tablet (5 mg total) by  mouth daily.  Marland Kitchen atorvastatin (LIPITOR) 20 MG tablet Take 1 tablet (20 mg total) by mouth at bedtime.  . Blood Pressure KIT Check blood pressure daily or as needed if tired or fatigued; dx I10  . furosemide (LASIX) 20 MG tablet Take 1 tablet (20 mg total) by mouth daily as needed.  Marland Kitchen glimepiride (AMARYL) 1 MG tablet TAKE ONE (1) TABLET EACH DAY WITH BREAKFAST (NEW LOWER DOSE)  . latanoprost (XALATAN) 0.005 % ophthalmic solution Place 1 drop into both eyes at bedtime.   . timolol (TIMOPTIC) 0.5 % ophthalmic solution Place 1 drop into both eyes at bedtime.   Amber Wolfe 20 MG TABS tablet TAKE ONE (1) TABLET EACH DAY WITH SUPPER    Allergies:   Patient has no known allergies.   Social History:  The patient  reports that  has never smoked. she has never used smokeless tobacco. She reports that she does not drink alcohol or use drugs.   Family History:  The patient's family history includes Aneurysm in her daughter; Cataracts in her mother; Heart attack in her brother, father, maternal grandfather, mother, and paternal grandfather.  ROS:   Review of Systems  Constitutional: Positive for malaise/fatigue. Negative for chills, diaphoresis, fever and weight loss.  HENT: Negative for congestion.   Eyes: Negative for discharge and redness.  Respiratory: Positive for shortness of breath. Negative for cough, hemoptysis, sputum production and wheezing.   Cardiovascular: Negative for chest pain, palpitations, orthopnea, claudication, leg swelling and PND.  Gastrointestinal: Negative for abdominal pain, blood in stool, heartburn, melena, nausea and vomiting.  Genitourinary: Negative for hematuria.  Musculoskeletal: Negative for falls and myalgias.  Skin: Negative for rash.  Neurological: Negative for dizziness, tingling, tremors, sensory change, speech change, focal weakness, loss of consciousness and weakness.  Endo/Heme/Allergies: Does not bruise/bleed easily.  Psychiatric/Behavioral: Negative for  substance abuse. The patient is not nervous/anxious.   All other systems reviewed and are negative.    PHYSICAL EXAM:  VS:  BP (!) 152/60 (BP Location: Left Arm, Patient Position: Sitting, Cuff Size: Normal)   Pulse (!) 45   Ht _0  (1.676 m)   Wt 171 lb (77.6 kg)   BMI 27.60 kg/m  BMI: Body mass index is 27.6 kg/m.  Physical Exam  Constitutional: She is oriented to person, place, and time. She appears well-developed and well-nourished.  HENT:  Head: Normocephalic and atraumatic.  Eyes: Right eye exhibits no discharge. Left eye exhibits no discharge.  Neck: Normal range of motion. No JVD present.  Cardiovascular: Regular rhythm, S1 normal, S2 normal and normal heart sounds. Bradycardia present. Exam reveals no distant heart sounds, no friction rub, no midsystolic click and no opening snap.  No murmur heard. Pulses:      Posterior tibial pulses are 2+ on the right side, and 2+  on the left side.  Pulmonary/Chest: Effort normal and breath sounds normal. No respiratory distress. She has no decreased breath sounds. She has no wheezes. She has no rales. She exhibits no tenderness.  Abdominal: Soft. She exhibits no distension. There is no tenderness.  Musculoskeletal: She exhibits no edema.  Neurological: She is alert and oriented to person, place, and time.  Skin: Skin is warm and dry. No cyanosis. Nails show no clubbing.  Psychiatric: She has a normal mood and affect. Her speech is normal and behavior is normal. Judgment and thought content normal.     EKG:  Was ordered and interpreted by me today. Shows sinus bradycardia 45 bpm, left axis deviation, 1st degree AV block, LVH, no acute st/t changes  Recent Labs: 12/21/2016: ALT 24; BUN 13; Creat 0.75; Hemoglobin 12.0; Platelets 170; Potassium 4.2; Sodium 142; TSH 3.82  06/15/2016: LDL Cholesterol 54; VLDL 15 12/21/2016: Cholesterol 126; HDL 55; Total CHOL/HDL Ratio 2.3; Triglycerides 57   CrCl cannot be calculated (Patient's most  recent lab result is older than the maximum 21 days allowed.).   Wt Readings from Last 3 Encounters:  05/27/17 171 lb (77.6 kg)  12/18/16 171 lb 4 oz (77.7 kg)  12/17/16 172 lb 8 oz (78.2 kg)     Other studies reviewed: Additional studies/records reviewed today include: summarized above  ASSESSMENT AND PLAN:  1. PAF: Currently in sinus rhythm with a bradycardic heart rate.  Stop Coreg given bradycardia. Continue amidoarone 200 mg daily. CHADS2VASc at least 6 (CHF, HTN, age x 2, DM, female). Continue Xarelto 20 mg q dinner. CrCl 69.59 mL/min based on cmet from 11/2016 and weight from today.  2. Pulmonary hypertension: Check echo. Continue Lasix 20 mg daily. If PASP is worsening consider adding Revatio.    3. Bradycardia: Stop Coreg. Check BP/HR at echo visit. If HR remains bradycardic at that time taper amiodarone to 100 mg daily and assess for appropriate chronotropic response. Asymptomatic. Recent TSH normal and potassium at goal.   4. High risk medication: TSH normal 11/2016. LFT normal 11/2016. QTc as above. She follows with ophthalmology regularly already.   5. HTN: Blood pressure remains elevated today in the 176H systolic as above. Coreg held today given her bradycardia. Increase amlodipine to 10 mg daily. Continue Lasix 20 mg daily along with lisinopril 20 mg daily.   6. HLD: Lipitor 20 mg daily. LDL 58 in 11/2016. LFT normal in 11/2016.   7. Mechanical falls: Advised patient to pick up a cane. No head trauma.   Disposition: F/u with me in 1 month.   Current medicines are reviewed at length with the patient today.  The patient did not have any concerns regarding medicines.  Signed, Christell Faith, PA-C 05/27/2017 8:37 AM     [facility] [address] [place] [address] [place], [place] [address] [phone]

## 2017-05-24 NOTE — Telephone Encounter (Signed)
-----   Message from [place] sent at 05/21/2017  4:42 PM EST ----- Can you try to schedule an appointment with Dr. Fletcher Anon, Thank you!

## 2017-05-27 ENCOUNTER — Ambulatory Visit: Payer: Medicare Other | Admitting: Physician Assistant

## 2017-05-27 ENCOUNTER — Encounter: Payer: Self-pay | Admitting: Physician Assistant

## 2017-05-27 VITALS — BP 152/60 | HR 45 | Ht 66.0 in | Wt 171.0 lb

## 2017-05-27 DIAGNOSIS — I480 Paroxysmal atrial fibrillation: Secondary | ICD-10-CM

## 2017-05-27 DIAGNOSIS — R001 Bradycardia, unspecified: Secondary | ICD-10-CM

## 2017-05-27 DIAGNOSIS — I272 Pulmonary hypertension, unspecified: Secondary | ICD-10-CM

## 2017-05-27 DIAGNOSIS — I10 Essential (primary) hypertension: Secondary | ICD-10-CM

## 2017-05-27 DIAGNOSIS — W19XXXD Unspecified fall, subsequent encounter: Secondary | ICD-10-CM | POA: Diagnosis not present

## 2017-05-27 DIAGNOSIS — E785 Hyperlipidemia, unspecified: Secondary | ICD-10-CM

## 2017-05-27 MED ORDER — AMLODIPINE BESYLATE 10 MG PO TABS: 10.0000 mg | ORAL_TABLET | Freq: Every day | ORAL | 3 refills | Status: DC

## 2017-05-27 NOTE — Patient Instructions (Signed)
Medication Instructions:  Your physician has recommended you make the following change in your medication:  1- STOP Carvedilol. 2- INCREASE Amlodipine to 10 mg by mouth once a day.   Labwork: none  Testing/Procedures: Your physician has requested that you have an echocardiogram. Echocardiography is a painless test that uses sound waves to create images of your heart. It provides your doctor with information about the size and shape of your heart and how well your heart's chambers and valves are working. This procedure takes approximately one hour. There are no restrictions for this procedure.    Follow-Up: Your physician recommends that you schedule a follow-up appointment in: [place].   If you need a refill on your cardiac medications before your next appointment, please call your pharmacy.   Echocardiogram An echocardiogram, or echocardiography, uses sound waves (ultrasound) to produce an image of your heart. The echocardiogram is simple, painless, obtained within a short period of time, and offers valuable information to your health care provider. The images from an echocardiogram can provide information such as:  Evidence of coronary artery disease (CAD).  Heart size.  Heart muscle function.  Heart valve function.  Aneurysm detection.  Evidence of a past heart attack.  Fluid buildup around the heart.  Heart muscle thickening.  Assess heart valve function.  Tell a health care provider about:  Any allergies you have.  All medicines you are taking, including vitamins, herbs, eye drops, creams, and over-the-counter medicines.  Any problems you or family members have had with anesthetic medicines.  Any blood disorders you have.  Any surgeries you have had.  Any medical conditions you have.  Whether you are pregnant or may be pregnant. What happens before the procedure? No special preparation is needed. Eat and drink normally. What happens during the  procedure?  In order to produce an image of your heart, gel will be applied to your chest and a wand-like tool (transducer) will be moved over your chest. The gel will help transmit the sound waves from the transducer. The sound waves will harmlessly bounce off your heart to allow the heart images to be captured in real-time motion. These images will then be recorded.  You may need an IV to receive a medicine that improves the quality of the pictures. What happens after the procedure? You may return to your normal schedule including diet, activities, and medicines, unless your health care provider tells you otherwise. This information is not intended to replace advice given to you by your health care provider. Make sure you discuss any questions you have with your health care provider. Document Released: 03/13/2000 Document Revised: 11/02/2015 Document Reviewed: 11/21/2012 Elsevier Interactive Patient Education  2017 [employer].

## 2017-06-16 ENCOUNTER — Other Ambulatory Visit: Payer: Self-pay | Admitting: Family Medicine

## 2017-06-16 ENCOUNTER — Other Ambulatory Visit: Payer: Self-pay | Admitting: Physician Assistant

## 2017-06-16 DIAGNOSIS — I480 Paroxysmal atrial fibrillation: Secondary | ICD-10-CM

## 2017-06-17 ENCOUNTER — Ambulatory Visit (INDEPENDENT_AMBULATORY_CARE_PROVIDER_SITE_OTHER): Payer: Medicare Other | Admitting: Family Medicine

## 2017-06-17 ENCOUNTER — Encounter: Payer: Self-pay | Admitting: Family Medicine

## 2017-06-17 VITALS — BP 130/70 | HR 77 | Temp 98.0°F | Resp 14 | Wt 170.4 lb

## 2017-06-17 DIAGNOSIS — Z5181 Encounter for therapeutic drug level monitoring: Secondary | ICD-10-CM

## 2017-06-17 DIAGNOSIS — I10 Essential (primary) hypertension: Secondary | ICD-10-CM

## 2017-06-17 DIAGNOSIS — M810 Age-related osteoporosis without current pathological fracture: Secondary | ICD-10-CM | POA: Diagnosis not present

## 2017-06-17 DIAGNOSIS — S92524A Nondisplaced fracture of medial phalanx of right lesser toe(s), initial encounter for closed fracture: Secondary | ICD-10-CM | POA: Diagnosis not present

## 2017-06-17 DIAGNOSIS — R809 Proteinuria, unspecified: Secondary | ICD-10-CM | POA: Diagnosis not present

## 2017-06-17 DIAGNOSIS — E1140 Type 2 diabetes mellitus with diabetic neuropathy, unspecified: Secondary | ICD-10-CM | POA: Diagnosis not present

## 2017-06-17 MED ORDER — ALENDRONATE SODIUM 70 MG PO TABS: 70.0000 mg | ORAL_TABLET | ORAL | 11 refills | Status: DC

## 2017-06-17 NOTE — Assessment & Plan Note (Signed)
Check labs 

## 2017-06-17 NOTE — Assessment & Plan Note (Signed)
Well-controlled, try to limit sodium

## 2017-06-17 NOTE — Progress Notes (Signed)
BP 130/70   Pulse 77   Temp 98 F (36.7 C) (Oral)   Resp 14   Wt 170 lb 6.4 oz (77.3 kg)   SpO2 94%   BMI 27.50 kg/m    Subjective:    Patient ID: Amber Wolfe, female    DOB: January 02, 1932, 84 y.o.   MRN: [id number]  HPI: Amber Wolfe is a 84 y.o. female  Chief Complaint  Patient presents with  . Follow-up    HPI Patient is here for f/u Stubbed her middle toe RIGHT foot on Sunday; bruised and swollen; looks much better now that it did; didn't hurt much at first; does not want xrays, doesn't want to do anything; goes barefoot in the house  Type 2 diabetes mellitus; doing okay she thinks; not checking FSBS; really cut down on sugary drinks; does drink Sprite and cranberry; drinks black coffee; does drink sweet tea; no white bread Lab Results  Component Value Date   HGBA1C 6.1 (H) 12/21/2016    HTN; well-controlled here today; used to check BP but it broke; adds salt to food only seldomly, just with a boiled egg  She is going back to Dr. Fletcher Anon; beating too slow, and he adjusted her medicines; no chest pain, no trouble breathing; not feeling irregular rhythm  Osteoporosis; never taken anything for bones; getting greens; does not drink milk; eats walnuts  High cholesterol; brother died of a heart attack; eats [place] bacon instead of regular bacon; pack of hamburgers, just once a week Lab Results  Component Value Date   CHOL 126 12/21/2016   HDL 55 12/21/2016   LDLCALC 58 12/21/2016   TRIG 57 12/21/2016   CHOLHDL 2.3 12/21/2016    Depression screen PHQ 2/9 06/17/2017 12/17/2016 06/15/2016 03/24/2016 09/19/2015  Decreased Interest 0 0 0 0 0  Down, Depressed, Hopeless 0 0 0 0 0  PHQ - 2 Score 0 0 0 0 0  Altered sleeping - - - - -  Tired, decreased energy - - - - -  Change in appetite - - - - -  Feeling bad or failure about yourself  - - - - -  Trouble concentrating - - - - -  Moving slowly or fidgety/restless - - - - -  Suicidal thoughts - - - - -  PHQ-9 Score - - - -  -  Difficult doing work/chores - - - - -    Relevant past medical, surgical, family and social history reviewed Past Medical History:  Diagnosis Date  . Arthritis    In hands  . Atrial flutter ([place])    a. s/p successful TEE/DCCV 07/31/2015; b. on Xarelto; c. CHADS2VASc => 6 (CHF, HTN, age x 2, DM, female)  . Cataract   . Chronic systolic CHF (congestive heart failure) ([place])    a. echo 06/2015: EF 40%, basal HK, nl WM of mid and apical segments, mild to mod MR/TR; b. TEE 06/01/1935: nl LV systolic function, mild to mod MR, trivial TR  . Diabetes mellitus without complication ([place])    Type II  . GERD (gastroesophageal reflux disease)   . Glaucoma    Right Eye  . Hyperlipidemia   . Hypertension   . Moderate mitral regurgitation    a. see echo from 06/2015 and TEE 07/2015  . PAF (paroxysmal atrial fibrillation) ([place])    a.  01/2016 Admitted to [facility] with PAF -->amio added.   Past Surgical History:  Procedure Laterality Date  . CATARACT EXTRACTION Bilateral   .  CESAREAN SECTION    . ELECTROPHYSIOLOGIC STUDY N/A 07/31/2015   Procedure: CARDIOVERSION;  Surgeon: Wende Bushy, MD;  Location: [facility] ORS;  Service: Cardiovascular;  Laterality: N/A;  . ELECTROPHYSIOLOGIC STUDY N/A 04/03/2016   Procedure: CARDIOVERSION;  Surgeon: Minna Merritts, MD;  Location: [facility] ORS;  Service: Cardiovascular;  Laterality: N/A;  . TEE WITHOUT CARDIOVERSION N/A 07/31/2015   Procedure: TRANSESOPHAGEAL ECHOCARDIOGRAM (TEE);  Surgeon: Wende Bushy, MD;  Location: [facility] ORS;  Service: Cardiovascular;  Laterality: N/A;  . WISDOM TOOTH EXTRACTION    . Wrist Surgery Left    Family History  Problem Relation Age of Onset  . Cataracts Mother   . Heart attack Mother   . Heart attack Father   . Heart attack Brother   . Aneurysm Daughter   . Heart attack Maternal Grandfather   . Heart attack Paternal Grandfather    Social History   Tobacco Use  . Smoking status: Never Smoker  . Smokeless tobacco: Never Used  Substance  Use Topics  . Alcohol use: No  . Drug use: No    Interim medical history since last visit reviewed. Allergies and medications reviewed  Review of Systems Per HPI unless specifically indicated above     Objective:    BP 130/70   Pulse 77   Temp 98 F (36.7 C) (Oral)   Resp 14   Wt 170 lb 6.4 oz (77.3 kg)   SpO2 94%   BMI 27.50 kg/m   Wt Readings from Last 3 Encounters:  06/17/17 170 lb 6.4 oz (77.3 kg)  05/27/17 171 lb (77.6 kg)  12/18/16 171 lb 4 oz (77.7 kg)    Physical Exam  Constitutional: She appears well-developed and well-nourished. No distress.  HENT:  Head: Normocephalic and atraumatic.  Eyes: EOM are normal. No scleral icterus.  Neck: No thyromegaly present.  Cardiovascular: Normal rate, regular rhythm and normal heart sounds.  No extrasystoles are present.  No murmur heard. Sounds to be in sinus brady today  Pulmonary/Chest: Effort normal and breath sounds normal. No respiratory distress. She has no wheezes.  Abdominal: Soft. Bowel sounds are normal. She exhibits no distension.  Musculoskeletal: She exhibits no edema.       Left shoulder: She exhibits decreased range of motion.       Thoracic back: She exhibits deformity (thoracic kyphosis).       Right foot: There is decreased range of motion, tenderness, swelling and deformity.  Swelling and discoloration of the third toe right foot with bruising proximally  Neurological: She is alert. She exhibits normal muscle tone.  Skin: Skin is warm and dry. She is not diaphoretic. No pallor.  Psychiatric: She has a normal mood and affect. Her behavior is normal. Judgment and thought content normal. Her mood appears not anxious. She does not exhibit a depressed mood.    Results for orders placed or performed in visit on 12/17/16  Microalbumin / creatinine urine ratio  Result Value Ref Range   Creatinine, Urine 27 20 - 275 mg/dL   Microalb, Ur <0.2 mg/dL   Microalb Creat Ratio NOTE <30 mcg/mg creat  Lipid panel    Result Value Ref Range   Cholesterol 126 <200 mg/dL   HDL 55 >50 mg/dL   Triglycerides 57 <150 mg/dL   LDL Cholesterol (Calc) 58 mg/dL (calc)   Total CHOL/HDL Ratio 2.3 <5.0 (calc)   Non-HDL Cholesterol (Calc) 71 <130 mg/dL (calc)  Hemoglobin A1c  Result Value Ref Range   Hgb A1c MFr Bld 6.1 (  H) <5.7 % of total Hgb   Mean Plasma Glucose 128 (calc)   eAG (mmol/L) 7.1 (calc)  COMPLETE METABOLIC PANEL WITH GFR  Result Value Ref Range   Glucose, Bld 140 (H) 65 - 99 mg/dL   BUN 13 7 - 25 mg/dL   Creat 0.75 0.60 - 0.88 mg/dL   GFR, Est Non African American 74 > OR = 60 mL/min/1.74m2   GFR, Est African American 85 > OR = 60 mL/min/1.86m2   BUN/Creatinine Ratio NOT APPLICABLE 6 - 22 (calc)   Sodium 142 135 - 146 mmol/L   Potassium 4.2 3.5 - 5.3 mmol/L   Chloride 109 98 - 110 mmol/L   CO2 28 20 - 32 mmol/L   Calcium 8.6 8.6 - 10.4 mg/dL   Total Protein 6.0 (L) 6.1 - 8.1 g/dL   Albumin 3.5 (L) 3.6 - 5.1 g/dL   Globulin 2.5 1.9 - 3.7 g/dL (calc)   AG Ratio 1.4 1.0 - 2.5 (calc)   Total Bilirubin 0.4 0.2 - 1.2 mg/dL   Alkaline phosphatase (APISO) 79 33 - 130 U/L   AST 32 10 - 35 U/L   ALT 24 6 - 29 U/L  TSH  Result Value Ref Range   TSH 3.82 0.40 - 4.50 mIU/L  CBC with Differential/Platelet  Result Value Ref Range   WBC 5.2 3.8 - 10.8 Thousand/uL   RBC 3.82 3.80 - 5.10 Million/uL   Hemoglobin 12.0 11.7 - 15.5 g/dL   HCT 36.5 35.0 - 45.0 %   MCV 95.5 80.0 - 100.0 fL   MCH 31.4 27.0 - 33.0 pg   MCHC 32.9 32.0 - 36.0 g/dL   RDW 13.0 11.0 - 15.0 %   Platelets 170 140 - 400 Thousand/uL   MPV 12.1 7.5 - 12.5 fL   Neutro Abs 3,240 1,500 - 7,800 cells/uL   Lymphs Abs 1,149 850 - 3,900 cells/uL   WBC mixed population 546 200 - 950 cells/uL   Eosinophils Absolute 224 15 - 500 cells/uL   Basophils Absolute 42 0 - 200 cells/uL   Neutrophils Relative % 62.3 %   Total Lymphocyte 22.1 %   Monocytes Relative 10.5 %   Eosinophils Relative 4.3 %   Basophils Relative 0.8 %       Assessment & Plan:   Problem List Items Addressed This Visit      Cardiovascular and Mediastinum   Essential hypertension, benign (Chronic)    Well-controlled, try to limit sodium        Endocrine   Diabetes mellitus with neuropathy (HCC) - Primary (Chronic)    Foot exam by MD, check A1c        Musculoskeletal and Integument   Osteoporosis    Start fosamax; 3 servings of calcium daily or supplement, vit D 1000 iu daily      Relevant Medications   alendronate (FOSAMAX) 70 MG tablet     Other   Microalbuminuria    Check urine       Other Visit Diagnoses    Closed nondisplaced fracture of middle phalanx of lesser toe of right foot, initial encounter           Follow up plan: Return in about 6 months (around 12/27/2017) for follow-up visit with Dr. Sanda Klein.  An after-visit summary was printed and given to the patient at [facility].  Please see the patient instructions which may contain other information and recommendations beyond what is mentioned above in the assessment and plan.  Meds ordered this encounter  Medications  . alendronate (FOSAMAX) 70 MG tablet    Sig: Take 1 tablet (70 mg total) by mouth every 7 (seven) days. Take with a full glass of water on an empty stomach.    Dispense:  4 tablet    Refill:  11    No orders of the defined types were placed in this encounter.

## 2017-06-17 NOTE — Patient Instructions (Addendum)
Start the new medicine for your bones Get three servings of calcium a day or supplement to get 1200 mg of calcium total each day Get 1000 iu of vitamin D3 daily Return for labs next week If you have not heard anything from my staff in a week about any orders/referrals/studies from today, please contact us here to follow-up [phone]  Diabetes and Foot Care Diabetes may cause you to have problems because of poor blood supply (circulation) to your feet and legs. This may cause the skin on your feet to become thinner, break easier, and heal more slowly. Your skin may become dry, and the skin may peel and crack. You may also have nerve damage in your legs and feet causing decreased feeling in them. You may not notice minor injuries to your feet that could lead to infections or more serious problems. Taking care of your feet is one of the most important things you can do for yourself. Follow these instructions at home:  Wear shoes at all times, even in the house. Do not go barefoot. Bare feet are easily injured.  Check your feet daily for blisters, cuts, and redness. If you cannot see the bottom of your feet, use a mirror or ask someone for help.  Wash your feet with warm water (do not use hot water) and mild soap. Then pat your feet and the areas between your toes until they are completely dry. Do not soak your feet as this can dry your skin.  Apply a moisturizing lotion or petroleum jelly (that does not contain alcohol and is unscented) to the skin on your feet and to dry, brittle toenails. Do not apply lotion between your toes.  Trim your toenails straight across. Do not dig under them or around the cuticle. File the edges of your nails with an emery board or nail file.  Do not cut corns or calluses or try to remove them with medicine.  Wear clean socks or stockings every day. Make sure they are not too tight. Do not wear knee-high stockings since they may decrease blood flow to your  legs.  Wear shoes that fit properly and have enough cushioning. To break in new shoes, wear them for just a few hours a day. This prevents you from injuring your feet. Always look in your shoes before you put them on to be sure there are no objects inside.  Do not cross your legs. This may decrease the blood flow to your feet.  If you find a minor scrape, cut, or break in the skin on your feet, keep it and the skin around it clean and dry. These areas may be cleansed with mild soap and water. Do not cleanse the area with peroxide, alcohol, or iodine.  When you remove an adhesive bandage, be sure not to damage the skin around it.  If you have a wound, look at it several times a day to make sure it is healing.  Do not use heating pads or hot water bottles. They may burn your skin. If you have lost feeling in your feet or legs, you may not know it is happening until it is too late.  Make sure your health care provider performs a complete foot exam at least annually or more often if you have foot problems. Report any cuts, sores, or bruises to your health care provider immediately. Contact a health care provider if:  You have an injury that is not healing.  You have cuts or  breaks in the skin.  You have an ingrown nail.  You notice redness on your legs or feet.  You feel burning or tingling in your legs or feet.  You have pain or cramps in your legs and feet.  Your legs or feet are numb.  Your feet always feel cold. Get help right away if:  There is increasing redness, swelling, or pain in or around a wound.  There is a red line that goes up your leg.  Pus is coming from a wound.  You develop a fever or as directed by your health care provider.  You notice a bad smell coming from an ulcer or wound. This information is not intended to replace advice given to you by your health care provider. Make sure you discuss any questions you have with your health care provider. Document  Released: 03/13/2000 Document Revised: 08/22/2015 Document Reviewed: 08/23/2012 Elsevier Interactive Patient Education  2017 [place] Prevention in the Home Falls can cause injuries. They can happen to people of all ages. There are many things you can do to make your home safe and to help prevent falls. What can I do on the outside of my home?  Regularly fix the edges of walkways and driveways and fix any cracks.  Remove anything that might make you trip as you walk through a door, such as a raised step or threshold.  Trim any bushes or trees on the path to your home.  Use bright outdoor lighting.  Clear any walking paths of anything that might make someone trip, such as rocks or tools.  Regularly check to see if handrails are loose or broken. Make sure that both sides of any steps have handrails.  Any raised decks and porches should have guardrails on the edges.  Have any leaves, snow, or ice cleared regularly.  Use sand or salt on walking paths during winter.  Clean up any spills in your garage right away. This includes oil or grease spills. What can I do in the bathroom?  Use night lights.  Install grab bars by the toilet and in the tub and shower. Do not use towel bars as grab bars.  Use non-skid mats or decals in the tub or shower.  If you need to sit down in the shower, use a plastic, non-slip stool.  Keep the floor dry. Clean up any water that spills on the floor as soon as it happens.  Remove soap buildup in the tub or shower regularly.  Attach bath mats securely with double-sided non-slip rug tape.  Do not have throw rugs and other things on the floor that can make you trip. What can I do in the bedroom?  Use night lights.  Make sure that you have a light by your bed that is easy to reach.  Do not use any sheets or blankets that are too big for your bed. They should not hang down onto the floor.  Have a firm chair that has side arms. You can use  this for support while you get dressed.  Do not have throw rugs and other things on the floor that can make you trip. What can I do in the kitchen?  Clean up any spills right away.  Avoid walking on wet floors.  Keep items that you use a lot in easy-to-reach places.  If you need to reach something above you, use a strong step stool that has a grab bar.  Keep electrical cords out of  the way.  Do not use floor polish or wax that makes floors slippery. If you must use wax, use non-skid floor wax.  Do not have throw rugs and other things on the floor that can make you trip. What can I do with my stairs?  Do not leave any items on the stairs.  Make sure that there are handrails on both sides of the stairs and use them. Fix handrails that are broken or loose. Make sure that handrails are as long as the stairways.  Check any carpeting to make sure that it is firmly attached to the stairs. Fix any carpet that is loose or worn.  Avoid having throw rugs at the top or bottom of the stairs. If you do have throw rugs, attach them to the floor with carpet tape.  Make sure that you have a light switch at the top of the stairs and the bottom of the stairs. If you do not have them, ask someone to add them for you. What else can I do to help prevent falls?  Wear shoes that: ? Do not have high heels. ? Have rubber bottoms. ? Are comfortable and fit you well. ? Are closed at the toe. Do not wear sandals.  If you use a stepladder: ? Make sure that it is fully opened. Do not climb a closed stepladder. ? Make sure that both sides of the stepladder are locked into place. ? Ask someone to hold it for you, if possible.  Clearly mark and make sure that you can see: ? Any grab bars or handrails. ? First and last steps. ? Where the edge of each step is.  Use tools that help you move around (mobility aids) if they are needed. These include: ? Canes. ? Walkers. ? Scooters. ? Crutches.  Turn on  the lights when you go into a dark area. Replace any light bulbs as soon as they burn out.  Set up your furniture so you have a clear path. Avoid moving your furniture around.  If any of your floors are uneven, fix them.  If there are any pets around you, be aware of where they are.  Review your medicines with your doctor. Some medicines can make you feel dizzy. This can increase your chance of falling. Ask your doctor what other things that you can do to help prevent falls. This information is not intended to replace advice given to you by your health care provider. Make sure you discuss any questions you have with your health care provider. Document Released: 01/10/2009 Document Revised: 08/22/2015 Document Reviewed: 04/20/2014 Elsevier Interactive Patient Education  [employer].

## 2017-06-17 NOTE — Assessment & Plan Note (Signed)
Check urine.

## 2017-06-17 NOTE — Assessment & Plan Note (Signed)
Start fosamax; 3 servings of calcium daily or supplement, vit D 1000 iu daily

## 2017-06-17 NOTE — Assessment & Plan Note (Signed)
Foot exam by MD, check A1c

## 2017-06-19 ENCOUNTER — Other Ambulatory Visit: Payer: Self-pay | Admitting: Family Medicine

## 2017-06-21 ENCOUNTER — Other Ambulatory Visit: Payer: Self-pay

## 2017-06-21 DIAGNOSIS — R809 Proteinuria, unspecified: Secondary | ICD-10-CM | POA: Diagnosis not present

## 2017-06-21 DIAGNOSIS — E1140 Type 2 diabetes mellitus with diabetic neuropathy, unspecified: Secondary | ICD-10-CM

## 2017-06-21 DIAGNOSIS — Z5181 Encounter for therapeutic drug level monitoring: Secondary | ICD-10-CM | POA: Diagnosis not present

## 2017-06-22 LAB — COMPLETE METABOLIC PANEL WITH GFR
AG Ratio: 1.5 (calc) (ref 1.0–2.5)
ALT: 15 U/L (ref 6–29)
AST: 19 U/L (ref 10–35)
Albumin: 4 g/dL (ref 3.6–5.1)
Alkaline phosphatase (APISO): 81 U/L (ref 33–130)
BUN: 15 mg/dL (ref 7–25)
CO2: 30 mmol/L (ref 20–32)
CREATININE: 0.76 mg/dL (ref 0.60–0.88)
Calcium: 9.1 mg/dL (ref 8.6–10.4)
Chloride: 109 mmol/L (ref 98–110)
GFR, Est African American: 84 mL/min/{1.73_m2} (ref >=60)
GFR, Est Non African American: 73 mL/min/{1.73_m2} (ref >=60)
GLUCOSE: 69 mg/dL (ref 65–139)
Globulin: 2.6 g/dL (calc) (ref 1.9–3.7)
Potassium: 3.8 mmol/L (ref 3.5–5.3)
Sodium: 143 mmol/L (ref 135–146)
Total Bilirubin: 0.7 mg/dL (ref 0.2–1.2)
Total Protein: 6.6 g/dL (ref 6.1–8.1)

## 2017-06-22 LAB — HEMOGLOBIN A1C
HEMOGLOBIN A1C: 6.2 %{Hb} — AB (ref <5.7)
MEAN PLASMA GLUCOSE: 131 (calc)
eAG (mmol/L): 7.3 (calc)

## 2017-06-22 LAB — LIPID PANEL
CHOL/HDL RATIO: 2.4 (calc) (ref <5.0)
CHOLESTEROL: 132 mg/dL (ref <200)
HDL: 55 mg/dL (ref >50)
LDL CHOLESTEROL (CALC): 59 mg/dL
Non-HDL Cholesterol (Calc): 77 mg/dL (calc) (ref <130)
Triglycerides: 97 mg/dL (ref <150)

## 2017-06-22 LAB — MICROALBUMIN / CREATININE URINE RATIO
Creatinine, Urine: 50 mg/dL (ref 20–275)
MICROALB UR: 0.5 mg/dL
Microalb Creat Ratio: 10 mcg/mg creat (ref <30)

## 2017-06-23 ENCOUNTER — Other Ambulatory Visit: Payer: Self-pay | Admitting: Family Medicine

## 2017-06-23 NOTE — Progress Notes (Signed)
Stop amaryl

## 2017-06-29 ENCOUNTER — Other Ambulatory Visit: Payer: Self-pay

## 2017-06-29 ENCOUNTER — Ambulatory Visit (INDEPENDENT_AMBULATORY_CARE_PROVIDER_SITE_OTHER): Payer: Medicare Other

## 2017-06-29 DIAGNOSIS — I480 Paroxysmal atrial fibrillation: Secondary | ICD-10-CM

## 2017-07-03 NOTE — Progress Notes (Signed)
Cardiology Office Note Date:  07/06/2017  Patient ID:  Amber Wolfe, Amber Wolfe 1931-04-03, MRN [id number] PCP:  Arnetha Courser, MD  Cardiologist:  Dr. Fletcher Anon, MD    Chief Complaint: Follow up  History of Present Illness: Amber Wolfe is a 84 y.o. female with history of Afib/flutter on Xarelto s/p successful TEE/DCCV in 07/2015 with recurrence of Afib in 01/2016 and was placed on amiodarone with restoration of sinus rhythm following repeat DCCV on 04/03/2016. She also has history of pulmonary hypertension, chronic diastolic CHF, moderate mitral regurgitation, DM, HTN, and HLD who presents for follow up.   She was previously followed by Dr. Yvone Neu. Nuclear stress test done in 05/2015 that showed no evidence of ischemia or perfusion defects with normal EF. She had a mildly reduced EF in the setting of Afib with RVR by echo in 06/2015, that showed an EF of 40%, though this improved by echo in 07/2016, following restoration of sinus rhythm with an EF of 60-65%, mildly dilated left atrium, and mild to moderate pulmonary hypertension with a PASP of 40-45 mmHg. She has more recently established with Dr. Fletcher Anon, last being seen by him in 11/2016, for follow up and was doing well at that time outside of fatigue. She was noted to be bradycardic with a heart rate of 44 bpm. Her metoprolol was changed to Coreg 3.125 mg bid. She was seen in the office 05/27/17 for follow up and was doing well. She reported mild exertional SOB with walking to the back of [employer]. She was noted to be bradycardic at that time with a heart rate of 45 bpm. Her Coreg was held and she was continued on amiodarone 200 mg daily as well as Xarelto. She underwent echo on 06/29/17 that showed EF 55-60%, mild LVH, Gr2DD, trivial AI, mild MR, moderately dilated left atrium measuring 47 mm, mildly dilated right atrium, mild to moderate TR, PASP 35-40 mmHG, trivial pericardial effusion noted posterior to the heart.   Labs from 11/2016 showed normal TSH. CMET from  05/2017 showed normal LFT/SCr, K+ 3.8, LDL 59, A1c 6.2. She follows up with ophthalmology regularly.   She comes in doing well today.  Her shortness of breath is improving.  She did stub her toe between a step and her vacuum cleaner leading to fractures of the second and third toes on the right foot since she was last seen.  Conservative management has been undertaken with improvement in pain.  No falls, melena, BRBPR, or hematemesis.  She is not checking her blood pressure or heart rate at home.  Compliant with all medications.  She does not have any concerns at this time.   Past Medical History:  Diagnosis Date  . Arthritis    In hands  . Atrial flutter ([place])    a. s/p successful TEE/DCCV 07/31/2015; b. on Xarelto; c. CHADS2VASc => 6 (CHF, HTN, age x 2, DM, female)  . Cataract   . Chronic systolic CHF (congestive heart failure) ([place])    a. echo 06/2015: EF 40%, basal HK, nl WM of mid and apical segments, mild to mod MR/TR; b. TEE 12/04/2834: nl LV systolic function, mild to mod MR, trivial TR  . Diabetes mellitus without complication ([place])    Type II  . GERD (gastroesophageal reflux disease)   . Glaucoma    Right Eye  . Hyperlipidemia   . Hypertension   . Moderate mitral regurgitation    a. see echo from 06/2015 and TEE 07/2015  . PAF (  paroxysmal atrial fibrillation) ([place])    a.  01/2016 Admitted to [facility] with PAF -->amio added.    Past Surgical History:  Procedure Laterality Date  . CATARACT EXTRACTION Bilateral   . CESAREAN SECTION    . ELECTROPHYSIOLOGIC STUDY N/A 07/31/2015   Procedure: CARDIOVERSION;  Surgeon: Wende Bushy, MD;  Location: [facility] ORS;  Service: Cardiovascular;  Laterality: N/A;  . ELECTROPHYSIOLOGIC STUDY N/A 04/03/2016   Procedure: CARDIOVERSION;  Surgeon: Minna Merritts, MD;  Location: [facility] ORS;  Service: Cardiovascular;  Laterality: N/A;  . TEE WITHOUT CARDIOVERSION N/A 07/31/2015   Procedure: TRANSESOPHAGEAL ECHOCARDIOGRAM (TEE);  Surgeon: Wende Bushy, MD;  Location:  [facility] ORS;  Service: Cardiovascular;  Laterality: N/A;  . WISDOM TOOTH EXTRACTION    . Wrist Surgery Left     Current Meds  Medication Sig  . acetaminophen (TYLENOL) 500 MG tablet Take 500-1,000 mg by mouth daily as needed for moderate pain or headache.  . alendronate (FOSAMAX) 70 MG tablet Take 1 tablet (70 mg total) by mouth every 7 (seven) days. Take with a full glass of water on an empty stomach.  [place] Kitchen amiodarone (PACERONE) 200 MG tablet Take 1 tablet (200 mg total) by mouth daily.  [place] Kitchen amLODipine (NORVASC) 10 MG tablet Take 1 tablet (10 mg total) by mouth daily.  [place] Kitchen atorvastatin (LIPITOR) 20 MG tablet Take 1 tablet (20 mg total) by mouth at bedtime.  . Blood Pressure KIT Check blood pressure daily or as needed if tired or fatigued; dx I10  . furosemide (LASIX) 20 MG tablet Take 1 tablet (20 mg total) by mouth daily as needed.  . latanoprost (XALATAN) 0.005 % ophthalmic solution Place 1 drop into both eyes at bedtime.   . timolol (TIMOPTIC) 0.5 % ophthalmic solution Place 1 drop into both eyes at bedtime.   Alveda Reasons 20 MG TABS tablet TAKE ONE (1) TABLET EACH DAY WITH SUPPER    Allergies:   Patient has no known allergies.   Social History:  The patient  reports that she has never smoked. She has never used smokeless tobacco. She reports that she does not drink alcohol or use drugs.   Family History:  The patient's family history includes Aneurysm in her daughter; Cataracts in her mother; Heart attack in her brother, father, maternal grandfather, mother, and paternal grandfather.  ROS:   Review of Systems  Constitutional: Positive for malaise/fatigue. Negative for chills, diaphoresis, fever and weight loss.  HENT: Negative for congestion.   Eyes: Negative for discharge and redness.  Respiratory: Negative for cough, hemoptysis, sputum production, shortness of breath and wheezing.   Cardiovascular: Negative for chest pain, palpitations, orthopnea, claudication, leg swelling and PND.    Gastrointestinal: Negative for abdominal pain, blood in stool, heartburn, melena, nausea and vomiting.  Genitourinary: Negative for hematuria.  Musculoskeletal: Positive for joint pain. Negative for falls and myalgias.  Skin: Negative for rash.  Neurological: Negative for dizziness, tingling, tremors, sensory change, speech change, focal weakness, loss of consciousness and weakness.  Endo/Heme/Allergies: Does not bruise/bleed easily.  Psychiatric/Behavioral: Negative for substance abuse. The patient is not nervous/anxious.   All other systems reviewed and are negative.    PHYSICAL EXAM:  VS:  BP (!) 144/64 (BP Location: Left Arm, Patient Position: Sitting, Cuff Size: Normal)   Pulse (!) 54   Ht 5' 6"  (1.676 m)   Wt 170 lb 12 oz (77.5 kg)   BMI 27.56 kg/m  BMI: Body mass index is 27.56 kg/m.  Physical Exam  Constitutional: She is oriented  to person, place, and time. She appears well-developed and well-nourished.  HENT:  Head: Normocephalic and atraumatic.  Eyes: Right eye exhibits no discharge. Left eye exhibits no discharge.  Neck: Normal range of motion. No JVD present.  Cardiovascular: Regular rhythm, S1 normal and S2 normal. Bradycardia present. Exam reveals no distant heart sounds, no friction rub, no midsystolic click and no opening snap.  Murmur heard. Pulses:      Dorsalis pedis pulses are 2+ on the right side, and 2+ on the left side.       Posterior tibial pulses are 2+ on the right side, and 2+ on the left side.  1/6 systolic murmur noted in the right upper sternal border  Pulmonary/Chest: Effort normal and breath sounds normal. No respiratory distress. She has no decreased breath sounds. She has no wheezes. She has no rales. She exhibits no tenderness.  Abdominal: Soft. She exhibits no distension. There is no tenderness.  Musculoskeletal: She exhibits no edema.  Mild swelling of the right second and third toes without significant ecchymosis  Neurological: She is alert  and oriented to person, place, and time.  Skin: Skin is warm and dry. No cyanosis. Nails show no clubbing.  Psychiatric: She has a normal mood and affect. Her speech is normal and behavior is normal. Judgment and thought content normal.     EKG:  Was ordered and interpreted by me today. Shows sinus bradycardia, 54 bpm, first-degree AV block, LVH with early repolarization, poor R wave progression, no acute ST-T changes  Recent Labs: 12/21/2016: Hemoglobin 12.0; Platelets 170; TSH 3.82 06/21/2017: ALT 15; BUN 15; Creat 0.76; Potassium 3.8; Sodium 143  06/21/2017: Cholesterol 132; HDL 55; LDL Cholesterol (Calc) 59; Total CHOL/HDL Ratio 2.4; Triglycerides 97   Estimated Creatinine Clearance: 56 mL/min (by C-G formula based on SCr of 0.76 mg/dL).   Wt Readings from Last 3 Encounters:  07/06/17 170 lb 12 oz (77.5 kg)  06/17/17 170 lb 6.4 oz (77.3 kg)  05/27/17 171 lb (77.6 kg)     Other studies reviewed: Additional studies/records reviewed today include: summarized above  ASSESSMENT AND PLAN:  1. PAF: Maintaining sinus rhythm with a mildly bradycardic heart rate.  Decrease amiodarone to 100 mg daily.  CHADS2VASc at least 6 (CHF, HTN, age x 2, DM, female). Continue Xarelto 20 mg q dinner.   2. Chronic diastolic CHF/pulmonary hypertension: She does not appear grossly volume overloaded at this time.  Recent echocardiogram in 05/2017 showed improving pulmonary arterial pressure.  Continue Lasix 20 mg daily.  3. Bradycardia: Remains mildly bradycardic, though improved.  Asymptomatic.  Amiodarone decreased as above.  Demonstrates appropriate chronotropic response.  Not on beta-blocker therapy secondary to bradycardia.  4. HTN: Blood pressure is improving though still suboptimally controlled.  Increase lisinopril to 40 mg daily.  Continue amlodipine 10 mg daily as well as Lasix 20 mg daily.  5. High risk medication: Recent LFT and TSH normal.  She follows up with ophthalmology regularly.  6. HLD:  Lipitor 20 mg daily.  LDL of 58 in 11/2016.  LFT normal in 05/2017.  Disposition: F/u with myself or Dr. Fletcher Anon in 3 months.  Current medicines are reviewed at length with the patient today.  The patient did not have any concerns regarding medicines.  Signed, Christell Faith, PA-C 07/06/2017 1:08 PM     [address] [place],  [address] [phone]

## 2017-07-05 DIAGNOSIS — H401131 Primary open-angle glaucoma, bilateral, mild stage: Secondary | ICD-10-CM | POA: Diagnosis not present

## 2017-07-06 ENCOUNTER — Encounter: Payer: Self-pay | Admitting: Physician Assistant

## 2017-07-06 ENCOUNTER — Ambulatory Visit: Payer: Medicare Other | Admitting: Physician Assistant

## 2017-07-06 VITALS — BP 144/64 | HR 54 | Ht 66.0 in | Wt 170.8 lb

## 2017-07-06 DIAGNOSIS — R001 Bradycardia, unspecified: Secondary | ICD-10-CM

## 2017-07-06 DIAGNOSIS — E7849 Other hyperlipidemia: Secondary | ICD-10-CM

## 2017-07-06 DIAGNOSIS — Z79899 Other long term (current) drug therapy: Secondary | ICD-10-CM

## 2017-07-06 DIAGNOSIS — I5032 Chronic diastolic (congestive) heart failure: Secondary | ICD-10-CM

## 2017-07-06 DIAGNOSIS — I272 Pulmonary hypertension, unspecified: Secondary | ICD-10-CM | POA: Diagnosis not present

## 2017-07-06 DIAGNOSIS — I480 Paroxysmal atrial fibrillation: Secondary | ICD-10-CM

## 2017-07-06 MED ORDER — LISINOPRIL 40 MG PO TABS: 40.0000 mg | ORAL_TABLET | Freq: Every day | ORAL | 3 refills | Status: DC

## 2017-07-06 MED ORDER — AMIODARONE HCL 200 MG PO TABS: 100.0000 mg | ORAL_TABLET | Freq: Every day | ORAL | Status: DC

## 2017-07-06 NOTE — Patient Instructions (Signed)
Medication Instructions: - Your physician has recommended you make the following change in your medication:   1) DECREASE amiodarone to 200 mg- take 1/2 tablet (100 mg) by mouth once daily 2) INCREASE lisinopril to 40 mg- take 1 tablet (40 mg) by mouth once daily  Labwork: - none ordered  Procedures/Testing: - none ordered  Follow-Up: - Your physician recommends that you schedule a follow-up appointment in: 3 months with Dr. Hillery Hunter, PA   Any Additional Special Instructions Will Be Listed Below (If Applicable).     If you need a refill on your cardiac medications before your next appointment, please call your pharmacy.

## 2017-07-12 DIAGNOSIS — H401131 Primary open-angle glaucoma, bilateral, mild stage: Secondary | ICD-10-CM | POA: Diagnosis not present

## 2017-07-13 ENCOUNTER — Encounter: Payer: Self-pay | Admitting: Family Medicine

## 2017-07-17 ENCOUNTER — Other Ambulatory Visit: Payer: Self-pay | Admitting: Family Medicine

## 2017-07-17 NOTE — Telephone Encounter (Signed)
Sgpt and lipids reviewed; Rx approved 

## 2017-08-06 ENCOUNTER — Other Ambulatory Visit: Payer: Self-pay

## 2017-08-06 NOTE — Patient Outreach (Signed)
[facility]) Care Management  08/06/2017  JALINE PINCOCK [birth year] [id number]   Medication Adherence call to Mrs. Johnell Comings patient is showing past due under [facility] Ins.on Glimepiride 1 mg spoke with patient she is no longer taking this medication doctor Sanda Klein has discontinued it at this time.   [facility] Management Direct Dial [phone]  Fax [phone] [email or web address]

## 2017-08-12 ENCOUNTER — Emergency Department: Payer: Medicare Other

## 2017-08-12 ENCOUNTER — Emergency Department
Admission: EM | Admit: 2017-08-12 | Discharge: 2017-08-12 | Disposition: A | Discharge status: Home / Self Care | Payer: Medicare Other | Attending: Emergency Medicine | Admitting: Emergency Medicine

## 2017-08-12 DIAGNOSIS — Y999 Unspecified external cause status: Secondary | ICD-10-CM | POA: Insufficient documentation

## 2017-08-12 DIAGNOSIS — I5022 Chronic systolic (congestive) heart failure: Secondary | ICD-10-CM | POA: Diagnosis not present

## 2017-08-12 DIAGNOSIS — E119 Type 2 diabetes mellitus without complications: Secondary | ICD-10-CM | POA: Insufficient documentation

## 2017-08-12 DIAGNOSIS — Z7901 Long term (current) use of anticoagulants: Secondary | ICD-10-CM | POA: Diagnosis not present

## 2017-08-12 DIAGNOSIS — S8001XA Contusion of right knee, initial encounter: Secondary | ICD-10-CM | POA: Insufficient documentation

## 2017-08-12 DIAGNOSIS — M25562 Pain in left knee: Secondary | ICD-10-CM | POA: Diagnosis not present

## 2017-08-12 DIAGNOSIS — S8000XA Contusion of unspecified knee, initial encounter: Secondary | ICD-10-CM

## 2017-08-12 DIAGNOSIS — S0083XA Contusion of other part of head, initial encounter: Secondary | ICD-10-CM | POA: Diagnosis not present

## 2017-08-12 DIAGNOSIS — R51 Headache: Secondary | ICD-10-CM | POA: Insufficient documentation

## 2017-08-12 DIAGNOSIS — S025XXA Fracture of tooth (traumatic), initial encounter for closed fracture: Secondary | ICD-10-CM | POA: Diagnosis not present

## 2017-08-12 DIAGNOSIS — S022XXA Fracture of nasal bones, initial encounter for closed fracture: Secondary | ICD-10-CM | POA: Diagnosis not present

## 2017-08-12 DIAGNOSIS — S01511A Laceration without foreign body of lip, initial encounter: Secondary | ICD-10-CM

## 2017-08-12 DIAGNOSIS — Z79899 Other long term (current) drug therapy: Secondary | ICD-10-CM | POA: Insufficient documentation

## 2017-08-12 DIAGNOSIS — W19XXXA Unspecified fall, initial encounter: Secondary | ICD-10-CM

## 2017-08-12 DIAGNOSIS — W010XXA Fall on same level from slipping, tripping and stumbling without subsequent striking against object, initial encounter: Secondary | ICD-10-CM | POA: Insufficient documentation

## 2017-08-12 DIAGNOSIS — M25561 Pain in right knee: Secondary | ICD-10-CM | POA: Diagnosis not present

## 2017-08-12 DIAGNOSIS — Y92008 Other place in unspecified non-institutional (private) residence as the place of occurrence of the external cause: Secondary | ICD-10-CM | POA: Diagnosis not present

## 2017-08-12 DIAGNOSIS — I110 Hypertensive heart disease with heart failure: Secondary | ICD-10-CM | POA: Insufficient documentation

## 2017-08-12 DIAGNOSIS — S0993XA Unspecified injury of face, initial encounter: Secondary | ICD-10-CM | POA: Diagnosis not present

## 2017-08-12 DIAGNOSIS — S199XXA Unspecified injury of neck, initial encounter: Secondary | ICD-10-CM | POA: Diagnosis not present

## 2017-08-12 DIAGNOSIS — S0990XA Unspecified injury of head, initial encounter: Secondary | ICD-10-CM | POA: Diagnosis not present

## 2017-08-12 DIAGNOSIS — Y9389 Activity, other specified: Secondary | ICD-10-CM | POA: Diagnosis not present

## 2017-08-12 DIAGNOSIS — S8002XA Contusion of left knee, initial encounter: Secondary | ICD-10-CM | POA: Diagnosis not present

## 2017-08-12 DIAGNOSIS — S8992XA Unspecified injury of left lower leg, initial encounter: Secondary | ICD-10-CM | POA: Diagnosis not present

## 2017-08-12 MED ORDER — ACETAMINOPHEN 500 MG PO TABS
1000.0000 mg | ORAL_TABLET | Freq: Once | ORAL | Status: AC
  Administered 2017-08-12: 1000 mg via ORAL
  Filled 2017-08-12: qty 2

## 2017-08-12 MED ORDER — TRAMADOL HCL 50 MG PO TABS: ORAL_TABLET | ORAL | 0 refills | Status: DC

## 2017-08-12 NOTE — ED Triage Notes (Signed)
Patient reports mechanical fall. Patient lost balance and fell onto face onto the deck while leaning forward. Patient c/o facial pain, mouth pain, and bilateral knee pain. Patient has bruising to face, missing tooth, and abrasions to bilateral knees. Patient is on  Xarelto.

## 2017-08-12 NOTE — ED Provider Notes (Signed)
[facility] Emergency Department Provider Note  ____________________________________________   First MD Initiated Contact with Patient 08/12/17 2253     (approximate)  I have reviewed the triage vital signs and the nursing notes.   HISTORY  Chief Complaint Fall    HPI Amber Wolfe is a 84 y.o. female who takes Xarelto for chronic a flutter who presents for evaluation of multiple injuries and contusion secondary to mechanical fall.  She states that she was out on her deck and was leaning forward with something in her hands, lost balance, and landed on the deck with her face and both knees.  She has moderate pain in her face, mouth, and both knees.  She is able to bear weight and ambulate in spite of the pain in her knees.  She has significant bruising to her face as well as some obvious dental injuries and a relatively superficial lip laceration.  She has had a mild generalized headache.  She has no focal numbness or weakness in any of her extremities.  She denies chest pain, shortness of breath, nausea, vomiting, and abdominal pain.  Moving her face around and touching it makes it worse, nothing in particular makes it better.  She does report some chronic dental issues and says that she does not go to a dentist regularly.   Past Medical History:  Diagnosis Date  . Arthritis    In hands  . Atrial flutter ([place])    a. s/p successful TEE/DCCV 07/31/2015; b. on Xarelto; c. CHADS2VASc => 6 (CHF, HTN, age x 2, DM, female)  . Cataract   . Chronic systolic CHF (congestive heart failure) ([place])    a. echo 06/2015: EF 40%, basal HK, nl WM of mid and apical segments, mild to mod MR/TR; b. TEE 06/04/3816: nl LV systolic function, mild to mod MR, trivial TR  . Diabetes mellitus without complication ([place])    Type II  . GERD (gastroesophageal reflux disease)   . Glaucoma    Right Eye  . Hyperlipidemia   . Hypertension   . Moderate mitral regurgitation    a. see echo from  06/2015 and TEE 07/2015  . PAF (paroxysmal atrial fibrillation) ([place])    a.  01/2016 Admitted to [facility] with PAF -->amio added.    Patient Active Problem List   Diagnosis Date Noted  . Osteoporosis 12/17/2016  . Bradycardia 12/17/2016  . Microalbuminuria 06/17/2016  . Typical atrial flutter ([place])   . SOB (shortness of breath)   . Palpitations 02/17/2016  . Mitral regurgitation 02/17/2016  . Left calcaneal bursitis 09/19/2015  . Atypical atrial flutter ([place]) 06/18/2015  . Hyperlipidemia 06/18/2015  . Primary osteoarthritis of right knee 05/20/2015  . Breast cancer screening 05/20/2015  . Diabetes mellitus with neuropathy ([place]) 05/15/2015  . Essential hypertension, benign 05/15/2015  . Medication monitoring encounter 05/15/2015  . Osteoarthritis of both hands 05/15/2015  . Decreased hearing of both ears 05/15/2015  . Atrial fibrillation ([place]) 05/15/2015  . Fracture of distal radius and ulna, left, closed, initial encounter 04/25/2014    Past Surgical History:  Procedure Laterality Date  . CATARACT EXTRACTION Bilateral   . CESAREAN SECTION    . ELECTROPHYSIOLOGIC STUDY N/A 07/31/2015   Procedure: CARDIOVERSION;  Surgeon: Wende Bushy, MD;  Location: [facility] ORS;  Service: Cardiovascular;  Laterality: N/A;  . ELECTROPHYSIOLOGIC STUDY N/A 04/03/2016   Procedure: CARDIOVERSION;  Surgeon: Minna Merritts, MD;  Location: [facility] ORS;  Service: Cardiovascular;  Laterality: N/A;  . TEE WITHOUT  CARDIOVERSION N/A 07/31/2015   Procedure: TRANSESOPHAGEAL ECHOCARDIOGRAM (TEE);  Surgeon: Wende Bushy, MD;  Location: [facility] ORS;  Service: Cardiovascular;  Laterality: N/A;  . WISDOM TOOTH EXTRACTION    . Wrist Surgery Left     Prior to Admission medications   Medication Sig Start Date End Date Taking? Authorizing Provider  acetaminophen (TYLENOL) 500 MG tablet Take 500-1,000 mg by mouth daily as needed for moderate pain or headache.    [provider]  alendronate (FOSAMAX) 70 MG tablet Take 1  tablet (70 mg total) by mouth every 7 (seven) days. Take with a full glass of water on an empty stomach. 06/17/17   Lada, Satira Anis, MD  amiodarone (PACERONE) 200 MG tablet Take 0.5 tablets (100 mg total) by mouth daily. 07/06/17   Dunn, Areta Haber, PA-C  amLODipine (NORVASC) 10 MG tablet Take 1 tablet (10 mg total) by mouth daily. 05/27/17 08/25/17  Rise Mu, PA-C  atorvastatin (LIPITOR) 20 MG tablet TAKE 1 TABLET BY MOUTH AT BEDTIME 07/17/17   Arnetha Courser, MD  Blood Pressure KIT Check blood pressure daily or as needed if tired or fatigued; dx I10 12/17/16   Lada, Satira Anis, MD  furosemide (LASIX) 20 MG tablet Take 1 tablet (20 mg total) by mouth daily as needed. 05/07/17   Wellington Hampshire, MD  latanoprost (XALATAN) 0.005 % ophthalmic solution Place 1 drop into both eyes at bedtime.     [provider]  lisinopril (PRINIVIL,ZESTRIL) 40 MG tablet Take 1 tablet (40 mg total) by mouth daily. 07/06/17 10/04/17  Rise Mu, PA-C  timolol (TIMOPTIC) 0.5 % ophthalmic solution Place 1 drop into both eyes at bedtime.  02/11/15   [provider]  traMADol (ULTRAM) 50 MG tablet Take 1-2 tablets by mouth every 6 hours as needed for moderate to severe pain 08/12/17   Hinda Kehr, MD  XARELTO 20 MG TABS tablet TAKE ONE (1) TABLET EACH DAY WITH SUPPER 01/22/17   Wellington Hampshire, MD    Allergies Patient has no known allergies.  Family History  Problem Relation Age of Onset  . Cataracts Mother   . Heart attack Mother   . Heart attack Father   . Heart attack Brother   . Aneurysm Daughter   . Heart attack Maternal Grandfather   . Heart attack Paternal Grandfather     Social History Social History   Tobacco Use  . Smoking status: Never Smoker  . Smokeless tobacco: Never Used  Substance Use Topics  . Alcohol use: No  . Drug use: No    Review of Systems Constitutional: No fever/chills Eyes: No visual changes. ENT: Swelling and pain Cardiovascular: Denies chest pain. Respiratory:  Denies shortness of breath. Gastrointestinal: No abdominal pain.  No nausea, no vomiting.  No diarrhea.  No constipation. Genitourinary: Negative for dysuria. Musculoskeletal: Negative for neck pain.  Negative for back pain. Integumentary: Negative for rash. Neurological: Negative for headaches, focal weakness or numbness.   ____________________________________________   PHYSICAL EXAM:  VITAL SIGNS: ED Triage Vitals  Enc Vitals Group     BP 08/12/17 2214 (!) 154/51     Pulse Rate 08/12/17 2214 (!) 50     Resp 08/12/17 2214 17     Temp 08/12/17 2214 (!) 97.5 F (36.4 C)     Temp Source 08/12/17 2214 Oral     SpO2 08/12/17 2214 99 %     Weight 08/12/17 2215 73 kg (161 lb)     Height 08/12/17 2215  1.676 m (5' 6" )     Head Circumference --      Peak Flow --      Pain Score 08/12/17 2214 9     Pain Loc --      Pain Edu? --      Excl. in [place]? --     Constitutional: Alert and oriented. Obvious facial trauma but no acute distress. Eyes: Conjunctivae are normal. PERRL. EOMI. Head: Extensive facial contusions/bruising Nose: Bruising and swelling, no gross deformity.  No active epistaxis, some dried blood in left naris Mouth/Throat: Mucous membranes are moist.  Dried blood in mouth.  <1-cm superficial laceration to mid-lower lip (mucosa, not across vermillion border), no palpable or visible foreign bodies.  Multiple anterior lower dental fractures, but in the setting of extensive chronic tooth decay.  No loose teeth. Neck: No stridor.  No meningeal signs.  No cervical spine tenderness to palpation. Cardiovascular: Normal rate, regular rhythm. Good peripheral circulation. Grossly normal heart sounds. Respiratory: Normal respiratory effort.  No retractions. Lungs CTAB. Gastrointestinal: Soft and nontender. No distention.  Musculoskeletal: Bruising and swelling to bilateral knees with some superficial abrasions but no lacerations.  No tenderness with flexion and extension of the joints.   No tenderness to palpation of the hips and the pelvis is stable.  Normal and nontender range of motion of bilateral hips.  No injuries are apparent to upper extremities with normal range of motion of all major joints of her arms. Neurologic:  Normal speech and language. No gross focal neurologic deficits are appreciated.  Skin:  Skin is warm, dry and intact except as described above. No rash noted.   ____________________________________________   LABS (all labs ordered are listed, but only abnormal results are displayed)  Labs Reviewed - No data to display ____________________________________________  EKG  No indication for EKG ____________________________________________  RADIOLOGY I, Hinda Kehr, personally viewed and evaluated these images (plain radiographs and CTs) as part of my medical decision making, as well as reviewing the written report by the radiologist.  ED MD interpretation: Minor nasal bone fracture, questionable tooth fragment within lower lip, otherwise no acute findings on CT scans nor radiographs  Official radiology report(s): Ct Head Wo Contrast  Result Date: 08/12/2017 CLINICAL DATA:  Fall, facial pain, bruising to face, missing tooth. EXAM: CT HEAD WITHOUT CONTRAST CT MAXILLOFACIAL WITHOUT CONTRAST CT CERVICAL SPINE WITHOUT CONTRAST TECHNIQUE: Multidetector CT imaging of the head, cervical spine, and maxillofacial structures were performed using the standard protocol without intravenous contrast. Multiplanar CT image reconstructions of the cervical spine and maxillofacial structures were also generated. COMPARISON:  Head CT dated 04/24/2014. FINDINGS: CT HEAD FINDINGS Brain: Mild generalized age related parenchymal atrophy with commensurate dilatation of the ventricles and sulci. Mild chronic small vessel ischemic changes within the deep periventricular white matter regions bilaterally. No mass, hemorrhage, edema or other evidence of acute parenchymal abnormality. No  extra-axial hemorrhage. Vascular: There are chronic calcified atherosclerotic changes of the large vessels at the skull base. No unexpected hyperdense vessel. Skull: Normal. Negative for fracture or focal lesion. Other: None. CT MAXILLOFACIAL FINDINGS Osseous: Lower frontal bones are intact. Slightly displaced fracture within the anterior aspects of the LEFT nasal bone, with slight leftward deformity. Osseous structures about the orbits are intact and normally aligned bilaterally. Walls of the maxillary sinuses appear intact and normally aligned bilaterally. Bilateral zygomatic arches and pterygoid plates are intact. No mandible fracture or displacement seen. There is fracture displacement of the LEFT lateral mandibular incisor. Presumed tooth fragment within  the overlying soft tissues (lip?). Orbits: Negative. No traumatic or inflammatory finding. Sinuses: Clear. Soft tissues: Ill-defined edema/swelling of the lower lip and inframandibular region. No circumscribed soft tissue hematoma seen. CT CERVICAL SPINE FINDINGS Alignment: Mild levoscoliosis of the lower cervical spine, possibly accentuated by patient positioning. No evidence of acute vertebral body subluxation. Skull base and vertebrae: No fracture line or displaced fracture fragment seen. Facet joints appear intact and normally aligned throughout. Soft tissues and spinal canal: No prevertebral fluid or swelling. No visible canal hematoma. Disc levels: Degenerative spondylosis within the mid and lower cervical spine, mild to moderate in degree. No more than mild central canal stenosis at any level. Upper chest: No acute findings. Other: Bilateral carotid atherosclerosis. IMPRESSION: 1. No acute intracranial abnormality. No intracranial hemorrhage or edema. No skull fracture. Chronic small vessel ischemic changes within the white matter. 2. Slightly displaced fracture within the anterior aspects of the LEFT nasal bone, with slight leftward deformity. 3.  Fracture/displacement of the LEFT lateral incisor of the mandible. Presumed tooth fragment within the overlying soft tissues (lip?). Associated soft tissue edema within the lower lip and inframandibular regions. No circumscribed soft tissue hematoma seen. 4. No fracture or acute subluxation within the cervical spine. Scoliosis and mild degenerative changes within the mid/lower cervical spine. 5. Carotid atherosclerosis. Electronically Signed   By: Franki Cabot M.D.   On: 08/12/2017 23:05   Ct Cervical Spine Wo Contrast  Result Date: 08/12/2017 CLINICAL DATA:  Fall, facial pain, bruising to face, missing tooth. EXAM: CT HEAD WITHOUT CONTRAST CT MAXILLOFACIAL WITHOUT CONTRAST CT CERVICAL SPINE WITHOUT CONTRAST TECHNIQUE: Multidetector CT imaging of the head, cervical spine, and maxillofacial structures were performed using the standard protocol without intravenous contrast. Multiplanar CT image reconstructions of the cervical spine and maxillofacial structures were also generated. COMPARISON:  Head CT dated 04/24/2014. FINDINGS: CT HEAD FINDINGS Brain: Mild generalized age related parenchymal atrophy with commensurate dilatation of the ventricles and sulci. Mild chronic small vessel ischemic changes within the deep periventricular white matter regions bilaterally. No mass, hemorrhage, edema or other evidence of acute parenchymal abnormality. No extra-axial hemorrhage. Vascular: There are chronic calcified atherosclerotic changes of the large vessels at the skull base. No unexpected hyperdense vessel. Skull: Normal. Negative for fracture or focal lesion. Other: None. CT MAXILLOFACIAL FINDINGS Osseous: Lower frontal bones are intact. Slightly displaced fracture within the anterior aspects of the LEFT nasal bone, with slight leftward deformity. Osseous structures about the orbits are intact and normally aligned bilaterally. Walls of the maxillary sinuses appear intact and normally aligned bilaterally. Bilateral  zygomatic arches and pterygoid plates are intact. No mandible fracture or displacement seen. There is fracture displacement of the LEFT lateral mandibular incisor. Presumed tooth fragment within the overlying soft tissues (lip?). Orbits: Negative. No traumatic or inflammatory finding. Sinuses: Clear. Soft tissues: Ill-defined edema/swelling of the lower lip and inframandibular region. No circumscribed soft tissue hematoma seen. CT CERVICAL SPINE FINDINGS Alignment: Mild levoscoliosis of the lower cervical spine, possibly accentuated by patient positioning. No evidence of acute vertebral body subluxation. Skull base and vertebrae: No fracture line or displaced fracture fragment seen. Facet joints appear intact and normally aligned throughout. Soft tissues and spinal canal: No prevertebral fluid or swelling. No visible canal hematoma. Disc levels: Degenerative spondylosis within the mid and lower cervical spine, mild to moderate in degree. No more than mild central canal stenosis at any level. Upper chest: No acute findings. Other: Bilateral carotid atherosclerosis. IMPRESSION: 1. No acute intracranial abnormality. No intracranial hemorrhage  or edema. No skull fracture. Chronic small vessel ischemic changes within the white matter. 2. Slightly displaced fracture within the anterior aspects of the LEFT nasal bone, with slight leftward deformity. 3. Fracture/displacement of the LEFT lateral incisor of the mandible. Presumed tooth fragment within the overlying soft tissues (lip?). Associated soft tissue edema within the lower lip and inframandibular regions. No circumscribed soft tissue hematoma seen. 4. No fracture or acute subluxation within the cervical spine. Scoliosis and mild degenerative changes within the mid/lower cervical spine. 5. Carotid atherosclerosis. Electronically Signed   By: Franki Cabot M.D.   On: 08/12/2017 23:05   Dg Knee Complete 4 Views Left  Result Date: 08/12/2017 CLINICAL DATA:  Status  post fall, with bilateral knee pain. Initial encounter. EXAM: LEFT KNEE - COMPLETE 4+ VIEW COMPARISON:  None. FINDINGS: There is no evidence of fracture or dislocation. There is narrowing of the medial compartment, with marginal osteophyte formation. Tibial spine osteophytes are seen. There is chronic deformity of the proximal fibula. No significant joint effusion is seen. The visualized soft tissues are normal in appearance. IMPRESSION: 1. No evidence of fracture or dislocation. 2. Mild osteoarthritis at the left knee, with narrowing of the medial compartment. Electronically Signed   By: Garald Balding M.D.   On: 08/12/2017 22:53   Dg Knee Complete 4 Views Right  Result Date: 08/12/2017 CLINICAL DATA:  Status post fall, with right knee pain. Initial encounter. EXAM: RIGHT KNEE - COMPLETE 4+ VIEW COMPARISON:  Right knee radiographs performed 05/20/2015 FINDINGS: There is no evidence of fracture or dislocation. There is loss of the joint space at the medial compartment, with prominent marginal osteophyte formation. There is cortical irregularity along the articular surface of the patella. Tibial spine osteophytes are noted. No significant joint effusion is seen. The visualized soft tissues are normal in appearance. IMPRESSION: 1. No evidence of fracture or dislocation. 2. Osteoarthritis at the right knee, with loss of the joint space at the medial compartment. Electronically Signed   By: Garald Balding M.D.   On: 08/12/2017 22:55   Ct Maxillofacial Wo Contrast  Result Date: 08/12/2017 CLINICAL DATA:  Fall, facial pain, bruising to face, missing tooth. EXAM: CT HEAD WITHOUT CONTRAST CT MAXILLOFACIAL WITHOUT CONTRAST CT CERVICAL SPINE WITHOUT CONTRAST TECHNIQUE: Multidetector CT imaging of the head, cervical spine, and maxillofacial structures were performed using the standard protocol without intravenous contrast. Multiplanar CT image reconstructions of the cervical spine and maxillofacial structures were also  generated. COMPARISON:  Head CT dated 04/24/2014. FINDINGS: CT HEAD FINDINGS Brain: Mild generalized age related parenchymal atrophy with commensurate dilatation of the ventricles and sulci. Mild chronic small vessel ischemic changes within the deep periventricular white matter regions bilaterally. No mass, hemorrhage, edema or other evidence of acute parenchymal abnormality. No extra-axial hemorrhage. Vascular: There are chronic calcified atherosclerotic changes of the large vessels at the skull base. No unexpected hyperdense vessel. Skull: Normal. Negative for fracture or focal lesion. Other: None. CT MAXILLOFACIAL FINDINGS Osseous: Lower frontal bones are intact. Slightly displaced fracture within the anterior aspects of the LEFT nasal bone, with slight leftward deformity. Osseous structures about the orbits are intact and normally aligned bilaterally. Walls of the maxillary sinuses appear intact and normally aligned bilaterally. Bilateral zygomatic arches and pterygoid plates are intact. No mandible fracture or displacement seen. There is fracture displacement of the LEFT lateral mandibular incisor. Presumed tooth fragment within the overlying soft tissues (lip?). Orbits: Negative. No traumatic or inflammatory finding. Sinuses: Clear. Soft tissues: Ill-defined edema/swelling of the  lower lip and inframandibular region. No circumscribed soft tissue hematoma seen. CT CERVICAL SPINE FINDINGS Alignment: Mild levoscoliosis of the lower cervical spine, possibly accentuated by patient positioning. No evidence of acute vertebral body subluxation. Skull base and vertebrae: No fracture line or displaced fracture fragment seen. Facet joints appear intact and normally aligned throughout. Soft tissues and spinal canal: No prevertebral fluid or swelling. No visible canal hematoma. Disc levels: Degenerative spondylosis within the mid and lower cervical spine, mild to moderate in degree. No more than mild central canal stenosis  at any level. Upper chest: No acute findings. Other: Bilateral carotid atherosclerosis. IMPRESSION: 1. No acute intracranial abnormality. No intracranial hemorrhage or edema. No skull fracture. Chronic small vessel ischemic changes within the white matter. 2. Slightly displaced fracture within the anterior aspects of the LEFT nasal bone, with slight leftward deformity. 3. Fracture/displacement of the LEFT lateral incisor of the mandible. Presumed tooth fragment within the overlying soft tissues (lip?). Associated soft tissue edema within the lower lip and inframandibular regions. No circumscribed soft tissue hematoma seen. 4. No fracture or acute subluxation within the cervical spine. Scoliosis and mild degenerative changes within the mid/lower cervical spine. 5. Carotid atherosclerosis. Electronically Signed   By: Franki Cabot M.D.   On: 08/12/2017 23:05    ____________________________________________   PROCEDURES  Critical Care performed: No   Procedure(s) performed:   Procedures   ____________________________________________   INITIAL IMPRESSION / ASSESSMENT AND PLAN / ED COURSE  As part of my medical decision making, I reviewed the following data within the [place] History obtained from family, Nursing notes reviewed and incorporated and Radiograph reviewed     Differential diagnosis includes, but is not limited to, mechanical fall, cardiogenic syncope, infectious process, much abnormality.  The fall could have led to intracranial bleeding, fractures/dislocations, dental injury, facial fracture, etc.  Fortunately the patient's work-up was reassuring with extensive bruising and contusions but no evidence of internal injury or emergent medical condition.  I explained to the patient and her daughter about the nasal bone fracture and recommended ENT follow-up and no indication for current intervention.  I read the report and reviewed the CT scan that suggested a tooth  fragment in the lip but I examined it carefully with palpation and direct observation and cannot see where there would or could even be a fracture of tooth within the lip.  I explained to the patient and daughter that there could still be a fragment in her lip, but I cannot find it, and they understand.  I did discuss putting a relative laceration itself is superficial and that I do not want to try a bacteria with in the wound, I explained I think it is better to heal by secondary intention and they understand and agree, she does not want sutures.  She will need to stay on a soft diet anyway due to the dental injury.  Encourage close outpatient follow-up with a dentist and provided the dental resource guide.  I also recommended close follow-up with your primary care doctor and I gave my usual customary return precautions for a fall.  The daughter will be staying with her for the next several days.  I gave Tylenol in the ED but also give her prescription for a few tramadol because I told her she can expect to be in more pain tomorrow than she is currently.  Patient and family understand and agree with the plan.     ____________________________________________  FINAL CLINICAL IMPRESSION(S) /  ED DIAGNOSES  Final diagnoses:  Fall, initial encounter  Contusion of face, initial encounter  Contusion of knee, unspecified laterality, initial encounter  Closed fracture of nasal bone, initial encounter  Closed fracture of tooth, initial encounter  Lip laceration, initial encounter     MEDICATIONS GIVEN DURING THIS VISIT:  Medications  acetaminophen (TYLENOL) tablet 1,000 mg (1,000 mg Oral Given 08/12/17 2339)     ED Discharge Orders        Ordered    traMADol (ULTRAM) 50 MG tablet     08/12/17 2330       Note:  This document was prepared using Dragon voice recognition software and may include unintentional dictation errors.    Hinda Kehr, MD 08/13/17 [phone]

## 2017-08-12 NOTE — Discharge Instructions (Addendum)
You have been seen in the Emergency Department (ED) today for a fall.  Your work up does not show any injuries that will require you to stay in the hospital.  You have a mild nasal bone fracture, but typically these do not require intervention.  You can follow up with an ENT specialist in about a week for further evaluation if you like.  Please be careful not to blow your nose, and if you need to sneeze, please try to keep your mouth open so you do not build up too much pressure inside your head and injure your sinuses.    We discussed suturing your lip laceration, but we decided to let it heal on its own.  Please stick to a soft diet for the safety of both your lip laceration and your dental injuries.  It is important you follow up with a dental appointment as soon as possible.  Contact a local dentist, or refer to the included dental guide for other options.  Please take over-the-counter Tylenol as needed for your pain (unless you have an allergy or your doctor as told you not to take it), or take any prescribed medication as instructed.  Take Tramadol as prescribed for severe pain. Do not drink alcohol, drive or participate in any other potentially dangerous activities while taking this medication as it may make you sleepy. Do not take this medication with any other sedating medications, either prescription or over-the-counter. If you were prescribed Percocet or Vicodin, do not take these with acetaminophen (Tylenol) as it is already contained within these medications.   This medication is an opiate (or narcotic) pain medication and can be habit forming.  Use it as little as possible to achieve adequate pain control.  Do not use or use it with extreme caution if you have a history of opiate abuse or dependence.  If you are on a pain contract with your primary care doctor or a pain specialist, be sure to let them know you were prescribed this medication today from the [facility] Emergency Department.   This medication is intended for your use only - do not give any to anyone else and keep it in a secure place where nobody else, especially children, have access to it.  It will also cause or worsen constipation, so you may want to consider taking an over-the-counter stool softener while you are taking this medication.   Please follow up with your doctor regarding today's Emergency Department (ED) visit and your recent fall.    Return to the ED if you have any headache, confusion, slurred speech, weakness/numbness of any arm or leg, or any increased pain.

## 2017-08-12 NOTE — ED Notes (Signed)
Pt states she got her toe hung on something and then she fell face first to ground injuring her teeth, lips, knees, chin and forehead. Pt is alert and oriented x 4. Family at bedside

## 2017-08-18 ENCOUNTER — Other Ambulatory Visit: Payer: Self-pay | Admitting: Cardiovascular Disease

## 2017-08-19 ENCOUNTER — Telehealth: Payer: Self-pay | Admitting: Cardiovascular Disease

## 2017-08-19 NOTE — Telephone Encounter (Signed)
Amber Wolfe with [name] calling asking instead of the 100 mg on her amiodarone that we sent in could we send in 200 mg and she cut it in half  For the 100 mg is going to cost her $75 a month   Please advise

## 2017-08-19 NOTE — Telephone Encounter (Signed)
Please see note below and advise if okay for dose change.

## 2017-08-19 NOTE — Telephone Encounter (Signed)
Spoke with Jody from [employer].  Told him it was okay to change Amiodarone to 200MG  1/2 tablet daily.

## 2017-08-19 NOTE — Telephone Encounter (Signed)
It is fine to send in amiodarone 200 mg - take 1/2 tablet (100 mg) once daily.

## 2017-09-03 ENCOUNTER — Other Ambulatory Visit: Payer: Self-pay | Admitting: Cardiovascular Disease

## 2017-09-16 ENCOUNTER — Ambulatory Visit: Payer: Medicare Other

## 2017-10-01 ENCOUNTER — Other Ambulatory Visit: Payer: Self-pay | Admitting: Cardiovascular Disease

## 2017-10-01 NOTE — Telephone Encounter (Signed)
Refill Request.  

## 2017-10-05 ENCOUNTER — Encounter: Payer: Self-pay | Admitting: Cardiovascular Disease

## 2017-10-05 ENCOUNTER — Ambulatory Visit: Payer: Medicare Other | Admitting: Cardiovascular Disease

## 2017-10-05 VITALS — BP 136/62 | HR 67 | Ht 66.0 in | Wt 150.8 lb

## 2017-10-05 DIAGNOSIS — I10 Essential (primary) hypertension: Secondary | ICD-10-CM | POA: Diagnosis not present

## 2017-10-05 DIAGNOSIS — E785 Hyperlipidemia, unspecified: Secondary | ICD-10-CM

## 2017-10-05 DIAGNOSIS — I480 Paroxysmal atrial fibrillation: Secondary | ICD-10-CM

## 2017-10-05 NOTE — Patient Instructions (Signed)
Medication Instructions: Continue same medications.   Labwork: None.   Procedures/Testing: None.   Follow-Up: 6 months with Dr. Arida.   Any Additional Special Instructions Will Be Listed Below (If Applicable).     If you need a refill on your cardiac medications before your next appointment, please call your pharmacy.   

## 2017-10-05 NOTE — Progress Notes (Signed)
Cardiology Office Note   Date:  10/05/2017   ID:  Amber Wolfe, DOB [birth year], MRN [id number]  PCP:  Arnetha Courser, MD  Cardiologist:   Kathlyn Sacramento, MD   Chief Complaint  Patient presents with  . other    3 month f/u no complaints today. Meds reviewed verbally with pt.      History of Present Illness: Amber Wolfe is a 84 y.o. female who presents for a follow-up visit regarding atrial flutter/fibrillation. She had successful cardioversion in May 2017 and currently on long-term anticoagulation with Xarelto. She had recurrent atrial fibrillation in November 2017 and was placed on amiodarone. Other medical problems include diabetes mellitus, hypertension, hyperlipidemia and moderate mitral regurgitation. She had a nuclear stress test done in March 2017 which showed no evidence of ischemia or perfusion defects with normal ejection fraction. She had mildly reduced ejection fraction the setting of atrial fibrillation and tachycardia but most recent echocardiogram in May of 2018 year showed an EF of 60-65%, mild to moderate mitral regurgitation, mildly dilated left atrium and mild pulmonary hypertension. Beta-blockers were discontinued due to bradycardia.  Amiodarone was decreased to 100 mg daily in April due to persistent bradycardia. She had an echocardiogram done in April which showed an EF of 55 to 60% with moderately dilated left atrium, mild to moderate tricuspid regurgitation with estimated systolic pulmonary pressure between 40-45 mmHg.  The patient fell in May after she lost her balance and had significant physical injuries.  She was evaluated in the emergency department.  She did not have any syncope. She has been doing very well with no recent chest pain, shortness of breath or palpitations.  She reports no bleeding complications with anticoagulation.   Past Medical History:  Diagnosis Date  . Arthritis    In hands  . Atrial flutter ([place])    a. s/p successful TEE/DCCV  07/31/2015; b. on Xarelto; c. CHADS2VASc => 6 (CHF, HTN, age x 2, DM, female)  . Cataract   . Chronic systolic CHF (congestive heart failure) ([place])    a. echo 06/2015: EF 40%, basal HK, nl WM of mid and apical segments, mild to mod MR/TR; b. TEE 4/0/3474: nl LV systolic function, mild to mod MR, trivial TR  . Diabetes mellitus without complication ([place])    Type II  . GERD (gastroesophageal reflux disease)   . Glaucoma    Right Eye  . Hyperlipidemia   . Hypertension   . Moderate mitral regurgitation    a. see echo from 06/2015 and TEE 07/2015  . PAF (paroxysmal atrial fibrillation) ([place])    a.  01/2016 Admitted to [facility] with PAF -->amio added.    Past Surgical History:  Procedure Laterality Date  . CATARACT EXTRACTION Bilateral   . CESAREAN SECTION    . ELECTROPHYSIOLOGIC STUDY N/A 07/31/2015   Procedure: CARDIOVERSION;  Surgeon: Wende Bushy, MD;  Location: [facility] ORS;  Service: Cardiovascular;  Laterality: N/A;  . ELECTROPHYSIOLOGIC STUDY N/A 04/03/2016   Procedure: CARDIOVERSION;  Surgeon: Minna Merritts, MD;  Location: [facility] ORS;  Service: Cardiovascular;  Laterality: N/A;  . TEE WITHOUT CARDIOVERSION N/A 07/31/2015   Procedure: TRANSESOPHAGEAL ECHOCARDIOGRAM (TEE);  Surgeon: Wende Bushy, MD;  Location: [facility] ORS;  Service: Cardiovascular;  Laterality: N/A;  . WISDOM TOOTH EXTRACTION    . Wrist Surgery Left      Current Outpatient Medications  Medication Sig Dispense Refill  . acetaminophen (TYLENOL) 500 MG tablet Take 500-1,000 mg by mouth daily as needed for  moderate pain or headache.    . alendronate (FOSAMAX) 70 MG tablet Take 1 tablet (70 mg total) by mouth every 7 (seven) days. Take with a full glass of water on an empty stomach. 4 tablet 11  . amiodarone (PACERONE) 100 MG tablet Take 1 tablet (100 mg total) by mouth daily. 30 tablet 2  . amLODipine (NORVASC) 10 MG tablet Take 1 tablet (10 mg total) by mouth daily. 90 tablet 3  . atorvastatin (LIPITOR) 20 MG tablet TAKE 1 TABLET BY  MOUTH AT BEDTIME 30 tablet 11  . Blood Pressure KIT Check blood pressure daily or as needed if tired or fatigued; dx I10 1 each 0  . furosemide (LASIX) 20 MG tablet Take 1 tablet (20 mg total) by mouth daily. 30 tablet 2  . latanoprost (XALATAN) 0.005 % ophthalmic solution Place 1 drop into both eyes at bedtime.     Marland Kitchen lisinopril (PRINIVIL,ZESTRIL) 40 MG tablet Take 1 tablet (40 mg total) by mouth daily. 90 tablet 3  . timolol (TIMOPTIC) 0.5 % ophthalmic solution Place 1 drop into both eyes at bedtime.     . traMADol (ULTRAM) 50 MG tablet Take 1-2 tablets by mouth every 6 hours as needed for moderate to severe pain 20 tablet 0  . XARELTO 20 MG TABS tablet TAKE ONE (1) TABLET EACH DAY WITH SUPPER 30 tablet 2   No current facility-administered medications for this visit.     Allergies:   Patient has no known allergies.    Social History:  The patient  reports that she has never smoked. She has never used smokeless tobacco. She reports that she does not drink alcohol or use drugs.   Family History:  The patient's family history includes Aneurysm in her daughter; Cataracts in her mother; Heart attack in her brother, father, maternal grandfather, mother, and paternal grandfather.    ROS:  Please see the history of present illness.   Otherwise, review of systems are positive for none.   All other systems are reviewed and negative.    PHYSICAL EXAM: VS:  BP 136/62 (BP Location: Left Arm, Patient Position: Sitting, Cuff Size: Normal)   Pulse 67   Ht _0  (1.676 m)   Wt 150 lb 12 oz (68.4 kg)   BMI 24.33 kg/m  , BMI Body mass index is 24.33 kg/m. GEN: Well nourished, well developed, in no acute distress  HEENT: normal  Neck: no JVD, carotid bruits, or masses Cardiac: RRR; no  rubs, or gallops,no edema . 2/6 crescendo decrescendo systolic murmur in the aortic area which is early peaking Respiratory:  clear to auscultation bilaterally, normal work of breathing GI: soft, nontender,  nondistended, + BS MS: no deformity or atrophy  Skin: warm and dry, no rash Neuro:  Strength and sensation are intact Psych: euthymic mood, full affect   EKG:  EKG is ordered today. The ekg ordered today demonstrates normal sinus rhythm with left ventricular hypertrophy with repolarization abnormalities.   Recent Labs: 12/21/2016: Hemoglobin 12.0; Platelets 170; TSH 3.82 06/21/2017: ALT 15; BUN 15; Creat 0.76; Potassium 3.8; Sodium 143    Lipid Panel    Component Value Date/Time   CHOL 132 06/21/2017 1414   CHOL 159 05/15/2015 1619   TRIG 97 06/21/2017 1414   TRIG 124 05/15/2015 1619   HDL 55 06/21/2017 1414   CHOLHDL 2.4 06/21/2017 1414   VLDL 15 06/15/2016 1459   VLDL 25 05/15/2015 1619   LDLCALC 59 06/21/2017 1414  Wt Readings from Last 3 Encounters:  10/05/17 150 lb 12 oz (68.4 kg)  08/12/17 161 lb (73 kg)  07/06/17 170 lb 12 oz (77.5 kg)      No flowsheet data found.    ASSESSMENT AND PLAN:  1.  Paroxysmal atrial fibrillation: She is maintaining in sinus rhythm with small dose amiodarone and tolerating long-term anticoagulation with Xarelto.  Bradycardia resolved after stopping beta-blockers and decreasing amiodarone to 100 mg once daily. The patient had a mechanical fall in May with significant contusions.  I stressed the importance of being cautious given that she is on long-term anticoagulation.  She is interested in getting a walker. The patient continues to be on long-term amiodarone therapy.  She did have thyroid and liver testing done in the last year which were in the normal range.  2. Essential hypertension: Blood pressure is controlled on current medications  3. Hyperlipidemia: Currently on atorvastatin with most recent LDL 54.  Disposition:   FU with me in 6 months  Signed,  Kathlyn Sacramento, MD  10/05/2017 2:08 PM    [facility]

## 2017-11-02 ENCOUNTER — Ambulatory Visit (INDEPENDENT_AMBULATORY_CARE_PROVIDER_SITE_OTHER): Payer: Medicare Other

## 2017-11-02 VITALS — BP 130/60 | HR 54 | Temp 97.7°F | Resp 12 | Ht 66.0 in | Wt 143.2 lb

## 2017-11-02 DIAGNOSIS — Z0000 Encounter for general adult medical examination without abnormal findings: Secondary | ICD-10-CM

## 2017-11-02 DIAGNOSIS — Z9181 History of falling: Secondary | ICD-10-CM | POA: Diagnosis not present

## 2017-11-02 NOTE — Progress Notes (Signed)
Subjective:   Amber Wolfe is a 84 y.o. female who presents for an Initial Medicare Annual Wellness Visit.  Review of Systems    N/A  Cardiac Risk Factors include: advanced age (>103mn, >>32women);dyslipidemia;diabetes mellitus;hypertension;sedentary lifestyle     Objective:    Today's Vitals   11/02/17 0906  BP: 130/60  Pulse: (!) 54  Resp: 12  Temp: 97.7 F (36.5 C)  TempSrc: Oral  SpO2: 94%  Weight: 143 lb 3.2 oz (65 kg)  Height: _0  (1.676 m)   Body mass index is 23.11 kg/m.  Advanced Directives 11/02/2017 12/17/2016 06/15/2016 04/03/2016 03/24/2016 02/17/2016 09/19/2015  Does Patient Have a Medical Advance Directive? _1  No No  Would patient like information on creating a medical advance directive? Yes (MAU/Ambulatory/Procedural Areas - Information given) - - - - No - patient declined information -    Current Medications (verified) Outpatient Encounter Medications as of 11/02/2017  Medication Sig  . acetaminophen (TYLENOL) 500 MG tablet Take 500-1,000 mg by mouth daily as needed for moderate pain or headache.  . alendronate (FOSAMAX) 70 MG tablet Take 1 tablet (70 mg total) by mouth every 7 (seven) days. Take with a full glass of water on an empty stomach.  .Marland Kitchenamiodarone (PACERONE) 100 MG tablet Take 1 tablet (100 mg total) by mouth daily.  .Marland KitchenamLODipine (NORVASC) 10 MG tablet Take 1 tablet (10 mg total) by mouth daily.  .Marland Kitchenatorvastatin (LIPITOR) 20 MG tablet TAKE 1 TABLET BY MOUTH AT BEDTIME  . Blood Pressure KIT Check blood pressure daily or as needed if tired or fatigued; dx I10  . furosemide (LASIX) 20 MG tablet Take 1 tablet (20 mg total) by mouth daily.  .Marland Kitchenlatanoprost (XALATAN) 0.005 % ophthalmic solution Place 1 drop into both eyes at bedtime.   .Marland Kitchenlisinopril (PRINIVIL,ZESTRIL) 40 MG tablet Take 1 tablet (40 mg total) by mouth daily.  . timolol (TIMOPTIC) 0.5 % ophthalmic solution Place 1 drop into both eyes at bedtime.   .Alveda Reasons20 MG TABS tablet TAKE  ONE (1) TABLET EACH DAY WITH SUPPER  . traMADol (ULTRAM) 50 MG tablet Take 1-2 tablets by mouth every 6 hours as needed for moderate to severe pain   No facility-administered encounter medications on file as of 11/02/2017.     Allergies (verified) Patient has no known allergies.   History: Past Medical History:  Diagnosis Date  . Arthritis    In hands  . Atrial flutter ([place]    a. s/p successful TEE/DCCV 07/31/2015; b. on Xarelto; c. CHADS2VASc => 6 (CHF, HTN, age x 2, DM, female)  . Cataract   . Chronic systolic CHF (congestive heart failure) ([place]    a. echo 06/2015: EF 40%, basal HK, nl WM of mid and apical segments, mild to mod MR/TR; b. TEE 58/07/4625 nl LV systolic function, mild to mod MR, trivial TR  . Diabetes mellitus without complication ([facility]    Type II  . GERD (gastroesophageal reflux disease)   . Glaucoma    Right Eye  . Hyperlipidemia   . Hypertension   . Moderate mitral regurgitation    a. see echo from 06/2015 and TEE 07/2015  . PAF (paroxysmal atrial fibrillation) ([facility]    [facility]  [facility]   Past Surgical History:  Procedure Laterality Date  . CATARACT EXTRACTION Bilateral   . CESAREAN SECTION    . ELECTROPHYSIOLOGIC STUDY N/A 07/31/2015   Procedure: CARDIOVERSION;  Surgeon: Wende Bushy, MD;  Location: [facility] ORS;  Service: Cardiovascular;  Laterality: N/A;  . ELECTROPHYSIOLOGIC STUDY N/A 04/03/2016   Procedure: CARDIOVERSION;  Surgeon: Minna Merritts, MD;  Location: [facility] ORS;  Service: Cardiovascular;  Laterality: N/A;  . TEE WITHOUT CARDIOVERSION N/A 07/31/2015   Procedure: TRANSESOPHAGEAL ECHOCARDIOGRAM (TEE);  Surgeon: Wende Bushy, MD;  Location: [facility] ORS;  Service: Cardiovascular;  Laterality: N/A;  . WISDOM TOOTH EXTRACTION    . Wrist Surgery Left    Family History  Problem Relation Age of Onset  . Cataracts Mother   . Heart attack Mother   . Heart attack Father   . Heart attack Brother   . Aneurysm Daughter   . Heart  attack Maternal Grandfather   . Heart attack Paternal Grandfather    Social History   Socioeconomic History  . Marital status: Widowed    Spouse name: Not on file  . Number of children: 1  . Years of education: Not on file  . Highest education level: 12th grade  Occupational History  . Occupation: Retired  Scientific laboratory technician  . Financial resource strain: Not hard at all  . Food insecurity:    Worry: Never true    Inability: Never true  . Transportation needs:    Medical: No    Non-medical: No  Tobacco Use  . Smoking status: Never Smoker  . Smokeless tobacco: Never Used  . Tobacco comment: smoking cessation materials not required  Substance and Sexual Activity  . Alcohol use: No  . Drug use: No  . Sexual activity: Not Currently  Lifestyle  . Physical activity:    Days per week: 0 days    Minutes per session: 0 min  . Stress: To some extent  Relationships  . Social connections:    Talks on phone: Patient refused    Gets together: Patient refused    Attends religious service: Patient refused    Active member of club or organization: Patient refused    Attends meetings of clubs or organizations: Patient refused    Relationship status: Widowed  Other Topics Concern  . Not on file  Social History Narrative  . Not on file    Tobacco Counseling Counseling given: No Comment: smoking cessation materials not required  Clinical Intake:  Pre-visit preparation completed: Yes  Pain : No/denies pain   BMI - recorded: 23.11 Nutritional Status: BMI of 19-24  Normal Nutritional Risks: None  Nutrition Risk Assessment: Has the patient had any N/V/D within the last 2 months?  No Does the patient have any non-healing wounds?  No Has the patient had any unintentional weight loss or weight gain?  No  Is the patient diabetic?  Yes If diabetic, was a CBG obtained today?  No Did the patient bring in their glucometer from home?  No Comments: Pt does not monitor CBG's. Denies any  financial strains with the device or supplies.  Diabetic Exams: Diabetic Eye Exam: Completed 07/12/17.  Diabetic Foot Exam: Completed 12/17/16. Pt has been advised about the importance in completing this exam. Pt has been advised to schedule and appointment with Dr. Sanda Klein for completion of her diabetic foot exam.  How often do you need to have someone help you when you read instructions, pamphlets, or other written materials from your doctor or pharmacy?: 1 - Never  Interpreter Needed?: No  Information entered by :: AEversole, LPN   Activities of Daily Living In your present state of health, do you have any difficulty performing the following  activities: 11/02/2017 06/17/2017  Hearing? N Y  Comment denies hearing aids -  Vision? N N  Comment wears eyeglasses; glaucoma -  Difficulty concentrating or making decisions? Y N  Comment short term memory loss -  Walking or climbing stairs? Y N  Comment joint pain -  Dressing or bathing? N N  Doing errands, shopping? Y Y  Comment dtr transports -  Conservation officer, nature and eating ? N -  Comment denies dentures -  Using the Toilet? N -  In the past six months, have you accidently leaked urine? N -  Do you have problems with loss of bowel control? N -  Managing your Medications? N -  Managing your Finances? N -  Housekeeping or managing your Housekeeping? N -  Some recent data might be hidden     Immunizations and Health Maintenance Immunization History  Administered Date(s) Administered  . Influenza,inj,Quad PF,6+ Mos 01/02/2017  . Influenza-Unspecified 01/29/2016  . Pneumococcal Conjugate-13 02/11/2015  . Pneumococcal Polysaccharide-23 02/12/2016   Health Maintenance Due  Topic Date Due  . INFLUENZA VACCINE  10/28/2017    Patient Care Team: Lada, Satira Anis, MD as PCP - General (Family Medicine) Wellington Hampshire, MD as Consulting Physician (Cardiology)  Indicate any recent Medical Services you may have received from other than [facility]  providers in the past year (date may be approximate).     Assessment:   This is a routine wellness examination for [place].  Hearing/Vision screen Vision Screening Comments: Sees Dr. Wallace Going for annual eye exams  Dietary issues and exercise activities discussed: Current Exercise Habits: The patient does not participate in regular exercise at present, Exercise limited by: None identified  Goals    . DIET - INCREASE WATER INTAKE     Recommend to drink at least 6-8 8oz glasses of water per day.      Depression Screen PHQ 2/9 Scores 11/02/2017 06/17/2017 12/17/2016 06/15/2016 03/24/2016 09/19/2015 05/20/2015  PHQ - 2 Score 2 0 0 0 0 0 1  PHQ- 9 Score 6 - - - - - -    Fall Risk Fall Risk  11/02/2017 06/17/2017 12/17/2016 06/15/2016 03/24/2016  Falls in the past year? _0   Comment lost balance and fell - - - -  Number falls in past yr: 2 or more 1 2 or more 1 1  Injury with Fall? No No Yes Yes No  Comment - - - left broken shoulder -  Risk Factor Category  High Fall Risk - High Fall Risk - -  Risk for fall due to : Impaired vision;History of fall(s);Impaired balance/gait;Medication side effect - - - -  Risk for fall due to: Comment wears eyeglasses; glaucoma; broke shoulder in the past and knocked teeth out from previous falls - - - -  Follow up Falls evaluation completed;Education provided;Falls prevention discussed - Falls evaluation completed - -    FALL RISK PREVENTION PERTAINING TO HOME: Is your home free of loose throw rugs in walkways, pet beds, electrical cords, etc? Yes Is there adequate lighting in your home to reduce risk of falls?  Yes Are there stairs in or around your home WITH handrails? Yes  ASSISTIVE DEVICES UTILIZED TO PREVENT FALLS: Use of a cane, walker or w/c? No Grab bars in the bathroom? No  Shower chair or a place to sit while bathing? No An elevated toilet seat or a handicapped toilet? No  Timed Get Up and Go Performed: Yes. Pt ambulated 10 feet  within  28 sec. Gait slow, slightly unsteady and without the use of an assistive device. Declined my offer to refer to PT for strengthening and ambulatory retraining. May benefit from use of quad cane during ambulation. Fall risk prevention has been discussed.  Community Resource Referral:  Data processing manager Referral sent to Care Guide for an elevated toilet seat. However, pt declined my offer to send [employer] Referral to Care Guide for installation of grab bars in the shower or a shower chair.  Cognitive Function:     6CIT Screen 11/02/2017  What Year? 0 points  What month? 0 points  What time? 3 points  Count back from 20 0 points  Months in reverse 4 points  Repeat phrase 4 points  Total Score 11    Screening Tests Health Maintenance  Topic Date Due  . INFLUENZA VACCINE  10/28/2017  . TETANUS/TDAP  06/18/2018 (Originally 09/24/1950)  . FOOT EXAM  12/17/2017  . HEMOGLOBIN A1C  12/22/2017  . OPHTHALMOLOGY EXAM  07/13/2018  . DEXA SCAN  Completed  . PNA vac Low Risk Adult  Completed    Qualifies for Shingles Vaccine? Yes. Due for Shingrix. Education has been provided regarding the importance of this vaccine. Pt has been advised to call insurance company to determine out of pocket expense. Advised may also receive vaccine at local pharmacy or Health Dept. Verbalized acceptance and understanding.  Due for Tdap vaccine. Education has been provided regarding the importance of this vaccine. Advised may receive this vaccine at local pharmacy or Health Dept. Aware to provide a copy of the vaccination record if obtained from local pharmacy or Health Dept. Verbalized acceptance and understanding.  Cancer Screenings: Lung: Low Dose CT Chest recommended if Age 18-80 years, 30 pack-year currently smoking OR have quit w/in 15years. Patient does not qualify. Breast Screening: No longer required Up to date of Bone Density/Dexa? Yes. Completed 12/02/16. Results reflect osteoporosis. Repeat  every 2 years.  Colorectal: No longer required  Additional Screenings: Hepatitis C Screening: Does not qualify   Plan:  I have personally reviewed and addressed the Medicare Annual Wellness questionnaire and have noted the following in the patient's chart:  A. Medical and social history B. Use of alcohol, tobacco or illicit drugs  C. Current medications and supplements D. Functional ability and status E.  Nutritional status F.  Physical activity G. Advance directives H. List of other physicians I.  Hospitalizations, surgeries, and ER visits in previous 12 months J.  [place] such as hearing and vision if needed, cognitive and depression L. Referrals and appointments  In addition, I have reviewed and discussed with patient certain preventive protocols, quality metrics, and best practice recommendations. A written personalized care plan for preventive services as well as general preventive health recommendations were provided to patient.  See attached scanned questionnaire for additional information.   Signed,  Aleatha Borer, LPN Nurse Health Advisor

## 2017-11-02 NOTE — Patient Instructions (Addendum)
Amber Wolfe , Thank you for taking time to come for your Medicare Wellness Visit. I appreciate your ongoing commitment to your health goals. Please review the following plan we discussed and let me know if I can assist you in the future.   Screening recommendations/referrals: Colorectal Screening: No longer required Mammogram: No longer required Bone Density: No longer required  Vision and Dental Exams: Recommended annual ophthalmology exams for early detection of glaucoma and other disorders of the eye Recommended annual dental exams for proper oral hygiene  Diabetic Exams: Recommended annual diabetic eye exams for early detection of retinopathy Recommended annual diabetic foot exams for early detection of peripheral neuropathy.  Diabetic Eye Exam: Up to date Diabetic Foot Exam: Please schedule an appointment with Dr. Sanda Klein for completion  Vaccinations: Influenza vaccine: Up to date Pneumococcal vaccine: Up to date Tdap vaccine: Declined. Please call your insurance company to determine your out of pocket expense. You may also receive this vaccine at your local pharmacy or Health Dept. Shingles vaccine: Please call your insurance company to determine your out of pocket expense for the Shingrix vaccine. You may also receive this vaccine at your local pharmacy or Health Dept.  Advanced directives: Advance directive discussed with you today. I have provided a copy for you to complete at home and have notarized. Once this is complete please bring a copy in to our office so we can scan it into your chart.  Goals: Recommend to drink at least 6-8 8oz glasses of water per day.  Next appointment: Please schedule your Annual Wellness Visit with your Nurse Health Advisor in one year.  Please schedule an appointment with Dr. Sanda Klein within the next 30 days for follow up after today's Annual Wellness Visit.  Preventive Care 82 Years and Older, Female Preventive care refers to lifestyle choices and  visits with your health care provider that can promote health and wellness. What does preventive care include?  A yearly physical exam. This is also called an annual well check.  Dental exams once or twice a year.  Routine eye exams. Ask your health care provider how often you should have your eyes checked.  Personal lifestyle choices, including:  Daily care of your teeth and gums.  Regular physical activity.  Eating a healthy diet.  Avoiding tobacco and drug use.  Limiting alcohol use.  Practicing safe sex.  Taking low-dose aspirin every day.  Taking vitamin and mineral supplements as recommended by your health care provider. What happens during an annual well check? The services and screenings done by your health care provider during your annual well check will depend on your age, overall health, lifestyle risk factors, and family history of disease. Counseling  Your health care provider may ask you questions about your:  Alcohol use.  Tobacco use.  Drug use.  Emotional well-being.  Home and relationship well-being.  Sexual activity.  Eating habits.  History of falls.  Memory and ability to understand (cognition).  Work and work Statistician.  Reproductive health. Screening  You may have the following tests or measurements:  Height, weight, and BMI.  Blood pressure.  Lipid and cholesterol levels. These may be checked every 5 years, or more frequently if you are over 82 years old.  Skin check.  Lung cancer screening. You may have this screening every year starting at age 82 if you have a 30-pack-year history of smoking and currently smoke or have quit within the past 15 years.  Fecal occult blood test (FOBT) of the  stool. You may have this test every year starting at age 82.  Flexible sigmoidoscopy or colonoscopy. You may have a sigmoidoscopy every 5 years or a colonoscopy every 10 years starting at age 82.  Hepatitis C blood test.  Hepatitis B  blood test.  Sexually transmitted disease (STD) testing.  Diabetes screening. This is done by checking your blood sugar (glucose) after you have not eaten for a while (fasting). You may have this done every 1-3 years.  Bone density scan. This is done to screen for osteoporosis. You may have this done starting at age 82.  Mammogram. This may be done every 1-2 years. Talk to your health care provider about how often you should have regular mammograms. Talk with your health care provider about your test results, treatment options, and if necessary, the need for more tests. Vaccines  Your health care provider may recommend certain vaccines, such as:  Influenza vaccine. This is recommended every year.  Tetanus, diphtheria, and acellular pertussis (Tdap, Td) vaccine. You may need a Td booster every 10 years.  Zoster vaccine. You may need this after age 82.  Pneumococcal 13-valent conjugate (PCV13) vaccine. One dose is recommended after age 82.  Pneumococcal polysaccharide (PPSV23) vaccine. One dose is recommended after age 1. Talk to your health care provider about which screenings and vaccines you need and how often you need them. This information is not intended to replace advice given to you by your health care provider. Make sure you discuss any questions you have with your health care provider. Document Released: 04/12/2015 Document Revised: 12/04/2015 Document Reviewed: 01/15/2015 Elsevier Interactive Patient Education  2017 [place] Prevention in the Home Falls can cause injuries. They can happen to people of all ages. There are many things you can do to make your home safe and to help prevent falls. What can I do on the outside of my home?  Regularly fix the edges of walkways and driveways and fix any cracks.  Remove anything that might make you trip as you walk through a door, such as a raised step or threshold.  Trim any bushes or trees on the path to your  home.  Use bright outdoor lighting.  Clear any walking paths of anything that might make someone trip, such as rocks or tools.  Regularly check to see if handrails are loose or broken. Make sure that both sides of any steps have handrails.  Any raised decks and porches should have guardrails on the edges.  Have any leaves, snow, or ice cleared regularly.  Use sand or salt on walking paths during winter.  Clean up any spills in your garage right away. This includes oil or grease spills. What can I do in the bathroom?  Use night lights.  Install grab bars by the toilet and in the tub and shower. Do not use towel bars as grab bars.  Use non-skid mats or decals in the tub or shower.  If you need to sit down in the shower, use a plastic, non-slip stool.  Keep the floor dry. Clean up any water that spills on the floor as soon as it happens.  Remove soap buildup in the tub or shower regularly.  Attach bath mats securely with double-sided non-slip rug tape.  Do not have throw rugs and other things on the floor that can make you trip. What can I do in the bedroom?  Use night lights.  Make sure that you have a light by your bed  that is easy to reach.  Do not use any sheets or blankets that are too big for your bed. They should not hang down onto the floor.  Have a firm chair that has side arms. You can use this for support while you get dressed.  Do not have throw rugs and other things on the floor that can make you trip. What can I do in the kitchen?  Clean up any spills right away.  Avoid walking on wet floors.  Keep items that you use a lot in easy-to-reach places.  If you need to reach something above you, use a strong step stool that has a grab bar.  Keep electrical cords out of the way.  Do not use floor polish or wax that makes floors slippery. If you must use wax, use non-skid floor wax.  Do not have throw rugs and other things on the floor that can make you  trip. What can I do with my stairs?  Do not leave any items on the stairs.  Make sure that there are handrails on both sides of the stairs and use them. Fix handrails that are broken or loose. Make sure that handrails are as long as the stairways.  Check any carpeting to make sure that it is firmly attached to the stairs. Fix any carpet that is loose or worn.  Avoid having throw rugs at the top or bottom of the stairs. If you do have throw rugs, attach them to the floor with carpet tape.  Make sure that you have a light switch at the top of the stairs and the bottom of the stairs. If you do not have them, ask someone to add them for you. What else can I do to help prevent falls?  Wear shoes that:  Do not have high heels.  Have rubber bottoms.  Are comfortable and fit you well.  Are closed at the toe. Do not wear sandals.  If you use a stepladder:  Make sure that it is fully opened. Do not climb a closed stepladder.  Make sure that both sides of the stepladder are locked into place.  Ask someone to hold it for you, if possible.  Clearly mark and make sure that you can see:  Any grab bars or handrails.  First and last steps.  Where the edge of each step is.  Use tools that help you move around (mobility aids) if they are needed. These include:  Canes.  Walkers.  Scooters.  Crutches.  Turn on the lights when you go into a dark area. Replace any light bulbs as soon as they burn out.  Set up your furniture so you have a clear path. Avoid moving your furniture around.  If any of your floors are uneven, fix them.  If there are any pets around you, be aware of where they are.  Review your medicines with your doctor. Some medicines can make you feel dizzy. This can increase your chance of falling. Ask your doctor what other things that you can do to help prevent falls. This information is not intended to replace advice given to you by your health care provider. Make  sure you discuss any questions you have with your health care provider. Document Released: 01/10/2009 Document Revised: 08/22/2015 Document Reviewed: 04/20/2014 Elsevier Interactive Patient Education  2017 [employer].

## 2017-11-05 IMAGING — CT CT SHOULDER*L* W/O CM
1 series · 12 of 14 positions shown, 15 images · non-contrast
Comparison: None.

CLINICAL DATA: Status post fall 04/25/2016. Comminuted humeral head
fracture. Nonspecific (abnormal) findings on radiological and other
examination of musculoskeletal system.

EXAM:
CT OF THE UPPER LEFT EXTREMITY WITHOUT CONTRAST
3-DIMENSIONAL CT IMAGE RENDERING ON ACQUISITION WORKSTATION
TECHNIQUE: Multidetector CT imaging of the upper left extremity was performed
according to the standard protocol.
3-dimensional CT image rendering on acquisition workstation

[Series 7: ax st · axial · 0.39mm/px · z∈[-415,-262]mm · 12 of 97 slices shown, 15 images]
[im 8/97  soft-tissue]
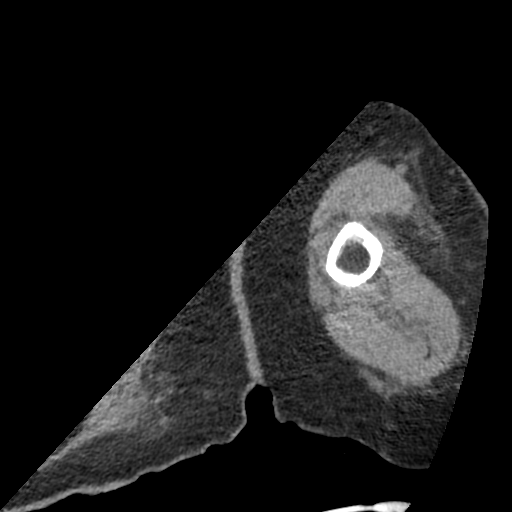
[im 8/97  bone]
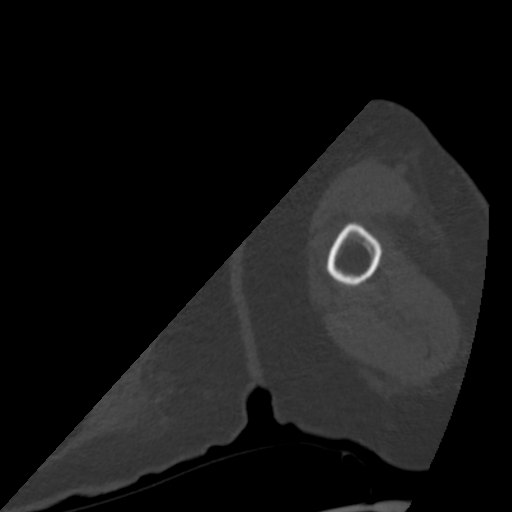
[im 15/97  bone]
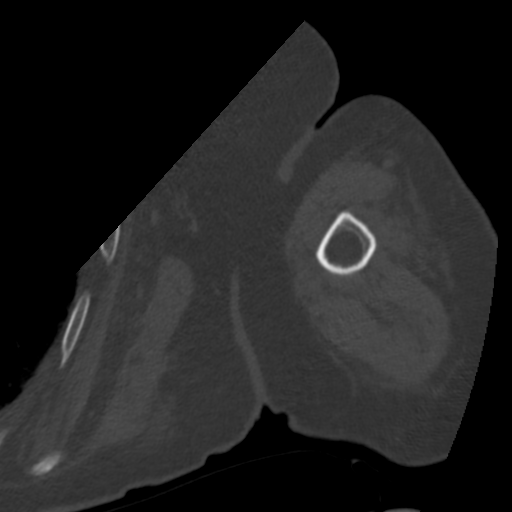
[im 23/97  bone]
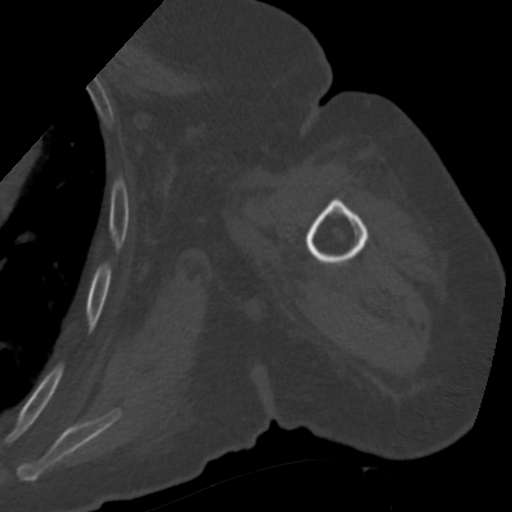
[im 30/97  bone]
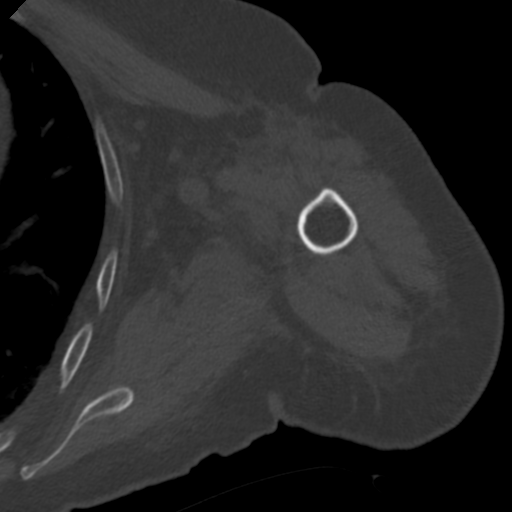
[im 37/97  soft-tissue]
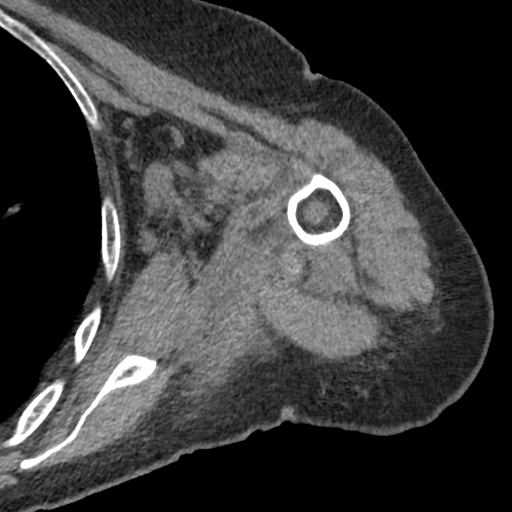
[im 37/97  bone]
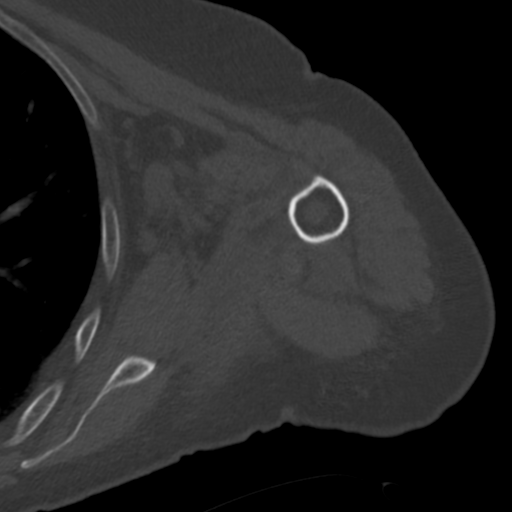
[im 45/97  bone]
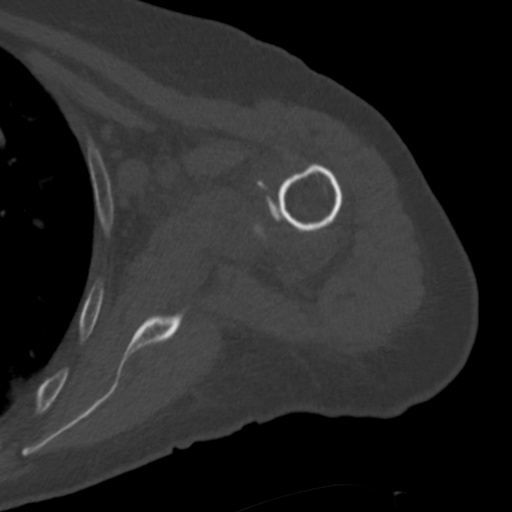
[im 52/97  bone]
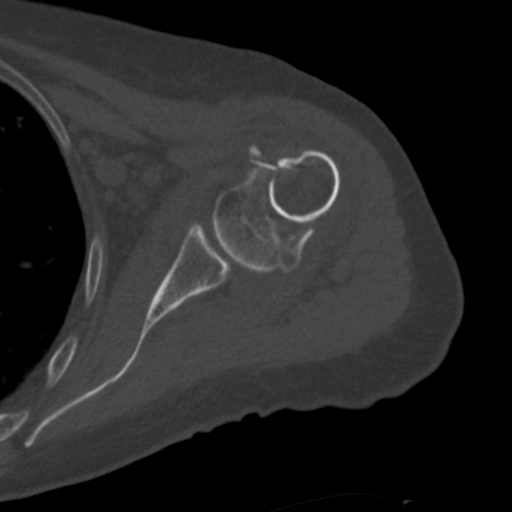
[im 60/97  bone]
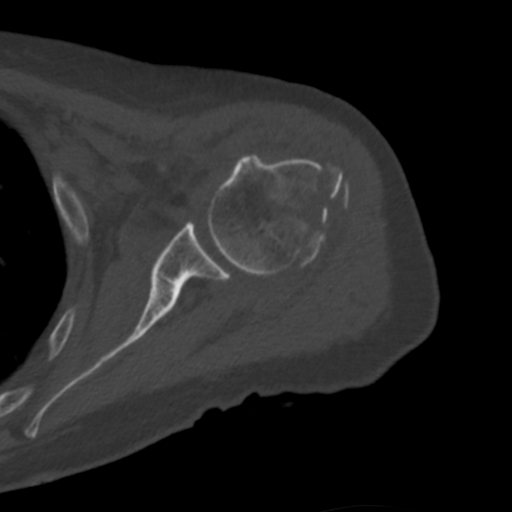
[im 67/97  soft-tissue]
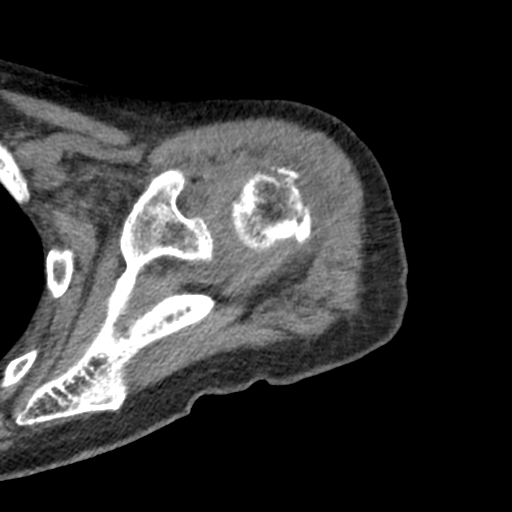
[im 67/97  bone]
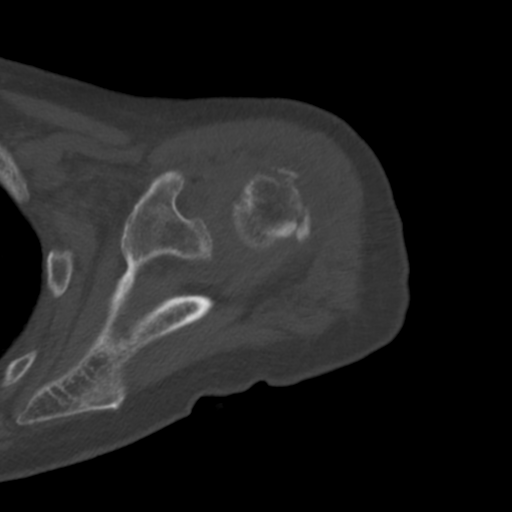
[im 74/97  bone]
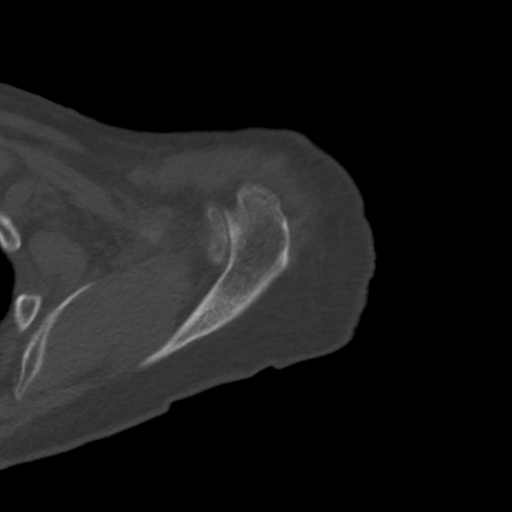
[im 82/97  bone]
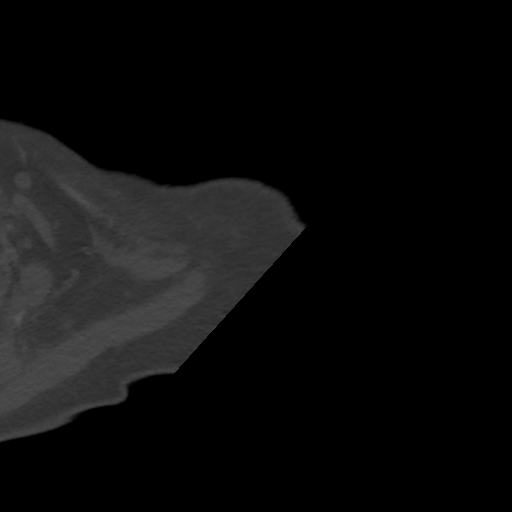
[im 89/97  bone]
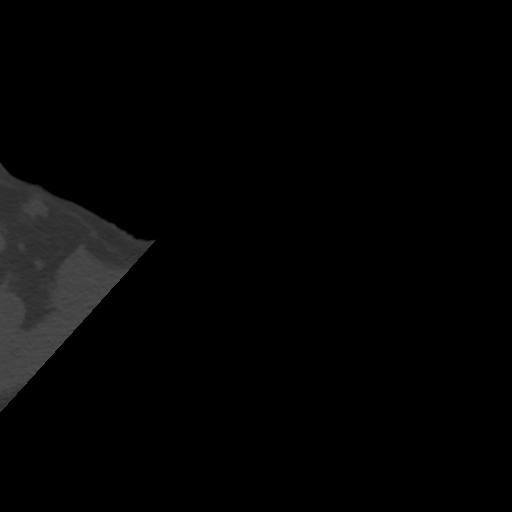

[12 of 14 positions shown; findings below may reference images not displayed]

FINDINGS: Bones/Joint/Cartilage

Comminuted fracture of the surgical neck of the left proximal
humerus with 15 mm of foreshortening. Fracture extends into the
greater tuberosity with comminution and 7 mm of displacement of the
anterior fractured fragment and 5 mm of displacement of the
posterior fracture fragment. Fracture cleft extends into the lesser
tuberosity without displacement. Fracture does not involve the
articular surface of the humeral head. 5 mm of lateral displacement
of the shaft relative to the humeral head.

No lytic or sclerotic osseous lesion. Mild arthropathy of the
acromioclavicular joint. Type I acromion. Small glenohumeral joint
effusion.

No other fracture or dislocation.

Ligaments

Suboptimally assessed by CT.

Muscles and Tendons

Muscles are normal.  No muscle atrophy.

Soft tissues

No fluid collection or hematoma.
IMPRESSION: Comminuted fracture of the surgical neck of the left proximal
humerus as described above.

## 2017-11-12 ENCOUNTER — Other Ambulatory Visit: Payer: Self-pay | Admitting: Cardiovascular Disease

## 2017-11-15 ENCOUNTER — Other Ambulatory Visit: Payer: Self-pay

## 2017-11-19 ENCOUNTER — Other Ambulatory Visit: Payer: Self-pay | Admitting: Cardiovascular Disease

## 2017-11-26 ENCOUNTER — Other Ambulatory Visit: Payer: Self-pay | Admitting: Cardiovascular Disease

## 2017-12-06 ENCOUNTER — Encounter: Payer: Self-pay | Admitting: Radiology

## 2017-12-06 ENCOUNTER — Emergency Department: Payer: Medicare Other

## 2017-12-06 ENCOUNTER — Ambulatory Visit (HOSPITAL_COMMUNITY)
Admission: AD | Admit: 2017-12-06 | Discharge: 2017-12-06 | Disposition: A | Discharge status: Home / Self Care | Payer: Medicare Other | Source: Other Acute Inpatient Hospital | Attending: Emergency Medicine | Admitting: Emergency Medicine

## 2017-12-06 ENCOUNTER — Emergency Department
Admission: EM | Admit: 2017-12-06 | Discharge: 2017-12-06 | Disposition: A | Discharge status: Other Acute Inpatient Hospital | Payer: Medicare Other | Attending: Emergency Medicine | Admitting: Emergency Medicine

## 2017-12-06 ENCOUNTER — Other Ambulatory Visit: Payer: Self-pay

## 2017-12-06 DIAGNOSIS — S0990XA Unspecified injury of head, initial encounter: Secondary | ICD-10-CM | POA: Diagnosis not present

## 2017-12-06 DIAGNOSIS — I700 Atherosclerosis of aorta: Secondary | ICD-10-CM | POA: Diagnosis not present

## 2017-12-06 DIAGNOSIS — Z7901 Long term (current) use of anticoagulants: Secondary | ICD-10-CM | POA: Diagnosis not present

## 2017-12-06 DIAGNOSIS — E872 Acidosis: Secondary | ICD-10-CM | POA: Diagnosis not present

## 2017-12-06 DIAGNOSIS — M7981 Nontraumatic hematoma of soft tissue: Secondary | ICD-10-CM | POA: Diagnosis not present

## 2017-12-06 DIAGNOSIS — X58XXXA Exposure to other specified factors, initial encounter: Secondary | ICD-10-CM | POA: Diagnosis not present

## 2017-12-06 DIAGNOSIS — S298XXA Other specified injuries of thorax, initial encounter: Secondary | ICD-10-CM | POA: Insufficient documentation

## 2017-12-06 DIAGNOSIS — T148XXA Other injury of unspecified body region, initial encounter: Secondary | ICD-10-CM | POA: Diagnosis not present

## 2017-12-06 DIAGNOSIS — I509 Heart failure, unspecified: Secondary | ICD-10-CM | POA: Diagnosis not present

## 2017-12-06 DIAGNOSIS — I480 Paroxysmal atrial fibrillation: Secondary | ICD-10-CM | POA: Diagnosis not present

## 2017-12-06 DIAGNOSIS — Y9389 Activity, other specified: Secondary | ICD-10-CM | POA: Insufficient documentation

## 2017-12-06 DIAGNOSIS — R296 Repeated falls: Secondary | ICD-10-CM | POA: Diagnosis not present

## 2017-12-06 DIAGNOSIS — I495 Sick sinus syndrome: Secondary | ICD-10-CM | POA: Diagnosis not present

## 2017-12-06 DIAGNOSIS — S299XXA Unspecified injury of thorax, initial encounter: Secondary | ICD-10-CM | POA: Diagnosis not present

## 2017-12-06 DIAGNOSIS — S199XXA Unspecified injury of neck, initial encounter: Secondary | ICD-10-CM | POA: Diagnosis not present

## 2017-12-06 DIAGNOSIS — I4891 Unspecified atrial fibrillation: Secondary | ICD-10-CM | POA: Insufficient documentation

## 2017-12-06 DIAGNOSIS — R1901 Right upper quadrant abdominal swelling, mass and lump: Secondary | ICD-10-CM | POA: Diagnosis not present

## 2017-12-06 DIAGNOSIS — Y999 Unspecified external cause status: Secondary | ICD-10-CM | POA: Insufficient documentation

## 2017-12-06 DIAGNOSIS — I517 Cardiomegaly: Secondary | ICD-10-CM | POA: Diagnosis not present

## 2017-12-06 DIAGNOSIS — Z79899 Other long term (current) drug therapy: Secondary | ICD-10-CM | POA: Diagnosis not present

## 2017-12-06 DIAGNOSIS — R58 Hemorrhage, not elsewhere classified: Secondary | ICD-10-CM

## 2017-12-06 DIAGNOSIS — S42302D Unspecified fracture of shaft of humerus, left arm, subsequent encounter for fracture with routine healing: Secondary | ICD-10-CM | POA: Diagnosis not present

## 2017-12-06 DIAGNOSIS — T1490XA Injury, unspecified, initial encounter: Secondary | ICD-10-CM | POA: Diagnosis not present

## 2017-12-06 DIAGNOSIS — S42202A Unspecified fracture of upper end of left humerus, initial encounter for closed fracture: Secondary | ICD-10-CM | POA: Diagnosis not present

## 2017-12-06 DIAGNOSIS — I959 Hypotension, unspecified: Secondary | ICD-10-CM | POA: Diagnosis not present

## 2017-12-06 DIAGNOSIS — R001 Bradycardia, unspecified: Secondary | ICD-10-CM | POA: Diagnosis not present

## 2017-12-06 DIAGNOSIS — L821 Other seborrheic keratosis: Secondary | ICD-10-CM | POA: Diagnosis not present

## 2017-12-06 DIAGNOSIS — M542 Cervicalgia: Secondary | ICD-10-CM | POA: Diagnosis not present

## 2017-12-06 DIAGNOSIS — R197 Diarrhea, unspecified: Secondary | ICD-10-CM | POA: Diagnosis not present

## 2017-12-06 DIAGNOSIS — K449 Diaphragmatic hernia without obstruction or gangrene: Secondary | ICD-10-CM | POA: Diagnosis not present

## 2017-12-06 DIAGNOSIS — I10 Essential (primary) hypertension: Secondary | ICD-10-CM | POA: Insufficient documentation

## 2017-12-06 DIAGNOSIS — M419 Scoliosis, unspecified: Secondary | ICD-10-CM | POA: Diagnosis not present

## 2017-12-06 DIAGNOSIS — G8911 Acute pain due to trauma: Secondary | ICD-10-CM | POA: Diagnosis not present

## 2017-12-06 DIAGNOSIS — E119 Type 2 diabetes mellitus without complications: Secondary | ICD-10-CM | POA: Diagnosis not present

## 2017-12-06 DIAGNOSIS — S3993XA Unspecified injury of pelvis, initial encounter: Secondary | ICD-10-CM | POA: Diagnosis not present

## 2017-12-06 DIAGNOSIS — J939 Pneumothorax, unspecified: Secondary | ICD-10-CM | POA: Diagnosis not present

## 2017-12-06 DIAGNOSIS — M47816 Spondylosis without myelopathy or radiculopathy, lumbar region: Secondary | ICD-10-CM | POA: Diagnosis not present

## 2017-12-06 DIAGNOSIS — R000 Tachycardia, unspecified: Secondary | ICD-10-CM | POA: Diagnosis not present

## 2017-12-06 DIAGNOSIS — Y92018 Other place in single-family (private) house as the place of occurrence of the external cause: Secondary | ICD-10-CM | POA: Insufficient documentation

## 2017-12-06 DIAGNOSIS — E1140 Type 2 diabetes mellitus with diabetic neuropathy, unspecified: Secondary | ICD-10-CM | POA: Insufficient documentation

## 2017-12-06 DIAGNOSIS — S301XXA Contusion of abdominal wall, initial encounter: Secondary | ICD-10-CM | POA: Diagnosis not present

## 2017-12-06 DIAGNOSIS — I4892 Unspecified atrial flutter: Secondary | ICD-10-CM | POA: Diagnosis not present

## 2017-12-06 DIAGNOSIS — Z515 Encounter for palliative care: Secondary | ICD-10-CM | POA: Diagnosis not present

## 2017-12-06 DIAGNOSIS — E042 Nontoxic multinodular goiter: Secondary | ICD-10-CM | POA: Diagnosis not present

## 2017-12-06 DIAGNOSIS — S300XXA Contusion of lower back and pelvis, initial encounter: Secondary | ICD-10-CM | POA: Diagnosis not present

## 2017-12-06 DIAGNOSIS — E1165 Type 2 diabetes mellitus with hyperglycemia: Secondary | ICD-10-CM | POA: Diagnosis not present

## 2017-12-06 DIAGNOSIS — M5136 Other intervertebral disc degeneration, lumbar region: Secondary | ICD-10-CM | POA: Diagnosis not present

## 2017-12-06 DIAGNOSIS — R1110 Vomiting, unspecified: Secondary | ICD-10-CM | POA: Diagnosis not present

## 2017-12-06 DIAGNOSIS — H409 Unspecified glaucoma: Secondary | ICD-10-CM | POA: Diagnosis not present

## 2017-12-06 DIAGNOSIS — S42302A Unspecified fracture of shaft of humerus, left arm, initial encounter for closed fracture: Secondary | ICD-10-CM | POA: Diagnosis not present

## 2017-12-06 DIAGNOSIS — E785 Hyperlipidemia, unspecified: Secondary | ICD-10-CM | POA: Diagnosis not present

## 2017-12-06 DIAGNOSIS — I081 Rheumatic disorders of both mitral and tricuspid valves: Secondary | ICD-10-CM | POA: Diagnosis not present

## 2017-12-06 DIAGNOSIS — N2889 Other specified disorders of kidney and ureter: Secondary | ICD-10-CM | POA: Diagnosis not present

## 2017-12-06 DIAGNOSIS — Z452 Encounter for adjustment and management of vascular access device: Secondary | ICD-10-CM | POA: Diagnosis not present

## 2017-12-06 DIAGNOSIS — W19XXXD Unspecified fall, subsequent encounter: Secondary | ICD-10-CM | POA: Diagnosis not present

## 2017-12-06 DIAGNOSIS — W19XXXA Unspecified fall, initial encounter: Secondary | ICD-10-CM | POA: Diagnosis not present

## 2017-12-06 DIAGNOSIS — K5730 Diverticulosis of large intestine without perforation or abscess without bleeding: Secondary | ICD-10-CM | POA: Diagnosis not present

## 2017-12-06 DIAGNOSIS — M6281 Muscle weakness (generalized): Secondary | ICD-10-CM | POA: Diagnosis not present

## 2017-12-06 DIAGNOSIS — T461X5A Adverse effect of calcium-channel blockers, initial encounter: Secondary | ICD-10-CM | POA: Diagnosis not present

## 2017-12-06 DIAGNOSIS — R918 Other nonspecific abnormal finding of lung field: Secondary | ICD-10-CM | POA: Diagnosis not present

## 2017-12-06 DIAGNOSIS — R5381 Other malaise: Secondary | ICD-10-CM | POA: Diagnosis not present

## 2017-12-06 DIAGNOSIS — S42292A Other displaced fracture of upper end of left humerus, initial encounter for closed fracture: Secondary | ICD-10-CM | POA: Diagnosis not present

## 2017-12-06 DIAGNOSIS — R0989 Other specified symptoms and signs involving the circulatory and respiratory systems: Secondary | ICD-10-CM | POA: Diagnosis not present

## 2017-12-06 DIAGNOSIS — D689 Coagulation defect, unspecified: Secondary | ICD-10-CM | POA: Diagnosis not present

## 2017-12-06 DIAGNOSIS — S42212A Unspecified displaced fracture of surgical neck of left humerus, initial encounter for closed fracture: Secondary | ICD-10-CM | POA: Diagnosis not present

## 2017-12-06 DIAGNOSIS — R279 Unspecified lack of coordination: Secondary | ICD-10-CM | POA: Diagnosis not present

## 2017-12-06 DIAGNOSIS — R110 Nausea: Secondary | ICD-10-CM | POA: Diagnosis not present

## 2017-12-06 DIAGNOSIS — K5903 Drug induced constipation: Secondary | ICD-10-CM | POA: Diagnosis not present

## 2017-12-06 DIAGNOSIS — N6320 Unspecified lump in the left breast, unspecified quadrant: Secondary | ICD-10-CM | POA: Diagnosis not present

## 2017-12-06 DIAGNOSIS — E878 Other disorders of electrolyte and fluid balance, not elsewhere classified: Secondary | ICD-10-CM | POA: Diagnosis not present

## 2017-12-06 DIAGNOSIS — Z9181 History of falling: Secondary | ICD-10-CM | POA: Diagnosis not present

## 2017-12-06 DIAGNOSIS — I110 Hypertensive heart disease with heart failure: Secondary | ICD-10-CM | POA: Diagnosis not present

## 2017-12-06 DIAGNOSIS — Z794 Long term (current) use of insulin: Secondary | ICD-10-CM | POA: Diagnosis not present

## 2017-12-06 LAB — COMPREHENSIVE METABOLIC PANEL
ALT: 22 U/L (ref 0–44)
AST: 33 U/L (ref 15–41)
Albumin: 3.4 g/dL — ABNORMAL LOW (ref 3.5–5.0)
Alkaline Phosphatase: 57 U/L (ref 38–126)
Anion gap: 13 (ref 5–15)
BILIRUBIN TOTAL: 1.4 mg/dL — AB (ref 0.3–1.2)
BUN: 16 mg/dL (ref 8–23)
CO2: 20 mmol/L — ABNORMAL LOW (ref 22–32)
Calcium: 8.7 mg/dL — ABNORMAL LOW (ref 8.9–10.3)
Chloride: 100 mmol/L (ref 98–111)
Creatinine, Ser: 1.1 mg/dL — ABNORMAL HIGH (ref 0.44–1.00)
GFR calc Af Amer: 52 mL/min — ABNORMAL LOW (ref >60)
GFR, EST NON AFRICAN AMERICAN: 45 mL/min — AB (ref >60)
Glucose, Bld: 659 mg/dL (ref 70–99)
POTASSIUM: 4.6 mmol/L (ref 3.5–5.1)
Sodium: 133 mmol/L — ABNORMAL LOW (ref 135–145)
TOTAL PROTEIN: 6.1 g/dL — AB (ref 6.5–8.1)

## 2017-12-06 LAB — CBC WITH DIFFERENTIAL/PLATELET
BASOS ABS: 0 10*3/uL (ref 0–0.1)
BASOS PCT: 0 %
EOS ABS: 0.1 10*3/uL (ref 0–0.7)
EOS PCT: 1 %
HCT: 34.5 % — ABNORMAL LOW (ref 35.0–47.0)
Hemoglobin: 11.4 g/dL — ABNORMAL LOW (ref 12.0–16.0)
Lymphocytes Relative: 22 %
Lymphs Abs: 1.5 10*3/uL (ref 1.0–3.6)
MCH: 31.7 pg (ref 26.0–34.0)
MCHC: 33.1 g/dL (ref 32.0–36.0)
MCV: 95.8 fL (ref 80.0–100.0)
Monocytes Absolute: 0.6 10*3/uL (ref 0.2–0.9)
Monocytes Relative: 9 %
Neutro Abs: 4.7 10*3/uL (ref 1.4–6.5)
Neutrophils Relative %: 68 %
PLATELETS: 155 10*3/uL (ref 150–440)
RBC: 3.6 MIL/uL — AB (ref 3.80–5.20)
RDW: 14.9 % — ABNORMAL HIGH (ref 11.5–14.5)
WBC: 6.9 10*3/uL (ref 3.6–11.0)

## 2017-12-06 LAB — GLUCOSE, CAPILLARY
GLUCOSE-CAPILLARY: 416 mg/dL — AB (ref 70–99)
GLUCOSE-CAPILLARY: 526 mg/dL — AB (ref 70–99)
Glucose-Capillary: 404 mg/dL — ABNORMAL HIGH (ref 70–99)

## 2017-12-06 LAB — URINALYSIS, COMPLETE (UACMP) WITH MICROSCOPIC
BILIRUBIN URINE: NEGATIVE
Bacteria, UA: NONE SEEN
Glucose, UA: >=500 mg/dL — AB
Hgb urine dipstick: NEGATIVE
Ketones, ur: NEGATIVE mg/dL
LEUKOCYTES UA: NEGATIVE
NITRITE: NEGATIVE
PH: 6 (ref 5.0–8.0)
PROTEIN: NEGATIVE mg/dL
SPECIFIC GRAVITY, URINE: 1.042 — AB (ref 1.005–1.030)

## 2017-12-06 LAB — TROPONIN I: Troponin I: <0.03 ng/mL (ref <0.03)

## 2017-12-06 LAB — TYPE AND SCREEN
ABO/RH(D): A POS
Antibody Screen: NEGATIVE

## 2017-12-06 LAB — TSH: TSH: 5.897 u[IU]/mL — ABNORMAL HIGH (ref 0.350–4.500)

## 2017-12-06 LAB — DIGOXIN LEVEL

## 2017-12-06 LAB — T4, FREE: Free T4: 1.15 ng/dL (ref 0.82–1.77)

## 2017-12-06 LAB — BETA-HYDROXYBUTYRIC ACID: Beta-Hydroxybutyric Acid: 0.38 mmol/L — ABNORMAL HIGH (ref 0.05–0.27)

## 2017-12-06 MED ORDER — ONDANSETRON HCL 4 MG/2ML IJ SOLN
4.0000 mg | Freq: Once | INTRAMUSCULAR | Status: AC
  Administered 2017-12-06: 4 mg via INTRAVENOUS
  Filled 2017-12-06: qty 2

## 2017-12-06 MED ORDER — SODIUM CHLORIDE 0.9 % IV SOLN
100.00 | INTRAVENOUS | Status: DC
Start: ? — End: 2017-12-06

## 2017-12-06 MED ORDER — EMPTY CONTAINERS FLEXIBLE MISC
900.0000 mg | Freq: Once | Status: AC
  Administered 2017-12-06: 900 mg via INTRAVENOUS
  Filled 2017-12-06: qty 90

## 2017-12-06 MED ORDER — TIMOLOL MALEATE 0.5 % OP SOLN
1.00 | OPHTHALMIC | Status: DC
Start: 2017-12-15 — End: 2017-12-06

## 2017-12-06 MED ORDER — DEXTROSE 10 % IV SOLN
INTRAVENOUS | Status: DC
  Administered 2017-12-06: 07:00:00 via INTRAVENOUS

## 2017-12-06 MED ORDER — SODIUM CHLORIDE 0.9 % IV BOLUS
1000.0000 mL | Freq: Once | INTRAVENOUS | Status: AC
  Administered 2017-12-06: 1000 mL via INTRAVENOUS

## 2017-12-06 MED ORDER — IOPAMIDOL (ISOVUE-300) INJECTION 61%
75.0000 mL | Freq: Once | INTRAVENOUS | Status: AC | PRN
  Administered 2017-12-06: 75 mL via INTRAVENOUS

## 2017-12-06 MED ORDER — ONDANSETRON HCL 4 MG/2ML IJ SOLN
INTRAMUSCULAR | Status: AC
  Administered 2017-12-06: 4 mg via INTRAVENOUS
  Filled 2017-12-06: qty 2

## 2017-12-06 MED ORDER — ATORVASTATIN CALCIUM 20 MG PO TABS
20.00 | ORAL_TABLET | ORAL | Status: DC
Start: 2017-12-14 — End: 2017-12-06

## 2017-12-06 MED ORDER — NOREPINEPHRINE 4 MG/250ML-% IV SOLN
0.0000 ug/min | Freq: Once | INTRAVENOUS | Status: AC
  Administered 2017-12-06: 2 ug/min via INTRAVENOUS
  Filled 2017-12-06: qty 250

## 2017-12-06 MED ORDER — MAGNESIUM SULFATE 2 GM/50ML IV SOLN
2.0000 g | Freq: Once | INTRAVENOUS | Status: AC
  Administered 2017-12-06: 2 g via INTRAVENOUS

## 2017-12-06 MED ORDER — DEXTROSE 10 % IV SOLN
12.50 | INTRAVENOUS | Status: DC
Start: ? — End: 2017-12-06

## 2017-12-06 MED ORDER — SODIUM CHLORIDE 0.9 % IV SOLN
1.0000 g | Freq: Once | INTRAVENOUS | Status: AC
  Administered 2017-12-06: 1 g via INTRAVENOUS
  Filled 2017-12-06: qty 10

## 2017-12-06 MED ORDER — SODIUM CHLORIDE 0.9 % IV BOLUS
500.0000 mL | Freq: Once | INTRAVENOUS | Status: AC
  Administered 2017-12-06: 500 mL via INTRAVENOUS

## 2017-12-06 MED ORDER — INSULIN REGULAR HUMAN 100 UNIT/ML IJ SOLN
.00 | INTRAMUSCULAR | Status: DC
Start: 2017-12-08 — End: 2017-12-06

## 2017-12-06 MED ORDER — GENERIC EXTERNAL MEDICATION
0.00 | Status: DC
Start: ? — End: 2017-12-06

## 2017-12-06 MED ORDER — DEXTROSE-NACL 5-0.9 % IV SOLN
50.00 | INTRAVENOUS | Status: DC
Start: ? — End: 2017-12-06

## 2017-12-06 MED ORDER — ONDANSETRON HCL 4 MG/2ML IJ SOLN
4.0000 mg | Freq: Once | INTRAMUSCULAR | Status: AC
  Administered 2017-12-06: 4 mg via INTRAVENOUS

## 2017-12-06 MED ORDER — MAGNESIUM SULFATE 2 GM/50ML IV SOLN: 2.0000 g | Freq: Once | INTRAVENOUS | Status: DC

## 2017-12-06 MED ORDER — GLUCAGON HCL RDNA (DIAGNOSTIC) 1 MG IJ SOLR
5.0000 mg | Freq: Once | INTRAMUSCULAR | Status: AC
  Administered 2017-12-06: 5 mg via INTRAVENOUS
  Filled 2017-12-06: qty 5

## 2017-12-06 MED ORDER — SODIUM CHLORIDE 0.9 % IV SOLN
60.0000 [IU]/h | INTRAVENOUS | Status: DC
  Administered 2017-12-06: 60 [IU]/h via INTRAVENOUS
  Filled 2017-12-06 (×2): qty 1

## 2017-12-06 MED ORDER — ACETAMINOPHEN 325 MG PO TABS
650.00 | ORAL_TABLET | ORAL | Status: DC
Start: ? — End: 2017-12-06

## 2017-12-06 MED ORDER — GENERIC EXTERNAL MEDICATION
1.00 | Status: DC
Start: 2017-12-14 — End: 2017-12-06

## 2017-12-06 MED ORDER — ISOPROTERENOL HCL 0.2 MG/ML IJ SOLN: 2.0000 ug/min | Freq: Once | INTRAVENOUS | Status: DC

## 2017-12-06 MED ORDER — MAGNESIUM SULFATE 2 GM/50ML IV SOLN
2.0000 g | Freq: Once | INTRAVENOUS | Status: AC
  Administered 2017-12-06: 2 g via INTRAVENOUS
  Filled 2017-12-06: qty 50

## 2017-12-06 NOTE — ED Notes (Signed)
EMTALA reviewed by charge RN 

## 2017-12-06 NOTE — ED Provider Notes (Addendum)
[facility] Emergency Department Provider Note  ____________________________________________   First MD Initiated Contact with Patient 12/06/17 0502     (approximate)  I have reviewed the triage vital signs and the nursing notes.   HISTORY  Chief Complaint Fall   HPI Amber Wolfe is a 84 y.o. female who comes to the emergency department via EMS after a mechanical fall this morning at home.  When EMS arrived they noted that the patient was sitting on the floor with an old appearing ecchymosis to her left lateral chest.  According to the patient she has been falling "a lot" for the past several days.  She said "my legs just give out".  Her symptoms are worse when standing up and somewhat improved when sitting down.  She does have a past medical history of atrial fibrillation for which she takes amiodarone as well as Xarelto.  She takes Norvasc and lisinopril for blood pressure.  She is primarily reporting moderate severity sudden onset nonradiating right hip pain.  She has been unable to bear weight.    Past Medical History:  Diagnosis Date  . Arthritis    In hands  . Atrial flutter ([place])    a. s/p successful TEE/DCCV 07/31/2015; b. on Xarelto; c. CHADS2VASc => 6 (CHF, HTN, age x 2, DM, female)  . Cataract   . Chronic systolic CHF (congestive heart failure) ([place])    a. echo 06/2015: EF 40%, basal HK, nl WM of mid and apical segments, mild to mod MR/TR; b. TEE 12/04/7891: nl LV systolic function, mild to mod MR, trivial TR  . Diabetes mellitus without complication ([place])    Type II  . GERD (gastroesophageal reflux disease)   . Glaucoma    Right Eye  . Hyperlipidemia   . Hypertension   . Moderate mitral regurgitation    a. see echo from 06/2015 and TEE 07/2015  . PAF (paroxysmal atrial fibrillation) ([place])    a.  01/2016 Admitted to [facility] with PAF -->amio added.    Patient Active Problem List   Diagnosis Date Noted  . Osteoporosis 12/17/2016  . Bradycardia  12/17/2016  . Microalbuminuria 06/17/2016  . Typical atrial flutter ([place])   . SOB (shortness of breath)   . Palpitations 02/17/2016  . Mitral regurgitation 02/17/2016  . Left calcaneal bursitis 09/19/2015  . Atypical atrial flutter ([place]) 06/18/2015  . Hyperlipidemia 06/18/2015  . Primary osteoarthritis of right knee 05/20/2015  . Breast cancer screening 05/20/2015  . Diabetes mellitus with neuropathy ([place]) 05/15/2015  . Essential hypertension, benign 05/15/2015  . Medication monitoring encounter 05/15/2015  . Osteoarthritis of both hands 05/15/2015  . Decreased hearing of both ears 05/15/2015  . Atrial fibrillation ([place]) 05/15/2015  . Fracture of distal radius and ulna, left, closed, initial encounter 04/25/2014    Past Surgical History:  Procedure Laterality Date  . CATARACT EXTRACTION Bilateral   . CESAREAN SECTION    . ELECTROPHYSIOLOGIC STUDY N/A 07/31/2015   Procedure: CARDIOVERSION;  Surgeon: Wende Bushy, MD;  Location: [facility] ORS;  Service: Cardiovascular;  Laterality: N/A;  . ELECTROPHYSIOLOGIC STUDY N/A 04/03/2016   Procedure: CARDIOVERSION;  Surgeon: Minna Merritts, MD;  Location: [facility] ORS;  Service: Cardiovascular;  Laterality: N/A;  . TEE WITHOUT CARDIOVERSION N/A 07/31/2015   Procedure: TRANSESOPHAGEAL ECHOCARDIOGRAM (TEE);  Surgeon: Wende Bushy, MD;  Location: [facility] ORS;  Service: Cardiovascular;  Laterality: N/A;  . WISDOM TOOTH EXTRACTION    . Wrist Surgery Left     Prior to Admission medications  Medication Sig Start Date End Date Taking? Authorizing Provider  acetaminophen (TYLENOL) 500 MG tablet Take 500-1,000 mg by mouth daily as needed for moderate pain or headache.   Yes [provider]  alendronate (FOSAMAX) 70 MG tablet Take 1 tablet (70 mg total) by mouth every 7 (seven) days. Take with a full glass of water on an empty stomach. 06/17/17  Yes Lada, Satira Anis, MD  amiodarone (PACERONE) 200 MG tablet Take 0.5 tablets (100 mg total) by mouth daily.  11/26/17  Yes Wellington Hampshire, MD  atorvastatin (LIPITOR) 20 MG tablet TAKE 1 TABLET BY MOUTH AT BEDTIME 07/17/17  Yes Lada, Satira Anis, MD  Blood Pressure KIT Check blood pressure daily or as needed if tired or fatigued; dx I10 12/17/16  Yes Lada, Satira Anis, MD  furosemide (LASIX) 20 MG tablet Take 1 tablet (20 mg total) by mouth daily. 09/03/17  Yes Wellington Hampshire, MD  latanoprost (XALATAN) 0.005 % ophthalmic solution Place 1 drop into both eyes at bedtime.    Yes [provider]  lisinopril (PRINIVIL,ZESTRIL) 40 MG tablet Take 40 mg by mouth daily.   Yes [provider]  timolol (TIMOPTIC) 0.5 % ophthalmic solution Place 1 drop into both eyes at bedtime.  02/11/15  Yes [provider]  XARELTO 20 MG TABS tablet TAKE ONE (1) TABLET EACH DAY WITH SUPPER Patient taking differently: Take 20 mg by mouth daily with supper.  10/01/17  Yes Wellington Hampshire, MD  amiodarone (PACERONE) 200 MG tablet Take one-half tablet by mouth daily. Patient not taking: Reported on 12/06/2017 11/15/17   Wellington Hampshire, MD  amLODipine (NORVASC) 10 MG tablet Take 1 tablet (10 mg total) by mouth daily. 05/27/17 11/02/17  Rise Mu, PA-C  lisinopril (PRINIVIL,ZESTRIL) 40 MG tablet Take 1 tablet (40 mg total) by mouth daily. 07/06/17 11/02/17  Rise Mu, PA-C  traMADol Veatrice Bourbon) 50 MG tablet Take 1-2 tablets by mouth every 6 hours as needed for moderate to severe pain Patient not taking: Reported on 12/06/2017 08/12/17   Hinda Kehr, MD    Allergies Patient has no known allergies.  Family History  Problem Relation Age of Onset  . Cataracts Mother   . Heart attack Mother   . Heart attack Father   . Heart attack Brother   . Aneurysm Daughter   . Heart attack Maternal Grandfather   . Heart attack Paternal Grandfather     Social History Social History   Tobacco Use  . Smoking status: Never Smoker  . Smokeless tobacco: Never Used  . Tobacco comment: smoking cessation materials not required    Substance Use Topics  . Alcohol use: No  . Drug use: No    Review of Systems Constitutional: No fever/chills Eyes: No visual changes. ENT: No sore throat. Cardiovascular: Positive for chest pain. Respiratory: Denies shortness of breath. Gastrointestinal: As of her hip and flank pain.  Positive for nausea positive for vomiting Genitourinary: Negative for dysuria. Musculoskeletal: Positive for back pain. Skin: Negative for rash. Neurological: Negative for headaches, focal weakness or numbness.   ____________________________________________   PHYSICAL EXAM:  VITAL SIGNS: ED Triage Vitals  Enc Vitals Group     BP      Pulse      Resp      Temp      Temp src      SpO2      Weight      Height      Head Circumference  Peak Flow      Pain Score      Pain Loc      Pain Edu?      Excl. in [place]?     Constitutional: Alert and oriented x4 appears uncomfortable in her cervical collar retching Eyes: PERRL EOMI. midrange and brisk Head: Atraumatic. Nose: No congestion/rhinnorhea. Mouth/Throat: No trismus Neck: No stridor.  No midline tenderness or step-offs.  Cervical collar in place Cardiovascular: Bradycardic rate, regular rhythm. Grossly normal heart sounds.  Good peripheral circulation.  She is tender over her left lateral chest although no crepitus Respiratory: Increased respiratory effort.  No retractions. Lungs CTAB and moving good air Gastrointestinal: She is somewhat tender over her right lower quadrant and right flank although no frank peritonitis Musculoskeletal: No lower extremity edema   Neurologic:  Normal speech and language. No gross focal neurologic deficits are appreciated. Skin: Large old appearing ecchymosis to left breast and left upper chest Psychiatric: Mood and affect are normal. Speech and behavior are normal.    ____________________________________________   DIFFERENTIAL includes but not limited to  Hyperkalemia, acute coronary syndrome,  therapeutic misadventure, suicide attempt, intracerebral hemorrhage, cervical spine fracture ____________________________________________   LABS (all labs ordered are listed, but only abnormal results are displayed)  Labs Reviewed  COMPREHENSIVE METABOLIC PANEL - Abnormal; Notable for the following components:      Result Value   Sodium 133 (*)    CO2 20 (*)    Glucose, Bld 659 (*)    Creatinine, Ser 1.10 (*)    Calcium 8.7 (*)    Total Protein 6.1 (*)    Albumin 3.4 (*)    Total Bilirubin 1.4 (*)    GFR calc non Af Amer 45 (*)    GFR calc Af Amer 52 (*)    All other components within normal limits  CBC WITH DIFFERENTIAL/PLATELET - Abnormal; Notable for the following components:   RBC 3.60 (*)    Hemoglobin 11.4 (*)    HCT 34.5 (*)    RDW 14.9 (*)    All other components within normal limits  URINALYSIS, COMPLETE (UACMP) WITH MICROSCOPIC - Abnormal; Notable for the following components:   Color, Urine YELLOW (*)    APPearance CLEAR (*)    Specific Gravity, Urine 1.042 (*)    Glucose, UA >=500 (*)    All other components within normal limits  DIGOXIN LEVEL - Abnormal; Notable for the following components:   Digoxin Level <0.2 (*)    All other components within normal limits  TSH - Abnormal; Notable for the following components:   TSH 5.897 (*)    All other components within normal limits  BETA-HYDROXYBUTYRIC ACID - Abnormal; Notable for the following components:   Beta-Hydroxybutyric Acid 0.38 (*)    All other components within normal limits  TROPONIN I  T4, FREE  TYPE AND SCREEN    Lab work reviewed by me with high specific gravity concerning for dehydration.  No evidence of DKA.  CK is normal.  She does have some acute kidney injury __________________________________________  EKG  ED ECG REPORT I, Darel Hong, the attending physician, personally viewed and interpreted this ECG.  Date: 12/06/2017 EKG Time: 0504 Rate: 40 Rhythm: Wavy baseline makes  interpretation difficult.  It is narrow complex and is difficult to say if this is a junctional rhythm versus ectopic atrial rhythm QRS Axis: Leftward axis Intervals: normal ST/T Wave abnormalities: Inferior T wave inversion but no ST elevation Narrative Interpretation: Narrow complex irregular bradycardia with  no obvious ST elevation  ED ECG REPORT I, Darel Hong, the attending physician, personally viewed and interpreted this ECG.  Date: 12/06/2017 EKG Time: 0513 Rate: 67 Rhythm: normal sinus rhythm QRS Axis: Leftward axis Intervals: First-degree AV block ST/T Wave abnormalities: normal Narrative Interpretation: no evidence of acute ischemia  ____________________________________________  RADIOLOGY  Pan scan reviewed by me largely negative aside from large right flank hematoma with active extravasation.  She also has a lesion which is concerning for malignancy ____________________________________________   PROCEDURES  Procedure(s) performed: Yes  .Critical Care Performed by: Darel Hong, MD Authorized by: Darel Hong, MD   Critical care provider statement:    Critical care time (minutes):  125   Critical care time was exclusive of:  Separately billable procedures and treating other patients   Critical care was necessary to treat or prevent imminent or life-threatening deterioration of the following conditions:  Toxidrome   Critical care was time spent personally by me on the following activities:  Development of treatment plan with patient or surrogate, discussions with consultants, evaluation of patient's response to treatment, examination of patient, obtaining history from patient or surrogate, ordering and performing treatments and interventions, ordering and review of laboratory studies, ordering and review of radiographic studies, pulse oximetry, re-evaluation of patient's condition and review of old charts .Central Line Date/Time: 12/06/2017 7:35 AM Performed  by: Darel Hong, MD Authorized by: Darel Hong, MD   Consent:    Consent obtained:  Emergent situation and verbal   Consent given by:  Patient   Risks discussed:  Incorrect placement, infection, pneumothorax, bleeding and arterial puncture   Alternatives discussed:  Alternative treatment Pre-procedure details:    Hand hygiene: Hand hygiene performed prior to insertion     Sterile barrier technique: All elements of maximal sterile technique followed     Skin preparation:  2% chlorhexidine   Skin preparation agent: Skin preparation agent completely dried prior to procedure   Anesthesia (see MAR for exact dosages):    Anesthesia method:  Local infiltration   Local anesthetic:  Lidocaine 1% w/o epi Procedure details:    Location:  L internal jugular   Patient position:  Trendelenburg   Procedural supplies:  Triple lumen   Landmarks identified: yes     Ultrasound guidance: yes     Sterile ultrasound techniques: Sterile gel and sterile probe covers were used     Number of attempts:  1   Successful placement: yes   Post-procedure details:    Post-procedure:  Dressing applied and line sutured   Assessment:  Blood return through all ports and free fluid flow   Patient tolerance of procedure:  Tolerated well, no immediate complications    Critical Care performed: Yes  ____________________________________________   INITIAL IMPRESSION / ASSESSMENT AND PLAN / ED COURSE  Pertinent labs & imaging results that were available during my care of the patient were reviewed by me and considered in my medical decision making (see chart for details).   As part of my medical decision making, I reviewed the following data within the [place] History obtained from family if available, nursing notes, old chart and ekg, as well as notes from prior ED visits.  The patient arrives to the emergency department critically ill-appearing.  She is bradycardic to the high 30s  lightheaded nauseated and actively vomiting.  I reviewed the prehospital EKG which showed regularized atrial fibrillation in the high 30s.  Asked the patient if she took digoxin and she was unaware  however on chart review she takes amiodarone and Norvasc as her only cardiac medications along with rivaroxaban.  While sitting with me in the emergency department she began to retch and vomit and bradycardia down to the mid 20s.  After vomiting stopped her heart rate went to about 60.  She never passed out.  I gave her 0.5 mg of atropine with no effect.  At this point differential includes acute coronary syndrome, amiodarone versus calcium channel blocker toxicity, hyperkalemia, and of course of traumatic injury.  We will take her emergently for a pan scan without renal function given my high clinical suspicion for a life-threatening traumatic injury.  ----------------------------------------- 5:52 AM on 12/06/2017 -----------------------------------------  My wet read of the patient's head CT shows no obvious bleed.  I had a lengthy discussion with [employer] control who said it would be unlikely for oral amiodarone to cause a significant heart block but could be possible.  They felt it was reasonable to treat the patient as if she were a beta-blocker overdose with fluids, direct acting vasopressors, and glucagon if needed.  We will give a trial of 5 mg of IV glucagon now to see the effect.  Her QTC right now is elongated so I will also give her 4 g of magnesium.     ----------------------------------------- 7:35 AM on 12/06/2017 -----------------------------------------  [facility] control called me back after discussing the case with their toxicologist and they felt the patient's symptoms were most likely related to Norvasc toxicity.  This would be consistent with the hyperglycemia and persistent narrow complex bradycardia recalcitrant to current therapy.  I discussed the need for central line  with family and placed a left internal jugular central line as the right internal jugular had aberrant anatomy and was not large enough to easily access.  At the recommendation of [facility] control I initiated high-dose insulin therapy at 60 units an hour.  Levophed is now currently up to 50, I am given 3 g of calcium, and 1 more liter of IV fluid to make sure her tank is full.  While she is actively extravasating its in the muscle and this should [place] nod off and should not cause enough bleeding to cause life-threatening hypotension.  Regardless as she is only anticoagulated for atrial fibrillation I have reversed her with Andexxa.  As of right now the patient's heart rate is 51 and her blood pressure is 155/64 and she is mentating well.  She has been accepted to [facility] and we are awaiting [facility] to help [place] transfer. ____________________________________________  ----------------------------------------- 7:58 AM on 12/06/2017 ----------------------------------------- I called [facility] to update him and spoke with the ER attending to update about the patient's complicated course and current status.  [facility] should be here momentarily.  FINAL CLINICAL IMPRESSION(S) / ED DIAGNOSES  Final diagnoses:  Symptomatic bradycardia  Hemorrhage due to trauma  Calcium channel blocker adverse reaction, initial encounter      NEW MEDICATIONS STARTED DURING THIS VISIT:  New Prescriptions   No medications on file     Note:  This document was prepared using Dragon voice recognition software and may include unintentional dictation errors.     Darel Hong, MD 12/06/17 9507    Darel Hong, MD 12/06/17 [phone]

## 2017-12-06 NOTE — ED Triage Notes (Addendum)
Patient to [facility] via EMS from home after fall.  Per EMS patient fell on Saturday and tonight.  EMS reports patient pale and clammy on their arrival and radial pulse difficulty to find.  During triage pain reports slight pain to back of head.

## 2017-12-06 NOTE — ED Notes (Signed)
Assumed care from [place], [place]. Bedside report given.

## 2017-12-07 LAB — GLUCOSE, CAPILLARY: Glucose-Capillary: 355 mg/dL — ABNORMAL HIGH (ref 70–99)

## 2017-12-07 MED ORDER — ENOXAPARIN SODIUM 30 MG/0.3ML ~~LOC~~ SOLN
30.00 | SUBCUTANEOUS | Status: DC
Start: 2017-12-08 — End: 2017-12-07

## 2017-12-07 MED FILL — Medication: Qty: 1 | Status: AC

## 2017-12-08 MED ORDER — IBUPROFEN 400 MG PO TABS
400.00 | ORAL_TABLET | ORAL | Status: DC
Start: ? — End: 2017-12-08

## 2017-12-08 MED ORDER — ACETAMINOPHEN 500 MG PO TABS
1000.00 | ORAL_TABLET | ORAL | Status: DC
Start: 2017-12-14 — End: 2017-12-08

## 2017-12-08 MED ORDER — TRAMADOL HCL 50 MG PO TABS
50.00 | ORAL_TABLET | ORAL | Status: DC
Start: ? — End: 2017-12-08

## 2017-12-08 MED ORDER — LIDOCAINE 5 % EX PTCH
2.00 | MEDICATED_PATCH | CUTANEOUS | Status: DC
Start: 2017-12-15 — End: 2017-12-08

## 2017-12-08 MED ORDER — ENOXAPARIN SODIUM 30 MG/0.3ML ~~LOC~~ SOLN
30.00 | SUBCUTANEOUS | Status: DC
Start: 2017-12-14 — End: 2017-12-08

## 2017-12-08 MED ORDER — POLYETHYLENE GLYCOL 3350 17 G PO PACK
17.00 | PACK | ORAL | Status: DC
Start: 2017-12-12 — End: 2017-12-08

## 2017-12-08 MED ORDER — DEXTROSE 10 % IV SOLN
12.50 | INTRAVENOUS | Status: DC
Start: ? — End: 2017-12-08

## 2017-12-08 MED ORDER — INSULIN REGULAR HUMAN 100 UNIT/ML IJ SOLN
0.00 | INTRAMUSCULAR | Status: DC
Start: 2017-12-14 — End: 2017-12-08

## 2017-12-09 MED ORDER — INSULIN GLARGINE 100 UNIT/ML ~~LOC~~ SOLN
10.00 | SUBCUTANEOUS | Status: DC
Start: 2017-12-14 — End: 2017-12-09

## 2017-12-09 MED ORDER — METFORMIN HCL 500 MG PO TABS
500.00 | ORAL_TABLET | ORAL | Status: DC
Start: 2017-12-11 — End: 2017-12-09

## 2017-12-10 MED ORDER — AMLODIPINE BESYLATE 10 MG PO TABS
10.00 | ORAL_TABLET | ORAL | Status: DC
Start: 2017-12-15 — End: 2017-12-10

## 2017-12-14 DIAGNOSIS — R279 Unspecified lack of coordination: Secondary | ICD-10-CM | POA: Diagnosis not present

## 2017-12-14 DIAGNOSIS — R5381 Other malaise: Secondary | ICD-10-CM | POA: Diagnosis not present

## 2017-12-14 DIAGNOSIS — S301XXA Contusion of abdominal wall, initial encounter: Secondary | ICD-10-CM | POA: Diagnosis not present

## 2017-12-14 DIAGNOSIS — I495 Sick sinus syndrome: Secondary | ICD-10-CM | POA: Diagnosis not present

## 2017-12-14 DIAGNOSIS — R001 Bradycardia, unspecified: Secondary | ICD-10-CM | POA: Diagnosis not present

## 2017-12-14 DIAGNOSIS — H409 Unspecified glaucoma: Secondary | ICD-10-CM | POA: Diagnosis not present

## 2017-12-14 DIAGNOSIS — I110 Hypertensive heart disease with heart failure: Secondary | ICD-10-CM | POA: Diagnosis not present

## 2017-12-14 DIAGNOSIS — E119 Type 2 diabetes mellitus without complications: Secondary | ICD-10-CM | POA: Diagnosis not present

## 2017-12-14 DIAGNOSIS — S42202A Unspecified fracture of upper end of left humerus, initial encounter for closed fracture: Secondary | ICD-10-CM | POA: Insufficient documentation

## 2017-12-14 DIAGNOSIS — M6281 Muscle weakness (generalized): Secondary | ICD-10-CM | POA: Diagnosis not present

## 2017-12-14 DIAGNOSIS — I509 Heart failure, unspecified: Secondary | ICD-10-CM | POA: Diagnosis not present

## 2017-12-14 DIAGNOSIS — I4891 Unspecified atrial fibrillation: Secondary | ICD-10-CM | POA: Diagnosis not present

## 2017-12-14 DIAGNOSIS — Z794 Long term (current) use of insulin: Secondary | ICD-10-CM | POA: Diagnosis not present

## 2017-12-14 DIAGNOSIS — W19XXXD Unspecified fall, subsequent encounter: Secondary | ICD-10-CM | POA: Diagnosis not present

## 2017-12-14 DIAGNOSIS — R1901 Right upper quadrant abdominal swelling, mass and lump: Secondary | ICD-10-CM | POA: Diagnosis not present

## 2017-12-14 DIAGNOSIS — N2889 Other specified disorders of kidney and ureter: Secondary | ICD-10-CM | POA: Diagnosis not present

## 2017-12-15 DIAGNOSIS — I495 Sick sinus syndrome: Secondary | ICD-10-CM | POA: Insufficient documentation

## 2017-12-15 DIAGNOSIS — N2889 Other specified disorders of kidney and ureter: Secondary | ICD-10-CM | POA: Diagnosis not present

## 2017-12-15 DIAGNOSIS — Z794 Long term (current) use of insulin: Secondary | ICD-10-CM | POA: Diagnosis not present

## 2017-12-15 DIAGNOSIS — E119 Type 2 diabetes mellitus without complications: Secondary | ICD-10-CM | POA: Diagnosis not present

## 2017-12-15 DIAGNOSIS — I4891 Unspecified atrial fibrillation: Secondary | ICD-10-CM | POA: Diagnosis not present

## 2017-12-21 ENCOUNTER — Ambulatory Visit: Payer: Medicare Other | Admitting: Family Medicine

## 2017-12-28 DIAGNOSIS — E119 Type 2 diabetes mellitus without complications: Secondary | ICD-10-CM | POA: Diagnosis not present

## 2017-12-28 DIAGNOSIS — D649 Anemia, unspecified: Secondary | ICD-10-CM | POA: Diagnosis not present

## 2017-12-28 DIAGNOSIS — I10 Essential (primary) hypertension: Secondary | ICD-10-CM | POA: Diagnosis not present

## 2017-12-28 DIAGNOSIS — I4891 Unspecified atrial fibrillation: Secondary | ICD-10-CM | POA: Diagnosis not present

## 2017-12-29 ENCOUNTER — Telehealth: Payer: Self-pay | Admitting: Family Medicine

## 2017-12-29 NOTE — Telephone Encounter (Signed)
Copied from [facility] [phone]. Topic: General - Other >> Dec 29, 2017  3:41 PM Keene Breath wrote: Reason for CRM: Apolonio Schneiders with [facility] called to notify the nurse that there is a medication interaction with furosemide (LASIX) 20 MG tablet and amiodarone (PACERONE) 200 MG tablet.  Please advise.  CB# [phone].

## 2017-12-30 ENCOUNTER — Telehealth: Payer: Self-pay | Admitting: Family Medicine

## 2017-12-30 ENCOUNTER — Telehealth: Payer: Self-pay | Admitting: Cardiovascular Disease

## 2017-12-30 ENCOUNTER — Telehealth: Payer: Self-pay

## 2017-12-30 DIAGNOSIS — I509 Heart failure, unspecified: Secondary | ICD-10-CM | POA: Diagnosis not present

## 2017-12-30 DIAGNOSIS — I110 Hypertensive heart disease with heart failure: Secondary | ICD-10-CM | POA: Diagnosis not present

## 2017-12-30 DIAGNOSIS — W19XXXD Unspecified fall, subsequent encounter: Secondary | ICD-10-CM | POA: Diagnosis not present

## 2017-12-30 DIAGNOSIS — I4891 Unspecified atrial fibrillation: Secondary | ICD-10-CM | POA: Diagnosis not present

## 2017-12-30 DIAGNOSIS — E119 Type 2 diabetes mellitus without complications: Secondary | ICD-10-CM | POA: Diagnosis not present

## 2017-12-30 DIAGNOSIS — R001 Bradycardia, unspecified: Secondary | ICD-10-CM | POA: Diagnosis not present

## 2017-12-30 DIAGNOSIS — H409 Unspecified glaucoma: Secondary | ICD-10-CM | POA: Diagnosis not present

## 2017-12-30 DIAGNOSIS — S301XXD Contusion of abdominal wall, subsequent encounter: Secondary | ICD-10-CM | POA: Diagnosis not present

## 2017-12-30 NOTE — Telephone Encounter (Signed)
Please call regarding medication interactions with Furosemide and Amiodarone.

## 2017-12-30 NOTE — Telephone Encounter (Signed)
S/w Apolonio Schneiders, with Home health. She received a flag for patient taking amiodarone and furosemide could cause prolonged QT interval.  She wanted to make sure Dr Fletcher Anon was aware.  Patient last saw Dr Fletcher Anon 10/05/17, patient was on these two medications at this time.   Patient was also at [facility] recently for a fall. Patient sustained a good sized hematoma to her right hip and buttocks. Nurse states patient's hip is still very bruised.  Patient is off Xarelto and has not had in aobut 4 weeks.   Will route to Dr Fletcher Anon for review and any further advice on drug interaction or Xarelto. Patient scheduled to see Ignacia Bayley, NP on 01/18/18 for further evaluation.

## 2017-12-30 NOTE — Telephone Encounter (Signed)
Called and spoke to pt to schedule a FU appt and pt stated she would call back next week to schedule.

## 2017-12-30 NOTE — Telephone Encounter (Signed)
S/w patient. She wanted to make Dr Fletcher Anon aware she has been off Xarelto since a fall about 4 weeks ago. She went to [facility] for treatment.  There is another phone call open today regarding patient and sent the other call to Dr Fletcher Anon regarding this. See that note.

## 2017-12-30 NOTE — Telephone Encounter (Signed)
Pt c/o medication issue:  1. Name of Medication: Xarelto 20 MG   2. How are you currently taking this medication (dosage and times per day)? Not taking  3. Are you having a reaction (difficulty breathing--STAT)?   4. What is your medication issue? Patient was seen in hospital for internal bleeding and was taken off of Xarelto, will need to know when to get back on medication.  Please call to discuss

## 2017-12-30 NOTE — Telephone Encounter (Signed)
Amiodarone and furosemide together are fine.  She should stay off Xarelto for now as she might not be a candidate for anticoagulation anymore given frequent falls.

## 2017-12-30 NOTE — Telephone Encounter (Signed)
[facility] called needs orders for medication managment, diet, disease process of DM because it is out of control.  Rehab sent her home with [facility] and they want you to take over orders? She was also started on  insulin pt needs order for glucometer because she is not checking sugars.  She is currently on lantus 12 units nightly. Apolonio Schneiders [phone]

## 2017-12-30 NOTE — Telephone Encounter (Signed)
Please have them contact prescribing physician Cards to discuss.

## 2017-12-30 NOTE — Telephone Encounter (Signed)
[facility] notified to contact cardio

## 2017-12-31 NOTE — Telephone Encounter (Signed)
She'll need an appointment ASAP or we can't approve any home health orders; not my rules -- Medicare rules Let them know that Amber Wolfe tried to get her to schedule a follow-up visit and she did not They will need to get orders from one of her other treating doctors unless she is seen here for a post SNF visit

## 2017-12-31 NOTE — Telephone Encounter (Signed)
No answer. Left message to call back with Apolonio Schneiders.

## 2017-12-31 NOTE — Telephone Encounter (Signed)
Patient needs an appointment for follow-up; Medicare rules require her to be seen for a face-to-face visit; if she is requiring home health, PT/OT, anything like that, she needs to see me or she'll need to get orders from another one of her doctors

## 2017-12-31 NOTE — Telephone Encounter (Signed)
Called [phone] @ 2:18 and spoke with Apolonio Schneiders. She verbalized understanding and will contact the patient to get her to schedule an appt.

## 2017-12-31 NOTE — Telephone Encounter (Signed)
Apolonio Schneiders, Home health nurse, calling back. She verbalized understanding the amiodarone and furosemide are fine as well as to remain off Xarelto. She is aware we scheduled patient for f/u on 01/18/18 as well.

## 2018-01-03 ENCOUNTER — Telehealth: Payer: Self-pay | Admitting: Cardiovascular Disease

## 2018-01-03 DIAGNOSIS — E119 Type 2 diabetes mellitus without complications: Secondary | ICD-10-CM | POA: Diagnosis not present

## 2018-01-03 DIAGNOSIS — H409 Unspecified glaucoma: Secondary | ICD-10-CM | POA: Diagnosis not present

## 2018-01-03 DIAGNOSIS — R001 Bradycardia, unspecified: Secondary | ICD-10-CM | POA: Diagnosis not present

## 2018-01-03 DIAGNOSIS — W19XXXD Unspecified fall, subsequent encounter: Secondary | ICD-10-CM | POA: Diagnosis not present

## 2018-01-03 DIAGNOSIS — S301XXD Contusion of abdominal wall, subsequent encounter: Secondary | ICD-10-CM | POA: Diagnosis not present

## 2018-01-03 DIAGNOSIS — I110 Hypertensive heart disease with heart failure: Secondary | ICD-10-CM | POA: Diagnosis not present

## 2018-01-03 DIAGNOSIS — I4891 Unspecified atrial fibrillation: Secondary | ICD-10-CM | POA: Diagnosis not present

## 2018-01-03 DIAGNOSIS — I509 Heart failure, unspecified: Secondary | ICD-10-CM | POA: Diagnosis not present

## 2018-01-03 NOTE — Telephone Encounter (Signed)
Amber Wolfe from [facility] calling for PT orders from Dr. Fletcher Anon for  2 x week for 4 weeks then 1 x week for 2 weeks   Please call with Verbal [phone]

## 2018-01-03 NOTE — Telephone Encounter (Signed)
Dr. Fletcher Anon,  Are you wanting to handle PT orders for this patient or can I direct them to the patient's PCP for these orders?  Thank you!

## 2018-01-04 DIAGNOSIS — E119 Type 2 diabetes mellitus without complications: Secondary | ICD-10-CM | POA: Diagnosis not present

## 2018-01-04 DIAGNOSIS — E7800 Pure hypercholesterolemia, unspecified: Secondary | ICD-10-CM | POA: Diagnosis not present

## 2018-01-04 DIAGNOSIS — E785 Hyperlipidemia, unspecified: Secondary | ICD-10-CM | POA: Diagnosis not present

## 2018-01-04 DIAGNOSIS — E042 Nontoxic multinodular goiter: Secondary | ICD-10-CM | POA: Diagnosis not present

## 2018-01-04 DIAGNOSIS — E1143 Type 2 diabetes mellitus with diabetic autonomic (poly)neuropathy: Secondary | ICD-10-CM | POA: Diagnosis not present

## 2018-01-04 DIAGNOSIS — I10 Essential (primary) hypertension: Secondary | ICD-10-CM | POA: Diagnosis not present

## 2018-01-04 DIAGNOSIS — I4891 Unspecified atrial fibrillation: Secondary | ICD-10-CM | POA: Diagnosis not present

## 2018-01-04 DIAGNOSIS — N281 Cyst of kidney, acquired: Secondary | ICD-10-CM | POA: Diagnosis not present

## 2018-01-04 DIAGNOSIS — H6123 Impacted cerumen, bilateral: Secondary | ICD-10-CM | POA: Diagnosis not present

## 2018-01-04 NOTE — Telephone Encounter (Signed)
This should be directed to primary care physician.

## 2018-01-05 DIAGNOSIS — H409 Unspecified glaucoma: Secondary | ICD-10-CM | POA: Diagnosis not present

## 2018-01-05 DIAGNOSIS — S301XXD Contusion of abdominal wall, subsequent encounter: Secondary | ICD-10-CM | POA: Diagnosis not present

## 2018-01-05 DIAGNOSIS — R001 Bradycardia, unspecified: Secondary | ICD-10-CM | POA: Diagnosis not present

## 2018-01-05 DIAGNOSIS — I4891 Unspecified atrial fibrillation: Secondary | ICD-10-CM | POA: Diagnosis not present

## 2018-01-05 DIAGNOSIS — G603 Idiopathic progressive neuropathy: Secondary | ICD-10-CM | POA: Diagnosis not present

## 2018-01-05 DIAGNOSIS — E119 Type 2 diabetes mellitus without complications: Secondary | ICD-10-CM | POA: Diagnosis not present

## 2018-01-05 DIAGNOSIS — R000 Tachycardia, unspecified: Secondary | ICD-10-CM | POA: Diagnosis not present

## 2018-01-05 DIAGNOSIS — E1159 Type 2 diabetes mellitus with other circulatory complications: Secondary | ICD-10-CM | POA: Diagnosis not present

## 2018-01-05 DIAGNOSIS — I110 Hypertensive heart disease with heart failure: Secondary | ICD-10-CM | POA: Diagnosis not present

## 2018-01-05 DIAGNOSIS — E1142 Type 2 diabetes mellitus with diabetic polyneuropathy: Secondary | ICD-10-CM | POA: Diagnosis not present

## 2018-01-05 DIAGNOSIS — W19XXXD Unspecified fall, subsequent encounter: Secondary | ICD-10-CM | POA: Diagnosis not present

## 2018-01-05 DIAGNOSIS — I739 Peripheral vascular disease, unspecified: Secondary | ICD-10-CM | POA: Diagnosis not present

## 2018-01-05 DIAGNOSIS — I509 Heart failure, unspecified: Secondary | ICD-10-CM | POA: Diagnosis not present

## 2018-01-05 DIAGNOSIS — Z13220 Encounter for screening for lipoid disorders: Secondary | ICD-10-CM | POA: Diagnosis not present

## 2018-01-05 NOTE — Telephone Encounter (Signed)
I called and spoke with Costella Hatcher with [facility]. She is aware that the patient's PT orders will need to come from her PCP per Dr. Fletcher Anon.

## 2018-01-07 ENCOUNTER — Other Ambulatory Visit: Payer: Self-pay | Admitting: Cardiovascular Disease

## 2018-01-07 DIAGNOSIS — W19XXXD Unspecified fall, subsequent encounter: Secondary | ICD-10-CM | POA: Diagnosis not present

## 2018-01-07 DIAGNOSIS — R001 Bradycardia, unspecified: Secondary | ICD-10-CM | POA: Diagnosis not present

## 2018-01-07 DIAGNOSIS — I110 Hypertensive heart disease with heart failure: Secondary | ICD-10-CM | POA: Diagnosis not present

## 2018-01-07 DIAGNOSIS — S301XXD Contusion of abdominal wall, subsequent encounter: Secondary | ICD-10-CM | POA: Diagnosis not present

## 2018-01-07 DIAGNOSIS — I4891 Unspecified atrial fibrillation: Secondary | ICD-10-CM | POA: Diagnosis not present

## 2018-01-07 DIAGNOSIS — H409 Unspecified glaucoma: Secondary | ICD-10-CM | POA: Diagnosis not present

## 2018-01-07 DIAGNOSIS — E119 Type 2 diabetes mellitus without complications: Secondary | ICD-10-CM | POA: Diagnosis not present

## 2018-01-07 DIAGNOSIS — I509 Heart failure, unspecified: Secondary | ICD-10-CM | POA: Diagnosis not present

## 2018-01-10 DIAGNOSIS — I10 Essential (primary) hypertension: Secondary | ICD-10-CM | POA: Diagnosis not present

## 2018-01-10 DIAGNOSIS — R011 Cardiac murmur, unspecified: Secondary | ICD-10-CM | POA: Diagnosis not present

## 2018-01-10 DIAGNOSIS — I361 Nonrheumatic tricuspid (valve) insufficiency: Secondary | ICD-10-CM | POA: Diagnosis not present

## 2018-01-10 DIAGNOSIS — I340 Nonrheumatic mitral (valve) insufficiency: Secondary | ICD-10-CM | POA: Diagnosis not present

## 2018-01-10 DIAGNOSIS — I517 Cardiomegaly: Secondary | ICD-10-CM | POA: Diagnosis not present

## 2018-01-10 DIAGNOSIS — I6523 Occlusion and stenosis of bilateral carotid arteries: Secondary | ICD-10-CM | POA: Diagnosis not present

## 2018-01-11 DIAGNOSIS — I509 Heart failure, unspecified: Secondary | ICD-10-CM | POA: Diagnosis not present

## 2018-01-11 DIAGNOSIS — W19XXXD Unspecified fall, subsequent encounter: Secondary | ICD-10-CM | POA: Diagnosis not present

## 2018-01-11 DIAGNOSIS — R001 Bradycardia, unspecified: Secondary | ICD-10-CM | POA: Diagnosis not present

## 2018-01-11 DIAGNOSIS — S301XXD Contusion of abdominal wall, subsequent encounter: Secondary | ICD-10-CM | POA: Diagnosis not present

## 2018-01-11 DIAGNOSIS — I4891 Unspecified atrial fibrillation: Secondary | ICD-10-CM | POA: Diagnosis not present

## 2018-01-11 DIAGNOSIS — H409 Unspecified glaucoma: Secondary | ICD-10-CM | POA: Diagnosis not present

## 2018-01-11 DIAGNOSIS — E119 Type 2 diabetes mellitus without complications: Secondary | ICD-10-CM | POA: Diagnosis not present

## 2018-01-11 DIAGNOSIS — I110 Hypertensive heart disease with heart failure: Secondary | ICD-10-CM | POA: Diagnosis not present

## 2018-01-13 DIAGNOSIS — E119 Type 2 diabetes mellitus without complications: Secondary | ICD-10-CM | POA: Diagnosis not present

## 2018-01-13 DIAGNOSIS — I110 Hypertensive heart disease with heart failure: Secondary | ICD-10-CM | POA: Diagnosis not present

## 2018-01-13 DIAGNOSIS — I4891 Unspecified atrial fibrillation: Secondary | ICD-10-CM | POA: Diagnosis not present

## 2018-01-13 DIAGNOSIS — W19XXXD Unspecified fall, subsequent encounter: Secondary | ICD-10-CM | POA: Diagnosis not present

## 2018-01-13 DIAGNOSIS — S301XXD Contusion of abdominal wall, subsequent encounter: Secondary | ICD-10-CM | POA: Diagnosis not present

## 2018-01-13 DIAGNOSIS — I509 Heart failure, unspecified: Secondary | ICD-10-CM | POA: Diagnosis not present

## 2018-01-13 DIAGNOSIS — R001 Bradycardia, unspecified: Secondary | ICD-10-CM | POA: Diagnosis not present

## 2018-01-13 DIAGNOSIS — H409 Unspecified glaucoma: Secondary | ICD-10-CM | POA: Diagnosis not present

## 2018-01-18 ENCOUNTER — Ambulatory Visit: Payer: Medicare Other | Admitting: Nurse Practitioner

## 2018-01-18 ENCOUNTER — Encounter: Payer: Self-pay | Admitting: Nurse Practitioner

## 2018-01-18 VITALS — BP 132/60 | HR 51 | Ht 63.0 in | Wt 135.0 lb

## 2018-01-18 DIAGNOSIS — I110 Hypertensive heart disease with heart failure: Secondary | ICD-10-CM | POA: Diagnosis not present

## 2018-01-18 DIAGNOSIS — W19XXXD Unspecified fall, subsequent encounter: Secondary | ICD-10-CM | POA: Diagnosis not present

## 2018-01-18 DIAGNOSIS — S301XXD Contusion of abdominal wall, subsequent encounter: Secondary | ICD-10-CM

## 2018-01-18 DIAGNOSIS — I10 Essential (primary) hypertension: Secondary | ICD-10-CM

## 2018-01-18 DIAGNOSIS — I509 Heart failure, unspecified: Secondary | ICD-10-CM | POA: Diagnosis not present

## 2018-01-18 DIAGNOSIS — E782 Mixed hyperlipidemia: Secondary | ICD-10-CM

## 2018-01-18 DIAGNOSIS — I4892 Unspecified atrial flutter: Secondary | ICD-10-CM | POA: Diagnosis not present

## 2018-01-18 DIAGNOSIS — Z794 Long term (current) use of insulin: Secondary | ICD-10-CM

## 2018-01-18 DIAGNOSIS — R001 Bradycardia, unspecified: Secondary | ICD-10-CM

## 2018-01-18 DIAGNOSIS — I480 Paroxysmal atrial fibrillation: Secondary | ICD-10-CM

## 2018-01-18 DIAGNOSIS — H409 Unspecified glaucoma: Secondary | ICD-10-CM | POA: Diagnosis not present

## 2018-01-18 DIAGNOSIS — I4891 Unspecified atrial fibrillation: Secondary | ICD-10-CM | POA: Diagnosis not present

## 2018-01-18 DIAGNOSIS — N2889 Other specified disorders of kidney and ureter: Secondary | ICD-10-CM

## 2018-01-18 DIAGNOSIS — E119 Type 2 diabetes mellitus without complications: Secondary | ICD-10-CM

## 2018-01-18 NOTE — Patient Instructions (Signed)
Medication Instructions:  Your physician recommends that you continue on your current medications as directed. Please refer to the Current Medication list given to you today.  If you need a refill on your cardiac medications before your next appointment, please call your pharmacy.   Lab work: none If you have labs (blood work) drawn today and your tests are completely normal, you will receive your results only by: Marland Kitchen MyChart Message (if you have MyChart) OR . A paper copy in the mail If you have any lab test that is abnormal or we need to change your treatment, we will call you to review the results.  Testing/Procedures: none  Follow-Up: At [facility], you and your health needs are our priority.  As part of our continuing mission to provide you with exceptional heart care, we have created designated Provider Care Teams.  These Care Teams include your primary Cardiologist (physician) and Advanced Practice Providers (APPs -  Physician Assistants and Nurse Practitioners) who all work together to provide you with the care you need, when you need it. You will need a follow up appointment in 1 months. You may see Kathlyn Sacramento, MD or one of the following Advanced Practice Providers on your designated Care Team:   Murray Hodgkins, NP Christell Faith, PA-C . Marrianne Mood, PA-C

## 2018-01-18 NOTE — Progress Notes (Signed)
Office Visit    Patient Name: Amber Wolfe Date of Encounter: 01/18/2018  Primary Care Provider:  Remi Haggard, FNP Primary Cardiologist:  Kathlyn Sacramento, MD  Chief Complaint    84 year old female with a history of atrial flutter/fibrillation, hypertension, hyperlipidemia, type 2 diabetes mellitus, and mitral regurgitation, who presents for follow-up after recent hospitalization following fall w/ large R flank hematoma (xarelto d/c'd) and bradycardia (amio d/c'd).  Past Medical History    Past Medical History:  Diagnosis Date  . Arthritis    In hands  . Atrial flutter ([place])    a. s/p successful TEE/DCCV 07/31/2015; b. on Xarelto; c. CHADS2VASc => 6 (CHF, HTN, age x 2, DM, female); d. 01/2016 Amio added for Afib.  . Bradycardia    a. 11/2017 noted during hospitalization @ [facility]-->amio d/c'd by cardiology.  . Cataract   . Chronic combined systolic (congestive) and diastolic (congestive) heart failure ([place])    a. 06/2015 Echo: EF 40%, basal HK, nl WM of mid and apical segments, mild to mod MR/TR; b. TEE 09/05/4852: nl LV systolic function, mild to mod MR, trivial TR; c. 06/2017 Echo: EF 55-60%, mild LVH, Gr2 DD, triv AI, mild MR, mod dil LA, mildly dil RA, mild to mod TR. PASP 35-44mHg.  . Diabetes mellitus without complication (HCC)    Type II  . GERD (gastroesophageal reflux disease)   . Glaucoma    Right Eye  . Hematoma of abdominal wall    a. 11/2017 Fall-->R flank hematoma w/ anemia req PRBC.  .Marland KitchenHyperlipidemia   . Hypertension   . Moderate mitral regurgitation    a. see echo from 06/2015 and TEE 07/2015; b. 06/2017 Echo: Mild MR.  .Marland KitchenPAF (paroxysmal atrial fibrillation) ([place]    a.  [facility]  . Right renal mass    a. 11/2017 CT Abd ([place]: 3.4cm R upper pole renal mass concerning for renal cell carcinoma-->outpt f/u w/ urology.   Past Surgical History:  Procedure Laterality Date  . CATARACT EXTRACTION Bilateral   . CESAREAN SECTION      . ELECTROPHYSIOLOGIC STUDY N/A 07/31/2015   Procedure: CARDIOVERSION;  Surgeon: AWende Bushy MD;  Location: [facility] ORS;  Service: Cardiovascular;  Laterality: N/A;  . ELECTROPHYSIOLOGIC STUDY N/A 04/03/2016   Procedure: CARDIOVERSION;  Surgeon: TMinna Merritts MD;  Location: [facility] ORS;  Service: Cardiovascular;  Laterality: N/A;  . TEE WITHOUT CARDIOVERSION N/A 07/31/2015   Procedure: TRANSESOPHAGEAL ECHOCARDIOGRAM (TEE);  Surgeon: AWende Bushy MD;  Location: [facility] ORS;  Service: Cardiovascular;  Laterality: N/A;  . WISDOM TOOTH EXTRACTION    . Wrist Surgery Left     Allergies  No Known Allergies  History of Present Illness    84year old female with the above past medical history including atrial fibrillation and flutter, hypertension, hyperlipidemia, type 2 diabetes mellitus, and mitral regurgitation.  Atrial flutter was diagnosed in early 2017 and she subsequently underwent cardioversion.  She required initiation of amiodarone in November 2017 secondary to atrial fibrillation.  She has been maintained on amiodarone and Xarelto therapy but in the setting of baseline bradycardia, she required discontinuation of beta-blocker therapy and amiodarone dose was reduced to 100 mg daily in April 2019.  An echocardiogram was performed in April which showed normal LV function with mild to moderate TR and mild MR.  Ms. ADeolreports that over the past year, she had been experiencing multiple falls, at least once every other week despite trying to use her  walker.  Preceding falls, her legs simply felt weak and she would fall either forward or backward.  She has never lost consciousness.  In early September, she fell more than one time on the same day and had significant right hip and flank pain.  She was seen in the [facility] ED with CT showing a large right flank hematoma and she was transferred to [facility] for further management.  Xarelto was held and she was seen by the trauma team.  She did not require surgery.   Hemoglobin did drop into the 7 range and she did receive 1 unit of packed red blood cells.  During hospitalization, she was also noted to have bradycardia which resulted in discontinuation of amiodarone therapy.  She was seen by cardiology during that visit with an echocardiogram showing normal LV function and aortic sclerosis.  In the setting of her bleed, her Xarelto was also discontinued.  Of note, imaging also incidentally showed a right renal upper pole mass suspicious for renal cell carcinoma and she has urology follow-up scheduled in December.  Following her hospitalization at [facility], she was discharged to acute rehab for 2 weeks and has since been working with physical therapy at home.  Since her hospitalization, she has not had any recurrent falls.  She feels that her legs are getting stronger and she has changed walkers and feels that this is made a difference in her balance.  She has follow-up with primary care with a repeat blood count about a week ago.  As far she knows, H&H was stable.  She denies any palpitations, chest pain, dyspnea, PND, orthopnea, dizziness, syncope, edema, or early satiety.  Home Medications    Prior to Admission medications   Medication Sig Start Date End Date Taking? Authorizing Provider  acetaminophen (TYLENOL) 500 MG tablet Take 500-1,000 mg by mouth daily as needed for moderate pain or headache.    [provider]  alendronate (FOSAMAX) 70 MG tablet Take 1 tablet (70 mg total) by mouth every 7 (seven) days. Take with a full glass of water on an empty stomach. 06/17/17   Lada, Satira Anis, MD  amLODipine (NORVASC) 10 MG tablet Take 1 tablet (10 mg total) by mouth daily. 05/27/17 11/02/17  Rise Mu, PA-C  atorvastatin (LIPITOR) 20 MG tablet TAKE 1 TABLET BY MOUTH AT BEDTIME 07/17/17   Arnetha Courser, MD  Blood Pressure KIT Check blood pressure daily or as needed if tired or fatigued; dx I10 12/17/16   Lada, Satira Anis, MD  insulin glargine (LANTUS) 100 UNIT/ML  injection Inject 12 Units into the skin Nightly.    [provider]  latanoprost (XALATAN) 0.005 % ophthalmic solution Place 1 drop into both eyes at bedtime.     [provider]  timolol (TIMOPTIC) 0.5 % ophthalmic solution Place 1 drop into both eyes at bedtime.  02/11/15   [provider]  traMADol (ULTRAM) 50 MG tablet Take 1-2 tablets by mouth every 6 hours as needed for moderate to severe pain Patient not taking: Reported on 12/06/2017 08/12/17   Hinda Kehr, MD    Review of Systems    Doing well since recent hospitalization and rehab stay.  Working with physical therapy and tolerating it well with steady improvement in strength.  She denies chest pain, palpitations, dyspnea, PND, orthopnea, dizziness, syncope, edema, or early satiety.  All other systems reviewed and are otherwise negative except as noted above.  Physical Exam    VS:  BP 132/60 (BP Location:  Left Arm, Patient Position: Sitting, Cuff Size: Normal)   Pulse (!) 51   Ht 5' 3"  (1.6 m)   Wt 135 lb (61.2 kg)   BMI 23.91 kg/m  , BMI Body mass index is 23.91 kg/m. GEN: Well nourished, well developed, in no acute distress. HEENT: normal. Neck: Supple, no JVD, carotid bruits, or masses. Cardiac: RRR, 2/6 syst murmur @ LLSB  apex, no rubs, or gallops. No clubbing, cyanosis, edema.  Radials/DP/PT 2+ and equal bilaterally.  Respiratory:  Respirations regular and unlabored, clear to auscultation bilaterally. GI: Soft, nontender, nondistended, BS + x 4. MS: no deformity or atrophy. Skin: warm and dry, no rash. Neuro:  Strength and sensation are intact. Psych: Normal affect.  Accessory Clinical Findings    ECG personally reviewed by me today -sinus bradycardia, 51, first-degree AV block, left axis deviation, left anterior fascicular block, LVH with repolarization abnormality- no acute changes.  Assessment & Plan    1.  Paroxysmal atrial fibrillation and flutter: Status post prior cardioversion for  flutter in early 2017 with subsequent initiation of amiodarone in late 2017 for atrial fibrillation.  She had previously been anticoagulated with Xarelto.  Over the past year, she has had frequent falls and in early September, she fell and developed a right flank hematoma with significant anemia requiring 1 unit of packed red blood cells.  She was seen by the trauma surgery team at [facility] and fortunately did not require any surgical interventions.  In that setting, Xarelto has been on hold.  Amiodarone was also discontinued by the cardiology service at [facility] in the setting of hemodynamically stable bradycardia with recommendation to remain off of amiodarone.  She has not had any recurrent palpitations though also notes that in the past, she was asymptomatic with A. fib/flutter.  She is in sinus rhythm today and remains bradycardic at a rate of 51.  We will continue to hold amiodarone and in the setting of bradycardia, she remains a poor candidate for any other AV nodal blocking agents.  If she were experience recurrence of atrial arrhythmias, she would likely require pacemaker placement for tachybradycardia syndrome.  We did discuss her anticoagulation in the setting of recent bleed with resolving right hip/flank hematoma, I will continue to hold Xarelto for now.  We will plan to have her follow-up in a month at which point, we can reevaluate a CBC and check in on her fall status and make a determination whether or not to resume anticoagulation.  2.  Falls with Right flank hematoma: See above.  We will continue to hold Xarelto in setting of resolving hematoma.  She has not fallen since hospitalization.  Will consider resumption of anticoagulation in a month pending recovery.  3.  Sinus bradycardia: This is previously limited amiodarone and AV nodal blocking agent dosing.  She was more bradycardic during recent hospitalization which resulted in discontinuation of amiodarone.  Heart rate only 51 today.  I will continue  to hold antiarrhythmic therapy.  As above, I suspect she would require pacemaker placement if she were to redevelop atrial arrhythmias.  4.  Essential hypertension: Stable on calcium channel blocker therapy only.  I do not think amlodipine would be significantly contributing to bradycardia.  5.  Hyperlipidemia: Continue statin therapy.  6.  Mitral regurgitation: Mild by echo in April.  7.  Type 2 diabetes mellitus: On Lantus and followed by primary care.  8.  Right upper pole renal mass: Incidentally noted on recent CT of her abdomen/pelvis.  Pending  biopsy and has follow-up with [facility] urology in December.  9.  Disposition: Follow-up in 1 month or sooner if necessary.   Murray Hodgkins, NP 01/18/2018, 9:51 AM

## 2018-01-19 DIAGNOSIS — I110 Hypertensive heart disease with heart failure: Secondary | ICD-10-CM | POA: Diagnosis not present

## 2018-01-19 DIAGNOSIS — I4891 Unspecified atrial fibrillation: Secondary | ICD-10-CM | POA: Diagnosis not present

## 2018-01-19 DIAGNOSIS — H409 Unspecified glaucoma: Secondary | ICD-10-CM | POA: Diagnosis not present

## 2018-01-19 DIAGNOSIS — W19XXXD Unspecified fall, subsequent encounter: Secondary | ICD-10-CM | POA: Diagnosis not present

## 2018-01-19 DIAGNOSIS — E119 Type 2 diabetes mellitus without complications: Secondary | ICD-10-CM | POA: Diagnosis not present

## 2018-01-19 DIAGNOSIS — R001 Bradycardia, unspecified: Secondary | ICD-10-CM | POA: Diagnosis not present

## 2018-01-19 DIAGNOSIS — S301XXD Contusion of abdominal wall, subsequent encounter: Secondary | ICD-10-CM | POA: Diagnosis not present

## 2018-01-19 DIAGNOSIS — I509 Heart failure, unspecified: Secondary | ICD-10-CM | POA: Diagnosis not present

## 2018-01-22 DIAGNOSIS — I110 Hypertensive heart disease with heart failure: Secondary | ICD-10-CM | POA: Diagnosis not present

## 2018-01-22 DIAGNOSIS — E119 Type 2 diabetes mellitus without complications: Secondary | ICD-10-CM | POA: Diagnosis not present

## 2018-01-22 DIAGNOSIS — S301XXD Contusion of abdominal wall, subsequent encounter: Secondary | ICD-10-CM | POA: Diagnosis not present

## 2018-01-22 DIAGNOSIS — I4891 Unspecified atrial fibrillation: Secondary | ICD-10-CM | POA: Diagnosis not present

## 2018-01-22 DIAGNOSIS — H409 Unspecified glaucoma: Secondary | ICD-10-CM | POA: Diagnosis not present

## 2018-01-22 DIAGNOSIS — I509 Heart failure, unspecified: Secondary | ICD-10-CM | POA: Diagnosis not present

## 2018-01-22 DIAGNOSIS — W19XXXD Unspecified fall, subsequent encounter: Secondary | ICD-10-CM | POA: Diagnosis not present

## 2018-01-22 DIAGNOSIS — R001 Bradycardia, unspecified: Secondary | ICD-10-CM | POA: Diagnosis not present

## 2018-01-24 DIAGNOSIS — I4891 Unspecified atrial fibrillation: Secondary | ICD-10-CM | POA: Diagnosis not present

## 2018-01-24 DIAGNOSIS — I110 Hypertensive heart disease with heart failure: Secondary | ICD-10-CM | POA: Diagnosis not present

## 2018-01-24 DIAGNOSIS — H409 Unspecified glaucoma: Secondary | ICD-10-CM | POA: Diagnosis not present

## 2018-01-24 DIAGNOSIS — S301XXD Contusion of abdominal wall, subsequent encounter: Secondary | ICD-10-CM | POA: Diagnosis not present

## 2018-01-24 DIAGNOSIS — I509 Heart failure, unspecified: Secondary | ICD-10-CM | POA: Diagnosis not present

## 2018-01-24 DIAGNOSIS — W19XXXD Unspecified fall, subsequent encounter: Secondary | ICD-10-CM | POA: Diagnosis not present

## 2018-01-24 DIAGNOSIS — R001 Bradycardia, unspecified: Secondary | ICD-10-CM | POA: Diagnosis not present

## 2018-01-24 DIAGNOSIS — E119 Type 2 diabetes mellitus without complications: Secondary | ICD-10-CM | POA: Diagnosis not present

## 2018-01-27 DIAGNOSIS — I4891 Unspecified atrial fibrillation: Secondary | ICD-10-CM | POA: Diagnosis not present

## 2018-01-27 DIAGNOSIS — S301XXD Contusion of abdominal wall, subsequent encounter: Secondary | ICD-10-CM | POA: Diagnosis not present

## 2018-01-27 DIAGNOSIS — H409 Unspecified glaucoma: Secondary | ICD-10-CM | POA: Diagnosis not present

## 2018-01-27 DIAGNOSIS — I110 Hypertensive heart disease with heart failure: Secondary | ICD-10-CM | POA: Diagnosis not present

## 2018-01-27 DIAGNOSIS — R001 Bradycardia, unspecified: Secondary | ICD-10-CM | POA: Diagnosis not present

## 2018-01-27 DIAGNOSIS — I509 Heart failure, unspecified: Secondary | ICD-10-CM | POA: Diagnosis not present

## 2018-01-27 DIAGNOSIS — W19XXXD Unspecified fall, subsequent encounter: Secondary | ICD-10-CM | POA: Diagnosis not present

## 2018-01-27 DIAGNOSIS — E119 Type 2 diabetes mellitus without complications: Secondary | ICD-10-CM | POA: Diagnosis not present

## 2018-01-28 ENCOUNTER — Telehealth: Payer: Self-pay | Admitting: Nurse Practitioner

## 2018-01-28 DIAGNOSIS — R001 Bradycardia, unspecified: Secondary | ICD-10-CM | POA: Diagnosis not present

## 2018-01-28 DIAGNOSIS — E119 Type 2 diabetes mellitus without complications: Secondary | ICD-10-CM | POA: Diagnosis not present

## 2018-01-28 DIAGNOSIS — W19XXXD Unspecified fall, subsequent encounter: Secondary | ICD-10-CM | POA: Diagnosis not present

## 2018-01-28 DIAGNOSIS — I509 Heart failure, unspecified: Secondary | ICD-10-CM | POA: Diagnosis not present

## 2018-01-28 DIAGNOSIS — S301XXD Contusion of abdominal wall, subsequent encounter: Secondary | ICD-10-CM | POA: Diagnosis not present

## 2018-01-28 DIAGNOSIS — I110 Hypertensive heart disease with heart failure: Secondary | ICD-10-CM | POA: Diagnosis not present

## 2018-01-28 DIAGNOSIS — I4891 Unspecified atrial fibrillation: Secondary | ICD-10-CM | POA: Diagnosis not present

## 2018-01-28 DIAGNOSIS — H409 Unspecified glaucoma: Secondary | ICD-10-CM | POA: Diagnosis not present

## 2018-01-28 NOTE — Telephone Encounter (Signed)
.  STAT if HR is under 50 or over 120 (normal HR is 60-100 beats per minute)  1) What is your heart rate? 49   2) Do you have a log of your heart rate readings (document readings)? no  3) Do you have any other symptoms? No other symptoms States patient feels well and has no other symptoms.

## 2018-01-28 NOTE — Telephone Encounter (Signed)
Home Care Nurse from [facility], [name], calling to report patient's heart rate during her visit today was between 49-51. Patient has no complaints or symptoms and stated she felt good. Nurse wanted to notify us of this finding. We review med list. Nurse was thinking patient was on Amiodarone but we investigated and patient was taken off it while in the hospital in [facility] around 9/17 and was not on it as discharge or at office visit on 01/18/18. Patient's HR on 01/18/18 was 51 as well. She's going to check with patient to verify she indeed is not taking the amiodarone. Advised that I will route to Ignacia Bayley, NP to review for any additional changes.

## 2018-01-28 NOTE — Telephone Encounter (Signed)
No answer. Left message to call back.   

## 2018-01-29 NOTE — Telephone Encounter (Signed)
Await verification that patient is not taking amiodarone. Her timolol eye drops can play a role in her bradycardia and she should discuss this further with her prescribing provider. Recent thyroid function subclinical and potassium stable. Could consider outpatient monitoring if indicated.

## 2018-01-31 NOTE — Telephone Encounter (Signed)
Called and spoke with Amber Wolfe, home health nurse. She spoke with patient on Friday and patient had been taking the Amiodarone. Patient left skilled nursing facility around 10/2 and started on the home health care on 10/2. Patient seems to have been taking it since returning home because amiodarone was possibly on her list when at the nursing home. [name] instructed patient to stop the amiodarone on Friday. Routing to provider to make him aware.

## 2018-02-01 DIAGNOSIS — S301XXD Contusion of abdominal wall, subsequent encounter: Secondary | ICD-10-CM | POA: Diagnosis not present

## 2018-02-01 DIAGNOSIS — W19XXXD Unspecified fall, subsequent encounter: Secondary | ICD-10-CM | POA: Diagnosis not present

## 2018-02-01 DIAGNOSIS — I4891 Unspecified atrial fibrillation: Secondary | ICD-10-CM | POA: Diagnosis not present

## 2018-02-01 DIAGNOSIS — I509 Heart failure, unspecified: Secondary | ICD-10-CM | POA: Diagnosis not present

## 2018-02-01 DIAGNOSIS — E119 Type 2 diabetes mellitus without complications: Secondary | ICD-10-CM | POA: Diagnosis not present

## 2018-02-01 DIAGNOSIS — I110 Hypertensive heart disease with heart failure: Secondary | ICD-10-CM | POA: Diagnosis not present

## 2018-02-01 DIAGNOSIS — H409 Unspecified glaucoma: Secondary | ICD-10-CM | POA: Diagnosis not present

## 2018-02-01 DIAGNOSIS — R001 Bradycardia, unspecified: Secondary | ICD-10-CM | POA: Diagnosis not present

## 2018-02-05 DIAGNOSIS — I4891 Unspecified atrial fibrillation: Secondary | ICD-10-CM | POA: Diagnosis not present

## 2018-02-05 DIAGNOSIS — H409 Unspecified glaucoma: Secondary | ICD-10-CM | POA: Diagnosis not present

## 2018-02-05 DIAGNOSIS — I110 Hypertensive heart disease with heart failure: Secondary | ICD-10-CM | POA: Diagnosis not present

## 2018-02-05 DIAGNOSIS — S301XXD Contusion of abdominal wall, subsequent encounter: Secondary | ICD-10-CM | POA: Diagnosis not present

## 2018-02-05 DIAGNOSIS — R001 Bradycardia, unspecified: Secondary | ICD-10-CM | POA: Diagnosis not present

## 2018-02-05 DIAGNOSIS — E119 Type 2 diabetes mellitus without complications: Secondary | ICD-10-CM | POA: Diagnosis not present

## 2018-02-05 DIAGNOSIS — I509 Heart failure, unspecified: Secondary | ICD-10-CM | POA: Diagnosis not present

## 2018-02-05 DIAGNOSIS — W19XXXD Unspecified fall, subsequent encounter: Secondary | ICD-10-CM | POA: Diagnosis not present

## 2018-02-09 DIAGNOSIS — E119 Type 2 diabetes mellitus without complications: Secondary | ICD-10-CM | POA: Diagnosis not present

## 2018-02-09 DIAGNOSIS — I110 Hypertensive heart disease with heart failure: Secondary | ICD-10-CM | POA: Diagnosis not present

## 2018-02-09 DIAGNOSIS — R001 Bradycardia, unspecified: Secondary | ICD-10-CM | POA: Diagnosis not present

## 2018-02-09 DIAGNOSIS — I509 Heart failure, unspecified: Secondary | ICD-10-CM | POA: Diagnosis not present

## 2018-02-09 DIAGNOSIS — S301XXD Contusion of abdominal wall, subsequent encounter: Secondary | ICD-10-CM | POA: Diagnosis not present

## 2018-02-09 DIAGNOSIS — W19XXXD Unspecified fall, subsequent encounter: Secondary | ICD-10-CM | POA: Diagnosis not present

## 2018-02-09 DIAGNOSIS — I4891 Unspecified atrial fibrillation: Secondary | ICD-10-CM | POA: Diagnosis not present

## 2018-02-09 DIAGNOSIS — H409 Unspecified glaucoma: Secondary | ICD-10-CM | POA: Diagnosis not present

## 2018-02-11 DIAGNOSIS — E119 Type 2 diabetes mellitus without complications: Secondary | ICD-10-CM | POA: Diagnosis not present

## 2018-02-11 DIAGNOSIS — W19XXXD Unspecified fall, subsequent encounter: Secondary | ICD-10-CM | POA: Diagnosis not present

## 2018-02-11 DIAGNOSIS — I509 Heart failure, unspecified: Secondary | ICD-10-CM | POA: Diagnosis not present

## 2018-02-11 DIAGNOSIS — I110 Hypertensive heart disease with heart failure: Secondary | ICD-10-CM | POA: Diagnosis not present

## 2018-02-11 DIAGNOSIS — H409 Unspecified glaucoma: Secondary | ICD-10-CM | POA: Diagnosis not present

## 2018-02-11 DIAGNOSIS — I4891 Unspecified atrial fibrillation: Secondary | ICD-10-CM | POA: Diagnosis not present

## 2018-02-11 DIAGNOSIS — S301XXD Contusion of abdominal wall, subsequent encounter: Secondary | ICD-10-CM | POA: Diagnosis not present

## 2018-02-11 DIAGNOSIS — R001 Bradycardia, unspecified: Secondary | ICD-10-CM | POA: Diagnosis not present

## 2018-02-16 DIAGNOSIS — E119 Type 2 diabetes mellitus without complications: Secondary | ICD-10-CM | POA: Diagnosis not present

## 2018-02-16 DIAGNOSIS — R001 Bradycardia, unspecified: Secondary | ICD-10-CM | POA: Diagnosis not present

## 2018-02-16 DIAGNOSIS — S301XXD Contusion of abdominal wall, subsequent encounter: Secondary | ICD-10-CM | POA: Diagnosis not present

## 2018-02-16 DIAGNOSIS — W19XXXD Unspecified fall, subsequent encounter: Secondary | ICD-10-CM | POA: Diagnosis not present

## 2018-02-16 DIAGNOSIS — I4891 Unspecified atrial fibrillation: Secondary | ICD-10-CM | POA: Diagnosis not present

## 2018-02-16 DIAGNOSIS — H409 Unspecified glaucoma: Secondary | ICD-10-CM | POA: Diagnosis not present

## 2018-02-16 DIAGNOSIS — I509 Heart failure, unspecified: Secondary | ICD-10-CM | POA: Diagnosis not present

## 2018-02-16 DIAGNOSIS — I110 Hypertensive heart disease with heart failure: Secondary | ICD-10-CM | POA: Diagnosis not present

## 2018-02-18 ENCOUNTER — Ambulatory Visit: Payer: Medicare Other | Admitting: Nurse Practitioner

## 2018-02-18 DIAGNOSIS — E119 Type 2 diabetes mellitus without complications: Secondary | ICD-10-CM | POA: Diagnosis not present

## 2018-02-18 DIAGNOSIS — R001 Bradycardia, unspecified: Secondary | ICD-10-CM | POA: Diagnosis not present

## 2018-02-18 DIAGNOSIS — I110 Hypertensive heart disease with heart failure: Secondary | ICD-10-CM | POA: Diagnosis not present

## 2018-02-18 DIAGNOSIS — S301XXD Contusion of abdominal wall, subsequent encounter: Secondary | ICD-10-CM | POA: Diagnosis not present

## 2018-02-18 DIAGNOSIS — I509 Heart failure, unspecified: Secondary | ICD-10-CM | POA: Diagnosis not present

## 2018-02-18 DIAGNOSIS — I4891 Unspecified atrial fibrillation: Secondary | ICD-10-CM | POA: Diagnosis not present

## 2018-02-18 DIAGNOSIS — W19XXXD Unspecified fall, subsequent encounter: Secondary | ICD-10-CM | POA: Diagnosis not present

## 2018-02-18 DIAGNOSIS — H409 Unspecified glaucoma: Secondary | ICD-10-CM | POA: Diagnosis not present

## 2018-02-21 DIAGNOSIS — E119 Type 2 diabetes mellitus without complications: Secondary | ICD-10-CM | POA: Diagnosis not present

## 2018-02-21 DIAGNOSIS — W19XXXD Unspecified fall, subsequent encounter: Secondary | ICD-10-CM | POA: Diagnosis not present

## 2018-02-21 DIAGNOSIS — I509 Heart failure, unspecified: Secondary | ICD-10-CM | POA: Diagnosis not present

## 2018-02-21 DIAGNOSIS — H409 Unspecified glaucoma: Secondary | ICD-10-CM | POA: Diagnosis not present

## 2018-02-21 DIAGNOSIS — I110 Hypertensive heart disease with heart failure: Secondary | ICD-10-CM | POA: Diagnosis not present

## 2018-02-21 DIAGNOSIS — S301XXD Contusion of abdominal wall, subsequent encounter: Secondary | ICD-10-CM | POA: Diagnosis not present

## 2018-02-21 DIAGNOSIS — I4891 Unspecified atrial fibrillation: Secondary | ICD-10-CM | POA: Diagnosis not present

## 2018-02-21 DIAGNOSIS — R001 Bradycardia, unspecified: Secondary | ICD-10-CM | POA: Diagnosis not present

## 2018-02-28 ENCOUNTER — Other Ambulatory Visit: Payer: Self-pay

## 2018-02-28 ENCOUNTER — Emergency Department
Admission: EM | Admit: 2018-02-28 | Discharge: 2018-02-28 | Disposition: A | Discharge status: Home / Self Care | Payer: Medicare Other | Attending: Emergency Medicine | Admitting: Emergency Medicine

## 2018-02-28 DIAGNOSIS — Z794 Long term (current) use of insulin: Secondary | ICD-10-CM | POA: Insufficient documentation

## 2018-02-28 DIAGNOSIS — I110 Hypertensive heart disease with heart failure: Secondary | ICD-10-CM | POA: Insufficient documentation

## 2018-02-28 DIAGNOSIS — Z79899 Other long term (current) drug therapy: Secondary | ICD-10-CM | POA: Insufficient documentation

## 2018-02-28 DIAGNOSIS — E1140 Type 2 diabetes mellitus with diabetic neuropathy, unspecified: Secondary | ICD-10-CM | POA: Diagnosis not present

## 2018-02-28 DIAGNOSIS — R8271 Bacteriuria: Secondary | ICD-10-CM

## 2018-02-28 DIAGNOSIS — R350 Frequency of micturition: Secondary | ICD-10-CM

## 2018-02-28 DIAGNOSIS — I5042 Chronic combined systolic (congestive) and diastolic (congestive) heart failure: Secondary | ICD-10-CM | POA: Insufficient documentation

## 2018-02-28 DIAGNOSIS — I10 Essential (primary) hypertension: Secondary | ICD-10-CM | POA: Diagnosis not present

## 2018-02-28 DIAGNOSIS — N390 Urinary tract infection, site not specified: Secondary | ICD-10-CM | POA: Diagnosis not present

## 2018-02-28 LAB — URINALYSIS, COMPLETE (UACMP) WITH MICROSCOPIC
BILIRUBIN URINE: NEGATIVE
Glucose, UA: NEGATIVE mg/dL
Hgb urine dipstick: NEGATIVE
KETONES UR: NEGATIVE mg/dL
LEUKOCYTES UA: NEGATIVE
NITRITE: NEGATIVE
PH: 5 (ref 5.0–8.0)
PROTEIN: NEGATIVE mg/dL
Specific Gravity, Urine: 1.006 (ref 1.005–1.030)

## 2018-02-28 MED ORDER — FOSFOMYCIN TROMETHAMINE 3 G PO PACK
3.0000 g | PACK | ORAL | Status: AC
  Administered 2018-02-28: 3 g via ORAL
  Filled 2018-02-28: qty 3

## 2018-02-28 NOTE — ED Provider Notes (Signed)
[facility] Emergency Department Provider Note   ____________________________________________   First MD Initiated Contact with Patient 02/28/18 0235     (approximate)  I have reviewed the triage vital signs and the nursing notes.   HISTORY  Chief Complaint Urinary Frequency    HPI Amber Wolfe is a 84 y.o. female has a history of multiple previous medical problems, including noted right renal mass.  Patient reports that tonight at about 11 PM she started noticing that she was having to urinate more frequently.  Said to urinate 3 times since midnight, reports she is able to empty her bladder but just has a sensation that she needs to urinate.  She will urinate and modest amount.  Does not feel like her bladder is backed up or she cannot empty it.  No fevers or chills.  No nausea vomiting.  No pain associated.  She is been in her normal state of health.  Reports she is just concerned because she is having to urinate so frequently over the last couple hours.  She reports she checked her blood sugar at home and it was normal, EMS also checked her blood sugar and found her to have a normal range.  She has no other concerns at this point.  Denies any pain or discomfort.   Past Medical History:  Diagnosis Date  . Arthritis    In hands  . Atrial flutter ([place])    a. s/p successful TEE/DCCV 07/31/2015; b. on Xarelto; c. CHADS2VASc => 6 (CHF, HTN, age x 2, DM, female); d. 01/2016 Amio added for Afib.  . Bradycardia    a. 11/2017 noted during hospitalization @ [facility]-->amio d/c'd by cardiology.  . Cataract   . Chronic combined systolic (congestive) and diastolic (congestive) heart failure ([place])    a. 06/2015 Echo: EF 40%, basal HK, nl WM of mid and apical segments, mild to mod MR/TR; b. TEE 7/0/3500: nl LV systolic function, mild to mod MR, trivial TR; c. 06/2017 Echo: EF 55-60%, mild LVH, Gr2 DD, triv AI, mild MR, mod dil LA, mildly dil RA, mild to mod TR. PASP 35-56mHg.    . Diabetes mellitus without complication (HCC)    Type II  . GERD (gastroesophageal reflux disease)   . Glaucoma    Right Eye  . Hematoma of abdominal wall    a. 11/2017 Fall-->R flank hematoma w/ anemia req PRBC.  .Marland KitchenHyperlipidemia   . Hypertension   . Moderate mitral regurgitation    a. see echo from 06/2015 and TEE 07/2015; b. 06/2017 Echo: Mild MR.  .Marland KitchenPAF (paroxysmal atrial fibrillation) ([place]    a.  [facility]  . Right renal mass    a. 11/2017 CT Abd ([facility]: 3.4cm R upper pole renal mass concerning for renal cell carcinoma-->outpt f/u w/ urology.    Patient Active Problem List   Diagnosis Date Noted  . Osteoporosis 12/17/2016  . Bradycardia 12/17/2016  . Microalbuminuria 06/17/2016  . Typical atrial flutter ([place]   . SOB (shortness of breath)   . Palpitations 02/17/2016  . Mitral regurgitation 02/17/2016  . Left calcaneal bursitis 09/19/2015  . Atypical atrial flutter ([place] 06/18/2015  . Hyperlipidemia 06/18/2015  . Primary osteoarthritis of right knee 05/20/2015  . Breast cancer screening 05/20/2015  . Diabetes mellitus with neuropathy ([place] 05/15/2015  . Essential hypertension, benign 05/15/2015  . Medication monitoring encounter 05/15/2015  . Osteoarthritis of both hands 05/15/2015  . Decreased hearing of both ears  05/15/2015  . Atrial fibrillation ([place]) 05/15/2015  . Fracture of distal radius and ulna, left, closed, initial encounter 04/25/2014    Past Surgical History:  Procedure Laterality Date  . CATARACT EXTRACTION Bilateral   . CESAREAN SECTION    . ELECTROPHYSIOLOGIC STUDY N/A 07/31/2015   Procedure: CARDIOVERSION;  Surgeon: Wende Bushy, MD;  Location: [facility] ORS;  Service: Cardiovascular;  Laterality: N/A;  . ELECTROPHYSIOLOGIC STUDY N/A 04/03/2016   Procedure: CARDIOVERSION;  Surgeon: Minna Merritts, MD;  Location: [facility] ORS;  Service: Cardiovascular;  Laterality: N/A;  . TEE WITHOUT CARDIOVERSION N/A 07/31/2015   Procedure:  TRANSESOPHAGEAL ECHOCARDIOGRAM (TEE);  Surgeon: Wende Bushy, MD;  Location: [facility] ORS;  Service: Cardiovascular;  Laterality: N/A;  . WISDOM TOOTH EXTRACTION    . Wrist Surgery Left     Prior to Admission medications   Medication Sig Start Date End Date Taking? Authorizing Provider  acetaminophen (TYLENOL) 500 MG tablet Take 500-1,000 mg by mouth daily as needed for moderate pain or headache.    [provider]  alendronate (FOSAMAX) 70 MG tablet Take 1 tablet (70 mg total) by mouth every 7 (seven) days. Take with a full glass of water on an empty stomach. 06/17/17   Lada, Satira Anis, MD  amLODipine (NORVASC) 10 MG tablet Take 1 tablet (10 mg total) by mouth daily. 05/27/17 01/18/18  Rise Mu, PA-C  atorvastatin (LIPITOR) 20 MG tablet TAKE 1 TABLET BY MOUTH AT BEDTIME 07/17/17   Arnetha Courser, MD  Blood Pressure KIT Check blood pressure daily or as needed if tired or fatigued; dx I10 12/17/16   Lada, Satira Anis, MD  insulin glargine (LANTUS) 100 UNIT/ML injection Inject 12 Units into the skin Nightly.    [provider]  latanoprost (XALATAN) 0.005 % ophthalmic solution Place 1 drop into both eyes at bedtime.     [provider]  timolol (TIMOPTIC) 0.5 % ophthalmic solution Place 1 drop into both eyes at bedtime.  02/11/15   [provider]  traMADol Veatrice Bourbon) 50 MG tablet Take 1-2 tablets by mouth every 6 hours as needed for moderate to severe pain 08/12/17   Hinda Kehr, MD    Allergies Patient has no known allergies.  Family History  Problem Relation Age of Onset  . Cataracts Mother   . Heart attack Mother   . Heart attack Father   . Heart attack Brother   . Aneurysm Daughter   . Heart attack Maternal Grandfather   . Heart attack Paternal Grandfather     Social History Social History   Tobacco Use  . Smoking status: Never Smoker  . Smokeless tobacco: Never Used  . Tobacco comment: smoking cessation materials not required  Substance Use  Topics  . Alcohol use: No  . Drug use: No    Review of Systems Constitutional: No fever/chills Eyes: No visual changes. ENT: No sore throat. Cardiovascular: Denies chest pain. Respiratory: Denies shortness of breath. Gastrointestinal: No abdominal pain.   Genitourinary: Negative for dysuria.  Reports increased frequency.  No darkening of the urine.  No blood in her urine. Musculoskeletal: Negative for back pain. Skin: Negative for rash. Neurological: Negative for headaches, areas of focal weakness or numbness.    ____________________________________________   PHYSICAL EXAM:  VITAL SIGNS: ED Triage Vitals  Enc Vitals Group     BP 02/28/18 0228 (!) 170/71     Pulse Rate 02/28/18 0228 72     Resp 02/28/18 0228 16     Temp 02/28/18 0229  98 F (36.7 C)     Temp Source 02/28/18 0228 Oral     SpO2 02/28/18 0228 99 %     Weight 02/28/18 0229 125 lb (56.7 kg)     Height 02/28/18 0229 5' 3"  (1.6 m)     Head Circumference --      Peak Flow --      Pain Score 02/28/18 0229 0     Pain Loc --      Pain Edu? --      Excl. in [place]? --     Constitutional: Alert and oriented. Well appearing and in no acute distress.  Resting comfortably.  Able to ambulate without distress independently. Eyes: Conjunctivae are normal. Head: Atraumatic. Nose: No congestion/rhinnorhea. Mouth/Throat: Mucous membranes are moist. Neck: No stridor.  Cardiovascular: Normal rate, regular rhythm. Grossly normal heart sounds.  Good peripheral circulation. Respiratory: Normal respiratory effort.  No retractions. Lungs CTAB. Gastrointestinal: Soft and nontender. No distention.  No CVA tenderness bilateral.  No discomfort to palpation across the abdomen including suprapubic region. Musculoskeletal: No lower extremity tenderness nor edema. Neurologic:  Normal speech and language. No gross focal neurologic deficits are appreciated.  Skin:  Skin is warm, dry and intact. No rash noted. Psychiatric: Mood and affect  are normal. Speech and behavior are normal.  ____________________________________________   LABS (all labs ordered are listed, but only abnormal results are displayed)  Labs Reviewed  URINALYSIS, COMPLETE (UACMP) WITH MICROSCOPIC - Abnormal; Notable for the following components:      Result Value   Color, Urine STRAW (*)    APPearance CLEAR (*)    Bacteria, UA RARE (*)    All other components within normal limits  URINE CULTURE  CBG MONITORING, ED   ____________________________________________  EKG   ____________________________________________  RADIOLOGY   ____________________________________________   PROCEDURES  Procedure(s) performed: None  Procedures  Critical Care performed: No  ____________________________________________   INITIAL IMPRESSION / ASSESSMENT AND PLAN / ED COURSE  Pertinent labs & imaging results that were available during my care of the patient were reviewed by me and considered in my medical decision making (see chart for details).   Patient complains at this point of just increased urinary frequency, no other symptoms to noted.  Reassuring evaluation, no acute abdomen and denies any abdominal pain.  Does have a history of right renal mass that she reports she has a biopsy plan for coming up shortly.  She denies any fevers or chills, no nausea or vomiting.  She is alert and ambulatory without distress  I will check urinalysis.  Would seem to be urinary frequency most likely be indicative of possible early urinary tract infection in her case, she denies any other associated symptoms such as back pain or neurologic symptoms.  She does not have any pain associated.  ----------------------------------------- 3:26 AM on 02/28/2018 -----------------------------------------  Urinalysis sent for culture, a clean sample is obtained however given the fact that she does have some bacteria present given the patient's age I will treat her with a single  dose of fosfomycin here.  Urine sent for culture.  Discussed with patient, she will follow-up with her primary care doctor this week, I discussed careful return precautions especially if she is to develop any abdominal pain, fevers chills fatigue, worsening symptoms she will come back and see [place].  Patient agreement with the plan.  Return precautions and treatment recommendations and follow-up discussed with the patient who is agreeable with the plan.  ____________________________________________   FINAL CLINICAL IMPRESSION(S) / ED DIAGNOSES  Final diagnoses:  Bacteria in urine  Urination frequency        Note:  This document was prepared using Dragon voice recognition software and may include unintentional dictation errors       Delman Kitten, MD 02/28/18 [phone]

## 2018-02-28 NOTE — Progress Notes (Signed)
Cardiology Office Note Date:  03/02/2018  Patient ID:  Amber Wolfe, Amber Wolfe 1931/08/19, MRN [id number] PCP:  Remi Haggard, FNP  Cardiologist:  Dr. Fletcher Anon, MD    Chief Complaint: Follow up  History of Present Illness: Amber Wolfe is a 84 y.o. female with history of Afib/flutter as detailed below, symptomatic bradycardia, multiple falls, pulmonary hypertension, chronic diastolic CHF, moderate mitral regurgitation, DM, HTN, and HLD who presents for follow up of her Afib.  She was previously followed by Dr. Yvone Neu. Nuclear stress test done in 05/2015 that showed no evidence of ischemia or perfusion defects with normal EF. She was diagnosed with atrial flutter in early 2017. In this setting she was noted to have a mildly reduced EF of 40% by echo in 06/2015. She subsequently underwent DCCV in 07/2015. She had recurrent Afib in 01/2016 and was placed on amiodarone with repeat successful DCCV on amiodarone in 03/2016. Follow up echo in 07/2016 showed an improved EF of 60-65%, moderate LVH, Gr2DD, mild to moderate MR, mildly dilated LA, mildly to moderately dilated RA, PASP 40-45. In the setting of bradycardia, her beta blocker was discontinued and her amiodarone was decreased to 100 mg daily in 06/2017. An echo performed in 06/2017 showed normal LVSF with mild MR and mild to moderate TR. Over the past year she had been experiencing multiple falls, at least once every other week despite trying to use her walker. In early 11/2017, she fell more than one time in a single day and had significant right hip and flank pain. She was seen in the [facility] ED with a CT showing a large flank hematoma. In this setting, she was transferred to [facility]. Her Xarelto was held and she was seen by the trauma team. She did not require surgery. Her HGB did drop into the 7 range and she did receive 1 unit of pRBC. During her hospitalization she was also noted to have bradycardia which resulted in the discontinuation of her amiodarone therapy.  She was seen by cardiology while admitted with an echo showing normal LVSF and aortic sclerosis. Her Xarelto was discontinued. Incidentally, imaging during her admission also noted a right renal upper pole mass suspicious for renal cell carcinoma. She is schedule to see urology in 02/2018. She was discharged to acute rehab.   She was last seen in the office on 01/18/2018 and denied any recurrent falls. She felt like her legs were getting stronger. She had also changed walkers, which she felt had made a big difference in her balance. She was maintaining sinus rhythm with a bradycardic rate of 51 bpm. It was recommended she continue to remain off [place] and amiodarone at that time.   She comes in accompanied with her daughter today.  She is doing well from a cardiac perspective.  She has not had any falls since she was last seen.  She has completed her home health PT and continues to note strengthening and her legs.  She is continuing to do her PT exercises once to twice daily.  She is using her new walker with any ambulation.  She notes the flank hematoma is continuing to and improve.  She denies any chest pain, shortness of breath, palpitations, PND, orthopnea, dizziness, presyncope, or syncope.  No edema.  Her weight is down 10 pounds today compared to her 10/22 visit though the patient notes she was initially having a low appetite and having difficulty transitioning to a diabetic friendly diet.  She now notes improved p.o.  intake and feels like her weight loss has been attributed to her recent hospitalizations and dietary changes.  She does not have any issues or concerns today.  She is scheduled for her initial urology consultation at [facility] on 03/08/2018.   Past Medical History:  Diagnosis Date  . Arthritis    In hands  . Atrial flutter ([place])    a. s/p successful TEE/DCCV 07/31/2015; b. on Xarelto; c. CHADS2VASc => 6 (CHF, HTN, age x 2, DM, female); d. 01/2016 Amio added for Afib.  . Bradycardia    a.  11/2017 noted during hospitalization @ [facility]-->amio d/c'd by cardiology.  . Cataract   . Chronic combined systolic (congestive) and diastolic (congestive) heart failure ([place])    a. 06/2015 Echo: EF 40%, basal HK, nl WM of mid and apical segments, mild to mod MR/TR; b. TEE 10/05/2954: nl LV systolic function, mild to mod MR, trivial TR; c. 06/2017 Echo: EF 55-60%, mild LVH, Gr2 DD, triv AI, mild MR, mod dil LA, mildly dil RA, mild to mod TR. PASP 35-16mHg.  . Diabetes mellitus without complication (HCC)    Type II  . GERD (gastroesophageal reflux disease)   . Glaucoma    Right Eye  . Hematoma of abdominal wall    a. 11/2017 Fall-->R flank hematoma w/ anemia req PRBC.  .Marland KitchenHyperlipidemia   . Hypertension   . Moderate mitral regurgitation    a. see echo from 06/2015 and TEE 07/2015; b. 06/2017 Echo: Mild MR.  .[place]PAF ([place]ox[place]    a.  [facility]  . Right renal mass    a. 11/2017 CT Abd ([facility]: 3.4cm R upper pole renal mass concerning for renal cell carcinoma-->outpt f/u w/ urology.    Past Surgical History:  Procedure Laterality Date  . CATARACT EXTRACTION Bilateral   . CESAREAN SECTION    . ELECTROPHYSIOLOGIC STUDY N/A 07/31/2015   Procedure: CARDIOVERSION;  Surgeon: AWende Bushy MD;  Location: [facility] ORS;  Service: Cardiovascular;  Laterality: N/A;  . ELECTROPHYSIOLOGIC STUDY N/A 04/03/2016   Procedure: CARDIOVERSION;  Surgeon: TMinna Merritts MD;  Location: [facility] ORS;  Service: Cardiovascular;  Laterality: N/A;  . TEE WITHOUT CARDIOVERSION N/A 07/31/2015   Procedure: TRANSESOPHAGEAL ECHOCARDIOGRAM (TEE);  Surgeon: AWende Bushy MD;  Location: [facility] ORS;  Service: Cardiovascular;  Laterality: N/A;  . WISDOM TOOTH EXTRACTION    . Wrist Surgery Left     Current Meds  Medication Sig  . acetaminophen (TYLENOL) 500 MG tablet Take 500-1,000 mg by mouth daily as needed for moderate pain or headache.  . alendronate (FOSAMAX) 70 MG tablet Take 1  tablet (70 mg total) by mouth every 7 (seven) days. Take with a full glass of water on an empty stomach.  .Marland KitchenamLODipine (NORVASC) 10 MG tablet Take 1 tablet (10 mg total) by mouth daily.  .Marland Kitchenatorvastatin (LIPITOR) 20 MG tablet TAKE 1 TABLET BY MOUTH AT BEDTIME  . Blood Pressure KIT Check blood pressure daily or as needed if tired or fatigued; dx I10  . insulin glargine (LANTUS) 100 UNIT/ML injection Inject 12 Units into the skin Nightly.  . latanoprost (XALATAN) 0.005 % ophthalmic solution Place 1 drop into both eyes at bedtime.   . timolol (TIMOPTIC) 0.5 % ophthalmic solution Place 1 drop into both eyes at bedtime.   . traMADol (ULTRAM) 50 MG tablet Take 1-2 tablets by mouth every 6 hours as needed for moderate to severe pain    Allergies:  Patient has no known allergies.   Social History:  The patient  reports that she has never smoked. She has never used smokeless tobacco. She reports that she does not drink alcohol or use drugs.   Family History:  The patient's family history includes Aneurysm in her daughter; Cataracts in her mother; Heart attack in her brother, father, maternal grandfather, mother, and paternal grandfather.  ROS:   Review of Systems  Constitutional: Positive for malaise/fatigue and weight loss. Negative for chills, diaphoresis and fever.  HENT: Negative for congestion.   Eyes: Negative for discharge and redness.  Respiratory: Negative for cough, hemoptysis, sputum production, shortness of breath and wheezing.   Cardiovascular: Negative for chest pain, palpitations, orthopnea, claudication, leg swelling and PND.  Gastrointestinal: Negative for abdominal pain, blood in stool, heartburn, melena, nausea and vomiting.  Genitourinary: Negative for hematuria.  Musculoskeletal: Negative for falls and myalgias.  Skin: Negative for rash.  Neurological: Negative for dizziness, tingling, tremors, sensory change, speech change, focal weakness, loss of consciousness and weakness.    Endo/Heme/Allergies: Does not bruise/bleed easily.  Psychiatric/Behavioral: Negative for substance abuse. The patient is not nervous/anxious.   All other systems reviewed and are negative.    PHYSICAL EXAM:  VS:  BP 130/60 (BP Location: Left Arm, Patient Position: Sitting, Cuff Size: Normal)   Pulse (!) 54   Ht _0  (1.6 m)   Wt 125 lb (56.7 kg)   BMI 22.14 kg/m  BMI: Body mass index is 22.14 kg/m.  Physical Exam  Constitutional: She is oriented to person, place, and time. She appears well-developed and well-nourished.  HENT:  Head: Normocephalic and atraumatic.  Eyes: Right eye exhibits no discharge. Left eye exhibits no discharge.  Neck: Normal range of motion. No JVD present.  Cardiovascular: Regular rhythm, S1 normal and S2 normal. Bradycardia present. Exam reveals no distant heart sounds, no friction rub, no midsystolic click and no opening snap.  Murmur heard. High-pitched blowing holosystolic murmur is present with a grade of 2/6 at the apex. Pulses:      Posterior tibial pulses are 2+ on the right side, and 2+ on the left side.  Pulmonary/Chest: Effort normal and breath sounds normal. No respiratory distress. She has no decreased breath sounds. She has no wheezes. She has no rales. She exhibits no tenderness.  Abdominal: Soft. She exhibits no distension. There is no tenderness.  Musculoskeletal: She exhibits no edema.  Neurological: She is alert and oriented to person, place, and time.  Skin: Skin is warm and dry. No cyanosis. Nails show no clubbing.  Psychiatric: She has a normal mood and affect. Her speech is normal and behavior is normal. Judgment and thought content normal.     EKG:  Was ordered and interpreted by me today. Shows sinus bradycardia, 54 bpm, left axis deviation, first-degree AV block, LVH with repolarization abnormality, no acute ST-T changes  Recent Labs: 12/06/2017: ALT 22; BUN 16; Creatinine, Ser 1.10; Hemoglobin 11.4; Platelets 155; Potassium 4.6;  Sodium 133; TSH 5.897  06/21/2017: Cholesterol 132; HDL 55; LDL Cholesterol (Calc) 59; Total CHOL/HDL Ratio 2.4; Triglycerides 97   CrCl cannot be calculated (Patient's most recent lab result is older than the maximum 21 days allowed.).   Wt Readings from Last 3 Encounters:  03/02/18 125 lb (56.7 kg)  02/28/18 125 lb (56.7 kg)  01/18/18 135 lb (61.2 kg)     Other studies reviewed: Additional studies/records reviewed today include: summarized above  ASSESSMENT AND PLAN:  1. Paroxysmal A. Fib/flutter: She remains  in sinus rhythm with a bradycardic heart rate.  She is asymptomatic.  She is no longer on beta-blocker or amiodarone secondary to underlying bradycardia.  We will continue to hold these medications at this time given her bradycardic heart rate.  Her Xarelto has been held since 11/2017 in the setting of frequent falls leading to significant flank hematoma.  We will obtain a CBC to evaluate her H&H today.  Given that her strength continues to improve, she has not had any recent falls, and if her H&H are significantly improved she could be a candidate for resumption of oral anticoagulation.  However, she is pending urological evaluation for an incidentally found right upper pole renal mass suspicious for renal cell carcinoma.  She has her initial evaluation with [facility] urology on 03/08/2018.  In this setting, we will continue to hold Xarelto at this time as this work-up may require biopsy and possible surgical intervention based on results.  We will see her back once a formal diagnosis has been made by urology and treatment plan has been established.  She is aware that while off Xarelto she is at increased stroke risk and accepts this.  We would look to resume Xarelto as soon as safely possible based on all of the above.  Should she have recurrence of A. fib with RVR I would be hesitant to significantly escalate AV nodal blocking agents given her underlying baseline bradycardia.  I would also be  hesitant to place her back on amiodarone as she is not adequately anticoagulated at this time.  She would likely require AV nodal ablation with PPM implantation for tachybradycardia syndrome should A. fib with RVR recur.  2. Frequent falls with recent right flank hematoma/acute blood loss anemia: No further falls since she was last evaluated.  She continues to ambulate with her new walker.  Legs are continuing to feel stronger on a daily basis.  She continues with her PT exercises.  She reports her flank hematoma is resolving.  Her anemia required packed red blood cell transfusion at [facility] as outlined above.  We will obtain a CBC today to ensure stability of her H&H.  We will continue to defer resumption of anticoagulation at this time pending her CBC as well as her urological evaluation as she is still pending biopsy of her renal mass as well as potential surgical intervention based off these results.  3. Sinus bradycardia: Heart rate remains mildly bradycardic with a rate of 54 bpm.  Asymptomatic.  She may need to talk with her ophthalmologist with regards to her timolol eyedrops as this may be playing a role in her underlying bradycardic rates.  Doubt amlodipine is playing any significant role here.  4. Hypertension: Blood pressure is reasonably controlled today.  Continue amlodipine 10 mg daily.  5. Right upper pole renal mass: This was incidentally noted on recent CT of her abdomen and pelvis at [facility] and 11/2017 and felt to be suspicious for renal cell carcinoma.  Her weight is down 10 pounds from her last office visit here on 10/22.  She attributes this weight loss to her recent hospitalization as well as dietary changes in the setting of her diabetes.  She is scheduled for her initial [facility] urology consultation on 03/08/2018.  In this setting, we will continue to hold Xarelto as above while she undergoes her urological evaluation/work-up.  She has been made aware of increased stroke risk as outlined  above.  6. Mitral regurgitation: Mild by echo in 06/2017.  Continue to  monitor with periodic echocardiogram.  7. Hyperlipidemia: LDL of 59 from 05/2017.  Remains on atorvastatin 20 mg daily.  Normal AST/ALT in 11/2017.  Disposition: F/u with Dr. Fletcher Anon or an APP in 2 months.  Current medicines are reviewed at length with the patient today.  The patient did not have any concerns regarding medicines.  Signed, Christell Faith, PA-C 03/02/2018 11:45 AM     [address], [place] [address] [phone]

## 2018-02-28 NOTE — ED Triage Notes (Signed)
Pt arrives via ems with complains of urinary frequency since midnight. Pt ambulatory without difficulty.

## 2018-03-01 LAB — URINE CULTURE
Culture: <10000 — AB
SPECIAL REQUESTS: NORMAL

## 2018-03-02 ENCOUNTER — Encounter: Payer: Self-pay | Admitting: Physician Assistant

## 2018-03-02 ENCOUNTER — Ambulatory Visit: Payer: Medicare Other | Admitting: Physician Assistant

## 2018-03-02 VITALS — BP 130/60 | HR 54 | Ht 63.0 in | Wt 125.0 lb

## 2018-03-02 DIAGNOSIS — D649 Anemia, unspecified: Secondary | ICD-10-CM

## 2018-03-02 DIAGNOSIS — I10 Essential (primary) hypertension: Secondary | ICD-10-CM

## 2018-03-02 DIAGNOSIS — R001 Bradycardia, unspecified: Secondary | ICD-10-CM | POA: Diagnosis not present

## 2018-03-02 DIAGNOSIS — W19XXXD Unspecified fall, subsequent encounter: Secondary | ICD-10-CM

## 2018-03-02 DIAGNOSIS — E782 Mixed hyperlipidemia: Secondary | ICD-10-CM

## 2018-03-02 DIAGNOSIS — I480 Paroxysmal atrial fibrillation: Secondary | ICD-10-CM

## 2018-03-02 DIAGNOSIS — I340 Nonrheumatic mitral (valve) insufficiency: Secondary | ICD-10-CM

## 2018-03-02 NOTE — Patient Instructions (Signed)
Medication Instructions:  - Your physician recommends that you continue on your current medications as directed. Please refer to the Current Medication list given to you today.  (continue to hold xarelto for now)  If you need a refill on your cardiac medications before your next appointment, please call your pharmacy.   Lab work: - Your physician recommends that you have lab work today: CBC  If you have labs (blood work) drawn today and your tests are completely normal, you will receive your results only by: Marland Kitchen MyChart Message (if you have MyChart) OR . A paper copy in the mail If you have any lab test that is abnormal or we need to change your treatment, we will call you to review the results.  Testing/Procedures: - none ordered  Follow-Up: At [facility], you and your health needs are our priority.  As part of our continuing mission to provide you with exceptional heart care, we have created designated Provider Care Teams.  These Care Teams include your primary Cardiologist (physician) and Advanced Practice Providers (APPs -  Physician Assistants and Nurse Practitioners) who all work together to provide you with the care you need, when you need it. . 2 months with Dr. Hillery Hunter, PA  Any Other Special Instructions Will Be Listed Below (If Applicable). - N/A

## 2018-03-03 LAB — CBC WITH DIFFERENTIAL/PLATELET
Basophils Absolute: 0.1 10*3/uL (ref 0.0–0.2)
Basos: 1 %
EOS (ABSOLUTE): 0.2 10*3/uL (ref 0.0–0.4)
Eos: 3 %
Hematocrit: 39.1 % (ref 34.0–46.6)
Hemoglobin: 13.5 g/dL (ref 11.1–15.9)
Immature Grans (Abs): 0 10*3/uL (ref 0.0–0.1)
Immature Granulocytes: 0 %
Lymphocytes Absolute: 1.4 10*3/uL (ref 0.7–3.1)
Lymphs: 25 %
MCH: 31.5 pg (ref 26.6–33.0)
MCHC: 34.5 g/dL (ref 31.5–35.7)
MCV: 91 fL (ref 79–97)
Monocytes Absolute: 0.6 10*3/uL (ref 0.1–0.9)
Monocytes: 10 %
NEUTROS ABS: 3.3 10*3/uL (ref 1.4–7.0)
Neutrophils: 61 %
PLATELETS: 171 10*3/uL (ref 150–450)
RBC: 4.29 x10E6/uL (ref 3.77–5.28)
RDW: 13.3 % (ref 12.3–15.4)
WBC: 5.5 10*3/uL (ref 3.4–10.8)

## 2018-03-08 ENCOUNTER — Ambulatory Visit: Payer: Medicare Other | Admitting: Physician Assistant

## 2018-03-08 DIAGNOSIS — N2889 Other specified disorders of kidney and ureter: Secondary | ICD-10-CM | POA: Diagnosis not present

## 2018-03-10 DIAGNOSIS — N2889 Other specified disorders of kidney and ureter: Secondary | ICD-10-CM | POA: Diagnosis not present

## 2018-04-06 DIAGNOSIS — E559 Vitamin D deficiency, unspecified: Secondary | ICD-10-CM | POA: Diagnosis not present

## 2018-04-06 DIAGNOSIS — E785 Hyperlipidemia, unspecified: Secondary | ICD-10-CM | POA: Diagnosis not present

## 2018-04-06 DIAGNOSIS — E119 Type 2 diabetes mellitus without complications: Secondary | ICD-10-CM | POA: Diagnosis not present

## 2018-04-06 DIAGNOSIS — R5383 Other fatigue: Secondary | ICD-10-CM | POA: Diagnosis not present

## 2018-04-06 DIAGNOSIS — E7800 Pure hypercholesterolemia, unspecified: Secondary | ICD-10-CM | POA: Diagnosis not present

## 2018-04-06 DIAGNOSIS — E1143 Type 2 diabetes mellitus with diabetic autonomic (poly)neuropathy: Secondary | ICD-10-CM | POA: Diagnosis not present

## 2018-04-06 DIAGNOSIS — Z0000 Encounter for general adult medical examination without abnormal findings: Secondary | ICD-10-CM | POA: Diagnosis not present

## 2018-04-06 DIAGNOSIS — I10 Essential (primary) hypertension: Secondary | ICD-10-CM | POA: Diagnosis not present

## 2018-04-11 ENCOUNTER — Telehealth: Payer: Self-pay | Admitting: Physician Assistant

## 2018-04-11 MED ORDER — AMLODIPINE BESYLATE 10 MG PO TABS: 10.0000 mg | ORAL_TABLET | Freq: Every day | ORAL | 3 refills | Status: DC

## 2018-04-11 NOTE — Telephone Encounter (Signed)
Spoke with pharmacist. She has patient taking 5mg  daily. I looked back in chart and it showed that patient taking amlodipine at last office visits. Resent in Rx for amlodipine 10 mg daily.

## 2018-04-11 NOTE — Telephone Encounter (Signed)
Please call to give clarification on Amlodipine.

## 2018-04-20 NOTE — Telephone Encounter (Signed)
Patient calling  States that pharmacy only gave her 5 mg amlodipine tablets and told her to take 2 Informed patient it looks as if a 10 MG prescription was submitted  Patient will check with pharmacy then call back if they do not have a 10MG  prescription

## 2018-05-10 ENCOUNTER — Encounter: Payer: Self-pay | Admitting: Cardiovascular Disease

## 2018-05-10 ENCOUNTER — Ambulatory Visit: Payer: Medicare Other | Admitting: Cardiovascular Disease

## 2018-05-10 VITALS — BP 134/50 | HR 52 | Ht 63.0 in | Wt 118.5 lb

## 2018-05-10 DIAGNOSIS — I480 Paroxysmal atrial fibrillation: Secondary | ICD-10-CM | POA: Diagnosis not present

## 2018-05-10 DIAGNOSIS — E785 Hyperlipidemia, unspecified: Secondary | ICD-10-CM

## 2018-05-10 DIAGNOSIS — I10 Essential (primary) hypertension: Secondary | ICD-10-CM | POA: Diagnosis not present

## 2018-05-10 NOTE — Progress Notes (Signed)
Cardiology Office Note   Date:  05/10/2018   ID:  Amber Wolfe, DOB Jul 31, 1931, MRN [id number]  PCP:  Remi Haggard, FNP  Cardiologist:   Kathlyn Sacramento, MD   Chief Complaint  Patient presents with  . Other    2 month follow up. patient states if she does alot she gets out of breath. Meds reviewed verbally with patient.       History of Present Illness: Amber Wolfe is a 85 y.o. female who presents for a follow-up visit regarding atrial flutter/fibrillation. She had successful cardioversion in May 2017 and currently on long-term anticoagulation with Xarelto. She had recurrent atrial fibrillation in November 2017 and was placed on amiodarone. Other medical problems include diabetes mellitus, hypertension, hyperlipidemia and moderate mitral regurgitation. She had a nuclear stress test done in March 2017 which showed no evidence of ischemia or perfusion defects with normal ejection fraction. She had mildly reduced ejection fraction the setting of atrial fibrillation and tachycardia normalized in sinus rhythm.  Beta-blockers were discontinued due to bradycardia.  Amiodarone was decreased to 100 mg daily due to persistent bradycardia. Echocardiogram in April 2019 showed an EF of 55 to 60% with moderately dilated left atrium, mild to moderate tricuspid regurgitation with estimated systolic pulmonary pressure between 40-45 mmHg.  She has recurrent falls over the last year.  One fall in September was complicated by large flank hematoma.  She was transferred to [place] was held.  She did not require surgery.  Amiodarone was discontinued during that admission due to bradycardia.  She was also diagnosed with a renal mass which is being monitored at [facility].  She has been doing well with no recent chest pain, shortness of breath or palpitations.   Past Medical History:  Diagnosis Date  . Arthritis    In hands  . Atrial flutter ([place])    a. s/p successful TEE/DCCV 07/31/2015; b. on  Xarelto; c. CHADS2VASc => 6 (CHF, HTN, age x 2, DM, female); d. 01/2016 Amio added for Afib.  . Bradycardia    a. 11/2017 noted during hospitalization @ [facility]-->amio d/c'd by cardiology.  . Cataract   . Chronic combined systolic (congestive) and diastolic (congestive) heart failure ([place])    a. 06/2015 Echo: EF 40%, basal HK, nl WM of mid and apical segments, mild to mod MR/TR; b. TEE 0/05/1592: nl LV systolic function, mild to mod MR, trivial TR; c. 06/2017 Echo: EF 55-60%, mild LVH, Gr2 DD, triv AI, mild MR, mod dil LA, mildly dil RA, mild to mod TR. PASP 35-71mHg.  . Diabetes mellitus without complication (HCC)    Type II  . GERD (gastroesophageal reflux disease)   . Glaucoma    Right Eye  . Hematoma of abdominal wall    a. 11/2017 Fall-->R flank hematoma w/ anemia req PRBC.  .Marland KitchenHyperlipidemia   . Hypertension   . Moderate mitral regurgitation    a. see echo from 06/2015 and TEE 07/2015; b. 06/2017 Echo: Mild MR.  .Marland [place]PAF ([place]oxysmal [place]) [place]    a.  [facility].  . Right renal mass    a. 11/2017 CT Abd ([facility]: 3.4cm R upper pole renal mass concerning for renal cell carcinoma-->outpt f/u w/ urology.    Past Surgical History:  Procedure Laterality Date  . CATARACT EXTRACTION Bilateral   . CESAREAN SECTION    . ELECTROPHYSIOLOGIC STUDY N/A 07/31/2015   Procedure: CARDIOVERSION;  Surgeon: AWende Bushy MD;  Location: [facility] ORS;  Service: Cardiovascular;  Laterality: N/A;  . ELECTROPHYSIOLOGIC STUDY N/A 04/03/2016   Procedure: CARDIOVERSION;  Surgeon: Minna Merritts, MD;  Location: [facility] ORS;  Service: Cardiovascular;  Laterality: N/A;  . TEE WITHOUT CARDIOVERSION N/A 07/31/2015   Procedure: TRANSESOPHAGEAL ECHOCARDIOGRAM (TEE);  Surgeon: Wende Bushy, MD;  Location: [facility] ORS;  Service: Cardiovascular;  Laterality: N/A;  . WISDOM TOOTH EXTRACTION    . Wrist Surgery Left      Current Outpatient Medications  Medication Sig Dispense Refill  .  amLODipine (NORVASC) 10 MG tablet Take 1 tablet (10 mg total) by mouth daily. 90 tablet 3  . atorvastatin (LIPITOR) 20 MG tablet TAKE 1 TABLET BY MOUTH AT BEDTIME 30 tablet 11  . Blood Pressure KIT Check blood pressure daily or as needed if tired or fatigued; dx I10 1 each 0  . furosemide (LASIX) 20 MG tablet Take 20 mg by mouth daily.    . insulin glargine (LANTUS) 100 UNIT/ML injection Inject 12 Units into the skin Nightly.    . latanoprost (XALATAN) 0.005 % ophthalmic solution Place 1 drop into both eyes at bedtime.     Marland Kitchen lisinopril (PRINIVIL,ZESTRIL) 40 MG tablet Take 40 mg by mouth daily.    . timolol (TIMOPTIC) 0.5 % ophthalmic solution Place 1 drop into both eyes at bedtime.      No current facility-administered medications for this visit.     Allergies:   Patient has no known allergies.    Social History:  The patient  reports that she has never smoked. She has never used smokeless tobacco. She reports that she does not drink alcohol or use drugs.   Family History:  The patient's family history includes Aneurysm in her daughter; Cataracts in her mother; Heart attack in her brother, father, maternal grandfather, mother, and paternal grandfather.    ROS:  Please see the history of present illness.   Otherwise, review of systems are positive for none.   All other systems are reviewed and negative.    PHYSICAL EXAM: VS:  BP (!) 134/50 (BP Location: Left Arm, Patient Position: Sitting, Cuff Size: Normal)   Pulse (!) 52   Ht 5' 3"  (1.6 m)   Wt 118 lb 8 oz (53.8 kg)   BMI 20.99 kg/m  , BMI Body mass index is 20.99 kg/m. GEN: Well nourished, well developed, in no acute distress  HEENT: normal  Neck: no JVD, carotid bruits, or masses Cardiac: RRR; no  rubs, or gallops,no edema . 2/6 crescendo decrescendo systolic murmur in the aortic area which is early peaking Respiratory:  clear to auscultation bilaterally, normal work of breathing GI: soft, nontender, nondistended, + BS MS: no  deformity or atrophy  Skin: warm and dry, no rash Neuro:  Strength and sensation are intact Psych: euthymic mood, full affect   EKG:  EKG is ordered today. The ekg ordered today demonstrates sinus bradycardia with first-degree AV block and moderate LVH.  Recent Labs: 12/06/2017: ALT 22; BUN 16; Creatinine, Ser 1.10; Potassium 4.6; Sodium 133; TSH 5.897 03/02/2018: Hemoglobin 13.5; Platelets 171    Lipid Panel    Component Value Date/Time   CHOL 132 06/21/2017 1414   CHOL 159 05/15/2015 1619   TRIG 97 06/21/2017 1414   TRIG 124 05/15/2015 1619   HDL 55 06/21/2017 1414   CHOLHDL 2.4 06/21/2017 1414   VLDL 15 06/15/2016 1459   VLDL 25 05/15/2015 1619   LDLCALC 59 06/21/2017 1414      Wt Readings from  Last 3 Encounters:  05/10/18 118 lb 8 oz (53.8 kg)  03/02/18 125 lb (56.7 kg)  02/28/18 125 lb (56.7 kg)      No flowsheet data found.    ASSESSMENT AND PLAN:  1.  Paroxysmal atrial fibrillation: She is maintaining in sinus rhythm without any antiarrhythmic medication.  I am hesitant to put her on anything given underlying bradycardia and first-degree AV block.  If she develops recurrent atrial fibrillation with RVR, she will require a permanent pacemaker placement to effectively treat her tachyarrhythmia. Regarding anticoagulation, I am still hesitant to resume anticoagulation given her recurrent falls last year was one being associated with significant injury and large flank hematoma.  I discussed this with the patient and family and I favor continued observation for now.  If she develops recurrent atrial fibrillation and no recurrent falls, then we can consider resuming anticoagulation.  For now, I think the risks outweigh the benefits.  2. Essential hypertension: Blood pressure is controlled on current medications  3. Hyperlipidemia: Currently on atorvastatin with most recent LDL 54.   Disposition:   FU with me in 4 months  Signed,  Kathlyn Sacramento, MD  05/10/2018 2:01  PM    [facility]

## 2018-05-10 NOTE — Patient Instructions (Signed)
Medication Instructions:  No changes  If you need a refill on your cardiac medications before your next appointment, please call your pharmacy.   Lab work: None ordered  Testing/Procedures: None ordered  Follow-Up: At [facility], you and your health needs are our priority.  As part of our continuing mission to provide you with exceptional heart care, we have created designated Provider Care Teams.  These Care Teams include your primary Cardiologist (physician) and Advanced Practice Providers (APPs -  Physician Assistants and Nurse Practitioners) who all work together to provide you with the care you need, when you need it. You will need a follow up appointment in 4 months.  Please call our office 2 months in advance to schedule this appointment.  You may see Muhammad Arida, MD or one of the following Advanced Practice Providers on your designated Care Team:   Christopher Berge, NP Ryan Dunn, PA-C . Jacquelyn Visser, PA-C   

## 2018-05-20 ENCOUNTER — Other Ambulatory Visit: Payer: Self-pay | Admitting: Family Medicine

## 2018-06-17 ENCOUNTER — Other Ambulatory Visit: Payer: Self-pay | Admitting: Family Medicine

## 2018-06-20 ENCOUNTER — Other Ambulatory Visit: Payer: Self-pay

## 2018-06-20 MED ORDER — ATORVASTATIN CALCIUM 20 MG PO TABS: 20.0000 mg | ORAL_TABLET | Freq: Every day | ORAL | 11 refills | Status: DC

## 2018-07-04 ENCOUNTER — Other Ambulatory Visit: Payer: Self-pay | Admitting: Cardiovascular Disease

## 2018-07-04 MED ORDER — FUROSEMIDE 20 MG PO TABS: 20.0000 mg | ORAL_TABLET | Freq: Every day | ORAL | 0 refills | Status: DC

## 2018-07-04 NOTE — Telephone Encounter (Signed)
Please review for refill Historical Provider c

## 2018-07-04 NOTE — Telephone Encounter (Signed)
°*  STAT* If patient is at the pharmacy, call can be transferred to refill team.   1. Which medications need to be refilled? (please list name of each medication and dose if known) furosemide (LASIX) 20 MG daily  2. Which pharmacy/location (including street and city if local pharmacy) is medication to be sent to? [place]   3. Do they need a 30 day or 90 day supply? 90 day

## 2018-07-06 ENCOUNTER — Telehealth: Payer: Self-pay | Admitting: Cardiovascular Disease

## 2018-07-06 NOTE — Telephone Encounter (Signed)
Renee from [place] calling Wants to clarify medications - has question about Amiodarone  Please call [phone]

## 2018-07-06 NOTE — Telephone Encounter (Signed)
Spoke with renee.  Renee stated that the patients previous pharmacy shut down and she was transferred to [facility]  Renee wanted to verify if patient was currently taking Amiodarone.  Made Renee aware that the patient was not currently taking Amiodarone.  She was thankful for the call back

## 2018-07-18 DIAGNOSIS — R300 Dysuria: Secondary | ICD-10-CM | POA: Diagnosis not present

## 2018-07-18 DIAGNOSIS — H409 Unspecified glaucoma: Secondary | ICD-10-CM | POA: Diagnosis not present

## 2018-07-18 DIAGNOSIS — Z112 Encounter for screening for other bacterial diseases: Secondary | ICD-10-CM | POA: Diagnosis not present

## 2018-07-18 DIAGNOSIS — I10 Essential (primary) hypertension: Secondary | ICD-10-CM | POA: Diagnosis not present

## 2018-07-18 DIAGNOSIS — E785 Hyperlipidemia, unspecified: Secondary | ICD-10-CM | POA: Diagnosis not present

## 2018-07-18 DIAGNOSIS — E119 Type 2 diabetes mellitus without complications: Secondary | ICD-10-CM | POA: Diagnosis not present

## 2018-07-18 DIAGNOSIS — E559 Vitamin D deficiency, unspecified: Secondary | ICD-10-CM | POA: Diagnosis not present

## 2018-07-18 DIAGNOSIS — N390 Urinary tract infection, site not specified: Secondary | ICD-10-CM | POA: Diagnosis not present

## 2018-07-18 DIAGNOSIS — Z118 Encounter for screening for other infectious and parasitic diseases: Secondary | ICD-10-CM | POA: Diagnosis not present

## 2018-07-18 DIAGNOSIS — I4891 Unspecified atrial fibrillation: Secondary | ICD-10-CM | POA: Diagnosis not present

## 2018-07-19 DIAGNOSIS — Z13220 Encounter for screening for lipoid disorders: Secondary | ICD-10-CM | POA: Diagnosis not present

## 2018-07-19 DIAGNOSIS — E1142 Type 2 diabetes mellitus with diabetic polyneuropathy: Secondary | ICD-10-CM | POA: Diagnosis not present

## 2018-07-19 DIAGNOSIS — R000 Tachycardia, unspecified: Secondary | ICD-10-CM | POA: Diagnosis not present

## 2018-07-19 DIAGNOSIS — I739 Peripheral vascular disease, unspecified: Secondary | ICD-10-CM | POA: Diagnosis not present

## 2018-07-19 DIAGNOSIS — G603 Idiopathic progressive neuropathy: Secondary | ICD-10-CM | POA: Diagnosis not present

## 2018-07-19 DIAGNOSIS — E1159 Type 2 diabetes mellitus with other circulatory complications: Secondary | ICD-10-CM | POA: Diagnosis not present

## 2018-08-01 ENCOUNTER — Other Ambulatory Visit: Payer: Self-pay

## 2018-08-01 MED ORDER — LISINOPRIL 40 MG PO TABS: 40.0000 mg | ORAL_TABLET | Freq: Every day | ORAL | 0 refills | Status: DC

## 2018-09-07 ENCOUNTER — Telehealth: Payer: Self-pay

## 2018-09-07 NOTE — Telephone Encounter (Signed)
    COVID-19 Pre-Screening Questions:  . In the past 7 to 10 days have you had a cough,  shortness of breath, headache, congestion, fever (100 or greater) body aches, chills, sore throat, or sudden loss of taste or sense of smell? No  . Have you been around anyone with known Covid 19.No . Have you been around anyone who is awaiting Covid 19 test results in the past 7 to 10 days? NO . Have you been around anyone who has been exposed to Covid 19, or has mentioned symptoms of Covid 19 within the past 7 to 10 days?  NO  If you have any concerns/questions about symptoms patients report during screening (either on the phone or at threshold). Contact the provider seeing the patient or DOD for further guidance.  If neither are available contact a member of the leadership team.           

## 2018-09-07 NOTE — Telephone Encounter (Signed)
Opened in error

## 2018-09-13 DIAGNOSIS — N2889 Other specified disorders of kidney and ureter: Secondary | ICD-10-CM | POA: Diagnosis not present

## 2018-09-13 DIAGNOSIS — N281 Cyst of kidney, acquired: Secondary | ICD-10-CM | POA: Diagnosis not present

## 2018-09-14 ENCOUNTER — Telehealth: Payer: Self-pay | Admitting: Cardiovascular Disease

## 2018-09-14 NOTE — Telephone Encounter (Signed)
COVID-19 Pre-Screening Questions:  In the past 7 to 10 days have you had a cough, shortness of breath, headache, congestion, fever (100 or greater) body aches, chills, sore throat, or sudden loss of taste or sense of smell? NO  Have you been around anyone with known Covid 19. NO Have you been around anyone who is awaiting Covid 19 test results in the past 7 to 10 days? NO  Have you been around anyone who has been exposed to Covid 19, or has mentioned symptoms of Covid 19 within the past 7 to 10 days? NO  If you have any concerns/questions about symptoms patients report during screening (either on the phone or at threshold). Contact the provider seeing the patient or DOD for further guidance. If neither are available contact a member of the leadership team.  Patient confirmed appt

## 2018-09-15 ENCOUNTER — Other Ambulatory Visit: Payer: Self-pay

## 2018-09-15 ENCOUNTER — Encounter: Payer: Self-pay | Admitting: Cardiovascular Disease

## 2018-09-15 ENCOUNTER — Ambulatory Visit (INDEPENDENT_AMBULATORY_CARE_PROVIDER_SITE_OTHER): Payer: Medicare Other | Admitting: Cardiovascular Disease

## 2018-09-15 VITALS — BP 140/60 | HR 70 | Temp 98.1°F | Ht 65.0 in | Wt 113.5 lb

## 2018-09-15 DIAGNOSIS — I4819 Other persistent atrial fibrillation: Secondary | ICD-10-CM

## 2018-09-15 DIAGNOSIS — Z79899 Other long term (current) drug therapy: Secondary | ICD-10-CM

## 2018-09-15 DIAGNOSIS — I10 Essential (primary) hypertension: Secondary | ICD-10-CM

## 2018-09-15 DIAGNOSIS — E7849 Other hyperlipidemia: Secondary | ICD-10-CM | POA: Diagnosis not present

## 2018-09-15 MED ORDER — APIXABAN 2.5 MG PO TABS: 2.5000 mg | ORAL_TABLET | Freq: Two times a day (BID) | ORAL | 1 refills | Status: DC

## 2018-09-15 NOTE — Progress Notes (Signed)
Cardiology Office Note   Date:  09/15/2018   ID:  Amber Wolfe, DOB 1931-11-16, MRN [id number]  PCP:  Remi Haggard, FNP  Cardiologist:   Kathlyn Sacramento, MD   Chief Complaint  Patient presents with  . other    4 month follow up. Meds reviewed by the pt. verbally. "doing well."       History of Present Illness: Amber Wolfe is a 85 y.o. female who presents for a follow-up visit regarding atrial flutter/fibrillation. She had previous cardioversions in 2017.   She had recurrent atrial fibrillation in November 2017 and was placed on amiodarone. Other medical problems include diabetes mellitus, hypertension, hyperlipidemia and moderate mitral regurgitation. She had a nuclear stress test done in March 2017 which showed no evidence of ischemia or perfusion defects with normal ejection fraction. She had mildly reduced ejection fraction the setting of atrial fibrillation and tachycardia normalized in sinus rhythm.  She had severe symptomatic bradycardia in the setting of treatment with a beta-blocker and even with amiodarone monotherapy at 100 mg daily.  Thus, she has been off these medications. Echocardiogram in April 2019 showed an EF of 55 to 60% with moderately dilated left atrium, mild to moderate tricuspid regurgitation with estimated systolic pulmonary pressure between 40-45 mmHg. She was hospitalized in September 2019 after a fall that was complicated by large flank hematoma.  Anticoagulation was stopped at that time.  She has been doing well and denies any shortness of breath or palpitations although she is in atrial flutter today.  Ventricular rate is controlled.  She has occasional episodes of chest pain at rest but cannot give an accurate description of this.  She has not had any recurrent falls.   Past Medical History:  Diagnosis Date  . Arthritis    In hands  . Atrial flutter ([place])    a. s/p successful TEE/DCCV 07/31/2015; b. on Xarelto; c. CHADS2VASc => 6 (CHF, HTN, age  x 2, DM, female); d. 01/2016 Amio added for Afib.  . Bradycardia    a. 11/2017 noted during hospitalization @ [facility]-->amio d/c'd by cardiology.  . Cataract   . Chronic combined systolic (congestive) and diastolic (congestive) heart failure ([place])    a. 06/2015 Echo: EF 40%, basal HK, nl WM of mid and apical segments, mild to mod MR/TR; b. TEE 1/0/2585: nl LV systolic function, mild to mod MR, trivial TR; c. 06/2017 Echo: EF 55-60%, mild LVH, Gr2 DD, triv AI, mild MR, mod dil LA, mildly dil RA, mild to mod TR. PASP 35-15mHg.  . Diabetes mellitus without complication (HCC)    Type II  . GERD (gastroesophageal reflux disease)   . Glaucoma    Right Eye  . Hematoma of abdominal wall    a. 11/2017 Fall-->R flank hematoma w/ anemia req PRBC.  .Marland KitchenHyperlipidemia   . Hypertension   . Moderate mitral regurgitation    a. see echo from 06/2015 and TEE 07/2015; b. 06/2017 Echo: Mild MR.  .Marland KitchenPAF (paroxysmal atrial fibrillation) ([place]    a.  [facility]  . Right renal mass    a. 11/2017 CT Abd ([facility]: 3.4cm R upper pole renal mass concerning for renal cell carcinoma-->outpt f/u w/ urology.    Past Surgical History:  Procedure Laterality Date  . CATARACT EXTRACTION Bilateral   . CESAREAN SECTION    . ELECTROPHYSIOLOGIC STUDY N/A 07/31/2015   Procedure: CARDIOVERSION;  Surgeon: AWende Bushy MD;  Location: [facility] ORS;  Service: Cardiovascular;  Laterality: N/A;  . ELECTROPHYSIOLOGIC STUDY N/A 04/03/2016   Procedure: CARDIOVERSION;  Surgeon: Minna Merritts, MD;  Location: [facility] ORS;  Service: Cardiovascular;  Laterality: N/A;  . TEE WITHOUT CARDIOVERSION N/A 07/31/2015   Procedure: TRANSESOPHAGEAL ECHOCARDIOGRAM (TEE);  Surgeon: Wende Bushy, MD;  Location: [facility] ORS;  Service: Cardiovascular;  Laterality: N/A;  . WISDOM TOOTH EXTRACTION    . Wrist Surgery Left      Current Outpatient Medications  Medication Sig Dispense Refill  . amLODipine (NORVASC) 10 MG tablet Take 1 tablet  (10 mg total) by mouth daily. 90 tablet 3  . atorvastatin (LIPITOR) 20 MG tablet Take 1 tablet (20 mg total) by mouth at bedtime. 30 tablet 11  . Blood Pressure KIT Check blood pressure daily or as needed if tired or fatigued; dx I10 1 each 0  . Cholecalciferol (VITAMIN D3) 1.25 MG (50000 UT) CAPS Take 1 capsule by mouth once a week.    . furosemide (LASIX) 20 MG tablet Take 1 tablet (20 mg total) by mouth daily. 90 tablet 0  . insulin glargine (LANTUS) 100 UNIT/ML injection Inject 12 Units into the skin Nightly.    . latanoprost (XALATAN) 0.005 % ophthalmic solution Place 1 drop into both eyes at bedtime.     Marland Kitchen lisinopril (ZESTRIL) 40 MG tablet Take 1 tablet (40 mg total) by mouth daily. 90 tablet 0  . timolol (TIMOPTIC) 0.5 % ophthalmic solution Place 1 drop into both eyes at bedtime.      No current facility-administered medications for this visit.     Allergies:   Patient has no known allergies.    Social History:  The patient  reports that she has never smoked. She has never used smokeless tobacco. She reports that she does not drink alcohol or use drugs.   Family History:  The patient's family history includes Aneurysm in her daughter; Cataracts in her mother; Heart attack in her brother, father, maternal grandfather, mother, and paternal grandfather.    ROS:  Please see the history of present illness.   Otherwise, review of systems are positive for none.   All other systems are reviewed and negative.    PHYSICAL EXAM: VS:  BP 140/60 (BP Location: Left Arm, Patient Position: Sitting, Cuff Size: Normal)   Pulse 70   Temp 98.1 F (36.7 C)   Ht 5' 5"  (1.651 m)   Wt 113 lb 8 oz (51.5 kg)   BMI 18.89 kg/m  , BMI Body mass index is 18.89 kg/m. GEN: Well nourished, well developed, in no acute distress  HEENT: normal  Neck: no JVD, carotid bruits, or masses Cardiac: Irregularly irregular; no  rubs, or gallops,no edema . 2/6 crescendo decrescendo systolic murmur in the aortic area  which is early peaking Respiratory:  clear to auscultation bilaterally, normal work of breathing GI: soft, nontender, nondistended, + BS MS: no deformity or atrophy  Skin: warm and dry, no rash Neuro:  Strength and sensation are intact Psych: euthymic mood, full affect   EKG:  EKG is ordered today. The ekg ordered today demonstrates atrial flutter with variable AV block, LVH with repolarization abnormalities.  Recent Labs: 12/06/2017: ALT 22; BUN 16; Creatinine, Ser 1.10; Potassium 4.6; Sodium 133; TSH 5.897 03/02/2018: Hemoglobin 13.5; Platelets 171    Lipid Panel    Component Value Date/Time   CHOL 132 06/21/2017 1414   CHOL 159 05/15/2015 1619   TRIG 97 06/21/2017 1414   TRIG 124 05/15/2015 1619   HDL 55  06/21/2017 1414   CHOLHDL 2.4 06/21/2017 1414   VLDL 15 06/15/2016 1459   VLDL 25 05/15/2015 1619   LDLCALC 59 06/21/2017 1414      Wt Readings from Last 3 Encounters:  09/15/18 113 lb 8 oz (51.5 kg)  05/10/18 118 lb 8 oz (53.8 kg)  03/02/18 125 lb (56.7 kg)      No flowsheet data found.    ASSESSMENT AND PLAN:  1.  Persistent atrial fibrillation/flutter: The patient is in atrial flutter with variable AV block today.  She does not seem to be symptomatic and ventricular rate is controlled without any medications.  She had severe symptomatic bradycardia in the past on small dose beta-blocker and small dose amiodarone.  Thus, no plans to initiate any rate or rhythm controlling medications at the present time.  If she develops tachycardia, she might require pacemaker placement before starting her on rate controlling medications. Chads vas score is 6 and thus she is at high risk for thromboembolic complication.  Xarelto was discontinued last year due to a fall complicated by a large hematoma.  She has not had any recurrent falls and I think it is best to resume anticoagulation at the present time. Given her weight and age, best option is likely Eliquis at 2.5 mg twice daily  as this will be associated with the least bleeding complication.  Check CBC and basic metabolic profile in 2 weeks.  2. Essential hypertension: Blood pressure is controlled on current medications  3. Hyperlipidemia: Currently on atorvastatin with most recent LDL 54.   Disposition:   FU with me in 4 months  Signed,  Kathlyn Sacramento, MD  09/15/2018 8:04 AM    [place]

## 2018-09-15 NOTE — Patient Instructions (Signed)
Medication Instructions:  Your physician has recommended you make the following change in your medication:  1- START Eliquis 2.5 mg (1 tablet) by mouth two times a day.   If you need a refill on your cardiac medications before your next appointment, please call your pharmacy.   Lab work: Your physician recommends that you return for lab work in: 2 weeks at the [place] (BMET, CBC). - September 29, 2018. Please go to the [facility]. You will check in at the front desk to the right as you walk into the atrium. Valet Parking is offered if needed. - You will need to wear a mask.   If you have labs (blood work) drawn today and your tests are completely normal, you will receive your results only by: Marland Kitchen MyChart Message (if you have MyChart) OR . A paper copy in the mail If you have any lab test that is abnormal or we need to change your treatment, we will call you to review the results.  Testing/Procedures: - None ordered.   Follow-Up: At [facility], you and your health needs are our priority.  As part of our continuing mission to provide you with exceptional heart care, we have created designated Provider Care Teams.  These Care Teams include your primary Cardiologist (physician) and Advanced Practice Providers (APPs -  Physician Assistants and Nurse Practitioners) who all work together to provide you with the care you need, when you need it. You will need a follow up appointment in 4 months.  Please call our office 2 months in advance to schedule this appointment.  You may see Kathlyn Sacramento, MD or one of the following Advanced Practice Providers on your designated Care Team:   Murray Hodgkins, NP Christell Faith, PA-C . Marrianne Mood, PA-C

## 2018-09-29 ENCOUNTER — Other Ambulatory Visit: Payer: Self-pay

## 2018-09-29 ENCOUNTER — Other Ambulatory Visit
Admission: RE | Admit: 2018-09-29 | Discharge: 2018-09-29 | Disposition: A | Discharge status: Home / Self Care | Payer: Medicare Other | Attending: Cardiovascular Disease | Admitting: Cardiovascular Disease

## 2018-09-29 ENCOUNTER — Telehealth: Payer: Self-pay | Admitting: Cardiovascular Disease

## 2018-09-29 DIAGNOSIS — I4819 Other persistent atrial fibrillation: Secondary | ICD-10-CM | POA: Insufficient documentation

## 2018-09-29 DIAGNOSIS — Z79899 Other long term (current) drug therapy: Secondary | ICD-10-CM | POA: Insufficient documentation

## 2018-09-29 LAB — CBC WITH DIFFERENTIAL/PLATELET
Abs Immature Granulocytes: 0.01 10*3/uL (ref 0.00–0.07)
Basophils Absolute: 0 10*3/uL (ref 0.0–0.1)
Basophils Relative: 1 %
Eosinophils Absolute: 0.2 10*3/uL (ref 0.0–0.5)
Eosinophils Relative: 4 %
HCT: 34.5 % — ABNORMAL LOW (ref 36.0–46.0)
Hemoglobin: 11.6 g/dL — ABNORMAL LOW (ref 12.0–15.0)
Immature Granulocytes: 0 %
Lymphocytes Relative: 21 %
Lymphs Abs: 1.2 10*3/uL (ref 0.7–4.0)
MCH: 32.9 pg (ref 26.0–34.0)
MCHC: 33.6 g/dL (ref 30.0–36.0)
MCV: 97.7 fL (ref 80.0–100.0)
Monocytes Absolute: 0.4 10*3/uL (ref 0.1–1.0)
Monocytes Relative: 7 %
Neutro Abs: 3.9 10*3/uL (ref 1.7–7.7)
Neutrophils Relative %: 67 %
Platelets: 160 10*3/uL (ref 150–400)
RBC: 3.53 MIL/uL — ABNORMAL LOW (ref 3.87–5.11)
RDW: 13.4 % (ref 11.5–15.5)
WBC: 5.7 10*3/uL (ref 4.0–10.5)
nRBC: 0 % (ref 0.0–0.2)

## 2018-09-29 LAB — BASIC METABOLIC PANEL
Anion gap: 9 (ref 5–15)
BUN: 17 mg/dL (ref 8–23)
CO2: 27 mmol/L (ref 22–32)
Calcium: 9.3 mg/dL (ref 8.9–10.3)
Chloride: 106 mmol/L (ref 98–111)
Creatinine, Ser: 0.68 mg/dL (ref 0.44–1.00)
GFR calc Af Amer: >60 mL/min (ref >60)
GFR calc non Af Amer: >60 mL/min (ref >60)
Glucose, Bld: 97 mg/dL (ref 70–99)
Potassium: 3.7 mmol/L (ref 3.5–5.1)
Sodium: 142 mmol/L (ref 135–145)

## 2018-09-29 MED ORDER — FUROSEMIDE 20 MG PO TABS: 20.0000 mg | ORAL_TABLET | Freq: Every day | ORAL | 0 refills | Status: DC

## 2018-09-29 NOTE — Telephone Encounter (Signed)
°*  STAT* If patient is at the pharmacy, call can be transferred to refill team.   1. Which medications need to be refilled? (please list name of each medication and dose if known) Lasix 20 mg daily  2. Which pharmacy/location (including street and city if local pharmacy) is medication to be sent to? [place]  3. Do they need a 30 day or 90 day supply? [place]

## 2018-09-29 NOTE — Telephone Encounter (Signed)
furosemide (LASIX) 20 MG tablet 30 tablet 0 09/29/2018    Sig - Route: Take 1 tablet (20 mg total) by mouth daily. - Oral   Sent to pharmacy as: furosemide (LASIX) 20 MG tablet   E-Prescribing Status: Sent to pharmacy (09/29/2018 12:00 PM EDT)   [place], [place]

## 2018-10-13 DIAGNOSIS — L539 Erythematous condition, unspecified: Secondary | ICD-10-CM | POA: Diagnosis not present

## 2018-10-13 DIAGNOSIS — W5503XA Scratched by cat, initial encounter: Secondary | ICD-10-CM | POA: Diagnosis not present

## 2018-10-13 DIAGNOSIS — I10 Essential (primary) hypertension: Secondary | ICD-10-CM | POA: Diagnosis not present

## 2018-10-18 DIAGNOSIS — E785 Hyperlipidemia, unspecified: Secondary | ICD-10-CM | POA: Diagnosis not present

## 2018-10-18 DIAGNOSIS — R2681 Unsteadiness on feet: Secondary | ICD-10-CM | POA: Diagnosis not present

## 2018-10-18 DIAGNOSIS — B354 Tinea corporis: Secondary | ICD-10-CM | POA: Diagnosis not present

## 2018-10-18 DIAGNOSIS — E7800 Pure hypercholesterolemia, unspecified: Secondary | ICD-10-CM | POA: Diagnosis not present

## 2018-10-18 DIAGNOSIS — N281 Cyst of kidney, acquired: Secondary | ICD-10-CM | POA: Diagnosis not present

## 2018-10-18 DIAGNOSIS — E1143 Type 2 diabetes mellitus with diabetic autonomic (poly)neuropathy: Secondary | ICD-10-CM | POA: Diagnosis not present

## 2018-10-18 DIAGNOSIS — E119 Type 2 diabetes mellitus without complications: Secondary | ICD-10-CM | POA: Diagnosis not present

## 2018-10-18 DIAGNOSIS — E559 Vitamin D deficiency, unspecified: Secondary | ICD-10-CM | POA: Diagnosis not present

## 2018-10-31 ENCOUNTER — Other Ambulatory Visit: Payer: Self-pay

## 2018-10-31 ENCOUNTER — Other Ambulatory Visit: Payer: Self-pay | Admitting: Physician Assistant

## 2018-10-31 MED ORDER — LISINOPRIL 40 MG PO TABS: 40.0000 mg | ORAL_TABLET | Freq: Every day | ORAL | 3 refills | Status: DC

## 2018-11-03 ENCOUNTER — Other Ambulatory Visit: Payer: Self-pay | Admitting: *Deleted

## 2018-11-03 MED ORDER — FUROSEMIDE 20 MG PO TABS: 20.0000 mg | ORAL_TABLET | Freq: Every day | ORAL | 2 refills | Status: AC

## 2018-11-15 ENCOUNTER — Ambulatory Visit: Payer: Medicare Other

## 2018-12-20 ENCOUNTER — Ambulatory Visit: Payer: Medicare Other | Admitting: Nurse Practitioner

## 2018-12-29 ENCOUNTER — Ambulatory Visit: Payer: Medicare Other | Admitting: Nurse Practitioner

## 2019-01-05 ENCOUNTER — Ambulatory Visit: Payer: Medicare Other | Admitting: Nurse Practitioner

## 2019-01-05 ENCOUNTER — Ambulatory Visit (INDEPENDENT_AMBULATORY_CARE_PROVIDER_SITE_OTHER): Payer: Medicare Other

## 2019-01-05 ENCOUNTER — Other Ambulatory Visit: Payer: Self-pay

## 2019-01-05 ENCOUNTER — Encounter: Payer: Self-pay | Admitting: Nurse Practitioner

## 2019-01-05 VITALS — BP 122/60 | HR 88 | Ht 65.0 in | Wt 118.0 lb

## 2019-01-05 DIAGNOSIS — I4819 Other persistent atrial fibrillation: Secondary | ICD-10-CM

## 2019-01-05 DIAGNOSIS — I4892 Unspecified atrial flutter: Secondary | ICD-10-CM

## 2019-01-05 DIAGNOSIS — E782 Mixed hyperlipidemia: Secondary | ICD-10-CM

## 2019-01-05 DIAGNOSIS — I10 Essential (primary) hypertension: Secondary | ICD-10-CM | POA: Diagnosis not present

## 2019-01-05 NOTE — Progress Notes (Signed)
Office Visit    Patient Name: CIPRIANA BILLER Date of Encounter: 01/05/2019  Primary Care Provider:  Remi Haggard, FNP Primary Cardiologist:  Kathlyn Sacramento, MD  Chief Complaint    85 year old female with a history of atrial flutter/fibrillation, hypertension, hyperlipidemia, type 2 diabetes mellitus, and mitral regurgitation, who presents for follow-up related to afib following resumption of oral anticoagulation.  Past Medical History    Past Medical History:  Diagnosis Date   Arthritis    In hands   Atrial flutter ([place])    a. s/p successful TEE/DCCV 07/31/2015; b. on Xarelto; c. CHADS2VASc => 6 (CHF, HTN, age x 2, DM, female); d. 01/2016 Amio added for Afib.   Bradycardia    a. 11/2017 noted during hospitalization @ [facility]-->amio d/c'd by cardiology.   Cataract    Chronic combined systolic (congestive) and diastolic (congestive) heart failure ([place])    a. 06/2015 Echo: EF 40%, basal HK, nl WM of mid and apical segments, mild to mod MR/TR; b. TEE 04/04/1094: nl LV systolic function, mild to mod MR, trivial TR; c. 06/2017 Echo: EF 55-60%, mild LVH, Gr2 DD, triv AI, mild MR, mod dil LA, mildly dil RA, mild to mod TR. PASP 35-79mHg.   Diabetes mellitus without complication (HCC)    Type II   GERD (gastroesophageal reflux disease)    Glaucoma    Right Eye   Hematoma of abdominal wall    a. 11/2017 Fall-->R flank hematoma w/ anemia req PRBC.   Hyperlipidemia    Hypertension    Moderate mitral regurgitation    a. see echo from 06/2015 and TEE 07/2015; b. 06/2017 Echo: Mild MR.   [place]    [place].  [facility].   Right renal mass    a. 11/2017 CT Abd ([facility]: 3.4cm R upper pole renal mass concerning for renal cell carcinoma-->outpt f/u w/ urology.   Past Surgical History:  Procedure Laterality Date   CATARACT EXTRACTION Bilateral    CESAREAN SECTION     ELECTROPHYSIOLOGIC STUDY N/A 07/31/2015   Procedure:  CARDIOVERSION;  Surgeon: AWende Bushy MD;  Location: [facility] ORS;  Service: Cardiovascular;  Laterality: N/A;   ELECTROPHYSIOLOGIC STUDY N/A 04/03/2016   Procedure: CARDIOVERSION;  Surgeon: TMinna Merritts MD;  Location: [facility] ORS;  Service: Cardiovascular;  Laterality: N/A;   TEE WITHOUT CARDIOVERSION N/A 07/31/2015   Procedure: TRANSESOPHAGEAL ECHOCARDIOGRAM (TEE);  Surgeon: AWende Bushy MD;  Location: [facility] ORS;  Service: Cardiovascular;  Laterality: N/A;   WISDOM TOOTH EXTRACTION     Wrist Surgery Left     Allergies  No Known Allergies  History of Present Illness    85year old female with above past medical history including atrial fibrillation and flutter, hypertension, hyperlipidemia, type 2 diabetes mellitus, and mitral regurgitation.  Atrial flutter was diagnosed in early 2017 and she subsequently underwent cardioversion.  She required initiation of amiodarone in November 2017 secondary to atrial fibrillation.  She was maintained on amiodarone and Xarelto therapy but in the setting of baseline bradycardia, she required discontinuation of beta-blocker therapy and amiodarone was reduced to 100 mg daily in April 2019.  Echocardiogram in April 2019 showed normal LV function with mild to moderate TR and mild MR.  In the setting of recurrent falls and right flank hematoma with significant anemia, [facility]  During hospitalization, she required 1 unit of packed red blood cells and Xarelto was placed on hold.  Amiodarone was also  discontinued in the setting of bradycardia.    Oral anticoagulation was held until her last visit in June of this year, at which time she was doing well and had not had any recurrent falls.  She was placed on Eliquis 2.5 mg twice daily.  Follow-up lab work in July showed a slight reduction in H&H from December with stable renal function.  Since her last visit, she has remained relatively active.  She "piddles" around her house and in her yard/garden for  up to an hour each day.  She walks frequently but takes breaks here and there.  She has noted some reduction in exercise tolerance over the past several months though she cannot pinpoint exactly.  She is in atrial flutter today at a rate of 88 bpm though she denies any palpitations or chest pain.  As noted, she does experience dyspnea on exertion with variable degrees of activity.  She occasionally notes lightheadedness in the mornings when her blood sugar is low.  She has not had any PND, orthopnea, syncope, edema, or early satiety.  She has been compliant with Eliquis therapy and as best she can tell, she has tolerated this.  She denies any melena or bright red blood per rectum.  Home Medications    Prior to Admission medications   Medication Sig Start Date End Date Taking? Authorizing Provider  amLODipine (NORVASC) 10 MG tablet Take 1 tablet (10 mg total) by mouth daily. 04/11/18 09/15/18  Wellington Hampshire, MD  apixaban (ELIQUIS) 2.5 MG TABS tablet Take 1 tablet (2.5 mg total) by mouth 2 (two) times daily. 09/15/18   Wellington Hampshire, MD  atorvastatin (LIPITOR) 20 MG tablet Take 1 tablet (20 mg total) by mouth at bedtime. 06/20/18   Poulose, Bethel Born, NP  Blood Pressure KIT Check blood pressure daily or as needed if tired or fatigued; dx I10 12/17/16   Lada, Satira Anis, MD  Cholecalciferol (VITAMIN D3) 1.25 MG (50000 UT) CAPS Take 1 capsule by mouth once a week. 07/20/18   [provider]  furosemide (LASIX) 20 MG tablet Take 1 tablet (20 mg total) by mouth daily. 11/03/18   Wellington Hampshire, MD  insulin glargine (LANTUS) 100 UNIT/ML injection Inject 12 Units into the skin Nightly.    [provider]  latanoprost (XALATAN) 0.005 % ophthalmic solution Place 1 drop into both eyes at bedtime.     [provider]  lisinopril (ZESTRIL) 40 MG tablet Take 1 tablet (40 mg total) by mouth daily. 10/31/18   Wellington Hampshire, MD  timolol (TIMOPTIC) 0.5 % ophthalmic solution Place 1 drop  into both eyes at bedtime.  02/11/15   [provider]    Review of Systems    Some reduction in exercise tolerance with dyspnea on exertion over the past several months though she says it is only slight.  Occasional lightheadedness in the mornings when blood glucose is low.  She denies chest pain, palpitations, PND, orthopnea, syncope, edema, or early satiety.  All other systems reviewed and are otherwise negative except as noted above.  Physical Exam    VS:  BP 122/60 (BP Location: Left Arm, Patient Position: Sitting, Cuff Size: Normal)    Pulse 88    Ht 5' 5"  (1.651 m)    Wt 118 lb (53.5 kg)    BMI 19.64 kg/m  , BMI Body mass index is 19.64 kg/m. GEN: Well nourished, well developed, in no acute distress. HEENT: normal. Neck: Supple, no JVD, carotid bruits,  or masses. Cardiac: Irregularly irregular, no murmurs, rubs, or gallops. No clubbing, cyanosis, edema.  Radials/PT 2+ and equal bilaterally.  Respiratory:  Respirations regular and unlabored, clear to auscultation bilaterally. GI: Soft, nontender, nondistended, BS + x 4. MS: no deformity or atrophy. Skin: warm and dry, no rash. Neuro:  Strength and sensation are intact. Psych: Normal affect.  Accessory Clinical Findings    ECG personally reviewed by me today -atrial flutter with variable block, 88, LVH- no acute changes.  Lab Results  Component Value Date   WBC 5.7 09/29/2018   HGB 11.6 (L) 09/29/2018   HCT 34.5 (L) 09/29/2018   MCV 97.7 09/29/2018   PLT 160 09/29/2018   Lab Results  Component Value Date   CREATININE 0.68 09/29/2018   BUN 17 09/29/2018   NA 142 09/29/2018   K 3.7 09/29/2018   CL 106 09/29/2018   CO2 27 09/29/2018   Lab Results  Component Value Date   ALT 22 12/06/2017   AST 33 12/06/2017   ALKPHOS 57 12/06/2017   BILITOT 1.4 (H) 12/06/2017   Lab Results  Component Value Date   CHOL 132 06/21/2017   HDL 55 06/21/2017   LDLCALC 59 06/21/2017   TRIG 97 06/21/2017   CHOLHDL 2.4  06/21/2017     Assessment & Plan    1.  Recurrent atrial flutter/persistent atrial fibrillation: Patient with a history of atrial flutter and atrial fibrillation status post cardioversion for atrial flutter in 2017 with subsequent initiation of amiodarone for A. fib which developed later.  Both beta-blocker and amiodarone were subsequently discontinued in 2019 in the setting of ongoing bradycardia.  She had been off of anticoagulation between September 2019 and July of this year, when low-dose Eliquis was resumed (CHA2DS2VASc equals 6).  She has tolerated Eliquis to the best of her knowledge.  Unfortunately, she is back in atrial flutter today.  She has not had any palpitations but has noticed some drop in exercise tolerance over the past several months though she cannot pinpoint exactly how long.  She is euvolemic on examination today.  I note that echocardiogram when in atrial flutter before showed an EF of 40% which subsequently normalized after restoration of sinus rhythm.  We discussed options for management and I also discussed her case with Dr. Fletcher Anon today.  I will check a CBC, basic metabolic panel, magnesium, TSH, and echocardiogram.  I am going to place a 14-day ZIO monitor to assess her burden of atrial flutter as well as heart rate excursion and propensity for tachycardia.  If heart rates are stable, without bradycardia during monitoring, we can likely add back a low-dose of beta-blocker and simply plan for rate control - provided she doesn't develop worsening HF symptoms or recurrent cardiomyopathy.  If however, she develops recurrent bradycardia, or EF drops/she develops CHF Ss, we will need to consider EP referral for pacemaker given need for rhythm mgmt and known bradycardia when in sinus rhythm on antiarrhythmic therapy and/or beta-blocker in the past with presumption of tachybrady syndrome.  2.  Essential hypertension: Stable on ACE inhibitor therapy.  3.  Hyperlipidemia: LDL of 59 March  2019 with normal LFTs a year ago.  She remains on statin therapy.  4.  Type 2 diabetes mellitus: On insulin.  Occasionally develops hypoglycemia with associated lightheadedness in the mornings which resolves by drinking orange juice.  A1c was 6.2 in March 2019.  This is followed by primary care.    5.  Disposition: Follow-up lab work, echo,  and Zio.  Follow-up in 1 month.   Murray Hodgkins, NP 01/05/2019, 9:44 AM

## 2019-01-05 NOTE — Patient Instructions (Signed)
Medication Instructions:  1- Your physician recommends that you continue on your current medications as directed. Please refer to the Current Medication list given to you today. If you need a refill on your cardiac medications before your next appointment, please call your pharmacy.   Lab work: Your physician recommends that you have lab work today(CBC, BMET, Mag, Tsh)  If you have labs (blood work) drawn today and your tests are completely normal, you will receive your results only by: Marland Kitchen MyChart Message (if you have MyChart) OR . A paper copy in the mail If you have any lab test that is abnormal or we need to change your treatment, we will call you to review the results.  Testing/Procedures: 1- Echo  [facility] return to [facility] on ______________ at _______________ AM/PM for an Echocardiogram. Your physician has requested that you have an echocardiogram. Echocardiography is a painless test that uses sound waves to create images of your heart. It provides your doctor with information about the size and shape of your heart and how well your heart's chambers and valves are working. This procedure takes approximately one hour. There are no restrictions for this procedure. Please note; depending on visual quality an IV may need to be placed.   2- A zio monitor was placed today. It will remain on for 14 days. You will then return monitor and event diary in provided box. It takes 1-2 weeks for report to be downloaded and returned to [place]. We will call you with the results. If monitor falls of or has orange flashing light, please call Zio for further instructions.    Follow-Up: At [facility], you and your health needs are our priority.  As part of our continuing mission to provide you with exceptional heart care, we have created designated Provider Care Teams.  These Care Teams include your primary Cardiologist (physician) and Advanced Practice Providers (APPs -  Physician Assistants  and Nurse Practitioners) who all work together to provide you with the care you need, when you need it. You will need a follow up appointment in 1 months. You may see Kathlyn Sacramento, MD or Murray Hodgkins, NP.

## 2019-01-06 LAB — BASIC METABOLIC PANEL
BUN/Creatinine Ratio: 22 (ref 12–28)
BUN: 13 mg/dL (ref 8–27)
CO2: 25 mmol/L (ref 20–29)
Calcium: 9.6 mg/dL (ref 8.7–10.3)
Chloride: 105 mmol/L (ref 96–106)
Creatinine, Ser: 0.6 mg/dL (ref 0.57–1.00)
GFR calc Af Amer: 96 mL/min/{1.73_m2} (ref >59)
GFR calc non Af Amer: 83 mL/min/{1.73_m2} (ref >59)
Glucose: 110 mg/dL — ABNORMAL HIGH (ref 65–99)
Potassium: 4 mmol/L (ref 3.5–5.2)
Sodium: 142 mmol/L (ref 134–144)

## 2019-01-06 LAB — CBC
Hematocrit: 35.7 % (ref 34.0–46.6)
Hemoglobin: 12.2 g/dL (ref 11.1–15.9)
MCH: 32.8 pg (ref 26.6–33.0)
MCHC: 34.2 g/dL (ref 31.5–35.7)
MCV: 96 fL (ref 79–97)
Platelets: 162 10*3/uL (ref 150–450)
RBC: 3.72 x10E6/uL — ABNORMAL LOW (ref 3.77–5.28)
RDW: 12.2 % (ref 11.7–15.4)
WBC: 5.9 10*3/uL (ref 3.4–10.8)

## 2019-01-06 LAB — TSH: TSH: 2.99 u[IU]/mL (ref 0.450–4.500)

## 2019-01-06 LAB — MAGNESIUM: Magnesium: 2.1 mg/dL (ref 1.6–2.3)

## 2019-01-18 ENCOUNTER — Encounter: Payer: Self-pay | Admitting: Family Medicine

## 2019-01-30 ENCOUNTER — Telehealth: Payer: Self-pay

## 2019-01-30 MED ORDER — METOPROLOL TARTRATE 25 MG PO TABS: 25.0000 mg | ORAL_TABLET | Freq: Two times a day (BID) | ORAL | 3 refills | Status: DC

## 2019-01-30 NOTE — Telephone Encounter (Signed)
Call to patient to discuss results from monitor.   She verbalized understanding. Rx for metoprolol sent to pt preferred pharmacy.   Appt confirmed for next week.   Advised pt to call for any further questions or concerns.

## 2019-01-30 NOTE — Telephone Encounter (Signed)
-----   Message from Theora Gianotti, NP sent at 01/30/2019  8:24 AM EST ----- Average hr of 73.  Low of 41 (early am) and max of 176. Please add metoprolol 25mg  bid.  Keep f/u next week.

## 2019-02-09 ENCOUNTER — Ambulatory Visit: Payer: Medicare Other | Admitting: Nurse Practitioner

## 2019-02-10 ENCOUNTER — Telehealth: Payer: Self-pay | Admitting: Cardiovascular Disease

## 2019-02-10 NOTE — Telephone Encounter (Signed)
Renee from [employer] calling Needs to clarify if patient is still taking Eliquis medication Please call to discuss at [phone]

## 2019-02-10 NOTE — Telephone Encounter (Signed)
Returned the call to [employer]. Renee sts that the patient has picked up her Metoprolol prescription but refused her refill for Eliquis. Patient thinks that Metoprolol replaced Eliquis. Renee explained to the patient that they are 2 different medications. Renee sts that the patient has been out of Eliquis for several days.  Confirmed with Ignacia Bayley, NP that the patient needs to be on Eliquis 2.5mg  bid.  Spoke with the patient and advised her that she needs to be on both Eliquis 2.5mg  bid and Metoprolol 25mg  bid. Patient sts that she had gotten confused. Advised the patient to pick up her Eliquis Rx and resume asap. Patient sts that she will and voiced appreciation for the call.  Called Renee @ [facility] and asked her to refill the Eliquis for the patient to pick up asap. Renee voiced appreciation for the call back.

## 2019-02-15 ENCOUNTER — Ambulatory Visit (INDEPENDENT_AMBULATORY_CARE_PROVIDER_SITE_OTHER): Payer: Medicare Other

## 2019-02-15 ENCOUNTER — Other Ambulatory Visit: Payer: Self-pay

## 2019-02-15 DIAGNOSIS — I4892 Unspecified atrial flutter: Secondary | ICD-10-CM

## 2019-02-16 ENCOUNTER — Telehealth: Payer: Self-pay

## 2019-02-16 ENCOUNTER — Other Ambulatory Visit: Payer: Medicare Other

## 2019-02-16 NOTE — Telephone Encounter (Signed)
Call to patient to discuss results of recent Echo. Pt verbalized understanding and upcoming appt confirmed.   No further orders or questions at this time.   Advised pt to call for any further questions or concerns.

## 2019-02-16 NOTE — Telephone Encounter (Signed)
-----   Message from Theora Gianotti, NP sent at 02/16/2019  5:39 AM EST ----- Despite recurrent atrial arrhythmia, heart squeezing function remains normal.  This is great news.  Minimally leaky mitral and tricuspid valves.  Overall, reassuring study.

## 2019-02-21 ENCOUNTER — Encounter: Payer: Self-pay | Admitting: Nurse Practitioner

## 2019-02-21 ENCOUNTER — Ambulatory Visit (INDEPENDENT_AMBULATORY_CARE_PROVIDER_SITE_OTHER): Payer: Medicare Other | Admitting: Nurse Practitioner

## 2019-02-21 ENCOUNTER — Other Ambulatory Visit: Payer: Self-pay

## 2019-02-21 VITALS — BP 116/70 | HR 55 | Temp 97.3°F | Ht 64.0 in | Wt 120.5 lb

## 2019-02-21 DIAGNOSIS — E782 Mixed hyperlipidemia: Secondary | ICD-10-CM

## 2019-02-21 DIAGNOSIS — I10 Essential (primary) hypertension: Secondary | ICD-10-CM | POA: Diagnosis not present

## 2019-02-21 DIAGNOSIS — I4819 Other persistent atrial fibrillation: Secondary | ICD-10-CM | POA: Diagnosis not present

## 2019-02-21 DIAGNOSIS — I4892 Unspecified atrial flutter: Secondary | ICD-10-CM | POA: Diagnosis not present

## 2019-02-21 NOTE — Patient Instructions (Signed)
Medication Instructions:  Your physician recommends that you continue on your current medications as directed. Please refer to the Current Medication list given to you today.  *If you need a refill on your cardiac medications before your next appointment, please call your pharmacy*  Lab Work: None ordered  If you have labs (blood work) drawn today and your tests are completely normal, you will receive your results only by: . MyChart Message (if you have MyChart) OR . A paper copy in the mail If you have any lab test that is abnormal or we need to change your treatment, we will call you to review the results.  Testing/Procedures: None ordered   Follow-Up: At [facility], you and your health needs are our priority.  As part of our continuing mission to provide you with exceptional heart care, we have created designated Provider Care Teams.  These Care Teams include your primary Cardiologist (physician) and Advanced Practice Providers (APPs -  Physician Assistants and Nurse Practitioners) who all work together to provide you with the care you need, when you need it.  Your next appointment:   3 month(s)  The format for your next appointment:   In Person  Provider:    You may see Muhammad Arida, MD or Christopher Berge, NP.   

## 2019-02-21 NOTE — Progress Notes (Signed)
Office Visit    Patient Name: Amber Wolfe Date of Encounter: 02/21/2019  Primary Care Provider:  Remi Haggard, FNP Primary Cardiologist:  Kathlyn Sacramento, MD  Chief Complaint    85 year old female with a history of atrial flutter/fibrillation, hypertension, hyperlipidemia, type 2 diabetes mellitus, and mitral rotation, who presents for follow-up related to atrial fibrillation.  Past Medical History    Past Medical History:  Diagnosis Date  . Arthritis    In hands  . Atrial flutter ([place])    a. s/p successful TEE/DCCV 07/31/2015; b. 01/2016 Amio added for Afib; c. 11/2017 Amio d/c'd 2/2 bradycardia; d. 12/2018 Back in aflutter-->Zio: Avg HR 74 (41-176) 100% Afl->bb added back; e. CHADS2VASc => 6 (CHF, HTN, age x 2, DM, female)-->eliquis 2.5 bid.  . Bradycardia    a. 11/2017 noted during hospitalization @ [facility]-->amio d/c'd by cardiology.  . Cataract   . Chronic combined systolic (congestive) and diastolic (congestive) heart failure ([place])    a. 06/2015 Echo: EF 40%, basal HK, nl WM of mid and apical segments, mild to mod MR/TR; b. TEE 08/31/1581: nl LV systolic function, mild to mod MR, trivial TR; c. 06/2017 Echo: EF 55-60%, mild LVH, Gr2 DD, triv AI, mild MR, mod dil LA, mildly dil RA, mild to mod TR. PASP 35-71mHg; d. 01/2019 Echo: Ef 55-60%, mod dil LA mildly dil RA. Mild TR. Trace MR.  . Diabetes mellitus without complication (HCC)    Type II  . GERD (gastroesophageal reflux disease)   . Glaucoma    Right Eye  . Hematoma of abdominal wall    a. 11/2017 Fall-->R flank hematoma w/ anemia req PRBC. Xarelto dc'd at that time.  . Hyperlipidemia   . Hypertension   . Moderate mitral regurgitation    a. see echo from 06/2015 and TEE 07/2015; b. 06/2017 Echo: Mild MR.  .Marland KitchenPAF (paroxysmal atrial fibrillation) ([place]    a.  [facility] (later dc'd 11/2017 2/2 bradycardia).  . Right renal mass    a. 11/2017 CT Abd ([facility]: 3.4cm R upper pole renal mass  concerning for renal cell carcinoma-->outpt f/u w/ urology.   Past Surgical History:  Procedure Laterality Date  . CATARACT EXTRACTION Bilateral   . CESAREAN SECTION    . ELECTROPHYSIOLOGIC STUDY N/A 07/31/2015   Procedure: CARDIOVERSION;  Surgeon: AWende Bushy MD;  Location: [facility] ORS;  Service: Cardiovascular;  Laterality: N/A;  . ELECTROPHYSIOLOGIC STUDY N/A 04/03/2016   Procedure: CARDIOVERSION;  Surgeon: TMinna Merritts MD;  Location: [facility] ORS;  Service: Cardiovascular;  Laterality: N/A;  . TEE WITHOUT CARDIOVERSION N/A 07/31/2015   Procedure: TRANSESOPHAGEAL ECHOCARDIOGRAM (TEE);  Surgeon: AWende Bushy MD;  Location: [facility] ORS;  Service: Cardiovascular;  Laterality: N/A;  . WISDOM TOOTH EXTRACTION    . Wrist Surgery Left     Allergies  No Known Allergies  History of Present Illness    85year old female with the above past medical history including atrial fibrillation and flutter, hypertension, hyperlipidemia, type 2 diabetes mellitus, and mild regurgitation.  Atrial flutter was diagnosed in early 2017 and she subsequently underwent cardioversion.  She required initiation of amiodarone in November 2017, secondary to atrial fibrillation.  She was maintained on amiodarone and Xarelto therapy but in the setting of baseline bradycardia, she required discontinuation of beta-blocker therapy and amiodarone was reduced to 100 mg daily in April 2019.  Echocardiogram in April 2019 showed normal LV function with mild to moderate TR and mild MR.  In the setting of recurrent falls and right flank hematoma with significant anemia, she was hospitalized at [facility] in September 2019.  During hospitalization, she required 1 unit of packed red blood cells and Xarelto was placed on hold.  Amiodarone was also discontinued in the setting of bradycardia.  Oral anticoagulation was held until June 2020, at which time she was doing well and had not had any recurrent falls.  She was placed on Eliquis 2.5 mg twice daily  with stable H&H and renal function by follow-up labs in July and October.  She was last in the clinic on October 8 and was noted be back in rate controlled atrial flutter.  Out of concern for previous drop in EF to 25% when in atrial flutter, and echo was obtained on November 18, showing normal LV function with an EF of 55 to 60%, without any significant valvular disease.  I also placed a ZIO monitor to assess heart rate given prior history of bradycardia.  Average heart rate 73 bpm with a low of 41 mostly occurring during hours of sleep.  Highest rate was 176 bpm and she remained in A. fib/flutter throughout the monitoring period.  In the setting of monitor findings, she was placed on metoprolol 25 mg twice daily.  Since her last visit, she has felt well.  She remains active around her home, walking in her house and sweeping the deck outside.  She has not noticed any change in her exercise tolerance.  She denies chest pain, dyspnea, palpitations, PND, orthopnea, dizziness, syncope, edema, or early satiety.  Home Medications    Prior to Admission medications   Medication Sig Start Date End Date Taking? Authorizing Provider  amLODipine (NORVASC) 10 MG tablet Take 1 tablet (10 mg total) by mouth daily. 04/11/18 01/05/19  Wellington Hampshire, MD  apixaban (ELIQUIS) 2.5 MG TABS tablet Take 1 tablet (2.5 mg total) by mouth 2 (two) times daily. 09/15/18   Wellington Hampshire, MD  atorvastatin (LIPITOR) 20 MG tablet Take 1 tablet (20 mg total) by mouth at bedtime. 06/20/18   Poulose, Bethel Born, NP  Blood Pressure KIT Check blood pressure daily or as needed if tired or fatigued; dx I10 12/17/16   Lada, Satira Anis, MD  Cholecalciferol (VITAMIN D3) 1.25 MG (50000 UT) CAPS Take 1 capsule by mouth once a week. 07/20/18   [provider]  furosemide (LASIX) 20 MG tablet Take 1 tablet (20 mg total) by mouth daily. 11/03/18   Wellington Hampshire, MD  insulin glargine (LANTUS) 100 UNIT/ML injection Inject 12 Units into  the skin Nightly.    [provider]  latanoprost (XALATAN) 0.005 % ophthalmic solution Place 1 drop into both eyes at bedtime.     [provider]  lisinopril (ZESTRIL) 40 MG tablet Take 1 tablet (40 mg total) by mouth daily. 10/31/18   Wellington Hampshire, MD  metoprolol tartrate (LOPRESSOR) 25 MG tablet Take 1 tablet (25 mg total) by mouth 2 (two) times daily. 01/30/19 04/30/19  Theora Gianotti, NP  timolol (TIMOPTIC) 0.5 % ophthalmic solution Place 1 drop into both eyes at bedtime.  02/11/15   [provider]    Review of Systems    She denies chest pain, palpitations, dyspnea, pnd, orthopnea, n, v, dizziness, syncope, edema, weight gain, or early satiety.  All other systems reviewed and are otherwise negative except as noted above.  Physical Exam    VS:  BP 116/70 (BP Location: Left Arm, Patient Position: Sitting,  Cuff Size: Normal)   Pulse (!) 55   Temp (!) 97.3 F (36.3 C)   Ht 5' 4" (1.626 m)   Wt 120 lb 8 oz (54.7 kg)   SpO2 99%   BMI 20.68 kg/m  , BMI Body mass index is 20.68 kg/m. GEN: Well nourished, well developed, in no acute distress. HEENT: normal. Neck: Supple, no JVD, carotid bruits, or masses. Cardiac: IR, IR, no murmurs, rubs, or gallops. No clubbing, cyanosis, edema.  Radials/PT 2+ and equal bilaterally.  Respiratory:  Respirations regular and unlabored, clear to auscultation bilaterally. GI: Soft, nontender, nondistended, BS + x 4. MS: no deformity or atrophy. Skin: warm and dry, no rash. Neuro:  Strength and sensation are intact. Psych: Normal affect.  Accessory Clinical Findings    ECG personally reviewed by me today -atrial fibrillation, 55, nonspecific ST changes - no acute changes.  Lab Results  Component Value Date   WBC 5.9 01/05/2019   HGB 12.2 01/05/2019   HCT 35.7 01/05/2019   MCV 96 01/05/2019   PLT 162 01/05/2019   Lab Results  Component Value Date   CREATININE 0.60 01/05/2019   BUN 13 01/05/2019   NA  142 01/05/2019   K 4.0 01/05/2019   CL 105 01/05/2019   CO2 25 01/05/2019   Lab Results  Component Value Date   ALT 22 12/06/2017   AST 33 12/06/2017   ALKPHOS 57 12/06/2017   BILITOT 1.4 (H) 12/06/2017   Lab Results  Component Value Date   CHOL 132 06/21/2017   HDL 55 06/21/2017   LDLCALC 59 06/21/2017   TRIG 97 06/21/2017   CHOLHDL 2.4 06/21/2017     Assessment & Plan    1.  Persistent A. fib/flutter: Patient with a history of atrial flutter and fibrillation status post cardioversion for atrial flutter in 2017 with subsequent initiation of amiodarone for A. fib, which developed later.  Both beta-blocker and amiodarone were previously discontinued in 2019 in the setting of bradycardia.  She had also been off of anticoagulation between September 2019 and July 2020 following a fall and right flank hematoma.  She is currently on Eliquis 2.5 mg twice daily.  She was found to be back in atrial flutter on October 8.  Follow-up echo showed normal LV function.  Zio monitoring showed an average heart rate of 74 with a max heart rate of 176 and a low heart rate of 41, occurring during probable periods of sleep.  She was placed on metoprolol 25 mg twice daily following monitoring and her heart rate is stable today at 55 bpm.  She has been asymptomatic without any change in activity tolerance.  Continue current regimen.  I have recommended that she consider obtaining a blood pressure cuff or pulse oximeter simply to have in order to check her heart rate periodically, especially if she has episodes of lightheadedness or fatigue.  2.  Essential hypertension: Stable on beta-blocker, ACE inhibitor, and amlodipine therapy.  3.  Hyperlipidemia: Recently had lipids with primary care on October 21.  LDL was 60.  LFTs within normal limits.  She remains on statin therapy.  4.  Type 2 diabetes mellitus: On insulin.  A1c 6.0 in October.  5.  History of cardiomyopathy: EF previously 40% when in fib flutter in  the past.  Recent echo showed stable LV function with an EF of 55 to 60% despite arrhythmia.  She is euvolemic on examination and has not noticed any change in activity tolerance.  She remains  on beta-blocker and ACE inhibitor therapy.  6.  Disposition: Follow-up in clinic in 3 months or sooner if necessary.  Murray Hodgkins, NP 02/21/2019, 8:48 AM

## 2019-03-13 ENCOUNTER — Other Ambulatory Visit: Payer: Self-pay | Admitting: Cardiovascular Disease

## 2019-03-13 NOTE — Telephone Encounter (Signed)
Refill Request.  

## 2019-03-13 NOTE — Telephone Encounter (Signed)
Pt's age 85, wt 54.7 kg, SCR 0.6, CrCl 59.2, last ov w/ CB 02/21/19.

## 2019-04-24 ENCOUNTER — Other Ambulatory Visit: Payer: Self-pay | Admitting: Cardiovascular Disease

## 2019-04-27 ENCOUNTER — Telehealth: Payer: Self-pay | Admitting: Cardiovascular Disease

## 2019-04-27 NOTE — Telephone Encounter (Signed)
Patient daughter calling States that Eliquis medication is getting too expensive to afford  Please call to discuss other options

## 2019-04-27 NOTE — Telephone Encounter (Signed)
  DPR on file. Spoke with the patient daughter Lynelle Smoke. [name] sts that the patients out of pocket cost for Eliquis is $140 a month. The patient will not be able to afford the medication.  Advised [name] that there are other medications in the same class like Xarelto, Pradaxa. She could contact the patients insurance co to see if they have a preferred anticoag medication that could be on a lower Tier and more affordable.  The only generic option would be Coumadin which would require monitoring.  The patient could also try applying for Patient assistance through the [employer].  [name] ask if any samples of Eliquis can be provided. Advised [name] that samples will be left at the front desk for pick up and that I will also include the application for patient assistance.  [name] verbalized understanding and voiced appreciation for the assistance.   Medication samples have been provided to the patient.   Drug name: Eliquis      Strength: 2.5 mg        Qty: 3 boxes                   LOT: [id number] Exp.Date: Dec 2021

## 2019-05-05 ENCOUNTER — Other Ambulatory Visit: Payer: Self-pay | Admitting: Cardiovascular Disease

## 2019-05-05 NOTE — Telephone Encounter (Signed)
This is a [place] pt. Please address 

## 2019-05-08 ENCOUNTER — Telehealth: Payer: Self-pay | Admitting: Cardiovascular Disease

## 2019-05-08 MED ORDER — ATORVASTATIN CALCIUM 20 MG PO TABS: 20.0000 mg | ORAL_TABLET | Freq: Every day | ORAL | 11 refills | Status: DC

## 2019-05-08 NOTE — Telephone Encounter (Signed)
Please advise patient refill. It was last refilled by Fredderick Severance, NP. Thanks.

## 2019-05-08 NOTE — Telephone Encounter (Signed)
*  STAT* If patient is at the pharmacy, call can be transferred to refill team.   1. Which medications need to be refilled? (please list name of each medication and dose if known) new prescription for Atorvastatin 2. Which pharmacy/location (including street and city if local pharmacy) is medication to be sent to? [place] [phone]  3. Do they need a 30 day or 90 day supply? 90 days and refills

## 2019-05-09 NOTE — Telephone Encounter (Signed)
Rx has been sent to the pharmacy electronically. ° °

## 2019-05-10 ENCOUNTER — Telehealth: Payer: Self-pay

## 2019-05-10 MED ORDER — ATORVASTATIN CALCIUM 20 MG PO TABS: 20.0000 mg | ORAL_TABLET | Freq: Every day | ORAL | 3 refills | Status: DC

## 2019-05-10 NOTE — Telephone Encounter (Signed)
Incoming call to [facility] from personnel at [facility] requesting that Rx for pt atorvastatin be sent as 90 day supply vs 30 day supply. Updated Rx resent.

## 2019-05-25 ENCOUNTER — Other Ambulatory Visit: Payer: Self-pay

## 2019-05-25 ENCOUNTER — Ambulatory Visit (INDEPENDENT_AMBULATORY_CARE_PROVIDER_SITE_OTHER): Payer: Medicare Other | Admitting: Cardiovascular Disease

## 2019-05-25 ENCOUNTER — Encounter: Payer: Self-pay | Admitting: Cardiovascular Disease

## 2019-05-25 VITALS — BP 110/52 | HR 61 | Ht 63.0 in | Wt 125.8 lb

## 2019-05-25 DIAGNOSIS — I10 Essential (primary) hypertension: Secondary | ICD-10-CM | POA: Diagnosis not present

## 2019-05-25 DIAGNOSIS — I4821 Permanent atrial fibrillation: Secondary | ICD-10-CM

## 2019-05-25 DIAGNOSIS — E7849 Other hyperlipidemia: Secondary | ICD-10-CM

## 2019-05-25 NOTE — Patient Instructions (Signed)

## 2019-05-25 NOTE — Progress Notes (Signed)
Cardiology Office Note   Date:  05/25/2019   ID:  Amber Wolfe, DOB 04-Dec-1931, MRN [id number]  PCP:  Remi Haggard, FNP  Cardiologist:   Kathlyn Sacramento, MD   Chief Complaint  Patient presents with  . office visit    Pt concern with bradycardia. Meds verbally reviewed w/ pt.      History of Present Illness: Amber Wolfe is a 86 y.o. female who presents for a follow-up visit regarding chronic atrial flutter/fibrillation.  Other medical problems include diabetes mellitus, hypertension, hyperlipidemia and moderate mitral regurgitation. She had a nuclear stress test done in March 2017 which showed no evidence of ischemia or perfusion defects with normal ejection fraction. She had mildly reduced ejection fraction the setting of atrial fibrillation and tachycardia that subsequently normalized.   She had severe symptomatic bradycardia in the setting of treatment with a beta-blocker and even with amiodarone monotherapy at 100 mg daily.   She was hospitalized in September 2019 after a fall that was complicated by large flank hematoma.  Xarelto was stopped at that time.  Subsequently, I elected to start her on small dose Eliquis 2.5 mg twice daily. Most recent echocardiogram in November 2020 showed an EF of 55 to 60% with trace mitral regurgitation.  She had a ZIO monitor done in October which showed an average heart rate of 73 bpm with intermittent tachycardia and occasional heart rate up to 176 bpm.  Metoprolol 25 mg twice daily was resumed.  She has been doing well with no recent shortness of breath or palpitations.  She describes occasional substernal chest tightness lasting less than a minute.  No dizziness or falls.  Past Medical History:  Diagnosis Date  . Arthritis    In hands  . Atrial flutter ([place])    a. s/p successful TEE/DCCV 07/31/2015; b. 01/2016 Amio added for Afib; c. 11/2017 Amio d/c'd 2/2 bradycardia; d. 12/2018 Back in aflutter-->Zio: Avg HR 74 (41-176) 100% Afl->bb added  back; e. CHADS2VASc => 6 (CHF, HTN, age x 2, DM, female)-->eliquis 2.5 bid.  . Bradycardia    a. 11/2017 noted during hospitalization @ [facility]-->amio d/c'd by cardiology.  . Cataract   . Chronic combined systolic (congestive) and diastolic (congestive) heart failure ([place])    a. 06/2015 Echo: EF 40%, basal HK, nl WM of mid and apical segments, mild to mod MR/TR; b. TEE 04/01/2438: nl LV systolic function, mild to mod MR, trivial TR; c. 06/2017 Echo: EF 55-60%, mild LVH, Gr2 DD, triv AI, mild MR, mod dil LA, mildly dil RA, mild to mod TR. PASP 35-86mHg; d. 01/2019 Echo: Ef 55-60%, mod dil LA mildly dil RA. Mild TR. Trace MR.  . Diabetes mellitus without complication (HCC)    Type II  . GERD (gastroesophageal reflux disease)   . Glaucoma    Right Eye  . Hematoma of abdominal wall    a. 11/2017 Fall-->R flank hematoma w/ anemia req PRBC. Xarelto dc'd at that time.  . Hyperlipidemia   . Hypertension   . Moderate mitral regurgitation    a. see echo from 06/2015 and TEE 07/2015; b. 06/2017 Echo: Mild MR.  .Marland KitchenPAF (paroxysmal atrial fibrillation) ([place]    a.  [facility] (later dc'd 11/2017 2/2 bradycardia).  . Right renal mass    a. 11/2017 CT Abd ([facility]: 3.4cm R upper pole renal mass concerning for renal cell carcinoma-->outpt f/u w/ urology.    Past Surgical History:  Procedure Laterality  Date  . CATARACT EXTRACTION Bilateral   . CESAREAN SECTION    . ELECTROPHYSIOLOGIC STUDY N/A 07/31/2015   Procedure: CARDIOVERSION;  Surgeon: Wende Bushy, MD;  Location: [facility] ORS;  Service: Cardiovascular;  Laterality: N/A;  . ELECTROPHYSIOLOGIC STUDY N/A 04/03/2016   Procedure: CARDIOVERSION;  Surgeon: Minna Merritts, MD;  Location: [facility] ORS;  Service: Cardiovascular;  Laterality: N/A;  . TEE WITHOUT CARDIOVERSION N/A 07/31/2015   Procedure: TRANSESOPHAGEAL ECHOCARDIOGRAM (TEE);  Surgeon: Wende Bushy, MD;  Location: [facility] ORS;  Service: Cardiovascular;  Laterality: N/A;  . WISDOM TOOTH  EXTRACTION    . Wrist Surgery Left      Current Outpatient Medications  Medication Sig Dispense Refill  . amLODipine (NORVASC) 10 MG tablet TAKE ONE TABLET BY MOUTH ONCE DAILY 90 tablet 0  . atorvastatin (LIPITOR) 20 MG tablet Take 1 tablet (20 mg total) by mouth at bedtime. 90 tablet 3  . Blood Pressure KIT Check blood pressure daily or as needed if tired or fatigued; dx I10 1 each 0  . Cholecalciferol (VITAMIN D3) 1.25 MG (50000 UT) CAPS Take 1 capsule by mouth once a week.    Marland Kitchen ELIQUIS 2.5 MG TABS tablet TAKE ONE TABLET BY MOUTH TWICE DAILY 180 tablet 1  . furosemide (LASIX) 20 MG tablet Take 1 tablet (20 mg total) by mouth daily. 30 tablet 2  . insulin glargine (LANTUS) 100 UNIT/ML injection Inject 12 Units into the skin Nightly.    Marland Kitchen lisinopril (ZESTRIL) 40 MG tablet Take 1 tablet (40 mg total) by mouth daily. 90 tablet 3  . metoprolol tartrate (LOPRESSOR) 25 MG tablet Take 1 tablet (25 mg total) by mouth 2 (two) times daily. 180 tablet 3   No current facility-administered medications for this visit.    Allergies:   Patient has no known allergies.    Social History:  The patient  reports that she has never smoked. She has never used smokeless tobacco. She reports that she does not drink alcohol or use drugs.   Family History:  The patient's family history includes Aneurysm in her daughter; Cataracts in her mother; Heart attack in her brother, father, maternal grandfather, mother, and paternal grandfather.    ROS:  Please see the history of present illness.   Otherwise, review of systems are positive for none.   All other systems are reviewed and negative.    PHYSICAL EXAM: VS:  BP (!) 110/52 (BP Location: Left Arm, Patient Position: Sitting, Cuff Size: Normal)   Pulse 61   Ht 5' 3"  (1.6 m)   Wt 125 lb 12 oz (57 kg)   SpO2 98%   BMI 22.28 kg/m  , BMI Body mass index is 22.28 kg/m. GEN: Well nourished, well developed, in no acute distress  HEENT: normal  Neck: no JVD,  carotid bruits, or masses Cardiac: Irregularly irregular; no  rubs, or gallops,no edema . 2/6 crescendo decrescendo systolic murmur in the aortic area which is early peaking Respiratory:  clear to auscultation bilaterally, normal work of breathing GI: soft, nontender, nondistended, + BS MS: no deformity or atrophy  Skin: warm and dry, no rash Neuro:  Strength and sensation are intact Psych: euthymic mood, full affect   EKG:  EKG is ordered today. The ekg ordered today demonstrates atrial fibrillation with ventricular rate of 61 bpm.  Recent Labs: 01/05/2019: BUN 13; Creatinine, Ser 0.60; Hemoglobin 12.2; Magnesium 2.1; Platelets 162; Potassium 4.0; Sodium 142; TSH 2.990    Lipid Panel    Component Value Date/Time  CHOL 132 06/21/2017 1414   CHOL 159 05/15/2015 1619   TRIG 97 06/21/2017 1414   TRIG 124 05/15/2015 1619   HDL 55 06/21/2017 1414   CHOLHDL 2.4 06/21/2017 1414   VLDL 15 06/15/2016 1459   VLDL 25 05/15/2015 1619   LDLCALC 59 06/21/2017 1414      Wt Readings from Last 3 Encounters:  05/25/19 125 lb 12 oz (57 kg)  02/21/19 120 lb 8 oz (54.7 kg)  01/05/19 118 lb (53.5 kg)      No flowsheet data found.    ASSESSMENT AND PLAN:  1.  Chronic atrial fibrillation: Ventricular rate is well controlled on metoprolol 25 mg twice daily with no symptoms suggestive of bradycardia.  She is tolerating anticoagulation with small dose Eliquis with no recurrent falls.  I reviewed the labs done with her primary care physician in January which showed normal renal function and CBC. She was having difficulty affording Eliquis when she was in the donut hole but her co-pay went down and that is not an issue at the present time  2. Essential hypertension: Blood pressure is controlled on current medications  3. Hyperlipidemia: I reviewed lipid profile done in January which showed an LDL of 61.  She is currently on atorvastatin.  4.  History of cardiomyopathy and moderate mitral  regurgitation: Most recent echocardiogram in November showed normal EF and only trace mitral regurgitation    Disposition:   FU with me in 6 months  Signed,  Kathlyn Sacramento, MD  05/25/2019 8:33 AM    [place]

## 2019-07-27 ENCOUNTER — Other Ambulatory Visit: Payer: Self-pay

## 2019-07-27 MED ORDER — AMLODIPINE BESYLATE 10 MG PO TABS: 10.0000 mg | ORAL_TABLET | Freq: Every day | ORAL | 4 refills | Status: DC

## 2019-07-27 NOTE — Telephone Encounter (Signed)
*  STAT* If patient is at the pharmacy, call can be transferred to refill team.   1. Which medications need to be refilled? (please list name of each medication and dose if known)  Amlodipine  2. Which pharmacy/location (including street and city if local pharmacy) is medication to be sent to? [place]  3. Do they need a 30 day or 90 day supply? [place]

## 2019-09-28 ENCOUNTER — Other Ambulatory Visit: Payer: Self-pay | Admitting: Cardiovascular Disease

## 2019-09-28 NOTE — Telephone Encounter (Signed)
Refill request

## 2019-09-28 NOTE — Telephone Encounter (Signed)
Prescription refill request for Eliquis received.  Last office visit: Amber Wolfe, 05/25/2019 Scr: 0.61, 01/18/2019 Age: 86 y.o. Weight: 57 kg   Prescription refill sent.

## 2019-10-24 ENCOUNTER — Other Ambulatory Visit: Payer: Self-pay

## 2019-10-24 MED ORDER — LISINOPRIL 40 MG PO TABS: 40.0000 mg | ORAL_TABLET | Freq: Every day | ORAL | 0 refills | Status: DC

## 2019-10-24 NOTE — Telephone Encounter (Signed)
This is a [place] pt 

## 2019-12-07 ENCOUNTER — Encounter: Payer: Self-pay | Admitting: Cardiovascular Disease

## 2019-12-07 ENCOUNTER — Ambulatory Visit: Payer: Medicare Other | Admitting: Cardiovascular Disease

## 2019-12-07 ENCOUNTER — Other Ambulatory Visit: Payer: Self-pay

## 2019-12-07 VITALS — BP 120/60 | HR 73 | Ht 65.0 in | Wt 137.5 lb

## 2019-12-07 DIAGNOSIS — E7849 Other hyperlipidemia: Secondary | ICD-10-CM

## 2019-12-07 DIAGNOSIS — I4821 Permanent atrial fibrillation: Secondary | ICD-10-CM

## 2019-12-07 DIAGNOSIS — I10 Essential (primary) hypertension: Secondary | ICD-10-CM

## 2019-12-07 NOTE — Patient Instructions (Signed)

## 2019-12-07 NOTE — Progress Notes (Signed)
Cardiology Office Note   Date:  12/07/2019   ID:  Amber Wolfe, DOB 14-Nov-1931, MRN [id number]  PCP:  Remi Haggard, FNP  Cardiologist:   Kathlyn Sacramento, MD   Chief Complaint  Patient presents with  . other    6 month f/u no complaints today. Meds reviewed verbally with pt.      History of Present Illness: Amber Wolfe is a 86 y.o. female who presents for a follow-up visit regarding chronic atrial flutter/fibrillation.  Other medical problems include diabetes mellitus, hypertension, hyperlipidemia and moderate mitral regurgitation. She had a nuclear stress test done in March 2017 which showed no evidence of ischemia or perfusion defects with normal ejection fraction. She had mildly reduced ejection fraction the setting of atrial fibrillation and tachycardia that subsequently normalized.   She had prior bradycardia with beta-blockers and amiodarone but he is tolerating small dose metoprolol at the present time.   She was hospitalized in September 2019 after a fall that was complicated by large flank hematoma.  Xarelto was subsequently switched to small dose Eliquis.   Most recent echocardiogram in November 2020 showed an EF of 55 to 60% with trace mitral regurgitation.    She has been doing well with no recent chest pain, shortness of breath or palpitations.  No significant leg claudication.  Past Medical History:  Diagnosis Date  . Arthritis    In hands  . Atrial flutter ([place])    a. s/p successful TEE/DCCV 07/31/2015; b. 01/2016 Amio added for Afib; c. 11/2017 Amio d/c'd 2/2 bradycardia; d. 12/2018 Back in aflutter-->Zio: Avg HR 74 (41-176) 100% Afl->bb added back; e. CHADS2VASc => 6 (CHF, HTN, age x 2, DM, female)-->eliquis 2.5 bid.  . Bradycardia    a. 11/2017 noted during hospitalization @ [facility]-->amio d/c'd by cardiology.  . Cataract   . Chronic combined systolic (congestive) and diastolic (congestive) heart failure ([place])    a. 06/2015 Echo: EF 40%, basal HK, nl WM of mid  and apical segments, mild to mod MR/TR; b. TEE 05/30/4399: nl LV systolic function, mild to mod MR, trivial TR; c. 06/2017 Echo: EF 55-60%, mild LVH, Gr2 DD, triv AI, mild MR, mod dil LA, mildly dil RA, mild to mod TR. PASP 35-40mHg; d. 01/2019 Echo: Ef 55-60%, mod dil LA mildly dil RA. Mild TR. Trace MR.  . Diabetes mellitus without complication (H[facility])    Type II  . GERD (gastroesophageal reflux disease)   . Glaucoma    Right Eye  . Hematoma of abdominal wall    a. 11/2017 Fall-->R flank hematoma w/ anemia req PRBC. Xarelto dc'd at that time.  . Hyperlipidemia   . Hypertension   . Moderate mitral regurgitation    a. see echo from 06/2015 and TEE 07/2015; b. 06/2017 Echo: Mild MR.  .Marland KitchenPAF (paroxysmal atrial fibrillation) ([place]    a.  [facility]amio added (later dc'd 11/2017 2/2 bradycardia).  . Right renal mass    a. 11/2017 CT Abd ([facility]: 3.4cm R upper pole renal mass concerning for renal cell carcinoma-->outpt f/u w/ urology.    Past Surgical History:  Procedure Laterality Date  . CATARACT EXTRACTION Bilateral   . CESAREAN SECTION    . ELECTROPHYSIOLOGIC STUDY N/A 07/31/2015   Procedure: CARDIOVERSION;  Surgeon: AWende Bushy MD;  Location: [facility] ORS;  Service: Cardiovascular;  Laterality: N/A;  . ELECTROPHYSIOLOGIC STUDY N/A 04/03/2016   Procedure: CARDIOVERSION;  Surgeon: TMinna Merritts MD;  Location: [facility] ORS;  Service: Cardiovascular;  Laterality: N/A;  . TEE WITHOUT CARDIOVERSION N/A 07/31/2015   Procedure: TRANSESOPHAGEAL ECHOCARDIOGRAM (TEE);  Surgeon: Wende Bushy, MD;  Location: [facility] ORS;  Service: Cardiovascular;  Laterality: N/A;  . WISDOM TOOTH EXTRACTION    . Wrist Surgery Left      Current Outpatient Medications  Medication Sig Dispense Refill  . amLODipine (NORVASC) 10 MG tablet Take 1 tablet (10 mg total) by mouth daily. 30 tablet 4  . atorvastatin (LIPITOR) 20 MG tablet Take 1 tablet (20 mg total) by mouth at bedtime. 90 tablet 3  . Blood Pressure  KIT Check blood pressure daily or as needed if tired or fatigued; dx I10 1 each 0  . Cholecalciferol (VITAMIN D3) 1.25 MG (50000 UT) CAPS Take 1 capsule by mouth once a week.    Marland Kitchen ELIQUIS 2.5 MG TABS tablet TAKE ONE TABLET BY MOUTH TWICE DAILY 180 tablet 1  . furosemide (LASIX) 20 MG tablet Take 1 tablet (20 mg total) by mouth daily. 30 tablet 2  . insulin glargine (LANTUS) 100 UNIT/ML injection Inject 12 Units into the skin Nightly.    Marland Kitchen lisinopril (ZESTRIL) 40 MG tablet Take 1 tablet (40 mg total) by mouth daily. 90 tablet 0  . metoprolol tartrate (LOPRESSOR) 25 MG tablet Take 1 tablet (25 mg total) by mouth 2 (two) times daily. 180 tablet 3   No current facility-administered medications for this visit.    Allergies:   Patient has no known allergies.    Social History:  The patient  reports that she has never smoked. She has never used smokeless tobacco. She reports that she does not drink alcohol and does not use drugs.   Family History:  The patient's family history includes Aneurysm in her daughter; Cataracts in her mother; Heart attack in her brother, father, maternal grandfather, mother, and paternal grandfather.    ROS:  Please see the history of present illness.   Otherwise, review of systems are positive for none.   All other systems are reviewed and negative.    PHYSICAL EXAM: VS:  BP 120/60 (BP Location: Left Arm, Patient Position: Sitting, Cuff Size: Normal)   Pulse 73   Ht 5' 5"  (1.651 m)   Wt 137 lb 8 oz (62.4 kg)   SpO2 99%   BMI 22.88 kg/m  , BMI Body mass index is 22.88 kg/m. GEN: Well nourished, well developed, in no acute distress  HEENT: normal  Neck: no JVD, carotid bruits, or masses Cardiac: Irregularly irregular; no  rubs, or gallops,no edema . 2/6 crescendo decrescendo systolic murmur in the aortic area which is early peaking Respiratory:  clear to auscultation bilaterally, normal work of breathing GI: soft, nontender, nondistended, + BS MS: no deformity  or atrophy  Skin: warm and dry, no rash Neuro:  Strength and sensation are intact Psych: euthymic mood, full affect   EKG:  EKG is ordered today. The ekg ordered today demonstrates atrial fibrillation with ventricular rate of 73 bpm.  Recent Labs: 01/05/2019: BUN 13; Creatinine, Ser 0.60; Hemoglobin 12.2; Magnesium 2.1; Platelets 162; Potassium 4.0; Sodium 142; TSH 2.990    Lipid Panel    Component Value Date/Time   CHOL 132 06/21/2017 1414   CHOL 159 05/15/2015 1619   TRIG 97 06/21/2017 1414   TRIG 124 05/15/2015 1619   HDL 55 06/21/2017 1414   CHOLHDL 2.4 06/21/2017 1414   VLDL 15 06/15/2016 1459   VLDL 25 05/15/2015 1619   LDLCALC 59 06/21/2017 1414  Wt Readings from Last 3 Encounters:  12/07/19 137 lb 8 oz (62.4 kg)  05/25/19 125 lb 12 oz (57 kg)  02/21/19 120 lb 8 oz (54.7 kg)      No flowsheet data found.    ASSESSMENT AND PLAN:  1.  Chronic atrial fibrillation: Ventricular rate is well controlled on metoprolol 25 mg twice daily with no symptoms suggestive of bradycardia.  She is tolerating anticoagulation with small dose Eliquis with no recurrent falls.  She is going to have labs done with her primary care physician in the next few weeks and I asked her to request copies to be sent to [place].  2. Essential hypertension: Blood pressure is controlled on current medications  3. Hyperlipidemia: I reviewed lipid profile done in January which showed an LDL of 61.  She is currently on atorvastatin.  4.  History of cardiomyopathy and moderate mitral regurgitation: Most recent echocardiogram in November 2020 showed normal EF and only trace mitral regurgitation    Disposition:   FU with me in 6 months  Signed,  Kathlyn Sacramento, MD  12/07/2019 8:33 AM    [place]

## 2019-12-22 ENCOUNTER — Other Ambulatory Visit: Payer: Self-pay | Admitting: Cardiovascular Disease

## 2019-12-22 ENCOUNTER — Other Ambulatory Visit: Payer: Self-pay

## 2019-12-22 MED ORDER — AMLODIPINE BESYLATE 10 MG PO TABS: 10.0000 mg | ORAL_TABLET | Freq: Every day | ORAL | 3 refills | Status: DC

## 2019-12-22 MED ORDER — AMLODIPINE BESYLATE 10 MG PO TABS: 10.0000 mg | ORAL_TABLET | Freq: Every day | ORAL | 4 refills | Status: DC

## 2019-12-22 MED ORDER — METOPROLOL TARTRATE 25 MG PO TABS: 25.0000 mg | ORAL_TABLET | Freq: Two times a day (BID) | ORAL | 3 refills | Status: DC

## 2019-12-22 NOTE — Telephone Encounter (Signed)
°*  STAT* If patient is at the pharmacy, call can be transferred to refill team.   1. Which medications need to be refilled? (please list name of each medication and dose if known)  amlodipine 10mg  1 tablet daily metoprolol tartrate 1 tablet 2 times daily   2. Which pharmacy/location (including street and city if local pharmacy) is medication to be sent to? [place]   3. Do they need a 30 day or 90 day supply? 90 day

## 2020-01-25 ENCOUNTER — Other Ambulatory Visit: Payer: Self-pay

## 2020-01-25 MED ORDER — LISINOPRIL 40 MG PO TABS: 40.0000 mg | ORAL_TABLET | Freq: Every day | ORAL | 0 refills | Status: DC

## 2020-01-25 NOTE — Telephone Encounter (Signed)
This is a [place] pt, Dr. Arida °

## 2020-02-05 ENCOUNTER — Other Ambulatory Visit: Payer: Self-pay

## 2020-02-05 MED ORDER — ATORVASTATIN CALCIUM 20 MG PO TABS: 20.0000 mg | ORAL_TABLET | Freq: Every day | ORAL | 3 refills | Status: DC

## 2020-02-26 ENCOUNTER — Other Ambulatory Visit: Payer: Self-pay | Admitting: Cardiovascular Disease

## 2020-02-26 NOTE — Telephone Encounter (Signed)
Pt last saw Dr Fletcher Anon 12/07/19, last labs 01/18/19 Creat 0.61, pt is overdue for labwork. No recent labwork in [place] or care everywhere.  Please call pt and schedule her for CBC and BMP in [place] office.  Needs labs for rx refill. Pt received 90 day rx refill on 02/26/20, so pt has plenty of Eliquis at present. This refill is just to be placed on file at pharmacy.

## 2020-02-26 NOTE — Telephone Encounter (Signed)
Refill Request.  

## 2020-02-26 NOTE — Telephone Encounter (Signed)
Please see note below. 

## 2020-02-27 NOTE — Telephone Encounter (Signed)
50f 62.4kg Scr 0.6 (01/09/20) Lovw/arida (12/07/19) Pt requesting 2.5mg  eliquis but qualifies for 5mg  will route to pharmd pool for assesment

## 2020-02-27 NOTE — Telephone Encounter (Addendum)
Spoke with the patient. Pt sts that she has had recent lab work with her pcp in Oct 2021. Adv the pt that I will contact her pcps office to rqst copies.  Spoke with pts pcps office. The pt did have lab work drawn on 01/09/20 that included a cbc and cmet. They will fax a copy of the results to our office.   Lab results received and are viewable in the patients chart.

## 2020-02-28 NOTE — Telephone Encounter (Signed)
Weight increased to 137 lbs at visit in Sept, all prior weights over past 2 years were under 132 lbs (60kg). Will continue on Eliquis 2.5mg  for now and continue to trend weight. If weight remains > 60kg at future visits, can increase Eliquis dose at that time.

## 2020-06-28 NOTE — Progress Notes (Signed)
Cardiology Office Note    Date:  07/03/2020   ID:  Amber Wolfe, DOB [birth year], MRN [id number]  PCP:  Remi Haggard, FNP  Cardiologist:  Kathlyn Sacramento, MD  Electrophysiologist:  None   Chief Complaint: Follow-up  History of Present Illness:   Amber Wolfe is a 87 y.o. female with history of cardiomyopathy, atrial flutter/fib, DM2, HTN, HLD, and mitral regurgitation who presents for follow-up of A. Fib/flutter.  She was previously followed by Dr. Yvone Neu.  Nuclear stress test in 05/2015 showed no evidence of ischemia or perfusion defects with a normal EF.  She was diagnosed with atrial flutter in early 2017.  In this setting, she was noted to have a mildly reduced LV systolic function with an EF of 40% by echo in 06/2015.  She subsequently underwent DCCV in 07/2015.  She had recurrent A. fib in 01/2016 and was placed on amiodarone with repeat successful DCCV in 03/2016.  Follow-up echo in 07/2016 showed an improved LV systolic function with an EF of 60 to 65%, moderate LVH, grade 2 diastolic dysfunction, mild to moderate mitral regurgitation, mildly dilated left atrium, mildly to moderately dilated right atrium, and PASP 40 to 45 mmHg.  Her beta-blocker has previously been discontinued and amiodarone reduced to 100 mg daily in the setting of bradycardia.  Echo in 06/2017 showed a normal LV systolic function with mild mitral regurgitation and mild to moderate tricuspid regurgitation.  She was admitted to [facility] in 11/2017 in the setting of recurrent falls and right flank hematoma with significant anemia.  During her admission she required 1 unit of PRBC and Xarelto was placed on hold.  Amiodarone was discontinued in the setting of bradycardia.  Oral anticoagulation ultimately ended up being held until 08/2018, at which time she was doing well and had not had any recurrent falls leading her to be placed on Eliquis 2.5 mg twice daily with stable hemoglobin.  She was noted to be back in rate controlled atrial  flutter in 12/2018.  Out of concern for previous drop in EF, when in atrial flutter, echo was obtained in 01/2019 which showed a normal LV systolic function with an EF of 55 to 60% with trace mitral regurgitation.  Subsequent outpatient cardiac monitoring to assess heart rates given prior history of bradycardia showed an average heart rate of 73 bpm with a low of 41 bpm mostly occurring during the overnight hours.  Her highest heart rate was 176 bpm and she remained in A. fib/flutter throughout the monitoring period.  Given this, she was placed on metoprolol 25 mg twice daily.  She was last seen in the office in 11/2019 and was doing well from a cardiac perspective.  She remained in permanent A. fib with well-controlled ventricular response and was tolerating low-dose metoprolol and Eliquis without issues.  She comes in accompanied by her daughter today and is doing well from a cardiac perspective.  No chest pain, dyspnea, palpitations, dizziness, presyncope, syncope, lower extremity swelling, abdominal distention, orthopnea, PND, or early satiety.  No falls, melena, hematochezia, hemoptysis, hematemesis, or hematuria.  She is tolerating all medications without issues.  She does not have a BP cuff at home.  Her weight is up 6 pounds when compared to her last visit in our office and she attributes this to increased snacking at bedtime in an effort to prevent hypoglycemia.  She does feel like she gets fatigued more easily now.   Labs independently reviewed: 12/2019 - potassium 3.7, BUN 11,  serum creatinine 0.6, albumin 4.2, AST/ALT normal, TC 142, TG 90, HDL 59, LDL 65, A1c 7.6, TSH normal, Hgb 13, PLT 181  Past Medical History:  Diagnosis Date  . Arthritis    In hands  . Atrial flutter ([place])    a. s/p successful TEE/DCCV 07/31/2015; b. 01/2016 Amio added for Afib; c. 11/2017 Amio d/c'd 2/2 bradycardia; d. 12/2018 Back in aflutter-->Zio: Avg HR 74 (41-176) 100% Afl->bb added back; e. CHADS2VASc => 6 (CHF,  HTN, age x 2, DM, female)-->eliquis 2.5 bid.  . Bradycardia    a. 11/2017 noted during hospitalization @ [facility]-->amio d/c'd by cardiology.  . Cataract   . Chronic combined systolic (congestive) and diastolic (congestive) heart failure ([place])    a. 06/2015 Echo: EF 40%, basal HK, nl WM of mid and apical segments, mild to mod MR/TR; b. TEE 0/05/5595: nl LV systolic function, mild to mod MR, trivial TR; c. 06/2017 Echo: EF 55-60%, mild LVH, Gr2 DD, triv AI, mild MR, mod dil LA, mildly dil RA, mild to mod TR. PASP 35-28mHg; d. 01/2019 Echo: Ef 55-60%, mod dil LA mildly dil RA. Mild TR. Trace MR.  . Diabetes mellitus without complication (HCC)    Type II  . GERD (gastroesophageal reflux disease)   . Glaucoma    Right Eye  . Hematoma of abdominal wall    a. 11/2017 Fall-->R flank hematoma w/ anemia req PRBC. Xarelto dc'd at that time.  . Hyperlipidemia   . Hypertension   . Moderate mitral regurgitation    a. see echo from 06/2015 and TEE 07/2015; b. 06/2017 Echo: Mild MR.  .Marland KitchenPAF (paroxysmal [place]) ([place]    [facility].  [facility] (later dc'd 11/2017 2/2 bradycardia).  . Right renal mass    a. 11/2017 CT Abd ([facility]: 3.4cm R upper pole renal mass concerning for renal cell carcinoma-->outpt f/u w/ urology.    Past Surgical History:  Procedure Laterality Date  . CATARACT EXTRACTION Bilateral   . CESAREAN SECTION    . ELECTROPHYSIOLOGIC STUDY N/A 07/31/2015   Procedure: CARDIOVERSION;  Surgeon: AWende Bushy MD;  Location: [facility] ORS;  Service: Cardiovascular;  Laterality: N/A;  . ELECTROPHYSIOLOGIC STUDY N/A 04/03/2016   Procedure: CARDIOVERSION;  Surgeon: TMinna Merritts MD;  Location: [facility] ORS;  Service: Cardiovascular;  Laterality: N/A;  . TEE WITHOUT CARDIOVERSION N/A 07/31/2015   Procedure: TRANSESOPHAGEAL ECHOCARDIOGRAM (TEE);  Surgeon: AWende Bushy MD;  Location: [facility] ORS;  Service: Cardiovascular;  Laterality: N/A;  . WISDOM TOOTH EXTRACTION    . Wrist Surgery  Left     Current Medications: Current Meds  Medication Sig  . amLODipine (NORVASC) 10 MG tablet Take 1 tablet (10 mg total) by mouth daily.  .Marland Kitchenatorvastatin (LIPITOR) 20 MG tablet Take 1 tablet (20 mg total) by mouth at bedtime.  . Blood Pressure KIT Check blood pressure daily or as needed if tired or fatigued; dx I10  . Cholecalciferol (VITAMIN D3) 1.25 MG (50000 UT) CAPS Take 1 capsule by mouth once a week.  .Marland KitchenELIQUIS 2.5 MG TABS tablet TAKE ONE TABLET BY MOUTH TWICE DAILY  . furosemide (LASIX) 20 MG tablet Take 1 tablet (20 mg total) by mouth daily.  . insulin glargine (LANTUS) 100 UNIT/ML injection Inject 12 Units into the skin Nightly.  .Marland Kitchenlisinopril (ZESTRIL) 40 MG tablet Take 1 tablet (40 mg total) by mouth daily.  . [DISCONTINUED] metoprolol tartrate (LOPRESSOR) 25 MG tablet Take 1 tablet (25 mg total) by mouth 2 (  two) times daily.    Allergies:   Patient has no known allergies.   Social History   Socioeconomic History  . Marital status: Widowed    Spouse name: Not on file  . Number of children: 1  . Years of education: Not on file  . Highest education level: 12th grade  Occupational History  . Occupation: Retired  Tobacco Use  . Smoking status: Never Smoker  . Smokeless tobacco: Never Used  . Tobacco comment: smoking cessation materials not required  Vaping Use  . Vaping Use: Never used  Substance and Sexual Activity  . Alcohol use: No  . Drug use: No  . Sexual activity: Not Currently  Other Topics Concern  . Not on file  Social History Narrative  . Not on file   Social Determinants of Health   Financial Resource Strain: Not on file  Food Insecurity: Not on file  Transportation Needs: Not on file  Physical Activity: Not on file  Stress: Not on file  Social Connections: Not on file     Family History:  The patient's family history includes Aneurysm in her daughter; Cataracts in her mother; Heart attack in her brother, father, maternal grandfather, mother,  and paternal grandfather.  ROS:   Review of Systems  Constitutional: Positive for malaise/fatigue. Negative for chills, diaphoresis, fever and weight loss.  HENT: Negative for congestion.   Eyes: Negative for discharge and redness.  Respiratory: Negative for cough, hemoptysis, sputum production, shortness of breath and wheezing.   Cardiovascular: Negative for chest pain, palpitations, orthopnea, claudication, leg swelling and PND.  Gastrointestinal: Negative for abdominal pain, blood in stool, heartburn, melena, nausea and vomiting.  Genitourinary: Negative for hematuria.  Musculoskeletal: Negative for falls and myalgias.  Skin: Negative for rash.  Neurological: Positive for weakness. Negative for dizziness, tingling, tremors, sensory change, speech change, focal weakness and loss of consciousness.  Endo/Heme/Allergies: Does not bruise/bleed easily.  Psychiatric/Behavioral: Negative for substance abuse. The patient is not nervous/anxious.   All other systems reviewed and are negative.    EKGs/Labs/Other Studies Reviewed:    Studies reviewed were summarized above. The additional studies were reviewed today:  2D echo 01/2019: 1. Left ventricular ejection fraction, by visual estimation, is 55 to  60%. The left ventricle has normal function. There is no left ventricular  hypertrophy.  2. Left ventricular diastolic parameters are indeterminate.  3. Global right ventricle has normal systolic function.The right  ventricular size is normal. Right vetricular wall thickness was not  assessed.  4. Left atrial size was moderately dilated.  5. Right atrial size was mildly dilated.  6. The mitral valve is normal in structure. Trace mitral valve  regurgitation.  7. The tricuspid valve is normal in structure. Tricuspid valve  regurgitation is mild.  8. The aortic valve is tricuspid. Aortic valve regurgitation is not  visualized.  9. The pulmonic valve was not well visualized. Pulmonic  valve  regurgitation is not visualized.  10. Mildly elevated pulmonary artery systolic pressure.  11. The inferior vena cava is normal in size with greater than 50%  respiratory variability, suggesting right atrial pressure of 3 mmHg. __________  Elwyn Reach patch 12/2018: Analysis time 12 days 13 hours  Atrial Fibrillation/Flutter occurred continuously (100% burden), ranging from 41-176 bpm (avg of 73 bpm).  High rate at 6:35 AM, not patient triggered Low rate at 7:45 AM, not patient triggered  Isolated VEs were rare (<1.0%), VE Couplets were rare (<1.0%), and no VE Triplets were present.  Patient triggered  events were not associated with significant arrhythmia apart from the flutter __________  2D echo 06/2017: - Left ventricle: The cavity size was at the upper limits of  normal. Wall thickness was increased in a pattern of mild LVH.  Systolic function was normal. The estimated ejection fraction was  in the range of 55% to 60%. Regional wall motion abnormalities  cannot be excluded. Features are consistent with a pseudonormal  left ventricular filling pattern, with concomitant abnormal  relaxation and increased filling pressure (grade 2 diastolic  dysfunction).  - Aortic valve: Mildly calcified annulus. Trileaflet. There was  trivial regurgitation.  - Mitral valve: Mildly calcified annulus. There was mild  regurgitation.  - Left atrium: The atrium was moderately dilated.  - Right atrium: The atrium was mildly dilated.  - Tricuspid valve: There was mild-moderate regurgitation.  - Pulmonary arteries: Systolic pressure was mildly to moderately  increased, in the range of 35 mm Hg to 40 mm Hg plus central  venous/right atrial pressure.  - Pericardium, extracardiac: A trivial pericardial effusion was  identified posterior to the heart. __________  2D echo 07/2016: - Left ventricle: The cavity size was normal. Wall thickness was  increased in a pattern of  moderate LVH. Systolic function was  normal. The estimated ejection fraction was in the range of 60%  to 65%. Features are consistent with a pseudonormal left  ventricular filling pattern, with concomitant abnormal relaxation  and increased filling pressure (grade 2 diastolic dysfunction).  Acoustic contrast opacification revealed no evidence ofthrombus.  - Mitral valve: There was mild to moderate regurgitation.  - Left atrium: The atrium was mildly dilated.  - Right atrium: The atrium was mildly to moderately dilated.  - Pulmonary arteries: Systolic pressure was mildly to moderately  increased, in the range of 40 mm Hg to 45 mm Hg. __________  2D echo 01/2016: - Left ventricle: The cavity size was normal. There was mild  concentric hypertrophy. Systolic function was normal. The  estimated ejection fraction was in the range of 55% to 60%. Wall  motion was normal; there were no regional wall motion  abnormalities. The study is not technically sufficient to allow  evaluation of LV diastolic function.  - Mitral valve: There was mild regurgitation.  - Left atrium: The atrium was mildly dilated.  - Right ventricle: Systolic function was normal.  - Pulmonary arteries: Systolic pressure was mildly elevated. PA  peak pressure: 35 mm Hg (S).   Impressions:   - Rhythm is atrial fibrillation/flutter. __________  TEE 07/2015: - Left atrium: No evidence of thrombus in the atrial cavity or  appendage.  __________  24-hour Holter monitor 06/2015: Overall rhythm appears to be atrial flutter. Average heart rate of 78 BPM.  Minimum heart rate of 47 bpm at 4:53 AM 07/11/2015. 3% of total number of beats were in bradycardia. Maximum heart rate of 189 bpm at 10:19 AM of 07/10/2015. 11% of the total number of beats were in tachycardia. __________  2D echo 06/2015: - Left ventricle: The cavity size was normal. Wall thickness was  normal. The estimated ejection fraction  was approximately 40%.  There is hypokinesis in the basal segments, normal wall motion in  the mid and apical segments.  - Mitral valve: Calcified annulus. There was mild to moderate  regurgitation.  - Left atrium: The atrium was moderately dilated.  - Right atrium: The atrium was moderately dilated.  - Tricuspid valve: There was mild-moderate regurgitation.    EKG:  EKG is ordered today.  The EKG ordered today demonstrates A. fib with slow ventricular response, 56 bpm, LVH, nonspecific ST-T changes  Recent Labs: No results found for requested labs within last 8760 hours.  Recent Lipid Panel    Component Value Date/Time   CHOL 132 06/21/2017 1414   CHOL 159 05/15/2015 1619   TRIG 97 06/21/2017 1414   TRIG 124 05/15/2015 1619   HDL 55 06/21/2017 1414   CHOLHDL 2.4 06/21/2017 1414   VLDL 15 06/15/2016 1459   VLDL 25 05/15/2015 1619   LDLCALC 59 06/21/2017 1414    PHYSICAL EXAM:    VS:  BP 118/70 (BP Location: Left Arm, Patient Position: Sitting, Cuff Size: Normal)   Pulse (!) 56   Ht 5' 4"  (1.626 m)   Wt 143 lb (64.9 kg)   SpO2 98%   BMI 24.55 kg/m   BMI: Body mass index is 24.55 kg/m.  Physical Exam Vitals reviewed.  Constitutional:      Appearance: She is well-developed.  HENT:     Head: Normocephalic and atraumatic.  Eyes:     General:        Right eye: No discharge.        Left eye: No discharge.  Neck:     Vascular: No JVD.  Cardiovascular:     Rate and Rhythm: Bradycardia present. Rhythm irregularly irregular.     Pulses: No midsystolic click and no opening snap.          Posterior tibial pulses are 2+ on the right side and 2+ on the left side.     Heart sounds: S1 normal and S2 normal. Heart sounds not distant. Murmur heard.  High-pitched blowing holosystolic murmur is present with a grade of 2/6 at the apex. No friction rub.  Pulmonary:     Effort: Pulmonary effort is normal. No respiratory distress.     Breath sounds: Normal breath sounds. No  decreased breath sounds, wheezing or rales.  Chest:     Chest wall: No tenderness.  Abdominal:     General: There is no distension.     Palpations: Abdomen is soft.     Tenderness: There is no abdominal tenderness.  Musculoskeletal:     Cervical back: Normal range of motion.  Skin:    General: Skin is warm and dry.     Nails: There is no clubbing.  Neurological:     Mental Status: She is alert and oriented to person, place, and time.  Psychiatric:        Speech: Speech normal.        Behavior: Behavior normal.        Thought Content: Thought content normal.        Judgment: Judgment normal.     Wt Readings from Last 3 Encounters:  07/03/20 143 lb (64.9 kg)  12/07/19 137 lb 8 oz (62.4 kg)  05/25/19 125 lb 12 oz (57 kg)     ASSESSMENT & PLAN:   1. Permanent Afib: She remains in A. fib with a mildly bradycardic rate.  Given her history of beta-blocker induced bradycardia and prior falls we will decrease her Lopressor to 12.5 mg twice daily.  Given her CHA2DS2-VASc at least 6 (CHF, HTN, age x2, DM, sex category) she remains on Eliquis 2.5 mg twice daily.  Consider adjusting dose of Eliquis at next visit with primary cardiologist.  Given history of falls she remains on a small dose Eliquis at this time.  No symptoms concerning for bleeding or recurrent falls.  Check BMP and CBC.  2. History of cardiomyopathy and moderate mitral regurgitation: She appears euvolemic and well compensated.  Most recent echo in 01/2019 showed a normal EF with only trace mitral regurgitation.  Given she has previously developed a cardiomyopathy when in A. fib/flutter, and in the setting of her increased fatigue, we will update an echo.  For now she remains on metoprolol, lisinopril, and Lasix.  Check BMP.  3. HTN: Blood pressure is well controlled in the office today.  Continue current medications including amlodipine, Lasix, lisinopril, and Lopressor.  4. HLD: LDL 65 in 12/2019 with normal LFT at that time.   She remains on atorvastatin.  Disposition: F/u with Dr. Fletcher Anon or an APP in 6 months, sooner if needed based on updated echo.   Medication Adjustments/Labs and Tests Ordered: Current medicines are reviewed at length with the patient today.  Concerns regarding medicines are outlined above. Medication changes, Labs and Tests ordered today are summarized above and listed in the Patient Instructions accessible in Encounters.   Signed, Christell Faith, PA-C 07/03/2020 9:52 AM     [facility] [address] [place] [address], [place] [address] [phone]

## 2020-07-03 ENCOUNTER — Other Ambulatory Visit: Payer: Self-pay

## 2020-07-03 ENCOUNTER — Encounter: Payer: Self-pay | Admitting: Physician Assistant

## 2020-07-03 ENCOUNTER — Ambulatory Visit (INDEPENDENT_AMBULATORY_CARE_PROVIDER_SITE_OTHER): Payer: Medicare Other | Admitting: Physician Assistant

## 2020-07-03 VITALS — BP 118/70 | HR 56 | Ht 64.0 in | Wt 143.0 lb

## 2020-07-03 DIAGNOSIS — I340 Nonrheumatic mitral (valve) insufficiency: Secondary | ICD-10-CM | POA: Diagnosis not present

## 2020-07-03 DIAGNOSIS — I10 Essential (primary) hypertension: Secondary | ICD-10-CM | POA: Diagnosis not present

## 2020-07-03 DIAGNOSIS — I429 Cardiomyopathy, unspecified: Secondary | ICD-10-CM

## 2020-07-03 DIAGNOSIS — I4821 Permanent atrial fibrillation: Secondary | ICD-10-CM | POA: Diagnosis not present

## 2020-07-03 DIAGNOSIS — E782 Mixed hyperlipidemia: Secondary | ICD-10-CM

## 2020-07-03 MED ORDER — METOPROLOL TARTRATE 25 MG PO TABS
12.5000 mg | ORAL_TABLET | Freq: Two times a day (BID) | ORAL | 1 refills | Status: DC
Start: 2020-07-03 — End: 2021-02-24

## 2020-07-03 NOTE — Patient Instructions (Signed)
Medication Instructions:  Your physician recommends that you continue on your current medications as directed. Please refer to the Current Medication list given to you today. * *If you need a refill on your cardiac medications before your next appointment, please call your pharmacy*   Lab Work: Bmp and Cbc today If you have labs (blood work) drawn today and your tests are completely normal, you will receive your results only by: Marland Kitchen MyChart Message (if you have MyChart) OR . A paper copy in the mail If you have any lab test that is abnormal or we need to change your treatment, we will call you to review the results.   Testing/Procedures: Your physician has requested that you have an echocardiogram. Echocardiography is a painless test that uses sound waves to create images of your heart. It provides your doctor with information about the size and shape of your heart and how well your heart's chambers and valves are working. This procedure takes approximately one hour. There are no restrictions for this procedure.     Follow-Up: At [facility], you and your health needs are our priority.  As part of our continuing mission to provide you with exceptional heart care, we have created designated Provider Care Teams.  These Care Teams include your primary Cardiologist (physician) and Advanced Practice Providers (APPs -  Physician Assistants and Nurse Practitioners) who all work together to provide you with the care you need, when you need it.  We recommend signing up for the patient portal called "MyChart".  Sign up information is provided on this After Visit Summary.  MyChart is used to connect with patients for Virtual Visits (Telemedicine).  Patients are able to view lab/test results, encounter notes, upcoming appointments, etc.  Non-urgent messages can be sent to your provider as well.   To learn more about what you can do with MyChart, go to [email or web address].    Your next appointment:    6 month(s)  The format for your next appointment:   In Person  Provider:   You may see Kathlyn Sacramento, MD or one of the following Advanced Practice Providers on your designated Care Team:    Murray Hodgkins, NP  Christell Faith, PA-C  Marrianne Mood, PA-C  Cadence Benicia, [place]  Laurann [place], NP    Other Instructions N/A

## 2020-07-04 LAB — CBC WITH DIFFERENTIAL/PLATELET
Basophils Absolute: 0 10*3/uL (ref 0.0–0.2)
Basos: 1 %
EOS (ABSOLUTE): 0.1 10*3/uL (ref 0.0–0.4)
Eos: 2 %
Hematocrit: 37.8 % (ref 34.0–46.6)
Hemoglobin: 12.9 g/dL (ref 11.1–15.9)
Immature Grans (Abs): 0 10*3/uL (ref 0.0–0.1)
Immature Granulocytes: 0 %
Lymphocytes Absolute: 1.1 10*3/uL (ref 0.7–3.1)
Lymphs: 16 %
MCH: 32.1 pg (ref 26.6–33.0)
MCHC: 34.1 g/dL (ref 31.5–35.7)
MCV: 94 fL (ref 79–97)
Monocytes Absolute: 0.6 10*3/uL (ref 0.1–0.9)
Monocytes: 8 %
Neutrophils Absolute: 5.1 10*3/uL (ref 1.4–7.0)
Neutrophils: 73 %
Platelets: 178 10*3/uL (ref 150–450)
RBC: 4.02 x10E6/uL (ref 3.77–5.28)
RDW: 12.2 % (ref 11.7–15.4)
WBC: 6.9 10*3/uL (ref 3.4–10.8)

## 2020-07-04 LAB — BASIC METABOLIC PANEL
BUN/Creatinine Ratio: 16 (ref 12–28)
BUN: 12 mg/dL (ref 8–27)
CO2: 22 mmol/L (ref 20–29)
Calcium: 9.5 mg/dL (ref 8.7–10.3)
Chloride: 103 mmol/L (ref 96–106)
Creatinine, Ser: 0.75 mg/dL (ref 0.57–1.00)
Glucose: 156 mg/dL — ABNORMAL HIGH (ref 65–99)
Potassium: 4.3 mmol/L (ref 3.5–5.2)
Sodium: 140 mmol/L (ref 134–144)
eGFR: 77 mL/min/{1.73_m2} (ref >59)

## 2020-07-30 ENCOUNTER — Other Ambulatory Visit: Payer: Medicare Other

## 2020-08-20 ENCOUNTER — Ambulatory Visit: Payer: Medicare Other | Admitting: Dermatology

## 2020-08-20 ENCOUNTER — Other Ambulatory Visit: Payer: Self-pay

## 2020-08-20 ENCOUNTER — Encounter: Payer: Self-pay | Admitting: Dermatology

## 2020-08-20 DIAGNOSIS — L814 Other melanin hyperpigmentation: Secondary | ICD-10-CM

## 2020-08-20 DIAGNOSIS — L821 Other seborrheic keratosis: Secondary | ICD-10-CM

## 2020-08-20 DIAGNOSIS — Z85828 Personal history of other malignant neoplasm of skin: Secondary | ICD-10-CM

## 2020-08-20 DIAGNOSIS — C44629 Squamous cell carcinoma of skin of left upper limb, including shoulder: Secondary | ICD-10-CM | POA: Diagnosis not present

## 2020-08-20 DIAGNOSIS — D492 Neoplasm of unspecified behavior of bone, soft tissue, and skin: Secondary | ICD-10-CM

## 2020-08-20 HISTORY — DX: Personal history of other malignant neoplasm of skin: Z85.828

## 2020-08-20 MED ORDER — MUPIROCIN 2 % EX OINT
TOPICAL_OINTMENT | CUTANEOUS | 0 refills | Status: DC
Start: 2020-08-20 — End: 2021-03-21

## 2020-08-20 NOTE — Progress Notes (Signed)
   New Patient Visit  Subjective  Amber Wolfe is a 87 y.o. female who presents for the following: Skin Problem (New pt c/o growth on her right arm x 4-5 weeks growing ).  Sister in law with patient   The following portions of the chart were reviewed this encounter and updated as appropriate:   Tobacco  Allergies  Meds  Problems  Med Hx  Surg Hx  Fam Hx      Review of Systems:  No other skin or systemic complaints except as noted in HPI or Assessment and Plan.  Objective  Well appearing patient in no apparent distress; mood and affect are within normal limits.  A focused examination was performed including right forearm . Relevant physical exam findings are noted in the Assessment and Plan.  Objective  left forearm: 2.2 cm thick keratotic plaque          Assessment & Plan  Neoplasm of skin left forearm  Exam and history consistent with keratoacanthoma type SCC skin cancer. Discussed with patient. Discussed treatment options including ED&C vs excision. Given rapid growth, will do ED&C for treatment today and recheck site.  Epidermal / dermal shaving  Lesion diameter (cm):  2.2 Informed consent: discussed and consent obtained   Timeout: patient name, date of birth, surgical site, and procedure verified   Procedure prep:  Patient was prepped and draped in usual sterile fashion Prep type:  Isopropyl alcohol Anesthesia: the lesion was anesthetized in a standard fashion   Anesthetic:  1% lidocaine w/ epinephrine 1-100,000 buffered w/ 8.4% NaHCO3 Hemostasis achieved with: pressure, aluminum chloride and electrodesiccation   Outcome: patient tolerated procedure well   Post-procedure details: sterile dressing applied and wound care instructions given   Dressing type: bandage and petrolatum    Destruction of lesion  Destruction method: electrodesiccation and curettage   Informed consent: discussed and consent obtained   Timeout:  patient name, date of birth,  surgical site, and procedure verified Anesthesia: the lesion was anesthetized in a standard fashion   Anesthetic:  1% lidocaine w/ epinephrine 1-100,000 buffered w/ 8.4% NaHCO3 Curettage performed in three different directions: Yes   Electrodesiccation performed over the curetted area: Yes   Curettage cycles:  2 Hemostasis achieved with:  electrodesiccation Outcome: patient tolerated procedure well with no complications   Post-procedure details: sterile dressing applied and wound care instructions given   Dressing type: petrolatum    Specimen 1 - Surgical pathology Differential Diagnosis: R/O SCC KA type  Check Margins: No  No cervical or axillary lymphadenopathy  Ordered Medications: mupirocin ointment (BACTROBAN) 2 %    Seborrheic Keratoses - Stuck-on, waxy, tan-brown papules and/or plaques  - Benign-appearing - Discussed benign etiology and prognosis. - Observe - Call for any changes  Lentigines - Scattered tan macules - Due to sun exposure - Benign-appering, observe - Recommend daily broad spectrum sunscreen SPF 30+ to sun-exposed areas, reapply every 2 hours as needed. - Call for any changes  Return in about 2 months (around 10/20/2020) for 2-3 months TBSE, recheck ED&C site .  I, Marye Round, CMA, am acting as scribe for Forest Gleason, MD .  Documentation: I have reviewed the above documentation for accuracy and completeness, and I agree with the above.  Forest Gleason, MD

## 2020-08-20 NOTE — Patient Instructions (Addendum)

## 2020-08-22 ENCOUNTER — Telehealth: Payer: Self-pay

## 2020-08-22 NOTE — Telephone Encounter (Signed)
-----   Message from Alfonso Patten, MD sent at 08/22/2020  4:42 PM EDT ----- Skin , left forearm SQUAMOUS CELL CARCINOMA, KERATOACANTHOMA TYPE, BASE INVOLVED --> already treated with [facility] in clinic  Will recheck at follow up and do FBSE at that time.  MAs please call. Thank you!

## 2020-08-22 NOTE — Telephone Encounter (Signed)
Patient's daughter advised bx showed SCC, already treated at time of biopsy. Will recheck and do FBSE at f/u, JS

## 2020-08-23 ENCOUNTER — Other Ambulatory Visit: Payer: Self-pay | Admitting: Cardiovascular Disease

## 2020-08-23 NOTE — Telephone Encounter (Addendum)
Eliquis 2.5mg  refill request received. Patient is 87 years old, weight-64.9kg on 07/03/2020, Crea-0.75 on 07/03/20, Diagnosis-Afib, and last seen by Christell Faith, PA on 07/03/2020. Per that OV Note: Consider adjusting dose of Eliquis at next visit with primary cardiologist.  Given history of falls she remains on a small dose Eliquis at this time.  Dose is inappropriate based on dosing criteria. Received advice from [facility] that since pt has not been reevaluated by Primary Cardiologist (due 6 months from 4/6 OV visit) and the recent note from [place], [place] regarding falls and remains on small dose until-will keep dose at 2.5mg  and send in 3 month supply.

## 2020-08-23 NOTE — Telephone Encounter (Signed)
Please review for refill, Thanks !  

## 2020-08-27 ENCOUNTER — Other Ambulatory Visit: Payer: Medicare Other

## 2020-09-26 ENCOUNTER — Other Ambulatory Visit: Payer: Medicare Other

## 2020-10-02 ENCOUNTER — Ambulatory Visit (INDEPENDENT_AMBULATORY_CARE_PROVIDER_SITE_OTHER): Payer: Medicare Other

## 2020-10-02 ENCOUNTER — Other Ambulatory Visit: Payer: Self-pay

## 2020-10-02 DIAGNOSIS — I340 Nonrheumatic mitral (valve) insufficiency: Secondary | ICD-10-CM | POA: Diagnosis not present

## 2020-10-02 LAB — ECHOCARDIOGRAM COMPLETE
AR max vel: 1.66 cm2
AV Area VTI: 1.71 cm2
AV Area mean vel: 1.65 cm2
AV Mean grad: 3 mmHg
AV Peak grad: 6.3 mmHg
Ao pk vel: 1.25 m/s
S' Lateral: 3.1 cm
Single Plane A4C EF: 56.3 %

## 2020-10-03 ENCOUNTER — Telehealth: Payer: Self-pay | Admitting: *Deleted

## 2020-10-03 NOTE — Telephone Encounter (Signed)
Reviewed echo results with patient and she verbalized understanding with no further questions at this time.

## 2020-10-03 NOTE — Telephone Encounter (Signed)
-----   Message from Amber Mu, PA-C sent at 10/03/2020  7:28 AM EDT ----- Echo showed a normal pump function, normal wall motion, dilated left atrium, and trivially leaky mitral valve. Despite her atrial arrhythmia, her pump function remains stable and normal, which is great news. Her leaky mitral valve is also stable.

## 2020-11-06 ENCOUNTER — Ambulatory Visit: Payer: Medicare Other | Admitting: Urology

## 2020-11-27 ENCOUNTER — Telehealth: Payer: Self-pay

## 2020-11-27 ENCOUNTER — Encounter: Payer: Self-pay | Admitting: Dermatology

## 2020-11-27 ENCOUNTER — Ambulatory Visit: Payer: Medicare Other | Admitting: Dermatology

## 2020-11-27 ENCOUNTER — Other Ambulatory Visit: Payer: Self-pay

## 2020-11-27 DIAGNOSIS — D229 Melanocytic nevi, unspecified: Secondary | ICD-10-CM

## 2020-11-27 DIAGNOSIS — Z1283 Encounter for screening for malignant neoplasm of skin: Secondary | ICD-10-CM

## 2020-11-27 DIAGNOSIS — L853 Xerosis cutis: Secondary | ICD-10-CM

## 2020-11-27 DIAGNOSIS — D489 Neoplasm of uncertain behavior, unspecified: Secondary | ICD-10-CM

## 2020-11-27 DIAGNOSIS — L578 Other skin changes due to chronic exposure to nonionizing radiation: Secondary | ICD-10-CM | POA: Diagnosis not present

## 2020-11-27 DIAGNOSIS — Z85828 Personal history of other malignant neoplasm of skin: Secondary | ICD-10-CM | POA: Diagnosis not present

## 2020-11-27 DIAGNOSIS — L821 Other seborrheic keratosis: Secondary | ICD-10-CM

## 2020-11-27 DIAGNOSIS — D1800 Hemangioma unspecified site: Secondary | ICD-10-CM

## 2020-11-27 DIAGNOSIS — L570 Actinic keratosis: Secondary | ICD-10-CM | POA: Diagnosis not present

## 2020-11-27 DIAGNOSIS — L905 Scar conditions and fibrosis of skin: Secondary | ICD-10-CM

## 2020-11-27 DIAGNOSIS — L814 Other melanin hyperpigmentation: Secondary | ICD-10-CM

## 2020-11-27 NOTE — Telephone Encounter (Signed)
LM for Malachy Mood Lindley's nurse to return my call regarding subq nodule of right breast. Can they schedule dx testing or can our office and what type of testing is recommended.

## 2020-11-27 NOTE — Patient Instructions (Addendum)
Melanoma ABCDEs  Melanoma is the most dangerous type of skin cancer, and is the leading cause of death from skin disease.  You are more likely to develop melanoma if you: Have light-colored skin, light-colored eyes, or red or blond hair Spend a lot of time in the sun Tan regularly, either outdoors or in a tanning bed Have had blistering sunburns, especially during childhood Have a close family member who has had a melanoma Have atypical moles or large birthmarks  Early detection of melanoma is key since treatment is typically straightforward and cure rates are extremely high if we catch it early.   The first sign of melanoma is often a change in a mole or a new dark spot.  The ABCDE system is a way of remembering the signs of melanoma.  A for asymmetry:  The two halves do not match. B for border:  The edges of the growth are irregular. C for color:  A mixture of colors are present instead of an even brown color. D for diameter:  Melanomas are usually (but not always) greater than 73m - the size of a pencil eraser. E for evolution:  The spot keeps changing in size, shape, and color.  Please check your skin once per month between visits. You can use a small mirror in front and a large mirror behind you to keep an eye on the back side or your body.   If you see any new or changing lesions before your next follow-up, please call to schedule a visit.  Please continue daily skin protection including broad spectrum sunscreen SPF 30+ to sun-exposed areas, reapplying every 2 hours as needed when you're outdoors.   Staying in the shade or wearing long sleeves, sun glasses (UVA+UVB protection) and wide brim hats (4-inch brim around the entire circumference of the hat) are also recommended for sun protection.    Cryotherapy  Cryotherapy is the treatment of lesions with the application of a cold substance.  In most cases, liquid nitrogen is used to destroy the lesion(s).  Liquid nitrogen is so cold,  -196 Celsius, it feels like it is burning when it is applied.  After treatment with liquid nitrogen, there may be some burning sensation or pain that can last up to 24 hours.  The area may also be swollen and red.  The discomfort can be relieved with ibuprofen (Advil, Motrin), acetaminophen (Extra Strength Tylenol), or similar pain relief medication.  Within 24 to 48 hours, a blister may form.  Occasionally, these blisters will become filled with blood and become very dark.  This is no cause for concern.  The blister will gradually dry up over a period of several days, eventually separating from the healing skin below in about one to two weeks.  The surrounding skin will become red and the area may become itchy.  This is all part of the normal healing process.  Occasionally, the crusts will last as long as four weeks when certain deeper spots on the skin are treated.  Sometimes a permanent white mark or scar will be left after healing.  You may continue all of your normal activities as long as they do not cause pain in the treated areas.  It is okay to get the area wet.  After therapy or if the blister is still intact - You may treat it like normal skin.  Covering the area with a bandage may offer some comfort and protection from trauma but is not absolutely necessary.  If  the blister is uncomfortable - Clean a small needle with rubbing alcohol, then gently make a small hole in the side of the blister to drain the blister fluid.  This often gives immediate relief.  Do not remove the blister roof, as the blister aids in healing.  In time it will fall off on its own.   Once the blister roof falls off or a sore forms -  Clean the blister sites with soap and water.  Rinse the area and pat dry.  Do not force off the blister roof or crust. Apply an antibiotic ointment such as Polysporin or Bacitracin. A bandage may be applied loosely over the blister until it is healed if desired. Call our office if you  are concerned it may be infected.  Some redness, itching and oozing is part of the normal healing process.  Signs of infection include increasing redness, increasing pain, swelling, heat, or yellow discharge. If you have any questions or concerns for your doctor, please call our main line at [phone] and press option 4 to reach your doctor's medical assistant. If no one answers, please leave a voicemail as directed and we will return your call as soon as possible. Messages left after 4 pm will be answered the following business day.   You may also send [place] a message via [place]. We typically respond to MyChart messages within 1-2 business days.  For prescription refills, please ask your pharmacy to contact our office. Our fax number is [phone].  If you have an urgent issue when the clinic is closed that cannot wait until the next business day, you can page your doctor at the number below.    Please note that while we do our best to be available for urgent issues outside of office hours, we are not available 24/7.   If you have an urgent issue and are unable to reach [place], you may choose to seek medical care at your doctor's office, retail clinic, urgent care center, or emergency room.  If you have a medical emergency, please immediately call 911 or go to the emergency department.  Pager Numbers  - Dr. Nehemiah Massed: [phone]  - Dr. Laurence Ferrari: [phone]  - Dr. Nicole Kindred: [phone]  In the event of inclement weather, please call our main line at [phone] for an update on the status of any delays or closures.  Dermatology Medication Tips: Please keep the boxes that topical medications come in in order to help keep track of the instructions about where and how to use these. Pharmacies typically print the medication instructions only on the boxes and not directly on the medication tubes.   If your medication is too expensive, please contact our office at [phone] option 4 or send  [place] a message through [place].   We are unable to tell what your co-pay for medications will be in advance as this is different depending on your insurance coverage. However, we may be able to find a substitute medication at lower cost or fill out paperwork to get insurance to cover a needed medication.   If a prior authorization is required to get your medication covered by your insurance company, please allow [place] 1-2 business days to complete this process.  Drug prices often vary depending on where the prescription is filled and some pharmacies may offer cheaper prices.  The website www.goodrx.com contains coupons for medications through different pharmacies. The prices here do not account for what the cost may be with help from insurance (it may  be cheaper with your insurance), but the website can give you the price if you did not use any insurance.  - You can print the associated coupon and take it with your prescription to the pharmacy.  - You may also stop by our office during regular business hours and pick up a GoodRx coupon card.  - If you need your prescription sent electronically to a different pharmacy, notify our office through [facility] or by phone at [phone] option 4.  Gentle Skin Care Guide  1. Bathe no more than once a day.  2. Avoid bathing in hot water  3. Use a mild soap like Dove, Vanicream, Cetaphil, CeraVe. Can use Lever 2000 or Cetaphil antibacterial soap  4. Use soap only where you need it. On most days, use it under your arms, between your legs, and on your feet. Let the water rinse other areas unless visibly dirty.  5. When you get out of the bath/shower, use a towel to gently blot your skin dry, don't rub it.  6. While your skin is still a little damp, apply a moisturizing cream such as Vanicream, CeraVe, Cetaphil, Eucerin, Sarna lotion or plain Vaseline Jelly. For hands apply Neutrogena [place] Hand Cream or Excipial Hand Cream.  7. Reapply  moisturizer any time you start to itch or feel dry.  8. Sometimes using free and clear laundry detergents can be helpful. Fabric softener sheets should be avoided. Downy Free & Gentle liquid, or any liquid fabric softener that is free of dyes and perfumes, it acceptable to use  9. If your doctor has given you prescription creams you may apply moisturizers over them

## 2020-11-27 NOTE — Progress Notes (Signed)
Follow-Up Visit   Subjective  Amber Wolfe is a 87 y.o. female who presents for the following: Annual Exam (Skin cancer screening. HxSCC at left dorsal forearm. Bx/EDC 08/20/2020.).  Patient here for full body skin exam and skin cancer screening.   The following portions of the chart were reviewed this encounter and updated as appropriate:  Tobacco  Allergies  Meds  Problems  Med Hx  Surg Hx  Fam Hx      Review of Systems: No other skin or systemic complaints except as noted in HPI or Assessment and Plan.   Objective  Well appearing patient in no apparent distress; mood and affect are within normal limits.  A full examination was performed including scalp, head, eyes, ears, nose, lips, neck, chest, axillae, abdomen, back, buttocks, bilateral upper extremities, bilateral lower extremities, hands, feet, fingers, toes, fingernails, and toenails. All findings within normal limits unless otherwise noted below.  Left Superior Knee x1, left eyebrow x1, left cheek x1 (hypertrophic) (3) Erythematous thin papules/macules with gritty scale.   Right Breast 6.5 x 4.5 cm subcutaneous nodule with inverted nipple  Left Eyebrow Hyperpigmented atrophic lesion without features suspicious for malignancy on dermoscopy   Assessment & Plan   Lentigines - Scattered tan macules - Due to sun exposure - Benign-appering, observe - Recommend daily broad spectrum sunscreen SPF 30+ to sun-exposed areas, reapply every 2 hours as needed. - Call for any changes  Seborrheic Keratoses - Stuck-on, waxy, tan-brown papules and/or plaques  - Benign-appearing - Discussed benign etiology and prognosis. - Observe - Call for any changes  Melanocytic Nevi - Tan-brown and/or pink-flesh-colored symmetric macules and papules - Benign appearing on exam today - Observation - Call clinic for new or changing moles - Recommend daily use of broad spectrum spf 30+ sunscreen to sun-exposed areas.    Hemangiomas - Red papules - Discussed benign nature - Observe - Call for any changes  Actinic Damage - Chronic condition, secondary to cumulative UV/sun exposure - diffuse scaly erythematous macules with underlying dyspigmentation - Recommend daily broad spectrum sunscreen SPF 30+ to sun-exposed areas, reapply every 2 hours as needed.  - Staying in the shade or wearing long sleeves, sun glasses (UVA+UVB protection) and wide brim hats (4-inch brim around the entire circumference of the hat) are also recommended for sun protection.  - Call for new or changing lesions.  Skin cancer screening performed today.  History of Squamous Cell Carcinoma of the Skin - No evidence of recurrence today at left dorsal forearm. - No lymphadenopathy - Recommend regular full body skin exams - Recommend daily broad spectrum sunscreen SPF 30+ to sun-exposed areas, reapply every 2 hours as needed.  - Call if any new or changing lesions are noted between office visits   AK (actinic keratosis) (3) Left Superior Knee x1, left eyebrow x1, left cheek x1 (hypertrophic)  Actinic keratoses are precancerous spots that appear secondary to cumulative UV radiation exposure/sun exposure over time. They are chronic with expected duration over 1 year. A portion of actinic keratoses will progress to squamous cell carcinoma of the skin. It is not possible to reliably predict which spots will progress to skin cancer and so treatment is recommended to prevent development of skin cancer.  Recommend daily broad spectrum sunscreen SPF 30+ to sun-exposed areas, reapply every 2 hours as needed.  Recommend staying in the shade or wearing long sleeves, sun glasses (UVA+UVB protection) and wide brim hats (4-inch brim around the entire circumference of the hat). Call  for new or changing lesions.  Recheck hypertrophic AK at left cheek   Destruction of lesion - Left Superior Knee x1, left eyebrow x1, left cheek x1  (hypertrophic)  Destruction method: cryotherapy   Informed consent: discussed and consent obtained   Lesion destroyed using liquid nitrogen: Yes   Outcome: patient tolerated procedure well with no complications   Post-procedure details: wound care instructions given    Neoplasm of uncertain behavior Right Breast  6.5 x 4 cm subcutaneous nodule without any cutaneous involvement with inverted nipple with some bleeding/irritation Patient reports inverted nipple for 1 year Concerning for breast cancer and needs further evaluation  Will call PCP and inquire whether they want to take over evaluation from here or if they want [place] to order a diagnostic mammogram to get her evaluation started as quickly as possible  Related Procedures [facility] MAMMOGRAPHY  Scar Left Eyebrow  History of trauma at site. Patient denies changes.   Benign-appearing.  Observation.  Call clinic for new or changing lesions.  Recommend daily use of broad spectrum spf 30+ sunscreen to sun-exposed areas.    Xerosis - diffuse xerotic patches - recommend gentle, hydrating skin care - gentle skin care handout given  No cervical, axillary, or inguinal lymphadenopathy.  Return AK recheck 6-8 weeks, TBSE in 6 months.  I, Emelia Salisbury, CMA, am acting as scribe for Forest Gleason, MD.  Documentation: I have reviewed the above documentation for accuracy and completeness, and I agree with the above.  Forest Gleason, MD

## 2020-12-12 ENCOUNTER — Telehealth: Payer: Self-pay

## 2020-12-12 NOTE — Telephone Encounter (Signed)
Left msg for PCP nurse to return call concerning patient and recommendation for mammogram../js

## 2020-12-12 NOTE — Telephone Encounter (Signed)
I did not hear back from PCP, so ordered a diagnostic mammogram for patient the day after her visit. However, I have not seen results yet. Could you please check if the mammogram center has scheduled her? Thank you!

## 2020-12-13 ENCOUNTER — Other Ambulatory Visit: Payer: Self-pay | Admitting: Cardiovascular Disease

## 2020-12-16 MED ORDER — APIXABAN 2.5 MG PO TABS: 2.5000 mg | ORAL_TABLET | Freq: Two times a day (BID) | ORAL | 1 refills | Status: DC

## 2020-12-16 NOTE — Telephone Encounter (Signed)
Ok to keep on 2.'5mg'$  BID dose for now, addressed at last cards visit with Amber Wolfe -  "Given her CHA2DS2-VASc at least 6 (CHF, HTN, age x2, DM, sex category) she remains on Eliquis 2.5 mg twice daily.  Consider adjusting dose of Eliquis at next visit with primary cardiologist.  Given history of falls she remains on a small dose Eliquis at this time."

## 2020-12-16 NOTE — Telephone Encounter (Signed)
Refill request

## 2020-12-16 NOTE — Telephone Encounter (Signed)
ACCIDENTALLY FILLED AT 2.5MG  BID.  I CALLED THE PHARMACY AND LMOMTO CANCEL THE 2.5 BID, AS I BELIEVE THAT SHE QUALIFIES FOR A DOSE INCREASE AND WILL ROUTE TO THE PHARMD POOL TO SEE IF THEY AGREE.  Prescription refill request for Eliquis received. Indication:AFIB Last office visit:DUNN, RYAN 07/03/20 Scr: 0.75 07/03/20 Age: 38F Weight:64.9KG

## 2020-12-17 NOTE — Telephone Encounter (Signed)
From [facility] 12/16/20- The patient yall had on Thursday that might possibly have breast cancer, Threasa Alpha called Friday to talk to Dr. Laurence Ferrari. She was at home that day and left her home number but no cell.  I called patient today and spoke with her daughter Lynelle Smoke and she said she has not heard from [place] yet. She also said that the first available date she would be able to take her mother for a mammogramwould be 02/17/21. Patient has never had a mammogram.

## 2020-12-17 NOTE — Telephone Encounter (Signed)
Do we have a way to get the number Threasa Alpha left on the voicemail? I do not see it recorded anywhere to call her back. Thank you!  Also, would recommend patient call [facility] to schedule. I would not recommend she wait another 2 months to get the mammogram. I strongly recommend this be done as soon as possible and would recommend looking into other methods of transportation if her daughter is not available for the next two months. Thank you!

## 2020-12-18 ENCOUNTER — Other Ambulatory Visit: Payer: Self-pay | Admitting: Dermatology

## 2020-12-18 DIAGNOSIS — N6310 Unspecified lump in the right breast, unspecified quadrant: Secondary | ICD-10-CM

## 2020-12-18 NOTE — Telephone Encounter (Signed)
Message left for Malachy Mood Lindley's nurse asking to have her return call to Dr. Laurence Ferrari on her cell #.

## 2020-12-19 NOTE — Telephone Encounter (Signed)
Dr. Laurence Ferrari spoke with Amber Wolfe and they will take over care once mammogram results received./js

## 2020-12-19 NOTE — Telephone Encounter (Signed)
I also spoke with daughter (healthcare POA and guardian). She advises her mother gets confused sometimes and does not always remember what is discussed. Discussed that I am concerned about cancer and do not recommend delaying her mammogram. Ms. Grupp has advised she does not want treatment if this is breast cancer given her age, but I encourage her and the family to get the information first and if it is cancer, to have a conversation with the oncology team regarding treatment options as there are many different treatments for breast cancer now and she may decide she is interested in some options even if she does not want to do some of the others.  All questions were answered. As discussed today, I will call daughter Lynelle Smoke with results after the mammogram (rather than making her come in) to prevent delays in care. Ms. Pietrzak will follow-up with Threasa Alpha, NP (her PCP) for coordination of care once we get the results back.

## 2021-01-02 ENCOUNTER — Ambulatory Visit
Admission: RE | Admit: 2021-01-02 | Discharge: 2021-01-02 | Disposition: A | Discharge status: Home / Self Care | Payer: Medicare Other | Source: Ambulatory Visit | Attending: Dermatology | Admitting: Dermatology

## 2021-01-02 ENCOUNTER — Other Ambulatory Visit: Payer: Self-pay

## 2021-01-02 DIAGNOSIS — N6310 Unspecified lump in the right breast, unspecified quadrant: Secondary | ICD-10-CM

## 2021-01-02 DIAGNOSIS — N6311 Unspecified lump in the right breast, upper outer quadrant: Secondary | ICD-10-CM | POA: Diagnosis not present

## 2021-01-03 ENCOUNTER — Other Ambulatory Visit: Payer: Self-pay | Admitting: Family Medicine

## 2021-01-03 ENCOUNTER — Other Ambulatory Visit: Payer: Self-pay | Admitting: Dermatology

## 2021-01-07 ENCOUNTER — Other Ambulatory Visit: Payer: Self-pay | Admitting: Family Medicine

## 2021-01-07 DIAGNOSIS — R928 Other abnormal and inconclusive findings on diagnostic imaging of breast: Secondary | ICD-10-CM

## 2021-01-07 DIAGNOSIS — N6310 Unspecified lump in the right breast, unspecified quadrant: Secondary | ICD-10-CM

## 2021-01-14 ENCOUNTER — Other Ambulatory Visit: Payer: Self-pay

## 2021-01-14 ENCOUNTER — Ambulatory Visit
Admission: RE | Admit: 2021-01-14 | Discharge: 2021-01-14 | Disposition: A | Discharge status: Home / Self Care | Payer: Medicare Other | Source: Ambulatory Visit | Attending: Family Medicine | Admitting: Family Medicine

## 2021-01-14 DIAGNOSIS — R928 Other abnormal and inconclusive findings on diagnostic imaging of breast: Secondary | ICD-10-CM | POA: Insufficient documentation

## 2021-01-14 DIAGNOSIS — N6310 Unspecified lump in the right breast, unspecified quadrant: Secondary | ICD-10-CM | POA: Diagnosis present

## 2021-01-14 HISTORY — PX: BREAST BIOPSY: SHX20

## 2021-01-15 DIAGNOSIS — C50919 Malignant neoplasm of unspecified site of unspecified female breast: Secondary | ICD-10-CM

## 2021-01-16 ENCOUNTER — Telehealth: Payer: Self-pay

## 2021-01-16 NOTE — Telephone Encounter (Signed)
Patient's breast biopsy results from 01/14/21 faxed to PCP Threasa Alpha.Cherly Hensen

## 2021-01-16 NOTE — Telephone Encounter (Signed)
-----   Message from Alfonso Patten, MD sent at 01/15/2021  5:55 PM EDT ----- Reviewed results with patient's daughter. Patient already scheduled with Oncologist to discuss options. All questions answered. Given treatment for breast cancer (patient declines surgery, chemotherapy), will cancel hypertrophic AK f/u and they will let us know if there are any skin lesions growing. Otherwise will check in at 6 month f/u   MAs please fax copy of results to PCP Threasa Alpha FNP. Thank you!

## 2021-01-21 LAB — SURGICAL PATHOLOGY

## 2021-01-22 ENCOUNTER — Ambulatory Visit: Payer: Medicare Other | Admitting: Dermatology

## 2021-01-23 DIAGNOSIS — C50411 Malignant neoplasm of upper-outer quadrant of right female breast: Secondary | ICD-10-CM | POA: Insufficient documentation

## 2021-01-23 NOTE — Progress Notes (Signed)
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  INTERVAL HISTORY: Patient is an 87 year old female who noticed an enlarging mass in her right breast.  Subsequent mammogram, ultrasound, biopsy revealed the above-stated malignancy.  She has already indicated that she will decline surgery, chemotherapy, or radiation therapy.  She currently feels well and is asymptomatic.  She denies any pain.  She has no neurologic complaints.  She denies any recent fevers or illnesses.  She has good appetite and denies weight loss.  She has no chest pain, shortness of breath, cough, or hemoptysis.  She denies any nausea, vomiting, constipation, or diarrhea.  She has no urinary complaints.  Patient feels at her baseline offers no specific complaints today.  REVIEW OF SYSTEMS:   Review of Systems  Constitutional: Negative.  Negative for fever, malaise/fatigue and weight loss.  Respiratory: Negative.  Negative for cough, hemoptysis and shortness of breath.   Cardiovascular: Negative.  Negative for chest pain and leg swelling.  Gastrointestinal: Negative.  Negative for abdominal pain.  Genitourinary: Negative.  Negative for dysuria.  Musculoskeletal: Negative.  Negative for back pain.  Skin: Negative.  Negative for rash.  Neurological: Negative.  Negative for dizziness, focal weakness, weakness and headaches.  Psychiatric/Behavioral: Negative.  The patient is not nervous/anxious.    As per HPI. Otherwise, a complete review of systems is  negative.  PAST MEDICAL HISTORY: Past Medical History:  Diagnosis Date   Arthritis    In hands   Atrial flutter ([place])    a. s/p successful TEE/DCCV 07/31/2015; b. 01/2016 Amio added for Afib; c. 11/2017 Amio d/c'd 2/2 bradycardia; d. 12/2018 Back in aflutter-->Zio: Avg HR 74 (41-176) 100% Afl->bb added back; e. CHADS2VASc => 6 (CHF, HTN, age x 2, DM, female)-->eliquis 2.5 bid.   Bradycardia    a. 11/2017 noted during hospitalization @ [facility]-->amio d/c'd by cardiology.   Cataract    Chronic combined systolic (congestive) and diastolic (congestive) heart failure ([place])    a. 06/2015 Echo: EF 40%, basal HK, nl WM of mid and apical segments, mild to mod MR/TR; b. TEE 05/07/4130: nl LV systolic function, mild to mod MR, trivial TR; c. 06/2017 Echo: EF 55-60%, mild LVH, Gr2 DD, triv AI, mild MR, mod dil LA, mildly dil RA, mild to mod TR. PASP 35-16mHg; d. 01/2019 Echo: Ef 55-60%, mod dil LA mildly dil RA. Mild TR. Trace MR.   Diabetes mellitus without complication (HCC)    Type II   GERD (gastroesophageal reflux disease)    Glaucoma    Right Eye   Hematoma of abdominal wall    a. 11/2017 Fall-->R flank hematoma w/ anemia req PRBC. Xarelto dc'd at that time.   History of SCC (squamous cell carcinoma) of skin 08/20/2020   left forearm EDC   Hyperlipidemia    Hypertension    Moderate mitral regurgitation    a. see echo from 06/2015 and TEE 07/2015; b. 06/2017 Echo: Mild MR.   [place]    [facility]  [facility] (later dc'd 11/2017 2/2 bradycardia).  Right renal mass    a. 11/2017 CT [facility]): 3.4cm R upper pole renal mass concerning for renal cell carcinoma-->outpt f/u w/ urology.    PAST SURGICAL HISTORY: Past Surgical History:  Procedure Laterality Date   BREAST BIOPSY Right 01/14/2021   [place] bx 10:00 coil path pending   CATARACT EXTRACTION Bilateral    CESAREAN SECTION     ELECTROPHYSIOLOGIC STUDY N/A 07/31/2015   Procedure:  CARDIOVERSION;  Surgeon: Wende Bushy, MD;  Location: [facility] ORS;  Service: Cardiovascular;  Laterality: N/A;   ELECTROPHYSIOLOGIC STUDY N/A 04/03/2016   Procedure: CARDIOVERSION;  Surgeon: Minna Merritts, MD;  Location: [facility] ORS;  Service: Cardiovascular;  Laterality: N/A;   TEE WITHOUT CARDIOVERSION N/A 07/31/2015   Procedure: TRANSESOPHAGEAL ECHOCARDIOGRAM (TEE);  Surgeon: Wende Bushy, MD;  Location: [facility] ORS;  Service: Cardiovascular;  Laterality: N/A;   WISDOM TOOTH EXTRACTION     Wrist Surgery Left     FAMILY HISTORY: Family History  Problem Relation Age of Onset   Cataracts Mother    Heart attack Mother    Heart attack Father    Heart attack Brother    Aneurysm Daughter    Heart attack Maternal Grandfather    Heart attack Paternal Grandfather     ADVANCED DIRECTIVES (Y/N):  N  HEALTH MAINTENANCE: Social History   Tobacco Use   Smoking status: Never   Smokeless tobacco: Never   Tobacco comments:    smoking cessation materials not required  Vaping Use   Vaping Use: Never used  Substance Use Topics   Alcohol use: No   Drug use: No     Colonoscopy:  PAP:  Bone density:  Lipid panel:  No Known Allergies  Current Outpatient Medications  Medication Sig Dispense Refill   amLODipine (NORVASC) 10 MG tablet Take 1 tablet (10 mg total) by mouth daily. 90 tablet 3   apixaban (ELIQUIS) 2.5 MG TABS tablet Take 1 tablet (2.5 mg total) by mouth 2 (two) times daily. 180 tablet 1   atorvastatin (LIPITOR) 20 MG tablet Take 1 tablet (20 mg total) by mouth at bedtime. 90 tablet 3   Blood Pressure KIT Check blood pressure daily or as needed if tired or fatigued; dx I10 1 each 0   Cholecalciferol (VITAMIN D3) 1.25 MG (50000 UT) CAPS Take 1 capsule by mouth once a week.     furosemide (LASIX) 20 MG tablet Take 1 tablet (20 mg total) by mouth daily. 30 tablet 2   insulin glargine (LANTUS) 100 UNIT/ML injection Inject 12 Units into the skin Nightly.     letrozole (FEMARA) 2.5 MG  tablet Take 1 tablet (2.5 mg total) by mouth daily. 90 tablet 3   lisinopril (ZESTRIL) 40 MG tablet Take 1 tablet (40 mg total) by mouth daily. 90 tablet 0   metoprolol tartrate (LOPRESSOR) 25 MG tablet Take 0.5 tablets (12.5 mg total) by mouth 2 (two) times daily. 45 tablet 1   mupirocin ointment (BACTROBAN) 2 % Apply to skin qd-bid until healed (Patient not taking: Reported on 01/29/2021) 22 g 0   No current facility-administered medications for this visit.    OBJECTIVE: Vitals:   01/29/21 1049  BP: 132/76  Pulse: 80  Resp: 16  Temp: (!) 96.2 F (35.7 C)  SpO2: 98%     Body mass index is 24.7 kg/m.    ECOG FS:0 - Asymptomatic  General: Thin, no acute distress. Eyes: Pink conjunctiva, anicteric sclera. HEENT: Normocephalic, moist mucous membranes. Lungs: No audible wheezing or coughing. Heart:  Regular rate and rhythm. Abdomen: Soft, nontender, no obvious distention. Musculoskeletal: No edema, cyanosis, or clubbing. Neuro: Alert, answering all questions appropriately. Cranial nerves grossly intact. Skin: No rashes or petechiae noted. Psych: Normal affect. Lymphatics: No cervical, calvicular, axillary or inguinal LAD.   LAB RESULTS:  Lab Results  Component Value Date   NA 140 07/03/2020   K 4.3 07/03/2020   CL 103 07/03/2020   CO2 22 07/03/2020   GLUCOSE 156 (H) 07/03/2020   BUN 12 07/03/2020   CREATININE 0.75 07/03/2020   CALCIUM 9.5 07/03/2020   PROT 6.1 (L) 12/06/2017   ALBUMIN 3.4 (L) 12/06/2017   AST 33 12/06/2017   ALT 22 12/06/2017   ALKPHOS 57 12/06/2017   BILITOT 1.4 (H) 12/06/2017   GFRNONAA 83 01/05/2019   GFRAA 96 01/05/2019    Lab Results  Component Value Date   WBC 6.9 07/03/2020   NEUTROABS 5.1 07/03/2020   HGB 12.9 07/03/2020   HCT 37.8 07/03/2020   MCV 94 07/03/2020   PLT 178 07/03/2020     STUDIES: US BREAST LTD UNI RIGHT INC AXILLA  Result Date: 01/02/2021 CLINICAL DATA:  87 year old female with a palpable mass in the right breast  which she has been feeling for at least 1 year, with subsequent right nipple inversion 7-8 months ago and bloody discharge from the central right breast/right nipple for the past 3 months. EXAM: DIGITAL DIAGNOSTIC BILATERAL MAMMOGRAM WITH TOMOSYNTHESIS AND CAD; ULTRASOUND RIGHT BREAST LIMITED TECHNIQUE: Bilateral digital diagnostic mammography and breast tomosynthesis was performed. The images were evaluated with computer-aided detection.; Targeted ultrasound examination of the right breast was performed COMPARISON:  None. ACR Breast Density Category b: There are scattered areas of fibroglandular density. FINDINGS: There is an irregular mass with associated calcifications in the retroareolar to upper right breast with all together the mass and calcifications measuring 6.8 x 3.5 cm. There is associated right nipple inversion. No suspicious masses or calcifications seen in the left breast. Physical examination reveals a large firm mass involving the central to upper outer right breast. The right nipple is markedly inverted with crusting identified. Targeted ultrasound of the right breast was performed. There is an irregular hypoechoic mass in the right breast at 10 periareolar measuring at least 5 x 1.9 x 1.8 cm. No definite lymphadenopathy seen in the right axilla. IMPRESSION: Suspicious palpable right breast mass, with mass and calcifications all together measuring up to 6.8 cm. RECOMMENDATION: Recommend ultrasound-guided biopsy of the mass in the right breast at the 10 o'clock position. The patient states she does not desire any treatment (including chemotherapy, radiation, surgery) for the mass, however after further discussion she is amenable to biopsy to obtain a pathologic diagnosis as she may be receptive to medical therapy rather than surgery. I have discussed the findings and recommendations with the patient. If applicable, a reminder letter will be sent to the patient regarding the next appointment. BI-RADS  CATEGORY  5: Highly suggestive of malignancy. Electronically Signed   By: Everlean Alstrom M.D.   On: 01/02/2021 15:37  MM DIAG BREAST TOMO BILATERAL  Result Date: 01/02/2021 CLINICAL DATA:  87 year old female with a palpable mass in the right breast which she has been feeling for at least 1 year, with subsequent right nipple inversion 7-8 months ago and bloody discharge from the central right breast/right nipple for the past 3 months. EXAM: DIGITAL DIAGNOSTIC BILATERAL MAMMOGRAM WITH TOMOSYNTHESIS AND CAD; ULTRASOUND RIGHT BREAST LIMITED TECHNIQUE: Bilateral digital diagnostic mammography and breast tomosynthesis was performed. The  images were evaluated with computer-aided detection.; Targeted ultrasound examination of the right breast was performed COMPARISON:  None. ACR Breast Density Category b: There are scattered areas of fibroglandular density. FINDINGS: There is an irregular mass with associated calcifications in the retroareolar to upper right breast with all together the mass and calcifications measuring 6.8 x 3.5 cm. There is associated right nipple inversion. No suspicious masses or calcifications seen in the left breast. Physical examination reveals a large firm mass involving the central to upper outer right breast. The right nipple is markedly inverted with crusting identified. Targeted ultrasound of the right breast was performed. There is an irregular hypoechoic mass in the right breast at 10 periareolar measuring at least 5 x 1.9 x 1.8 cm. No definite lymphadenopathy seen in the right axilla. IMPRESSION: Suspicious palpable right breast mass, with mass and calcifications all together measuring up to 6.8 cm. RECOMMENDATION: Recommend ultrasound-guided biopsy of the mass in the right breast at the 10 o'clock position. The patient states she does not desire any treatment (including chemotherapy, radiation, surgery) for the mass, however after further discussion she is amenable to biopsy to obtain  a pathologic diagnosis as she may be receptive to medical therapy rather than surgery. I have discussed the findings and recommendations with the patient. If applicable, a reminder letter will be sent to the patient regarding the next appointment. BI-RADS CATEGORY  5: Highly suggestive of malignancy. Electronically Signed   By: Everlean Alstrom M.D.   On: 01/02/2021 15:37  MM CLIP PLACEMENT RIGHT  Result Date: 01/14/2021 CLINICAL DATA:  Evaluate COIL biopsy clip placement following ultrasound-guided RIGHT breast biopsy. EXAM: 3D DIAGNOSTIC RIGHT MAMMOGRAM POST ULTRASOUND BIOPSY COMPARISON:  Previous exam(s). FINDINGS: 3D Mammographic images were obtained following ultrasound guided biopsy of the 5 cm mass at the 10 o'clock position of the RIGHT breast. The COIL biopsy marking clip is in expected position at the site of biopsy along the SUPERIOR aspect of the mass. IMPRESSION: Appropriate positioning of the COIL shaped biopsy marking clip at the site of biopsy in the UPPER OUTER RIGHT breast, along the SUPERIOR aspect of the mass. Final Assessment: Post Procedure Mammograms for Marker Placement Electronically Signed   By: Margarette Canada M.D.   On: 01/14/2021 13:49  [place] RT BREAST BX W LOC DEV 1ST LESION IMG BX SPEC US GUIDE  Addendum Date: 01/15/2021   ADDENDUM REPORT: 01/15/2021 15:29 ADDENDUM: PATHOLOGY revealed: A. BREAST, RIGHT, 10 O'CLOCK; ULTRASOUND-GUIDED CORE BIOPSY: - INVASIVE MAMMARY CARCINOMA WITH LOBULAR FEATURES. Size of invasive carcinoma: 13 mm in this sample. Grade 1. Ductal carcinoma in situ: Not identified. Lobular carcinoma in situ: Present. Lymphovascular invasion: Not identified. Pathology results are CONCORDANT with imaging findings, per Dr. Hassan Rowan. Pathology results and recommendations were discussed with patient's daughter and POA Earlene Plater) via telephone on 01/15/2021. Patient's daughter reported biopsy site doing well with no adverse symptoms. Post biopsy care instructions were  reviewed, questions were answered and my direct phone number was provided. Patient's daughter was instructed to call [facility] for any additional questions or concerns related to biopsy site. RECOMMENDATIONS: Surgical consultation. Request for surgical consultation relayed to Al Pimple RN at [facility] by Electa Sniff RN on 01/15/2021. Pathology results reported by Electa Sniff RN on 01/15/2021. Electronically Signed   By: Margarette Canada M.D.   On: 01/15/2021 15:29   Result Date: 01/15/2021 CLINICAL DATA:  87 year old female for tissue sampling of 5 cm UPPER-OUTER RIGHT breast mass. EXAM: ULTRASOUND GUIDED  RIGHT BREAST CORE NEEDLE BIOPSY COMPARISON:  Previous exam(s). PROCEDURE: I met with the patient and we discussed the procedure of ultrasound-guided biopsy, including benefits and alternatives. We discussed the high likelihood of a successful procedure. We discussed the risks of the procedure, including infection, bleeding, tissue injury, clip migration, and inadequate sampling. Informed written consent was given. The usual time-out protocol was performed immediately prior to the procedure. Lesion quadrant: UPPER-OUTER RIGHT breast Using sterile technique and 1% Lidocaine with and without epinephrine as local anesthetic, under direct ultrasound visualization, a 14 gauge spring-loaded device was used to perform biopsy of the 5 cm mass at the 10 o'clock position of the RIGHT breast using a LATERAL approach. At the conclusion of the procedure a COIL tissue marker clip was deployed into the biopsy cavity. Follow up 2 view mammogram was performed and dictated separately. IMPRESSION: Ultrasound guided biopsy of 5 cm UPPER-OUTER RIGHT breast mass. No apparent complications. Electronically Signed: By: Margarette Canada M.D. 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  Will get a baseline bone mineral density in the next 1 to 2 weeks.  If patient has osteoporosis, will also add Fosamax.  Return to clinic in 3 months for routine evaluation.  Plan to repeat mammogram in 6 months.  I spent a total of 60 minutes reviewing chart data, face-to-face evaluation with the patient, counseling and coordination of care as detailed above.   Patient expressed understanding and was in agreement with this plan. She also understands that She can call clinic at any time with any questions, concerns, or complaints.   Cancer Staging 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, right [facility]) Staging form: Breast, AJCC 8th Edition - Clinical stage from 01/23/2021: Stage IIA (cT3, cN0, cM0, G1, ER+, PR+, HER2-) - Signed by Lloyd Huger, MD on 01/23/2021 Stage prefix: Initial diagnosis Histologic grading system: 3 grade system  Lloyd Huger, MD   01/30/2021 10:31 AM

## 2021-01-29 ENCOUNTER — Other Ambulatory Visit: Payer: Self-pay

## 2021-01-29 ENCOUNTER — Inpatient Hospital Stay: Payer: Medicare Other | Attending: Oncology | Admitting: Oncology

## 2021-01-29 ENCOUNTER — Inpatient Hospital Stay: Payer: Medicare Other

## 2021-01-29 ENCOUNTER — Encounter: Payer: Self-pay | Admitting: Oncology

## 2021-01-29 DIAGNOSIS — I4892 Unspecified atrial flutter: Secondary | ICD-10-CM | POA: Insufficient documentation

## 2021-01-29 DIAGNOSIS — Z79811 Long term (current) use of aromatase inhibitors: Secondary | ICD-10-CM | POA: Insufficient documentation

## 2021-01-29 DIAGNOSIS — Z7901 Long term (current) use of anticoagulants: Secondary | ICD-10-CM | POA: Diagnosis not present

## 2021-01-29 DIAGNOSIS — C50411 Malignant neoplasm of upper-outer quadrant of right female breast: Secondary | ICD-10-CM | POA: Diagnosis present

## 2021-01-29 DIAGNOSIS — Z794 Long term (current) use of insulin: Secondary | ICD-10-CM | POA: Insufficient documentation

## 2021-01-29 DIAGNOSIS — E1136 Type 2 diabetes mellitus with diabetic cataract: Secondary | ICD-10-CM | POA: Insufficient documentation

## 2021-01-29 DIAGNOSIS — K219 Gastro-esophageal reflux disease without esophagitis: Secondary | ICD-10-CM | POA: Insufficient documentation

## 2021-01-29 DIAGNOSIS — E785 Hyperlipidemia, unspecified: Secondary | ICD-10-CM | POA: Insufficient documentation

## 2021-01-29 DIAGNOSIS — H409 Unspecified glaucoma: Secondary | ICD-10-CM | POA: Diagnosis not present

## 2021-01-29 DIAGNOSIS — Z85828 Personal history of other malignant neoplasm of skin: Secondary | ICD-10-CM | POA: Diagnosis not present

## 2021-01-29 DIAGNOSIS — I5042 Chronic combined systolic (congestive) and diastolic (congestive) heart failure: Secondary | ICD-10-CM | POA: Insufficient documentation

## 2021-01-29 DIAGNOSIS — I110 Hypertensive heart disease with heart failure: Secondary | ICD-10-CM | POA: Diagnosis not present

## 2021-01-29 DIAGNOSIS — I340 Nonrheumatic mitral (valve) insufficiency: Secondary | ICD-10-CM | POA: Diagnosis not present

## 2021-01-29 DIAGNOSIS — Z170 Estrogen receptor positive status [ER+]: Secondary | ICD-10-CM | POA: Diagnosis not present

## 2021-01-29 DIAGNOSIS — Z79899 Other long term (current) drug therapy: Secondary | ICD-10-CM | POA: Diagnosis not present

## 2021-01-29 DIAGNOSIS — I480 Paroxysmal atrial fibrillation: Secondary | ICD-10-CM | POA: Diagnosis not present

## 2021-01-29 NOTE — Progress Notes (Signed)
Pt and family have no concerns at this time and "will wait to voice questions until after talking with dr."

## 2021-01-30 MED ORDER — LETROZOLE 2.5 MG PO TABS
2.5000 mg | ORAL_TABLET | Freq: Every day | ORAL | 3 refills | Status: DC
Start: 2021-01-30 — End: 2022-01-15

## 2021-02-06 ENCOUNTER — Other Ambulatory Visit: Payer: Self-pay | Admitting: *Deleted

## 2021-02-07 MED ORDER — ATORVASTATIN CALCIUM 20 MG PO TABS: 20.0000 mg | ORAL_TABLET | Freq: Every day | ORAL | 0 refills | Status: DC

## 2021-02-18 ENCOUNTER — Other Ambulatory Visit: Payer: Medicare Other

## 2021-02-22 ENCOUNTER — Other Ambulatory Visit: Payer: Self-pay | Admitting: Physician Assistant

## 2021-03-10 ENCOUNTER — Ambulatory Visit
Admission: RE | Admit: 2021-03-10 | Discharge: 2021-03-10 | Disposition: A | Discharge status: Home / Self Care | Payer: Medicare Other | Source: Ambulatory Visit | Attending: Oncology | Admitting: Oncology

## 2021-03-10 ENCOUNTER — Other Ambulatory Visit: Payer: Self-pay | Admitting: Oncology

## 2021-03-10 ENCOUNTER — Other Ambulatory Visit: Payer: Self-pay

## 2021-03-10 DIAGNOSIS — Z13820 Encounter for screening for osteoporosis: Secondary | ICD-10-CM | POA: Insufficient documentation

## 2021-03-10 DIAGNOSIS — C50411 Malignant neoplasm of upper-outer quadrant of right female breast: Secondary | ICD-10-CM

## 2021-03-10 DIAGNOSIS — Z780 Asymptomatic menopausal state: Secondary | ICD-10-CM | POA: Insufficient documentation

## 2021-03-10 DIAGNOSIS — M810 Age-related osteoporosis without current pathological fracture: Secondary | ICD-10-CM | POA: Insufficient documentation

## 2021-03-10 DIAGNOSIS — E119 Type 2 diabetes mellitus without complications: Secondary | ICD-10-CM | POA: Diagnosis not present

## 2021-03-10 DIAGNOSIS — Z853 Personal history of malignant neoplasm of breast: Secondary | ICD-10-CM | POA: Insufficient documentation

## 2021-03-10 MED ORDER — ALENDRONATE SODIUM 70 MG PO TABS: 70.0000 mg | ORAL_TABLET | ORAL | 3 refills | Status: DC

## 2021-03-11 ENCOUNTER — Ambulatory Visit: Payer: Medicare Other | Admitting: Physician Assistant

## 2021-03-16 NOTE — Progress Notes (Signed)
Naivigation initiated. Patient not interestedin aggressive treatment Saw Dr. Grayland Ormond for recommendations.

## 2021-03-18 NOTE — Telephone Encounter (Signed)
Scheduled

## 2021-03-21 ENCOUNTER — Other Ambulatory Visit: Payer: Self-pay

## 2021-03-21 ENCOUNTER — Encounter: Payer: Self-pay | Admitting: Cardiovascular Disease

## 2021-03-21 ENCOUNTER — Ambulatory Visit: Payer: Medicare Other | Admitting: Cardiovascular Disease

## 2021-03-21 VITALS — BP 110/60 | HR 64 | Ht 65.0 in | Wt 145.2 lb

## 2021-03-21 DIAGNOSIS — E782 Mixed hyperlipidemia: Secondary | ICD-10-CM

## 2021-03-21 DIAGNOSIS — I429 Cardiomyopathy, unspecified: Secondary | ICD-10-CM | POA: Diagnosis not present

## 2021-03-21 DIAGNOSIS — I4821 Permanent atrial fibrillation: Secondary | ICD-10-CM

## 2021-03-21 DIAGNOSIS — I10 Essential (primary) hypertension: Secondary | ICD-10-CM | POA: Diagnosis not present

## 2021-03-21 DIAGNOSIS — I340 Nonrheumatic mitral (valve) insufficiency: Secondary | ICD-10-CM

## 2021-03-21 MED ORDER — APIXABAN 5 MG PO TABS: 5.0000 mg | ORAL_TABLET | Freq: Two times a day (BID) | ORAL | 2 refills | Status: DC

## 2021-03-21 NOTE — Progress Notes (Signed)
Cardiology Office Note   Date:  03/21/2021   ID:  Amber Wolfe, DOB 01-20-1932, MRN [id number]  PCP:  Remi Haggard, FNP  Cardiologist:   Kathlyn Sacramento, MD   Chief Complaint  Patient presents with   Other    6 Month f/u no complaints today. Meds reviewed verbally with pt.      History of Present Illness: Amber Wolfe is a 87 y.o. female who presents for a follow-up visit regarding chronic atrial flutter/fibrillation.  Other medical problems include diabetes mellitus, hypertension, hyperlipidemia and moderate mitral regurgitation. She had a nuclear stress test done in March 2017 which showed no evidence of ischemia or perfusion defects with normal ejection fraction. She had mildly reduced ejection fraction the setting of atrial fibrillation and tachycardia that subsequently normalized.   She had prior bradycardia with beta-blockers and amiodarone but he is tolerating small dose metoprolol at the present time.   She was hospitalized in September 2019 after a fall that was complicated by large flank hematoma.  Xarelto was subsequently switched to small dose Eliquis.   Most recent echocardiogram in July 2022 showed normal LV systolic function with trivial mitral regurgitation.  She was diagnosed with right breast cancer recently and declined surgery, chemotherapy or radiation.  She is being treated with hormone therapy.  She is doing well from a cardiac standpoint with no chest pain, shortness of breath or palpitations.  Past Medical History:  Diagnosis Date   Arthritis    In hands   Atrial flutter ([place])    a. s/p successful TEE/DCCV 07/31/2015; b. 01/2016 Amio added for Afib; c. 11/2017 Amio d/c'd 2/2 bradycardia; d. 12/2018 Back in aflutter-->Zio: Avg HR 74 (41-176) 100% Afl->bb added back; e. CHADS2VASc => 6 (CHF, HTN, age x 2, DM, female)-->eliquis 2.5 bid.   Bradycardia    a. 11/2017 noted during hospitalization @ [facility]-->amio d/c'd by cardiology.   Cataract    Chronic  combined systolic (congestive) and diastolic (congestive) heart failure ([place])    a. 06/2015 Echo: EF 40%, basal HK, nl WM of mid and apical segments, mild to mod MR/TR; b. TEE 10/05/3901: nl LV systolic function, mild to mod MR, trivial TR; c. 06/2017 Echo: EF 55-60%, mild LVH, Gr2 DD, triv AI, mild MR, mod dil LA, mildly dil RA, mild to mod TR. PASP 35-70mHg; d. 01/2019 Echo: Ef 55-60%, mod dil LA mildly dil RA. Mild TR. Trace MR.   Diabetes mellitus without complication (HCC)    Type II   GERD (gastroesophageal reflux disease)    Glaucoma    Right Eye   Hematoma of abdominal wall    a. 11/2017 Fall-->R flank hematoma w/ anemia req PRBC. Xarelto dc'd at that time.   History of SCC (squamous cell carcinoma) of skin 08/20/2020   left forearm EDC   Hyperlipidemia    Hypertension    Moderate mitral regurgitation    a. see echo from 06/2015 and TEE 07/2015; b. 06/2017 Echo: Mild MR.   [place]    [facility]  [facility] (later dc'd 11/2017 2/2 bradycardia).   Right renal mass    a. 11/2017 CT Abd ([facility]: 3.4cm R upper pole renal mass concerning for renal cell carcinoma-->outpt f/u w/ urology.    Past Surgical History:  Procedure Laterality Date   BREAST BIOPSY Right 01/14/2021   u[place]oreabx 10:00 coil path pending   CATARACT EXTRACTION Bilateral    CESAREAN SECTION  ELECTROPHYSIOLOGIC STUDY N/A 07/31/2015   Procedure: CARDIOVERSION;  Surgeon: Wende Bushy, MD;  Location: [facility] ORS;  Service: Cardiovascular;  Laterality: N/A;   ELECTROPHYSIOLOGIC STUDY N/A 04/03/2016   Procedure: CARDIOVERSION;  Surgeon: Minna Merritts, MD;  Location: [facility] ORS;  Service: Cardiovascular;  Laterality: N/A;   TEE WITHOUT CARDIOVERSION N/A 07/31/2015   Procedure: TRANSESOPHAGEAL ECHOCARDIOGRAM (TEE);  Surgeon: Wende Bushy, MD;  Location: [facility] ORS;  Service: Cardiovascular;  Laterality: N/A;   WISDOM TOOTH EXTRACTION     Wrist Surgery Left      Current  Outpatient Medications  Medication Sig Dispense Refill   alendronate (FOSAMAX) 70 MG tablet Take 1 tablet (70 mg total) by mouth once a week. Take with a full glass of water on an empty stomach. 12 tablet 3   amLODipine (NORVASC) 10 MG tablet Take 1 tablet (10 mg total) by mouth daily. 90 tablet 3   apixaban (ELIQUIS) 2.5 MG TABS tablet Take 1 tablet (2.5 mg total) by mouth 2 (two) times daily. 180 tablet 1   atorvastatin (LIPITOR) 20 MG tablet Take 1 tablet (20 mg total) by mouth at bedtime. 90 tablet 0   Blood Pressure KIT Check blood pressure daily or as needed if tired or fatigued; dx I10 1 each 0   Cholecalciferol (VITAMIN D3) 1.25 MG (50000 UT) CAPS Take 1 capsule by mouth once a week.     furosemide (LASIX) 20 MG tablet Take 1 tablet (20 mg total) by mouth daily. 30 tablet 2   insulin glargine (LANTUS) 100 UNIT/ML injection Inject 12 Units into the skin Nightly.     letrozole (FEMARA) 2.5 MG tablet Take 1 tablet (2.5 mg total) by mouth daily. 90 tablet 3   lisinopril (ZESTRIL) 40 MG tablet Take 1 tablet (40 mg total) by mouth daily. 90 tablet 0   metoprolol tartrate (LOPRESSOR) 25 MG tablet TAKE 1/2 TABLET BY MOUTH TWICE DAILY 45 tablet 0   Vitamin D, Ergocalciferol, (DRISDOL) 1.25 MG (50000 UNIT) CAPS capsule Take 50,000 Units by mouth once a week.     No current facility-administered medications for this visit.    Allergies:   Patient has no known allergies.    Social History:  The patient  reports that she has never smoked. She has never used smokeless tobacco. She reports that she does not drink alcohol and does not use drugs.   Family History:  The patient's family history includes Aneurysm in her daughter; Cataracts in her mother; Heart attack in her brother, father, maternal grandfather, mother, and paternal grandfather.    ROS:  Please see the history of present illness.   Otherwise, review of systems are positive for none.   All other systems are reviewed and negative.     PHYSICAL EXAM: VS:  BP 110/60 (BP Location: Left Arm, Patient Position: Sitting, Cuff Size: Normal)    Pulse 64    Ht _0  (1.651 m)    Wt 145 lb 4 oz (65.9 kg)    SpO2 98%    BMI 24.17 kg/m  , BMI Body mass index is 24.17 kg/m. GEN: Well nourished, well developed, in no acute distress  HEENT: normal  Neck: no JVD, carotid bruits, or masses Cardiac: Irregularly irregular; no  rubs, or gallops,no edema . 2/6 crescendo decrescendo systolic murmur in the aortic area which is early peaking Respiratory:  clear to auscultation bilaterally, normal work of breathing GI: soft, nontender, nondistended, + BS MS: no deformity or atrophy  Skin: warm and dry, no  rash Neuro:  Strength and sensation are intact Psych: euthymic mood, full affect   EKG:  EKG is ordered today. The ekg ordered today demonstrates atrial fibrillation with left axis deviation, LVH.  Ventricular rate is 64 bpm.  Recent Labs: 07/03/2020: BUN 12; Creatinine, Ser 0.75; Hemoglobin 12.9; Platelets 178; Potassium 4.3; Sodium 140    Lipid Panel    Component Value Date/Time   CHOL 132 06/21/2017 1414   CHOL 159 05/15/2015 1619   TRIG 97 06/21/2017 1414   TRIG 124 05/15/2015 1619   HDL 55 06/21/2017 1414   CHOLHDL 2.4 06/21/2017 1414   VLDL 15 06/15/2016 1459   VLDL 25 05/15/2015 1619   LDLCALC 59 06/21/2017 1414      Wt Readings from Last 3 Encounters:  03/21/21 145 lb 4 oz (65.9 kg)  01/29/21 143 lb 14.4 oz (65.3 kg)  07/03/20 143 lb (64.9 kg)      No flowsheet data found.    ASSESSMENT AND PLAN:  1.  Chronic atrial fibrillation: Ventricular rate is well controlled on metoprolol 12.5 mg twice daily with no symptoms suggestive of bradycardia.   The patient has been on low-dose Eliquis previous bleeding complications after a fall in 2019.  He has not had any recurrent falls or bleeding issues and recent labs showed normal renal function.  Her weight is above 60 kg and thus I elected to increase Eliquis to 5 mg  twice daily which is the correct dose.   2. Essential hypertension: Blood pressure is controlled on current medications  3. Hyperlipidemia: She continues to take atorvastatin 20 mg daily.  No recent lipid profile.  4.  History of cardiomyopathy and moderate mitral regurgitation: Most recent echocardiogram showed normal ejection fraction and trivial mitral regurgitation.  5.  Breast cancer: Being treated with hormone therapy.    Disposition:   FU with me in 6 months  Signed,  Kathlyn Sacramento, MD  03/21/2021 9:41 AM    [place]

## 2021-03-21 NOTE — Patient Instructions (Signed)
Medication Instructions:  - Your physician has recommended you make the following change in your medication:   1) INCREASE Eliquis to 5 mg: - take 1 tablet by mouth TWICE daily   *If you need a refill on your cardiac medications before your next appointment, please call your pharmacy*   Lab Work: - none ordered  If you have labs (blood work) drawn today and your tests are completely normal, you will receive your results only by: [place] (if you have MyChart) OR A paper copy in the mail If you have any lab test that is abnormal or we need to change your treatment, we will call you to review the results.   Testing/Procedures: - none ordered   Follow-Up: At [facility], you and your health needs are our priority.  As part of our continuing mission to provide you with exceptional heart care, we have created designated Provider Care Teams.  These Care Teams include your primary Cardiologist (physician) and Advanced Practice Providers (APPs -  Physician Assistants and Nurse Practitioners) who all work together to provide you with the care you need, when you need it.  We recommend signing up for the patient portal called "MyChart".  Sign up information is provided on this After Visit Summary.  MyChart is used to connect with patients for Virtual Visits (Telemedicine).  Patients are able to view lab/test results, encounter notes, upcoming appointments, etc.  Non-urgent messages can be sent to your provider as well.   To learn more about what you can do with MyChart, go to [email or web address].    Your next appointment:   6 month(s)  The format for your next appointment:   In Person  Provider:   You may see Kathlyn Sacramento, MD or one of the following Advanced Practice Providers on your designated Care Team:   Murray Hodgkins, NP Christell Faith, PA-C Cadence Kathlen Mody, [place]    Other Instructions N/a

## 2021-04-30 ENCOUNTER — Other Ambulatory Visit: Payer: Self-pay | Admitting: Physician Assistant

## 2021-04-30 NOTE — Telephone Encounter (Signed)
Patient calling to check status of refill .  Advised to call pharmacy tomorrow morning to check if ready

## 2021-05-05 NOTE — Progress Notes (Signed)
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  INTERVAL HISTORY: Patient returns to clinic today for routine 84-monthevaluation and to assess her toleration of letrozole.  She currently feels well and is asymptomatic. She denies any pain.  She has no neurologic complaints.  She denies any recent fevers or illnesses.  She has a good appetite and denies weight loss.  She has no chest pain, shortness of breath, cough, or hemoptysis.  She denies any nausea, vomiting, constipation, or diarrhea.  She has no urinary complaints.  Patient offers no specific complaints today.  REVIEW OF SYSTEMS:   Review of Systems  Constitutional: Negative.  Negative for fever, malaise/fatigue and weight loss.  Respiratory: Negative.  Negative for cough, hemoptysis and shortness of breath.   Cardiovascular: Negative.  Negative for chest pain and leg swelling.  Gastrointestinal: Negative.  Negative for abdominal pain.  Genitourinary: Negative.  Negative for dysuria.  Musculoskeletal: Negative.  Negative for back pain.  Skin: Negative.  Negative for rash.  Neurological: Negative.  Negative for dizziness, focal weakness, weakness and headaches.  Psychiatric/Behavioral: Negative.  The patient is not nervous/anxious.    As per HPI. Otherwise, a complete review of systems is negative.  PAST MEDICAL HISTORY: Past Medical History:  Diagnosis Date   Arthritis    In hands   [place]    a. s/p successful TEE/DCCV 07/31/2015; b. 01/2016 Amio  added for Afib; c. 11/2017 Amio d/c'd 2/2 bradycardia; d. 12/2018 Back in aflutter-->Zio: Avg HR 74 (41-176) 100% Afl->bb added back; e. CHADS2VASc => 6 (CHF, HTN, age x 2, DM, female)-->eliquis 2.5 bid.   Bradycardia    a. 11/2017 noted during hospitalization @ [facility]-->amio d/c'd by cardiology.   Cataract    Chronic combined systolic (congestive) and diastolic (congestive) heart failure ([place]    a. 06/2015 Echo: EF 40%, basal HK, nl WM of mid and apical segments, mild to mod MR/TR; b. TEE 56/07/4648 nl LV systolic function, mild to mod MR, trivial TR; c. 06/2017 Echo: EF 55-60%, mild LVH, Gr2 DD, triv AI, mild MR, mod dil LA, mildly dil RA, mild to mod TR. PASP 35-449mg; d. 01/2019 Echo: Ef 55-60%, mod dil LA mildly dil RA. Mild TR. Trace MR.   Diabetes mellitus without complication (HCC)    Type II   GERD (gastroesophageal reflux disease)    Glaucoma    Right Eye   Hematoma of abdominal wall    a. 11/2017 Fall-->R flank hematoma w/ anemia req PRBC. Xarelto dc'd at that time.   History of SCC (squamous cell carcinoma) of skin 08/20/2020   left forearm EDC   Hyperlipidemia    Hypertension    Moderate mitral regurgitation    a. see echo from 06/2015 and TEE 07/2015; b. 06/2017 Echo: Mild MR.   [place]   [facility].  [facility] (later dc'd 11/2017 2/2 bradycardia).   Right renal mass    [facility] upper pole renal mass concerning for renal cell carcinoma-->outpt f/u  w/ urology.    PAST SURGICAL HISTORY: Past Surgical History:  Procedure Laterality Date   BREAST BIOPSY Right 01/14/2021   [place] bx 10:00 coil path pending   CATARACT EXTRACTION Bilateral    CESAREAN SECTION     ELECTROPHYSIOLOGIC STUDY N/A 07/31/2015   Procedure: CARDIOVERSION;  Surgeon: Wende Bushy, MD;  Location: [facility] ORS;  Service: Cardiovascular;  Laterality: N/A;   ELECTROPHYSIOLOGIC STUDY N/A 04/03/2016   Procedure: CARDIOVERSION;  Surgeon:  Minna Merritts, MD;  Location: [facility] ORS;  Service: Cardiovascular;  Laterality: N/A;   TEE WITHOUT CARDIOVERSION N/A 07/31/2015   Procedure: TRANSESOPHAGEAL ECHOCARDIOGRAM (TEE);  Surgeon: Wende Bushy, MD;  Location: [facility] ORS;  Service: Cardiovascular;  Laterality: N/A;   WISDOM TOOTH EXTRACTION     Wrist Surgery Left     FAMILY HISTORY: Family History  Problem Relation Age of Onset   Cataracts Mother    Heart attack Mother    Heart attack Father    Heart attack Brother    Aneurysm Daughter    Heart attack Maternal Grandfather    Heart attack Paternal Grandfather     ADVANCED DIRECTIVES (Y/N):  N  HEALTH MAINTENANCE: Social History   Tobacco Use   Smoking status: Never   Smokeless tobacco: Never   Tobacco comments:    smoking cessation materials not required  Vaping Use   Vaping Use: Never used  Substance Use Topics   Alcohol use: No   Drug use: No     Colonoscopy:  PAP:  Bone density:  Lipid panel:  No Known Allergies  Current Outpatient Medications  Medication Sig Dispense Refill   amLODipine (NORVASC) 10 MG tablet Take 1 tablet (10 mg total) by mouth daily. 90 tablet 3   apixaban (ELIQUIS) 5 MG TABS tablet Take 1 tablet (5 mg total) by mouth 2 (two) times daily. 180 tablet 2   atorvastatin (LIPITOR) 20 MG tablet Take 1 tablet (20 mg total) by mouth at bedtime. 90 tablet 0   Cholecalciferol (VITAMIN D3) 1.25 MG (50000 UT) CAPS Take 1 capsule by mouth once a week.     furosemide (LASIX) 20 MG tablet Take 1 tablet (20 mg total) by mouth daily. 30 tablet 2   insulin glargine (LANTUS) 100 UNIT/ML injection Inject 12 Units into the skin Nightly.     letrozole (FEMARA) 2.5 MG tablet Take 1 tablet (2.5 mg total) by mouth daily. 90 tablet 3   lisinopril (ZESTRIL) 40 MG tablet Take 1 tablet (40 mg total) by mouth daily. 90 tablet 0   metoprolol tartrate (LOPRESSOR) 25 MG tablet TAKE 1/2 TABLET BY MOUTH TWICE DAILY 60 tablet 5   Vitamin D, Ergocalciferol, (DRISDOL)  1.25 MG (50000 UNIT) CAPS capsule Take 50,000 Units by mouth once a week.     alendronate (FOSAMAX) 70 MG tablet Take 1 tablet (70 mg total) by mouth once a week. Take with a full glass of water on an empty stomach. 12 tablet 3   Blood Pressure KIT Check blood pressure daily or as needed if tired or fatigued; dx I10 (Patient not taking: Reported on 05/06/2021) 1 each 0   No current facility-administered medications for this visit.    OBJECTIVE: Vitals:   05/06/21 1334  BP: 139/68  Pulse: 82  Resp: 20  Temp: 98.7 F (37.1 C)  SpO2: (!) 10%     Body mass index is 23.63 kg/m.    ECOG FS:0 - Asymptomatic  General: Thin, no acute distress. Eyes: Pink conjunctiva, anicteric sclera. HEENT: Normocephalic,  moist mucous membranes. Lungs: No audible wheezing or coughing. Heart: Regular rate and rhythm. Abdomen: Soft, nontender, no obvious distention. Musculoskeletal: No edema, cyanosis, or clubbing. Neuro: Alert, answering all questions appropriately. Cranial nerves grossly intact. Skin: No rashes or petechiae noted. Psych: Normal affect.  LAB RESULTS:  Lab Results  Component Value Date   NA 140 07/03/2020   K 4.3 07/03/2020   CL 103 07/03/2020   CO2 22 07/03/2020   GLUCOSE 156 (H) 07/03/2020   BUN 12 07/03/2020   CREATININE 0.75 07/03/2020   CALCIUM 9.5 07/03/2020   PROT 6.1 (L) 12/06/2017   ALBUMIN 3.4 (L) 12/06/2017   AST 33 12/06/2017   ALT 22 12/06/2017   ALKPHOS 57 12/06/2017   BILITOT 1.4 (H) 12/06/2017   GFRNONAA 83 01/05/2019   GFRAA 96 01/05/2019    Lab Results  Component Value Date   WBC 6.9 07/03/2020   NEUTROABS 5.1 07/03/2020   HGB 12.9 07/03/2020   HCT 37.8 07/03/2020   MCV 94 07/03/2020   PLT 178 07/03/2020     STUDIES: No results found.  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  PLAN:    Clinical stage IIa ER/PR positive HER-2 negative invasive carcinoma of  the upper outer quadrant of the right breast: Patient previously declined surgery, chemotherapy, and radiation therapy, but did agree to initiate letrozole daily until intolerable side effects or progression of disease.  No further intervention is needed at this time.  Return to clinic in 3 months with repeat laboratory work and further evaluation.  Consider repeat mammogram in April 2023. Osteoporosis: Patient underwent bone mineral density on March 10, 2021 which revealed a T score of -4.6.  She has been instructed to continue calcium and vitamin D supplementation and was given a prescription for Fosamax today.  Repeat in December 2023.  I spent a total of 20 minutes reviewing chart data, face-to-face evaluation with the patient, counseling and coordination of care as detailed above.    Patient expressed understanding and was in agreement with this plan. She also understands that She can call clinic at any time with any questions, concerns, or complaints.    Cancer Staging  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, right [facility]) Staging form: Breast, AJCC 8th Edition - Clinical stage from 01/23/2021: Stage IIA (cT3, cN0, cM0, G1, ER+, PR+, HER2-) - Signed by Lloyd Huger, MD on 01/23/2021 Stage prefix: Initial diagnosis Histologic grading system: 3 grade system  Lloyd Huger, MD   05/07/2021 3:30 PM

## 2021-05-06 ENCOUNTER — Other Ambulatory Visit: Payer: Self-pay

## 2021-05-06 ENCOUNTER — Inpatient Hospital Stay: Payer: Medicare Other | Attending: Oncology | Admitting: Oncology

## 2021-05-06 ENCOUNTER — Encounter: Payer: Self-pay | Admitting: Oncology

## 2021-05-06 VITALS — BP 139/68 | HR 82 | Temp 98.7°F | Resp 20 | Wt 142.0 lb

## 2021-05-06 DIAGNOSIS — I110 Hypertensive heart disease with heart failure: Secondary | ICD-10-CM | POA: Insufficient documentation

## 2021-05-06 DIAGNOSIS — C50411 Malignant neoplasm of upper-outer quadrant of right female breast: Secondary | ICD-10-CM | POA: Insufficient documentation

## 2021-05-06 DIAGNOSIS — K219 Gastro-esophageal reflux disease without esophagitis: Secondary | ICD-10-CM | POA: Diagnosis not present

## 2021-05-06 DIAGNOSIS — I340 Nonrheumatic mitral (valve) insufficiency: Secondary | ICD-10-CM | POA: Diagnosis not present

## 2021-05-06 DIAGNOSIS — E1136 Type 2 diabetes mellitus with diabetic cataract: Secondary | ICD-10-CM | POA: Diagnosis not present

## 2021-05-06 DIAGNOSIS — I5042 Chronic combined systolic (congestive) and diastolic (congestive) heart failure: Secondary | ICD-10-CM | POA: Insufficient documentation

## 2021-05-06 DIAGNOSIS — Z79811 Long term (current) use of aromatase inhibitors: Secondary | ICD-10-CM | POA: Diagnosis not present

## 2021-05-06 DIAGNOSIS — I480 Paroxysmal atrial fibrillation: Secondary | ICD-10-CM | POA: Diagnosis not present

## 2021-05-06 DIAGNOSIS — Z794 Long term (current) use of insulin: Secondary | ICD-10-CM | POA: Diagnosis not present

## 2021-05-06 DIAGNOSIS — Z85828 Personal history of other malignant neoplasm of skin: Secondary | ICD-10-CM | POA: Diagnosis not present

## 2021-05-06 DIAGNOSIS — M810 Age-related osteoporosis without current pathological fracture: Secondary | ICD-10-CM | POA: Insufficient documentation

## 2021-05-06 DIAGNOSIS — Z7901 Long term (current) use of anticoagulants: Secondary | ICD-10-CM | POA: Insufficient documentation

## 2021-05-06 DIAGNOSIS — Z170 Estrogen receptor positive status [ER+]: Secondary | ICD-10-CM | POA: Diagnosis not present

## 2021-05-06 DIAGNOSIS — Z79899 Other long term (current) drug therapy: Secondary | ICD-10-CM | POA: Diagnosis not present

## 2021-05-06 MED ORDER — ALENDRONATE SODIUM 70 MG PO TABS: 70.0000 mg | ORAL_TABLET | ORAL | 3 refills | Status: DC

## 2021-05-22 ENCOUNTER — Other Ambulatory Visit: Payer: Self-pay

## 2021-05-22 MED ORDER — ATORVASTATIN CALCIUM 20 MG PO TABS: 20.0000 mg | ORAL_TABLET | Freq: Every day | ORAL | 0 refills | Status: DC

## 2021-05-22 NOTE — Telephone Encounter (Signed)
*  STAT* If patient is at the pharmacy, call can be transferred to refill team.   1. Which medications need to be refilled? (please list name of each medication and dose if known) Atorvastatin  2. Which pharmacy/location (including street and city if local pharmacy) is medication to be sent to? [facility]  3. Do they need a 30 day or 90 day supply? 89   Patient's daughter states this rx was sent to [facility], but patient called this morning and they have closed unexpectedly permanently.

## 2021-05-28 ENCOUNTER — Encounter: Payer: Medicare Other | Admitting: Dermatology

## 2021-08-05 ENCOUNTER — Ambulatory Visit: Payer: Medicare Other | Admitting: Oncology

## 2021-08-05 ENCOUNTER — Other Ambulatory Visit: Payer: Self-pay | Admitting: *Deleted

## 2021-08-05 MED ORDER — ALENDRONATE SODIUM 70 MG PO TABS: 70.0000 mg | ORAL_TABLET | ORAL | 2 refills | Status: DC

## 2021-08-10 NOTE — Progress Notes (Signed)
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  INTERVAL HISTORY: Patient returns to clinic today for evaluation.  She continues to tolerate letrozole well without significant side effects.  She currently feels well and at her baseline.  She denies any pain.  She has no neurologic complaints.  She denies any recent fevers or illnesses.  She has a good appetite and denies weight loss.  She has no chest pain, shortness of breath, cough, or hemoptysis.  She denies any nausea, vomiting, constipation, or diarrhea.  She has no urinary complaints.  Patient offers no specific complaints today.  REVIEW OF SYSTEMS:   Review of Systems  Constitutional: Negative.  Negative for fever, malaise/fatigue and weight loss.  Respiratory: Negative.  Negative for cough, hemoptysis and shortness of breath.   Cardiovascular: Negative.  Negative for chest pain and leg swelling.  Gastrointestinal: Negative.  Negative for abdominal pain.  Genitourinary: Negative.  Negative for dysuria.  Musculoskeletal: Negative.  Negative for back pain.  Skin: Negative.  Negative for rash.  Neurological: Negative.  Negative for dizziness, focal weakness, weakness and headaches.  Psychiatric/Behavioral: Negative.  The patient is not nervous/anxious.    As per HPI. Otherwise, a complete review of systems is negative.  PAST MEDICAL HISTORY: Past Medical History:  Diagnosis Date   Arthritis    In hands   Atrial flutter ([name])    a. s/p successful TEE/DCCV 07/31/2015;  b. 01/2016 Amio added for Afib; c. 11/2017 Amio d/c'd 2/2 bradycardia; d. 12/2018 Back in aflutter-->Zio: Avg HR 74 (41-176) 100% Afl->bb added back; e. CHADS2VASc => 6 (CHF, HTN, age x 2, DM, female)-->eliquis 2.5 bid.   Bradycardia    a. 11/2017 noted during hospitalization @ [facility]-->amio d/c'd by cardiology.   Cataract    Chronic combined systolic (congestive) and diastolic (congestive) heart failure ([place])    a. 06/2015 Echo: EF 40%, basal HK, nl WM of mid and apical segments, mild to mod MR/TR; b. TEE 05/30/6710: nl LV systolic function, mild to mod MR, trivial TR; c. 06/2017 Echo: EF 55-60%, mild LVH, Gr2 DD, triv AI, mild MR, mod dil LA, mildly dil RA, mild to mod TR. PASP 35-68mHg; d. 01/2019 Echo: Ef 55-60%, mod dil LA mildly dil RA. Mild TR. Trace MR.   Diabetes mellitus without complication (HCC)    Type II   GERD (gastroesophageal reflux disease)    Glaucoma    Right Eye   Hematoma of abdominal wall    a. 11/2017 Fall-->R flank hematoma w/ anemia req PRBC. Xarelto dc'd at that time.   History of SCC (squamous cell carcinoma) of skin 08/20/2020   left forearm EDC   Hyperlipidemia    Hypertension    Moderate mitral regurgitation    a. see echo from 06/2015 and TEE 07/2015; b. 06/2017 Echo: Mild MR.   [place]    [place].  [facility] (later dc'd 11/2017 2/2 bradycardia).   Right renal mass    a. 11/2017 CT Abd ([facility]: 3.4cm R upper pole renal mass concerning for  renal cell carcinoma-->outpt f/u w/ urology.    PAST SURGICAL HISTORY: Past Surgical History:  Procedure Laterality Date   BREAST BIOPSY Right 01/14/2021   [place] bx 10:00 coil path pending   CATARACT EXTRACTION Bilateral    CESAREAN SECTION     ELECTROPHYSIOLOGIC STUDY N/A 07/31/2015   Procedure: CARDIOVERSION;  Surgeon: Wende Bushy, MD;  Location: [facility] ORS;  Service: Cardiovascular;  Laterality: N/A;   ELECTROPHYSIOLOGIC STUDY N/A 04/03/2016   Procedure:  CARDIOVERSION;  Surgeon: Minna Merritts, MD;  Location: [facility] ORS;  Service: Cardiovascular;  Laterality: N/A;   TEE WITHOUT CARDIOVERSION N/A 07/31/2015   Procedure: TRANSESOPHAGEAL ECHOCARDIOGRAM (TEE);  Surgeon: Wende Bushy, MD;  Location: [facility] ORS;  Service: Cardiovascular;  Laterality: N/A;   WISDOM TOOTH EXTRACTION     Wrist Surgery Left     FAMILY HISTORY: Family History  Problem Relation Age of Onset   Cataracts Mother    Heart attack Mother    Heart attack Father    Heart attack Brother    Aneurysm Daughter    Heart attack Maternal Grandfather    Heart attack Paternal Grandfather     ADVANCED DIRECTIVES (Y/N):  N  HEALTH MAINTENANCE: Social History   Tobacco Use   Smoking status: Never   Smokeless tobacco: Never   Tobacco comments:    smoking cessation materials not required  Vaping Use   Vaping Use: Never used  Substance Use Topics   Alcohol use: No   Drug use: No     Colonoscopy:  PAP:  Bone density:  Lipid panel:  No Known Allergies  Current Outpatient Medications  Medication Sig Dispense Refill   alendronate (FOSAMAX) 70 MG tablet Take 1 tablet (70 mg total) by mouth once a week. Take with a full glass of water on an empty stomach. 12 tablet 2   amLODipine (NORVASC) 10 MG tablet Take 1 tablet (10 mg total) by mouth daily. 90 tablet 3   apixaban (ELIQUIS) 5 MG TABS tablet Take 1 tablet (5 mg total) by mouth 2 (two) times daily. 180 tablet 2   atorvastatin (LIPITOR) 20 MG tablet Take 1 tablet (20 mg total) by mouth at bedtime. 90 tablet 0   Cholecalciferol (VITAMIN D3) 1.25 MG (50000 UT) CAPS Take 1 capsule by mouth once a week.     furosemide (LASIX) 20 MG tablet Take 1 tablet (20 mg total) by mouth daily. 30 tablet 2   insulin glargine (LANTUS) 100 UNIT/ML injection Inject 12 Units into the skin Nightly.     letrozole (FEMARA) 2.5 MG tablet Take 1 tablet (2.5 mg total) by mouth daily. 90 tablet 3   lisinopril (ZESTRIL) 40 MG tablet Take 1 tablet (40  mg total) by mouth daily. 90 tablet 0   metoprolol tartrate (LOPRESSOR) 25 MG tablet TAKE 1/2 TABLET BY MOUTH TWICE DAILY 60 tablet 5   Vitamin D, Ergocalciferol, (DRISDOL) 1.25 MG (50000 UNIT) CAPS capsule Take 50,000 Units by mouth once a week.     Blood Pressure KIT Check blood pressure daily or as needed if tired or fatigued; dx I10 (Patient not taking: Reported on 05/06/2021) 1 each 0   No current facility-administered medications for this visit.    OBJECTIVE: Vitals:   08/12/21 1508  BP: (!) 143/66  Pulse: 63  Resp: 16  Temp: (!) 97.3 F (36.3 C)  SpO2: 98%     Body mass index is 23.3 kg/m.    ECOG FS:0 - Asymptomatic  General: Thin, no acute distress.  Sitting  in a wheelchair. Eyes: Pink conjunctiva, anicteric sclera. HEENT: Normocephalic, moist mucous membranes. Lungs: No audible wheezing or coughing. Heart: Regular rate and rhythm. Abdomen: Soft, nontender, no obvious distention. Musculoskeletal: No edema, cyanosis, or clubbing. Neuro: Alert, answering all questions appropriately. Cranial nerves grossly intact. Skin: No rashes or petechiae noted. Psych: Normal affect.   LAB RESULTS:  Lab Results  Component Value Date   NA 140 07/03/2020   K 4.3 07/03/2020   CL 103 07/03/2020   CO2 22 07/03/2020   GLUCOSE 156 (H) 07/03/2020   BUN 12 07/03/2020   CREATININE 0.75 07/03/2020   CALCIUM 9.5 07/03/2020   PROT 6.1 (L) 12/06/2017   ALBUMIN 3.4 (L) 12/06/2017   AST 33 12/06/2017   ALT 22 12/06/2017   ALKPHOS 57 12/06/2017   BILITOT 1.4 (H) 12/06/2017   GFRNONAA 83 01/05/2019   GFRAA 96 01/05/2019    Lab Results  Component Value Date   WBC 6.9 07/03/2020   NEUTROABS 5.1 07/03/2020   HGB 12.9 07/03/2020   HCT 37.8 07/03/2020   MCV 94 07/03/2020   PLT 178 07/03/2020     STUDIES: No results found.  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  PLAN:    Clinical stage  IIa ER/PR positive HER-2 negative invasive carcinoma of the upper outer quadrant of the right breast: Patient previously declined surgery, chemotherapy, and radiation therapy, but did agree to initiate letrozole daily until intolerable side effects or progression of disease.  No further intervention is needed at this time.  Patient's daughter has requested less frequent follow-up.  Patient will have mammogram in October 2023 and then follow-up 1 to 2 days later.  At which point she can be transition to evaluation every 6 months.   Osteoporosis: Patient underwent bone mineral density on March 10, 2021 which revealed a T score of -4.6.  Continue Fosamax, calcium, and vitamin D.  Repeat in December 2023.   Patient expressed understanding and was in agreement with this plan. She also understands that She can call clinic at any time with any questions, concerns, or complaints.    Cancer Staging  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, right [facility]) Staging form: Breast, AJCC 8th Edition - Clinical stage from 01/23/2021: Stage IIA (cT3, cN0, cM0, G1, ER+, PR+, HER2-) - Signed by Lloyd Huger, MD on 01/23/2021 Stage prefix: Initial diagnosis Histologic grading system: 3 grade system  Lloyd Huger, MD   08/15/2021 9:49 AM

## 2021-08-12 ENCOUNTER — Encounter: Payer: Self-pay | Admitting: Oncology

## 2021-08-12 ENCOUNTER — Inpatient Hospital Stay: Payer: Medicare Other | Attending: Oncology | Admitting: Oncology

## 2021-08-12 VITALS — BP 143/66 | HR 63 | Temp 97.3°F | Resp 16 | Ht 65.0 in | Wt 140.0 lb

## 2021-08-12 DIAGNOSIS — C50411 Malignant neoplasm of upper-outer quadrant of right female breast: Secondary | ICD-10-CM

## 2021-08-12 DIAGNOSIS — Z79811 Long term (current) use of aromatase inhibitors: Secondary | ICD-10-CM | POA: Insufficient documentation

## 2021-08-12 DIAGNOSIS — Z170 Estrogen receptor positive status [ER+]: Secondary | ICD-10-CM | POA: Diagnosis not present

## 2021-08-12 DIAGNOSIS — M818 Other osteoporosis without current pathological fracture: Secondary | ICD-10-CM | POA: Diagnosis not present

## 2021-08-13 ENCOUNTER — Other Ambulatory Visit: Payer: Self-pay | Admitting: *Deleted

## 2021-08-13 DIAGNOSIS — C50411 Malignant neoplasm of upper-outer quadrant of right female breast: Secondary | ICD-10-CM

## 2021-08-20 ENCOUNTER — Other Ambulatory Visit: Payer: Self-pay | Admitting: Cardiovascular Disease

## 2021-08-29 ENCOUNTER — Encounter: Payer: Self-pay | Admitting: *Deleted

## 2021-09-03 ENCOUNTER — Ambulatory Visit: Payer: Medicare Other | Admitting: Cardiovascular Disease

## 2021-09-18 ENCOUNTER — Ambulatory Visit: Payer: Medicare Other | Admitting: Cardiovascular Disease

## 2021-09-22 ENCOUNTER — Other Ambulatory Visit: Payer: Self-pay | Admitting: Cardiovascular Disease

## 2021-10-23 ENCOUNTER — Other Ambulatory Visit: Payer: Self-pay | Admitting: Cardiovascular Disease

## 2021-12-24 ENCOUNTER — Other Ambulatory Visit: Payer: Self-pay | Admitting: Cardiovascular Disease

## 2021-12-24 NOTE — Telephone Encounter (Signed)
Prescription refill request for Eliquis received. Indication:Afib Last office visit:12/22 IOM:BTDHR labs Age: 88 Weight:63.5 kg  Prescription refilled

## 2021-12-24 NOTE — Telephone Encounter (Signed)
Refill request for Eliquis

## 2021-12-30 ENCOUNTER — Encounter: Payer: Self-pay | Admitting: Cardiovascular Disease

## 2022-01-01 ENCOUNTER — Other Ambulatory Visit
Admission: RE | Admit: 2022-01-01 | Discharge: 2022-01-01 | Disposition: A | Discharge status: Home / Self Care | Payer: Medicare Other | Source: Ambulatory Visit | Attending: Cardiovascular Disease | Admitting: Cardiovascular Disease

## 2022-01-01 ENCOUNTER — Encounter: Payer: Self-pay | Admitting: Cardiovascular Disease

## 2022-01-01 ENCOUNTER — Ambulatory Visit: Payer: Medicare Other | Attending: Cardiovascular Disease | Admitting: Cardiovascular Disease

## 2022-01-01 VITALS — BP 110/58 | HR 88 | Ht 66.0 in | Wt 139.5 lb

## 2022-01-01 DIAGNOSIS — E785 Hyperlipidemia, unspecified: Secondary | ICD-10-CM

## 2022-01-01 DIAGNOSIS — I4821 Permanent atrial fibrillation: Secondary | ICD-10-CM | POA: Diagnosis not present

## 2022-01-01 DIAGNOSIS — I10 Essential (primary) hypertension: Secondary | ICD-10-CM | POA: Diagnosis not present

## 2022-01-01 LAB — CBC WITH DIFFERENTIAL/PLATELET
Abs Immature Granulocytes: 0.04 10*3/uL (ref 0.00–0.07)
Basophils Absolute: 0 10*3/uL (ref 0.0–0.1)
Basophils Relative: 1 %
Eosinophils Absolute: 0.1 10*3/uL (ref 0.0–0.5)
Eosinophils Relative: 1 %
HCT: 39.5 % (ref 36.0–46.0)
Hemoglobin: 13.3 g/dL (ref 12.0–15.0)
Immature Granulocytes: 1 %
Lymphocytes Relative: 17 %
Lymphs Abs: 1.2 10*3/uL (ref 0.7–4.0)
MCH: 31.2 pg (ref 26.0–34.0)
MCHC: 33.7 g/dL (ref 30.0–36.0)
MCV: 92.7 fL (ref 80.0–100.0)
Monocytes Absolute: 0.5 10*3/uL (ref 0.1–1.0)
Monocytes Relative: 7 %
Neutro Abs: 5 10*3/uL (ref 1.7–7.7)
Neutrophils Relative %: 73 %
Platelets: 173 10*3/uL (ref 150–400)
RBC: 4.26 MIL/uL (ref 3.87–5.11)
RDW: 13.2 % (ref 11.5–15.5)
WBC: 6.8 10*3/uL (ref 4.0–10.5)
nRBC: 0 % (ref 0.0–0.2)

## 2022-01-01 LAB — LIPID PANEL
Cholesterol: 140 mg/dL (ref 0–200)
HDL: 49 mg/dL (ref >40)
LDL Cholesterol: 72 mg/dL (ref 0–99)
Total CHOL/HDL Ratio: 2.9 RATIO
Triglycerides: 94 mg/dL (ref <150)
VLDL: 19 mg/dL (ref 0–40)

## 2022-01-01 LAB — COMPREHENSIVE METABOLIC PANEL
ALT: 12 U/L (ref 0–44)
AST: 20 U/L (ref 15–41)
Albumin: 3.9 g/dL (ref 3.5–5.0)
Alkaline Phosphatase: 64 U/L (ref 38–126)
Anion gap: 11 (ref 5–15)
BUN: 12 mg/dL (ref 8–23)
CO2: 22 mmol/L (ref 22–32)
Calcium: 9 mg/dL (ref 8.9–10.3)
Chloride: 106 mmol/L (ref 98–111)
Creatinine, Ser: 0.78 mg/dL (ref 0.44–1.00)
GFR, Estimated: >60 mL/min (ref >60)
Glucose, Bld: 214 mg/dL — ABNORMAL HIGH (ref 70–99)
Potassium: 3.8 mmol/L (ref 3.5–5.1)
Sodium: 139 mmol/L (ref 135–145)
Total Bilirubin: 0.8 mg/dL (ref 0.3–1.2)
Total Protein: 7 g/dL (ref 6.5–8.1)

## 2022-01-01 NOTE — Progress Notes (Signed)
Cardiology Office Note   Date:  01/01/2022   ID:  Amber Wolfe, DOB 09-04-1931, MRN [id number]  PCP:  Remi Haggard, FNP  Cardiologist:   Kathlyn Sacramento, MD   Chief Complaint  Patient presents with   Other    6 Month f/u c/o not feeling like herself, fatigue and no energy. Meds reviewed verbally with pt.      History of Present Illness: Amber Wolfe is a 88 y.o. female who presents for a follow-up visit regarding chronic atrial flutter/fibrillation.  Other medical problems include diabetes mellitus, hypertension, hyperlipidemia and moderate mitral regurgitation. She had a nuclear stress test done in March 2017 which showed no evidence of ischemia or perfusion defects with normal ejection fraction. She had mildly reduced ejection fraction the setting of atrial fibrillation and tachycardia that subsequently normalized.   She had prior bradycardia with beta-blockers and amiodarone but he is tolerating small dose metoprolol at the present time.   She was hospitalized in September 2019 after a fall that was complicated by large flank hematoma.  Xarelto was subsequently switched to small dose Eliquis.   Most recent echocardiogram in July 2022 showed normal LV systolic function with trivial mitral regurgitation.  She was diagnosed with right breast cancer last year and declined surgery, chemotherapy or radiation.  She is being treated with hormone therapy.  She has been doing reasonably well with no chest pain.  She reports increased fatigue and shortness of breath.  She had a minor fall recently but this was the only fall in the last 6 months.  Past Medical History:  Diagnosis Date   Arthritis    In hands   Atrial flutter ([place])    a. s/p successful TEE/DCCV 07/31/2015; b. 01/2016 Amio added for Afib; c. 11/2017 Amio d/c'd 2/2 bradycardia; d. 12/2018 Back in aflutter-->Zio: Avg HR 74 (41-176) 100% Afl->bb added back; e. CHADS2VASc => 6 (CHF, HTN, age x 2, DM, female)-->eliquis 2.5  bid.   Bradycardia    a. 11/2017 noted during hospitalization @ [facility]-->amio d/c'd by cardiology.   Cataract    Chronic combined systolic (congestive) and diastolic (congestive) heart failure ([place])    a. 06/2015 Echo: EF 40%, basal HK, nl WM of mid and apical segments, mild to mod MR/TR; b. TEE 7/0/1410: nl LV systolic function, mild to mod MR, trivial TR; c. 06/2017 Echo: EF 55-60%, mild LVH, Gr2 DD, triv AI, mild MR, mod dil LA, mildly dil RA, mild to mod TR. PASP 35-59mHg; d. 01/2019 Echo: Ef 55-60%, mod dil LA mildly dil RA. Mild TR. Trace MR.   Diabetes mellitus without complication (HCC)    Type II   GERD (gastroesophageal reflux disease)    Glaucoma    Right Eye   Hematoma of abdominal wall    a. 11/2017 Fall-->R flank hematoma w/ anemia req PRBC. Xarelto dc'd at that time.   History of SCC (squamous cell carcinoma) of skin 08/20/2020   left forearm EDC   Hyperlipidemia    Hypertension    Moderate mitral regurgitation    a. see echo from 06/2015 and TEE 07/2015; b. 06/2017 Echo: Mild MR.   PAF [place] fibrillation) [place]    a.  [facility]io added (later dc'd 11/2017 2/2 bradycardia).   Right renal mass    a. 11/2017 CT Abd ([facility]: 3.4cm R upper pole renal mass concerning for renal cell carcinoma-->outpt f/u w/ urology.    Past Surgical History:  Procedure  Laterality Date   BREAST BIOPSY Right 01/14/2021   [place] bx 10:00 coil path pending   CATARACT EXTRACTION Bilateral    CESAREAN SECTION     ELECTROPHYSIOLOGIC STUDY N/A 07/31/2015   Procedure: CARDIOVERSION;  Surgeon: Wende Bushy, MD;  Location: [facility] ORS;  Service: Cardiovascular;  Laterality: N/A;   ELECTROPHYSIOLOGIC STUDY N/A 04/03/2016   Procedure: CARDIOVERSION;  Surgeon: Minna Merritts, MD;  Location: [facility] ORS;  Service: Cardiovascular;  Laterality: N/A;   TEE WITHOUT CARDIOVERSION N/A 07/31/2015   Procedure: TRANSESOPHAGEAL ECHOCARDIOGRAM (TEE);  Surgeon: Wende Bushy, MD;  Location:  [facility] ORS;  Service: Cardiovascular;  Laterality: N/A;   WISDOM TOOTH EXTRACTION     Wrist Surgery Left      Current Outpatient Medications  Medication Sig Dispense Refill   alendronate (FOSAMAX) 70 MG tablet Take 1 tablet (70 mg total) by mouth once a week. Take with a full glass of water on an empty stomach. 12 tablet 2   amLODipine (NORVASC) 10 MG tablet Take 1 tablet (10 mg total) by mouth daily. 90 tablet 3   apixaban (ELIQUIS) 5 MG TABS tablet Take 1 tablet (5 mg total) by mouth 2 (two) times daily. NEEDS LABS FOR ELIQUIS REFILLS, PLEASE CALL OFFICE 60 tablet 0   atorvastatin (LIPITOR) 20 MG tablet Take 1 tablet (20 mg total) by mouth at bedtime. 30 tablet 0   Blood Pressure KIT Check blood pressure daily or as needed if tired or fatigued; dx I10 1 each 0   Cholecalciferol (VITAMIN D3) 1.25 MG (50000 UT) CAPS Take 1 capsule by mouth once a week.     furosemide (LASIX) 20 MG tablet Take 1 tablet (20 mg total) by mouth daily. 30 tablet 2   insulin glargine (LANTUS) 100 UNIT/ML injection Inject 12 Units into the skin Nightly.     letrozole (FEMARA) 2.5 MG tablet Take 1 tablet (2.5 mg total) by mouth daily. 90 tablet 3   lisinopril (ZESTRIL) 40 MG tablet Take 1 tablet (40 mg total) by mouth daily. 90 tablet 0   metoprolol tartrate (LOPRESSOR) 25 MG tablet TAKE 1/2 TABLET BY MOUTH TWICE DAILY 60 tablet 5   Vitamin D, Ergocalciferol, (DRISDOL) 1.25 MG (50000 UNIT) CAPS capsule Take 50,000 Units by mouth once a week.     No current facility-administered medications for this visit.    Allergies:   Patient has no known allergies.    Social History:  The patient  reports that she has never smoked. She has never used smokeless tobacco. She reports that she does not drink alcohol and does not use drugs.   Family History:  The patient's family history includes Aneurysm in her daughter; Cataracts in her mother; Heart attack in her brother, father, maternal grandfather, mother, and paternal  grandfather.    ROS:  Please see the history of present illness.   Otherwise, review of systems are positive for none.   All other systems are reviewed and negative.    PHYSICAL EXAM: VS:  BP (!) 110/58 (BP Location: Left Arm, Patient Position: Sitting, Cuff Size: Normal)   Pulse 88   Ht 5' 6"  (1.676 m)   Wt 139 lb 8 oz (63.3 kg)   SpO2 98%   BMI 22.52 kg/m  , BMI Body mass index is 22.52 kg/m. GEN: Well nourished, well developed, in no acute distress  HEENT: normal  Neck: no JVD, carotid bruits, or masses Cardiac: Irregularly irregular; no  rubs, or gallops,no edema . 2/6 crescendo decrescendo systolic murmur in  the aortic area which is early peaking Respiratory:  clear to auscultation bilaterally, normal work of breathing GI: soft, nontender, nondistended, + BS MS: no deformity or atrophy  Skin: warm and dry, no rash Neuro:  Strength and sensation are intact Psych: euthymic mood, full affect   EKG:  EKG is ordered today. The ekg ordered today demonstrates atrial fibrillation with a ventricular rate of 88 bpm, left axis deviation, moderate LVH with nonspecific T wave changes.    Recent Labs: No results found for requested labs within last 365 days.    Lipid Panel    Component Value Date/Time   CHOL 132 06/21/2017 1414   CHOL 159 05/15/2015 1619   TRIG 97 06/21/2017 1414   TRIG 124 05/15/2015 1619   HDL 55 06/21/2017 1414   CHOLHDL 2.4 06/21/2017 1414   VLDL 15 06/15/2016 1459   VLDL 25 05/15/2015 1619   LDLCALC 59 06/21/2017 1414      Wt Readings from Last 3 Encounters:  01/01/22 139 lb 8 oz (63.3 kg)  08/12/21 140 lb (63.5 kg)  05/06/21 142 lb (64.4 kg)          No data to display            ASSESSMENT AND PLAN:  1.  Chronic atrial fibrillation: Ventricular rate is well controlled on metoprolol 12.5 mg twice daily with no symptoms suggestive of bradycardia.   Continue anticoagulation with Eliquis 5 mg twice daily.  Check routine labs  today.   2. Essential hypertension: Blood pressure is controlled on current medications  3. Hyperlipidemia: She continues to take atorvastatin 20 mg daily.  I did send lipid and liver profile.  4.  History of cardiomyopathy and moderate mitral regurgitation: Most recent echocardiogram showed normal ejection fraction and trivial mitral regurgitation.  5.  Breast cancer: Being treated with hormone therapy.    Disposition:   FU with me in 6 months  Signed,  Kathlyn Sacramento, MD  01/01/2022 4:45 PM    [place]

## 2022-01-01 NOTE — Patient Instructions (Signed)
Medication Instructions:  Your physician recommends that you continue on your current medications as directed. Please refer to the Current Medication list given to you today.  *If you need a refill on your cardiac medications before your next appointment, please call your pharmacy*   Lab Work: [place] today  Please have your labs drawn at the [facility]. Stop at the Registration desk to check in.  If you have labs (blood work) drawn today and your tests are completely normal, you will receive your results only by: [facility] (if you have MyChart) OR A paper copy in the mail If you have any lab test that is abnormal or we need to change your treatment, we will call you to review the results.   Testing/Procedures: None ordered   Follow-Up: At [facility], you and your health needs are our priority.  As part of our continuing mission to provide you with exceptional heart care, we have created designated Provider Care Teams.  These Care Teams include your primary Cardiologist (physician) and Advanced Practice Providers (APPs -  Physician Assistants and Nurse Practitioners) who all work together to provide you with the care you need, when you need it.  We recommend signing up for the patient portal called "MyChart".  Sign up information is provided on this After Visit Summary.  MyChart is used to connect with patients for Virtual Visits (Telemedicine).  Patients are able to view lab/test results, encounter notes, upcoming appointments, etc.  Non-urgent messages can be sent to your provider as well.   To learn more about what you can do with MyChart, go to [email or web address].    Your next appointment:   Your physician wants you to follow-up in: 6 months You will receive a reminder letter in the mail two months in advance. If you don't receive a letter, please call our office to schedule the follow-up appointment.   The format for your next appointment:    In Person  Provider:   You may see Kathlyn Sacramento, MD or one of the following Advanced Practice Providers on your designated Care Team:   Murray Hodgkins, NP Christell Faith, PA-C Cadence Kathlen Mody, PA-C Gerrie Nordmann, NP     Other Instructions N/A  Important Information About Sugar

## 2022-01-05 ENCOUNTER — Ambulatory Visit
Admission: RE | Admit: 2022-01-05 | Discharge: 2022-01-05 | Disposition: A | Discharge status: Home / Self Care | Payer: Medicare Other | Source: Ambulatory Visit | Attending: Oncology | Admitting: Oncology

## 2022-01-05 DIAGNOSIS — C50411 Malignant neoplasm of upper-outer quadrant of right female breast: Secondary | ICD-10-CM | POA: Diagnosis present

## 2022-01-08 NOTE — Progress Notes (Signed)
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  INTERVAL HISTORY: Patient returns to clinic today for routine 86-monthevaluation.  She continues to tolerate letrozole well without significant side effects.  She reports that she has now changed her mind and wishes to pursue surgery.  She currently feels well and is asymptomatic.  She denies any pain.  She has no neurologic complaints.  She denies any recent fevers or illnesses.  She has a good appetite and denies weight loss.  She has no chest pain, shortness of breath, cough, or hemoptysis.  She denies any nausea, vomiting, constipation, or diarrhea.  She has no urinary complaints.  Patient offers no specific complaints today.  REVIEW OF SYSTEMS:   Review of Systems  Constitutional: Negative.  Negative for fever, malaise/fatigue and weight loss.  Respiratory: Negative.  Negative for cough, hemoptysis and shortness of breath.   Cardiovascular: Negative.  Negative for chest pain and leg swelling.  Gastrointestinal: Negative.  Negative for abdominal pain.  Genitourinary: Negative.  Negative for dysuria.  Musculoskeletal: Negative.  Negative for back pain.  Skin: Negative.  Negative for rash.  Neurological: Negative.  Negative for dizziness, focal weakness, weakness and headaches.  Psychiatric/Behavioral: Negative.  The patient is not nervous/anxious.     As per HPI. Otherwise, a complete review of systems is negative.  PAST MEDICAL HISTORY: Past Medical History:   Diagnosis Date   Arthritis    In hands   [place]    a. s/p successful TEE/DCCV 07/31/2015; b. 01/2016 Amio added for Afib; c. 11/2017 Amio d/c'd 2/2 bradycardia; d. 12/2018 Back in aflutter-->Zio: Avg HR 74 (41-176) 100% Afl->bb added back; e. CHADS2VASc => 6 (CHF, HTN, age x 2, DM, female)-->eliquis 2.5 bid.   Bradycardia    a. 11/2017 noted during hospitalization @ [facility]-->amio d/c'd by cardiology.   Cataract    Chronic combined systolic (congestive) and diastolic (congestive) heart failure ([place]    a. 06/2015 Echo: EF 40%, basal HK, nl WM of mid and apical segments, mild to mod MR/TR; b. TEE 58/03/1570 nl LV systolic function, mild to mod MR, trivial TR; c. 06/2017 Echo: EF 55-60%, mild LVH, Gr2 DD, triv AI, mild MR, mod dil LA, mildly dil RA, mild to mod TR. PASP 35-482mg; d. 01/2019 Echo: Ef 55-60%, mod dil LA mildly dil RA. Mild TR. Trace MR.   Diabetes mellitus without complication (HCC)    Type II   GERD (gastroesophageal reflux disease)    Glaucoma    Right Eye   Hematoma of abdominal wall    a. 11/2017 Fall-->R flank hematoma w/ anemia req PRBC. Xarelto dc'd at that time.   History of SCC (squamous cell carcinoma) of skin 08/20/2020   left forearm EDC   Hyperlipidemia    Hypertension    Moderate mitral regurgitation    a. see echo from 06/2015 and TEE 07/2015; b. 06/2017 Echo: Mild MR.   [place]   [facility]  [facility] (later dc'd 11/2017 2/2 bradycardia).   Right  renal mass    a. 11/2017 CT Abd [facility]): 3.4cm R upper pole renal mass concerning for renal cell carcinoma-->outpt f/u w/ urology.    PAST SURGICAL HISTORY: Past Surgical History:  Procedure Laterality Date   BREAST BIOPSY Right 01/14/2021   [place] bx 10:00 coil path pending   CATARACT EXTRACTION Bilateral    CESAREAN SECTION     ELECTROPHYSIOLOGIC STUDY N/A 07/31/2015   Procedure: CARDIOVERSION;  Surgeon: Wende Bushy, MD;  Location: [facility] ORS;   Service: Cardiovascular;  Laterality: N/A;   ELECTROPHYSIOLOGIC STUDY N/A 04/03/2016   Procedure: CARDIOVERSION;  Surgeon: Minna Merritts, MD;  Location: [facility] ORS;  Service: Cardiovascular;  Laterality: N/A;   TEE WITHOUT CARDIOVERSION N/A 07/31/2015   Procedure: TRANSESOPHAGEAL ECHOCARDIOGRAM (TEE);  Surgeon: Wende Bushy, MD;  Location: [facility] ORS;  Service: Cardiovascular;  Laterality: N/A;   WISDOM TOOTH EXTRACTION     Wrist Surgery Left     FAMILY HISTORY: Family History  Problem Relation Age of Onset   Cataracts Mother    Heart attack Mother    Heart attack Father    Aneurysm Daughter    Heart attack Maternal Grandfather    Heart attack Paternal Grandfather    Heart attack Brother    Breast cancer Neg Hx     ADVANCED DIRECTIVES (Y/N):  N  HEALTH MAINTENANCE: Social History   Tobacco Use   Smoking status: Never   Smokeless tobacco: Never   Tobacco comments:    smoking cessation materials not required  Vaping Use   Vaping Use: Never used  Substance Use Topics   Alcohol use: No   Drug use: No     Colonoscopy:  PAP:  Bone density:  Lipid panel:  No Known Allergies  Current Outpatient Medications  Medication Sig Dispense Refill   alendronate (FOSAMAX) 70 MG tablet Take 1 tablet (70 mg total) by mouth once a week. Take with a full glass of water on an empty stomach. 12 tablet 2   amLODipine (NORVASC) 10 MG tablet Take 1 tablet (10 mg total) by mouth daily. 90 tablet 3   apixaban (ELIQUIS) 5 MG TABS tablet Take 1 tablet (5 mg total) by mouth 2 (two) times daily. NEEDS LABS FOR ELIQUIS REFILLS, PLEASE CALL OFFICE 60 tablet 0   atorvastatin (LIPITOR) 20 MG tablet Take 1 tablet (20 mg total) by mouth at bedtime. 30 tablet 0   Blood Pressure KIT Check blood pressure daily or as needed if tired or fatigued; dx I10 1 each 0   Cholecalciferol (VITAMIN D3) 1.25 MG (50000 UT) CAPS Take 1 capsule by mouth once a week.     furosemide (LASIX) 20 MG tablet Take 1 tablet (20 mg  total) by mouth daily. 30 tablet 2   insulin glargine (LANTUS) 100 UNIT/ML injection Inject 12 Units into the skin Nightly.     lisinopril (ZESTRIL) 40 MG tablet Take 1 tablet (40 mg total) by mouth daily. 90 tablet 0   metoprolol tartrate (LOPRESSOR) 25 MG tablet TAKE 1/2 TABLET BY MOUTH TWICE DAILY 60 tablet 5   Vitamin D, Ergocalciferol, (DRISDOL) 1.25 MG (50000 UNIT) CAPS capsule Take 50,000 Units by mouth once a week.     letrozole (FEMARA) 2.5 MG tablet Take 1 tablet (2.5 mg total) by mouth daily. 90 tablet 3   No current facility-administered medications for this visit.    OBJECTIVE: Vitals:   01/15/22 1409  BP: 134/80  Pulse: 94  Resp: 16  Temp: 98.3 F (36.8 C)  SpO2: 100%  Body mass index is 22.6 kg/m.    ECOG FS:0 - Asymptomatic  General: Thin, no acute distress. Eyes: Pink conjunctiva, anicteric sclera. HEENT: Normocephalic, moist mucous membranes. Breast: Exam deferred today. Lungs: No audible wheezing or coughing. Heart: Regular rate and rhythm. Abdomen: Soft, nontender, no obvious distention. Musculoskeletal: No edema, cyanosis, or clubbing. Neuro: Alert, answering all questions appropriately. Cranial nerves grossly intact. Skin: No rashes or petechiae noted. Psych: Normal affect.   LAB RESULTS:  Lab Results  Component Value Date   NA 139 01/01/2022   K 3.8 01/01/2022   CL 106 01/01/2022   CO2 22 01/01/2022   GLUCOSE 214 (H) 01/01/2022   BUN 12 01/01/2022   CREATININE 0.78 01/01/2022   CALCIUM 9.0 01/01/2022   PROT 7.0 01/01/2022   ALBUMIN 3.9 01/01/2022   AST 20 01/01/2022   ALT 12 01/01/2022   ALKPHOS 64 01/01/2022   BILITOT 0.8 01/01/2022   GFRNONAA >60 01/01/2022   GFRAA 96 01/05/2019    Lab Results  Component Value Date   WBC 6.8 01/01/2022   NEUTROABS 5.0 01/01/2022   HGB 13.3 01/01/2022   HCT 39.5 01/01/2022   MCV 92.7 01/01/2022   PLT 173 01/01/2022     STUDIES: MM DIAG BREAST TOMO BILATERAL  Result Date:  01/05/2022 CLINICAL DATA:  88 year old female with diagnosis of invasive mammary carcinoma with lobular features of the right breast biopsied in October of 2022. The patient is not a surgical candidate, and has not undergone chemotherapy or radiation therapy. She has been on letrozole. EXAM: DIGITAL DIAGNOSTIC BILATERAL MAMMOGRAM WITH TOMOSYNTHESIS; ULTRASOUND RIGHT BREAST LIMITED TECHNIQUE: Bilateral digital diagnostic mammography and breast tomosynthesis was performed.; Targeted ultrasound examination of the right breast was performed COMPARISON:  Previous exam(s). ACR Breast Density Category b: There are scattered areas of fibroglandular density. FINDINGS: The distorted mass corresponding with the biopsy-proven malignancy is again seen in the lateral anterior right breast. There are pleomorphic calcifications extending laterally from the cancer which are increasing in number. No other new suspicious calcifications, masses or areas of distortion are seen in the bilateral breasts. Ultrasound of the right breast at 10 o'clock, 4 cm from the nipple demonstrates a persistent irregular hypoechoic mass, with measurement estimated at 3.4 x 1.3 x 2.6 cm, previously 2.3 x 1.8 x 1.9 cm. Instead of one solid mass as seen on the prior ultrasound, the mass now appears as discontinuous clumped masses. Normal lymph nodes are found in the right axilla. IMPRESSION: 1. Increasing suspicious calcifications are noted lateral to the biopsy-proven malignancy. By ultrasound, the mass has increased in size, though now appears more discontinuous rather than one solid mass as seen on the prior exam. 2.  No evidence of right axillary lymphadenopathy. 3.  No evidence of left breast malignancy. RECOMMENDATION: Continued treatment plan with imaging follow-up as clinically indicated. I have discussed the findings and recommendations with the patient. If applicable, a reminder letter will be sent to the patient regarding the next appointment.  BI-RADS CATEGORY  6: Known biopsy-proven malignancy. Electronically Signed   By: Ammie Ferrier M.D.   On: 01/05/2022 14:48  US Breast Limited Uni Right Inc Axilla  Result Date: 01/05/2022 CLINICAL DATA:  88 year old female with diagnosis of invasive mammary carcinoma with lobular features of the right breast biopsied in October of 2022. The patient is not a surgical candidate, and has not undergone chemotherapy or radiation therapy. She has been on letrozole. EXAM: DIGITAL DIAGNOSTIC BILATERAL MAMMOGRAM WITH TOMOSYNTHESIS; ULTRASOUND RIGHT BREAST LIMITED TECHNIQUE: Bilateral digital  diagnostic mammography and breast tomosynthesis was performed.; Targeted ultrasound examination of the right breast was performed COMPARISON:  Previous exam(s). ACR Breast Density Category b: There are scattered areas of fibroglandular density. FINDINGS: The distorted mass corresponding with the biopsy-proven malignancy is again seen in the lateral anterior right breast. There are pleomorphic calcifications extending laterally from the cancer which are increasing in number. No other new suspicious calcifications, masses or areas of distortion are seen in the bilateral breasts. Ultrasound of the right breast at 10 o'clock, 4 cm from the nipple demonstrates a persistent irregular hypoechoic mass, with measurement estimated at 3.4 x 1.3 x 2.6 cm, previously 2.3 x 1.8 x 1.9 cm. Instead of one solid mass as seen on the prior ultrasound, the mass now appears as discontinuous clumped masses. Normal lymph nodes are found in the right axilla. IMPRESSION: 1. Increasing suspicious calcifications are noted lateral to the biopsy-proven malignancy. By ultrasound, the mass has increased in size, though now appears more discontinuous rather than one solid mass as seen on the prior exam. 2.  No evidence of right axillary lymphadenopathy. 3.  No evidence of left breast malignancy. RECOMMENDATION: Continued treatment plan with imaging follow-up as  clinically indicated. I have discussed the findings and recommendations with the patient. If applicable, a reminder letter will be sent to the patient regarding the next appointment. BI-RADS CATEGORY  6: Known biopsy-proven malignancy. Electronically Signed   By: Ammie Ferrier M.D.   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  She has now changed her mind and wishes to pursue surgical intervention.  She will likely require mastectomy.  A referral has been sent to surgery for further evaluation.  Continue letrozole as prescribed.  Return to clinic in 3 months for further evaluation.  If patient declines surgery, this appointment can be changed to 6 months.    Osteoporosis: Patient underwent bone mineral density on March 10, 2021 which revealed a T score of -4.6.  Continue Fosamax, calcium, and vitamin D.  Repeat in December 2023.  I spent a total of 30 minutes reviewing chart data, face-to-face evaluation with the patient, counseling and coordination of care as detailed above.   Patient expressed understanding and was in agreement with this plan. She also understands that She can call clinic at any time with any questions, concerns, or complaints.    Cancer Staging  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, right [facility]) Staging form: Breast, AJCC 8th Edition - Clinical stage from 01/23/2021: Stage IIA (cT3, cN0, cM0, G1, ER+, PR+, HER2-) - Signed by Lloyd Huger, MD on 01/23/2021 Stage prefix: Initial diagnosis Histologic grading system: 3 grade system  Lloyd Huger, MD   01/16/2022 8:12 AM

## 2022-01-15 ENCOUNTER — Encounter: Payer: Self-pay | Admitting: Cardiovascular Disease

## 2022-01-15 ENCOUNTER — Encounter: Payer: Self-pay | Admitting: Oncology

## 2022-01-15 ENCOUNTER — Inpatient Hospital Stay: Payer: Medicare Other | Attending: Oncology | Admitting: Oncology

## 2022-01-15 VITALS — BP 134/80 | HR 94 | Temp 98.3°F | Resp 16 | Ht 66.0 in | Wt 140.0 lb

## 2022-01-15 DIAGNOSIS — K219 Gastro-esophageal reflux disease without esophagitis: Secondary | ICD-10-CM | POA: Diagnosis not present

## 2022-01-15 DIAGNOSIS — Z170 Estrogen receptor positive status [ER+]: Secondary | ICD-10-CM | POA: Insufficient documentation

## 2022-01-15 DIAGNOSIS — E785 Hyperlipidemia, unspecified: Secondary | ICD-10-CM | POA: Diagnosis not present

## 2022-01-15 DIAGNOSIS — E1136 Type 2 diabetes mellitus with diabetic cataract: Secondary | ICD-10-CM | POA: Insufficient documentation

## 2022-01-15 DIAGNOSIS — I5042 Chronic combined systolic (congestive) and diastolic (congestive) heart failure: Secondary | ICD-10-CM | POA: Diagnosis not present

## 2022-01-15 DIAGNOSIS — I480 Paroxysmal atrial fibrillation: Secondary | ICD-10-CM | POA: Diagnosis not present

## 2022-01-15 DIAGNOSIS — C50411 Malignant neoplasm of upper-outer quadrant of right female breast: Secondary | ICD-10-CM | POA: Diagnosis present

## 2022-01-15 DIAGNOSIS — Z794 Long term (current) use of insulin: Secondary | ICD-10-CM | POA: Insufficient documentation

## 2022-01-15 DIAGNOSIS — Z79899 Other long term (current) drug therapy: Secondary | ICD-10-CM | POA: Insufficient documentation

## 2022-01-15 DIAGNOSIS — Z7901 Long term (current) use of anticoagulants: Secondary | ICD-10-CM | POA: Diagnosis not present

## 2022-01-15 DIAGNOSIS — Z85828 Personal history of other malignant neoplasm of skin: Secondary | ICD-10-CM | POA: Insufficient documentation

## 2022-01-15 DIAGNOSIS — I110 Hypertensive heart disease with heart failure: Secondary | ICD-10-CM | POA: Diagnosis not present

## 2022-01-15 DIAGNOSIS — Z79811 Long term (current) use of aromatase inhibitors: Secondary | ICD-10-CM | POA: Insufficient documentation

## 2022-01-15 DIAGNOSIS — Z923 Personal history of irradiation: Secondary | ICD-10-CM | POA: Diagnosis not present

## 2022-01-15 DIAGNOSIS — M810 Age-related osteoporosis without current pathological fracture: Secondary | ICD-10-CM | POA: Diagnosis not present

## 2022-01-15 MED ORDER — LETROZOLE 2.5 MG PO TABS: 2.5000 mg | ORAL_TABLET | Freq: Every day | ORAL | 3 refills | Status: AC

## 2022-01-21 ENCOUNTER — Other Ambulatory Visit: Payer: Self-pay | Admitting: Cardiovascular Disease

## 2022-01-21 DIAGNOSIS — I4821 Permanent atrial fibrillation: Secondary | ICD-10-CM

## 2022-01-21 DIAGNOSIS — I484 Atypical atrial flutter: Secondary | ICD-10-CM

## 2022-01-21 NOTE — Progress Notes (Incomplete)
Patient ID: Amber Wolfe, female   DOB: May 14, 1931, 88 y.o.   MRN: [id number]  Chief Complaint:  ***  History of Present Illness Amber Wolfe is a 88 y.o. female with ***.  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   INTERVAL HISTORY: Patient returns to clinic today for routine 68-monthevaluation.  She continues to tolerate letrozole well without significant side effects.  She reports that she has now changed her mind and wishes to pursue surgery.  She currently feels well and is asymptomatic.  She denies any pain.  She has no neurologic complaints.  She denies any recent fevers or illnesses.  She has a good appetite and denies weight loss.  She has no chest pain, shortness of breath, cough, or hemoptysis.  She denies any nausea, vomiting, constipation, or diarrhea.  She has no urinary complaints.  Patient offers no specific complaints today.  Past Medical History Past Medical History:  Diagnosis Date  . Arthritis    In hands  . [place]rial [place]utter ([place]    a. s/p successful TEE/DCCV 07/31/2015; b. 01/2016 Amio added for Afib; c. 11/2017 Amio d/c'd 2/2 bradycardia; d. 12/2018 Back in aflutter-->Zio: Avg HR 74 (41-176) 100% Afl->bb added back; e. CHADS2VASc => 6 (CHF, HTN, age x 2, DM, female)-->eliquis 2.5 bid.  . Bradycardia    a. 11/2017 noted during hospitalization @ [facility]-->amio d/c'd by cardiology.  . Cataract   . Chronic combined systolic (congestive) and diastolic (congestive) heart failure ([place]    a. 06/2015 Echo: EF 40%, basal HK, nl WM of mid and apical segments, mild to mod MR/TR; b. TEE 53/04/9516 nl LV systolic function, mild to mod MR, trivial TR; c. 06/2017 Echo: EF 55-60%, mild LVH, Gr2 DD, triv AI, mild MR, mod dil LA, mildly dil RA, mild to mod TR. PASP 35-430mg; d. 01/2019 Echo: Ef 55-60%, mod dil LA mildly dil RA. Mild TR. Trace MR.  . Diabetes mellitus without complication (HCC)    Type II  .  GERD (gastroesophageal reflux disease)   . Glaucoma    Right Eye  . Hematoma of abdominal wall    a. 11/2017 Fall-->R flank hematoma w/ anemia req PRBC. Xarelto dc'd at that time.  . Marland Kitchenistory of SCC (squamous cell carcinoma) of skin 08/20/2020   left forearm EDC  . Hyperlipidemia   . Hypertension   . Moderate mitral regurgitation    a. see echo from 06/2015 and TEE 07/2015; b. 06/2017 Echo: Mild MR.  . Marland KitchenAF (paroxysmal atrial fibrillation) ([place]   [facility].  [facility] (later dc'd 11/2017 2/2 bradycardia).  . Right renal mass    [facility] renal mass concerning for renal cell carcinoma-->outpt f/u w/ urology.      Past Surgical History:  Procedure Laterality Date  . BREAST BIOPSY Right 01/14/2021   usKoreax 10:00 coil path pending  . CATARACT EXTRACTION Bilateral   . CESAREAN SECTION    . ELECTROPHYSIOLOGIC STUDY N/A 07/31/2015   Procedure: CARDIOVERSION;  Surgeon: AiWende BushyMD;  Location: [facility] ORS;  Service: Cardiovascular;  Laterality: N/A;  . ELECTROPHYSIOLOGIC STUDY N/A 04/03/2016   Procedure: CARDIOVERSION;  Surgeon: TiMinna MerrittsMD;  Location: [facility] ORS;  Service: Cardiovascular;  Laterality: N/A;  . TEE WITHOUT CARDIOVERSION N/A 07/31/2015   Procedure: TRANSESOPHAGEAL ECHOCARDIOGRAM (TEE);  Surgeon: AiWende BushyMD;  [facility];  Service: Cardiovascular;  Laterality: N/A;  . WISDOM TOOTH EXTRACTION    . Wrist Surgery Left     No Known Allergies  Current Outpatient Medications  Medication Sig Dispense Refill  . alendronate (FOSAMAX) 70 MG tablet Take 1 tablet (70 mg total) by mouth once a week. Take with a full glass of water on an empty stomach. 12 tablet 2  . amLODipine (NORVASC) 10 MG tablet Take 1 tablet (10 mg total) by mouth daily. 90 tablet 3  . apixaban (ELIQUIS) 5 MG TABS tablet Take 1 tablet (5 mg total) by mouth 2 (two) times daily. 180 tablet 1  . atorvastatin (LIPITOR) 20 MG tablet Take 1  tablet (20 mg total) by mouth at bedtime. 30 tablet 4  . Blood Pressure KIT Check blood pressure daily or as needed if tired or fatigued; dx I10 1 each 0  . Cholecalciferol (VITAMIN D3) 1.25 MG (50000 UT) CAPS Take 1 capsule by mouth once a week.    . furosemide (LASIX) 20 MG tablet Take 1 tablet (20 mg total) by mouth daily. 30 tablet 2  . insulin glargine (LANTUS) 100 UNIT/ML injection Inject 12 Units into the skin Nightly.    Marland Kitchen letrozole (FEMARA) 2.5 MG tablet Take 1 tablet (2.5 mg total) by mouth daily. 90 tablet 3  . lisinopril (ZESTRIL) 40 MG tablet Take 1 tablet (40 mg total) by mouth daily. 90 tablet 0  . metoprolol tartrate (LOPRESSOR) 25 MG tablet TAKE 1/2 TABLET BY MOUTH TWICE DAILY 60 tablet 5  . Vitamin D, Ergocalciferol, (DRISDOL) 1.25 MG (50000 UNIT) CAPS capsule Take 50,000 Units by mouth once a week.     No current facility-administered medications for this visit.    Family History Family History  Problem Relation Age of Onset  . Cataracts Mother   . Heart attack Mother   . Heart attack Father   . Aneurysm Daughter   . Heart attack Maternal Grandfather   . Heart attack Paternal Grandfather   . Heart attack Brother   . Breast cancer Neg Hx       Social History Social History   Tobacco Use  . Smoking status: Never  . Smokeless tobacco: Never  . Tobacco comments:    smoking cessation materials not required  Vaping Use  . Vaping Use: Never used  Substance Use Topics  . Alcohol use: No  . Drug use: No        ROS ***   Physical Exam There were no vitals taken for this visit.   CONSTITUTIONAL: Well developed, and nourished, appropriately responsive and aware without distress. ***  EYES: Sclera non-icteric.   EARS, NOSE, MOUTH AND THROAT: Mask worn.  *** The oropharynx is clear. Oral mucosa is pink and moist.  Dentition: ***   Hearing is intact to voice.  NECK: Trachea is midline, and there is no jugular venous distension.  LYMPH NODES:  Lymph nodes in  the neck are not enlarged. RESPIRATORY:  Lungs are clear, and breath sounds are equal bilaterally. Normal respiratory effort without pathologic use of accessory muscles. CARDIOVASCULAR: Heart is regular in rate and rhythm. GI: The abdomen is *** soft, nontender, and nondistended. There were no palpable masses. I did not appreciate hepatosplenomegaly. There were normal bowel sounds. GU: *** MUSCULOSKELETAL:  Symmetrical muscle tone appreciated in all four extremities.    SKIN: Skin turgor is normal. No pathologic skin lesions appreciated.  NEUROLOGIC:  Motor and sensation appear grossly normal.  Cranial nerves are grossly  without defect. PSYCH:  Alert and oriented to person, place and time. Affect is appropriate for situation.  Data Reviewed I have personally reviewed what is currently available of the patient's imaging, recent labs and medical records.   Labs:     Latest Ref Rng & Units 01/01/2022    4:53 PM 07/03/2020    9:36 AM 01/05/2019    8:48 AM  CBC  WBC 4.0 - 10.5 K/uL 6.8  6.9  5.9   Hemoglobin 12.0 - 15.0 g/dL 13.3  12.9  12.2   Hematocrit 36.0 - 46.0 % 39.5  37.8  35.7   Platelets 150 - 400 K/uL 173  178  162       Latest Ref Rng & Units 01/01/2022    4:53 PM 07/03/2020    9:36 AM 01/05/2019    8:48 AM  CMP  Glucose 70 - 99 mg/dL 214  156  110   BUN 8 - 23 mg/dL 12  12  13    Creatinine 0.44 - 1.00 mg/dL 0.78  0.75  0.60   Sodium 135 - 145 mmol/L 139  140  142   Potassium 3.5 - 5.1 mmol/L 3.8  4.3  4.0   Chloride 98 - 111 mmol/L 106  103  105   CO2 22 - 32 mmol/L 22  22  25    Calcium 8.9 - 10.3 mg/dL 9.0  9.5  9.6   Total Protein 6.5 - 8.1 g/dL 7.0     Total Bilirubin 0.3 - 1.2 mg/dL 0.8     Alkaline Phos 38 - 126 U/L 64     AST 15 - 41 U/L 20     ALT 0 - 44 U/L 12      SURGICAL PATHOLOGY  * THIS IS AN ADDENDUM REPORT *  CASE: [id number]  PATIENT: Amber Wolfe  Surgical Pathology Report  *Addendum *   Reason for Addendum #1:  Breast Biomarker Results    Specimen Submitted:  A. Breast, right, 10 o'clock; biopsy   Clinical History: Palpable right breast mass with nipple inversion, 6.8  cm by mammographic measurement of mass and calcifications, at least 5 cm  by ultrasound measurement.  Post-biopsy mammograms show appropriate positioning of the COIL shaped  biopsy marking clip at the site of biopsy in the UPPER OUTER RIGHT  breast, along the superior aspect of the mass.   DIAGNOSIS:  A. BREAST, RIGHT, 10 O'CLOCK; ULTRASOUND-GUIDED CORE BIOPSY:  - INVASIVE MAMMARY CARCINOMA WITH LOBULAR FEATURES.   Size of invasive carcinoma: 13 mm in this sample  Histologic grade of invasive carcinoma: Grade 1                       Glandular/tubular differentiation score: 3                       Nuclear pleomorphism score: 1                       Mitotic rate score: 1                       Total score: 5  Ductal carcinoma in situ: Not identified  Lobular carcinoma in situ: Present  Lymphovascular invasion: Not identified   ER/PR/HER2: Immunohistochemistry will be performed on block A1, with  reflex to Thomas for HER2 2+. The results will be reported in an addendum.   Comment:  The definitive classification and  grade will be assigned on the  excisional specimen.   GROSS DESCRIPTION:  A. Labeled: Right ultrasound breast biopsy 10:00 4 cm nipple  Received: Formalin  Time/date in fixative: Collected and placed in formalin at 1:19 PM on  01/14/2021  Cold ischemic time: Less than 1 minute  Total fixation time: Approximately 7 hours  Core pieces: 4 cores and multiple additional fragments  Size: Range from 1-2.2 cm in length and 0.2 cm in diameter  Description: Received are cores and fragments of yellow fibrofatty  tissue.  The additional fragments are 0.8 x 0.3 x 0.2 cm in aggregate.  Ink color: Black  Entirely submitted in cassettes 1-2 with 3 cores in cassette 1 and 1  core with the remaining fragments in cassette 2.   RB 01/14/2021   Final  Diagnosis performed by Bryan Lemma, MD.   Electronically signed  01/15/2021 9:47:47AM  The electronic signature indicates that the named Attending Pathologist  has evaluated the specimen  Technical component performed at [facility], [address], [place],  [place] [address] Lab: [phone] Dir: Rush Farmer, MD, MMM   Professional component performed at [facility], [facility], [place], [place], [place] [address] Lab: [phone]  Dir: Kathi Simpers, MD   ADDENDUM:  CASE SUMMARY: BREAST BIOMARKER TESTS  Estrogen Receptor (ER) Status: POSITIVE          Percentage of cells with nuclear positivity: Greater than 90%          Average intensity of staining: Strong   Progesterone Receptor (PgR) Status: POSITIVE          Percentage of cells with nuclear positivity: Greater than 90%          Average intensity of staining: Strong   HER2 (by immunohistochemistry): NEGATIVE (Score 0)  Ki-67: Not performed    Imaging: Radiology review:  CLINICAL DATA:  88 year old female with diagnosis of invasive mammary carcinoma with lobular features of the right breast biopsied in October of 2022. The patient is not a surgical candidate, and has not undergone chemotherapy or radiation therapy. She has been on letrozole.   EXAM: DIGITAL DIAGNOSTIC BILATERAL MAMMOGRAM WITH TOMOSYNTHESIS; ULTRASOUND RIGHT BREAST LIMITED   TECHNIQUE: Bilateral digital diagnostic mammography and breast tomosynthesis was performed.; Targeted ultrasound examination of the right breast was performed   COMPARISON:  Previous exam(s).   ACR Breast Density Category b: There are scattered areas of fibroglandular density.   FINDINGS: The distorted mass corresponding with the biopsy-proven malignancy is again seen in the lateral anterior right breast. There are pleomorphic calcifications extending laterally from the cancer which are increasing in number. No other new suspicious  calcifications, masses or areas of distortion are seen in the bilateral breasts.   Ultrasound of the right breast at 10 o'clock, 4 cm from the nipple demonstrates a persistent irregular hypoechoic mass, with measurement estimated at 3.4 x 1.3 x 2.6 cm, previously 2.3 x 1.8 x 1.9 cm. Instead of one solid mass as seen on the prior ultrasound, the mass now appears as discontinuous clumped masses. Normal lymph nodes are found in the right axilla.   IMPRESSION: 1. Increasing suspicious calcifications are noted lateral to the biopsy-proven malignancy. By ultrasound, the mass has increased in size, though now appears more discontinuous rather than one solid mass as seen on the prior exam.   2.  No evidence of right axillary lymphadenopathy.   3.  No evidence of left breast malignancy.   RECOMMENDATION: Continued treatment plan with imaging follow-up  as clinically indicated.   I have discussed the findings and recommendations with the patient. If applicable, a reminder letter will be sent to the patient regarding the next appointment.   BI-RADS CATEGORY  6: Known biopsy-proven malignancy.     Electronically Signed   By: Ammie Ferrier M.D.   On: 01/05/2022 14:48 Within last 24 hrs: No results found.  Assessment    *** Patient Active Problem List   Diagnosis Date Noted  . 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, right ([place]) 01/23/2021  . Osteoporosis 12/17/2016  . Bradycardia 12/17/2016  . Microalbuminuria 06/17/2016  . Typical atrial flutter ([place])   . SOB (shortness of breath)   . Palpitations 02/17/2016  . Mitral regurgitation 02/17/2016  . Left calcaneal bursitis 09/19/2015  . Atypical atrial flutter ([place]) 06/18/2015  . Hyperlipidemia 06/18/2015  . Primary osteoarthritis of right knee 05/20/2015  . Breast cancer screening 05/20/2015  . Diabetes mellitus with neuropathy ([place]) 05/15/2015  . Essential hypertension, benign 05/15/2015  . Medication  monitoring encounter 05/15/2015  . Osteoarthritis of both hands 05/15/2015  . Decreased hearing of both ears 05/15/2015  . Atrial fibrillation ([place]) 05/15/2015  . Fracture of distal radius and ulna, left, closed, initial encounter 04/25/2014    Plan    ***  Face-to-face time spent with the patient and accompanying care providers(if present) was *** minutes, with more than 50% of the time spent counseling, educating, and coordinating care of the patient.    These notes generated with voice recognition software. I apologize for typographical errors.  Ronny Bacon M.D., FACS 01/21/2022, 5:01 PM

## 2022-01-21 NOTE — Telephone Encounter (Signed)
Refill request

## 2022-01-22 ENCOUNTER — Ambulatory Visit: Payer: Medicare Other | Admitting: Surgery

## 2022-01-22 ENCOUNTER — Encounter: Payer: Self-pay | Admitting: Oncology

## 2022-03-25 ENCOUNTER — Other Ambulatory Visit: Payer: Medicare Other

## 2022-04-16 ENCOUNTER — Ambulatory Visit: Payer: Medicare Other | Admitting: Oncology

## 2022-04-17 ENCOUNTER — Other Ambulatory Visit: Payer: Self-pay | Admitting: Cardiovascular Disease

## 2022-04-28 ENCOUNTER — Other Ambulatory Visit: Payer: Self-pay | Admitting: Oncology

## 2022-05-04 ENCOUNTER — Other Ambulatory Visit: Payer: Self-pay | Admitting: Cardiovascular Disease

## 2022-05-04 DIAGNOSIS — I484 Atypical atrial flutter: Secondary | ICD-10-CM

## 2022-05-04 DIAGNOSIS — I4821 Permanent atrial fibrillation: Secondary | ICD-10-CM

## 2022-05-04 NOTE — Telephone Encounter (Signed)
Prescription refill request for Eliquis received. Indication:afib Last office visit:10/23 Scr:0.7  10/23 Age: 89 Weight:63.5  kg  Prescription refilled

## 2022-05-04 NOTE — Telephone Encounter (Signed)
Please review

## 2022-06-02 ENCOUNTER — Other Ambulatory Visit: Payer: Self-pay

## 2022-06-02 MED ORDER — ATORVASTATIN CALCIUM 20 MG PO TABS: 20.0000 mg | ORAL_TABLET | Freq: Every day | ORAL | 0 refills | Status: DC

## 2022-07-14 ENCOUNTER — Inpatient Hospital Stay: Payer: Medicare Other | Attending: Oncology | Admitting: Oncology

## 2022-07-14 ENCOUNTER — Ambulatory Visit: Payer: Medicare Other | Admitting: Oncology

## 2022-07-14 ENCOUNTER — Encounter: Payer: Self-pay | Admitting: Oncology

## 2022-07-14 VITALS — BP 143/79 | HR 77 | Temp 96.7°F | Resp 16 | Ht 66.0 in | Wt 142.0 lb

## 2022-07-14 DIAGNOSIS — Z170 Estrogen receptor positive status [ER+]: Secondary | ICD-10-CM | POA: Insufficient documentation

## 2022-07-14 DIAGNOSIS — Z79811 Long term (current) use of aromatase inhibitors: Secondary | ICD-10-CM

## 2022-07-14 DIAGNOSIS — M810 Age-related osteoporosis without current pathological fracture: Secondary | ICD-10-CM | POA: Diagnosis not present

## 2022-07-14 DIAGNOSIS — Z79899 Other long term (current) drug therapy: Secondary | ICD-10-CM | POA: Insufficient documentation

## 2022-07-14 DIAGNOSIS — C50411 Malignant neoplasm of upper-outer quadrant of right female breast: Secondary | ICD-10-CM | POA: Insufficient documentation

## 2022-07-14 NOTE — Progress Notes (Signed)
Last OV patient was supposed to be referred to surgery and follow up with you in 3 months. Patient has decided against surgery and any further imaging. Daughter would like to make follow up appointments yearly.

## 2022-07-14 NOTE — Addendum Note (Signed)
Addended by: Luz Lex on: 07/14/2022 10:28 AM   Modules accepted: Orders

## 2022-07-14 NOTE — Progress Notes (Signed)
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  INTERVAL HISTORY: Patient returns to clinic today for routine 75-month evaluation.  Given her advanced age, patient elected not to pursue surgery.  She continues to take letrozole and is tolerating her treatments well.  She has no neurologic complaints.  She denies any recent fevers or illnesses.  She has a good appetite and denies weight loss.  She has no chest pain, shortness of breath, cough, or hemoptysis.  She denies any nausea, vomiting, constipation, or diarrhea.  She has no urinary complaints.  Patient offers no specific complaints today.  REVIEW OF SYSTEMS:   Review of Systems  Constitutional: Negative.  Negative for fever, malaise/fatigue and weight loss.  Respiratory: Negative.  Negative for cough, hemoptysis and shortness of breath.   Cardiovascular: Negative.  Negative for chest pain and leg swelling.  Gastrointestinal: Negative.  Negative for abdominal pain.  Genitourinary: Negative.  Negative for dysuria.  Musculoskeletal: Negative.  Negative for back pain.  Skin: Negative.  Negative for rash.  Neurological: Negative.  Negative for dizziness, focal weakness, weakness and headaches.  Psychiatric/Behavioral: Negative.  The patient is not nervous/anxious.     As per HPI. Otherwise, a complete review of systems is negative.  PAST MEDICAL HISTORY: Past Medical History:  Diagnosis Date   Arthritis    In hands   Atrial flutter    a. s/p successful  TEE/DCCV 07/31/2015; b. 01/2016 Amio added for Afib; c. 11/2017 Amio d/c'd 2/2 bradycardia; d. 12/2018 Back in aflutter-->Zio: Avg HR 74 (41-176) 100% Afl->bb added back; e. CHADS2VASc => 6 (CHF, HTN, age x 2, DM, female)-->eliquis 2.5 bid.   Bradycardia    a. 11/2017 noted during hospitalization @ [facility]-->amio d/c'd by cardiology.   Cataract    Chronic combined systolic (congestive) and diastolic (congestive) heart failure    a. 06/2015 Echo: EF 40%, basal HK, nl WM of mid and apical segments, mild to mod MR/TR; b. TEE 07/31/2015: nl LV systolic function, mild to mod MR, trivial TR; c. 06/2017 Echo: EF 55-60%, mild LVH, Gr2 DD, triv AI, mild MR, mod dil LA, mildly dil RA, mild to mod TR. PASP 35-22mmHg; d. 01/2019 Echo: Ef 55-60%, mod dil LA mildly dil RA. Mild TR. Trace MR.   Diabetes mellitus without complication    Type II   GERD (gastroesophageal reflux disease)    Glaucoma    Right Eye   Hematoma of abdominal wall    a. 11/2017 Fall-->R flank hematoma w/ anemia req PRBC. Xarelto dc'd at that time.   History of SCC (squamous cell carcinoma) of skin 08/20/2020   left forearm EDC   Hyperlipidemia    Hypertension    Moderate mitral regurgitation    a. see echo from 06/2015 and TEE 07/2015; b. 06/2017 Echo: Mild MR.   PAF (paroxysmal atrial fibrillation)    a.  01/2016 Admitted to [facility] with PAF -->amio added (later dc'd 11/2017 2/2 bradycardia).   Right renal mass    a. 11/2017 CT Abd [facility]): 3.4cm R upper pole renal mass concerning for renal cell  carcinoma-->outpt f/u w/ urology.    PAST SURGICAL HISTORY: Past Surgical History:  Procedure Laterality Date   BREAST BIOPSY Right 01/14/2021   [place] bx 10:00 coil path pending   CATARACT EXTRACTION Bilateral    CESAREAN SECTION     ELECTROPHYSIOLOGIC STUDY N/A 07/31/2015   Procedure: CARDIOVERSION;  Surgeon: Almond Lint, MD;  Location: [facility] ORS;  Service: Cardiovascular;  Laterality: N/A;   ELECTROPHYSIOLOGIC STUDY N/A 04/03/2016   Procedure:  CARDIOVERSION;  Surgeon: Antonieta Iba, MD;  Location: [facility] ORS;  Service: Cardiovascular;  Laterality: N/A;   TEE WITHOUT CARDIOVERSION N/A 07/31/2015   Procedure: TRANSESOPHAGEAL ECHOCARDIOGRAM (TEE);  Surgeon: Almond Lint, MD;  Location: [facility] ORS;  Service: Cardiovascular;  Laterality: N/A;   WISDOM TOOTH EXTRACTION     Wrist Surgery Left     FAMILY HISTORY: Family History  Problem Relation Age of Onset   Cataracts Mother    Heart attack Mother    Heart attack Father    Aneurysm Daughter    Heart attack Maternal Grandfather    Heart attack Paternal Grandfather    Heart attack Brother    Breast cancer Neg Hx     ADVANCED DIRECTIVES (Y/N):  N  HEALTH MAINTENANCE: Social History   Tobacco Use   Smoking status: Never   Smokeless tobacco: Never   Tobacco comments:    smoking cessation materials not required  Vaping Use   Vaping Use: Never used  Substance Use Topics   Alcohol use: No   Drug use: No     Colonoscopy:  PAP:  Bone density:  Lipid panel:  No Known Allergies  Current Outpatient Medications  Medication Sig Dispense Refill   alendronate (FOSAMAX) 70 MG tablet Take 1 tablet (70 mg total) by mouth once a week. Take with a full glass of water on an empty stomach. 12 tablet 0   amLODipine (NORVASC) 10 MG tablet Take 1 tablet (10 mg total) by mouth daily. 90 tablet 3   apixaban (ELIQUIS) 5 MG TABS tablet Take 1 tablet (5 mg total) by mouth 2 (two) times daily. 60 tablet 5   atorvastatin (LIPITOR) 20 MG tablet Take 1 tablet (20 mg total) by mouth at bedtime. Office visit needed for additional refill 30 tablet 0   Blood Pressure KIT Check blood pressure daily or as needed if tired or fatigued; dx I10 1 each 0   Cholecalciferol (VITAMIN D3) 1.25 MG (50000 UT) CAPS Take 1 capsule by mouth once a week.     furosemide (LASIX) 20 MG tablet Take 1 tablet (20 mg total) by mouth daily. 30 tablet 2   insulin glargine (LANTUS) 100 UNIT/ML injection Inject 12 Units into  the skin Nightly.     letrozole (FEMARA) 2.5 MG tablet Take 1 tablet (2.5 mg total) by mouth daily. 90 tablet 3   lisinopril (ZESTRIL) 40 MG tablet Take 1 tablet (40 mg total) by mouth daily. 90 tablet 0   metoprolol tartrate (LOPRESSOR) 25 MG tablet TAKE (1/2) TABLET TWICE DAILY. 30 tablet 2   Vitamin D, Ergocalciferol, (DRISDOL) 1.25 MG (50000 UNIT) CAPS capsule Take 50,000 Units by mouth once a week.     No current facility-administered medications for this visit.    OBJECTIVE: Vitals:   07/14/22 0922  BP: (!) 143/79  Pulse: 77  Resp: 16  Temp: (!) 96.7 F (35.9 C)  SpO2: 100%     Body mass index is 22.92 kg/m.    ECOG FS:0 - Asymptomatic  General: Thin, no acute  distress. Eyes: Pink conjunctiva, anicteric sclera. HEENT: Normocephalic, moist mucous membranes. Breast: Exam deferred today. Lungs: No audible wheezing or coughing. Heart: Regular rate and rhythm. Abdomen: Soft, nontender, no obvious distention. Musculoskeletal: No edema, cyanosis, or clubbing. Neuro: Alert, answering all questions appropriately. Cranial nerves grossly intact. Skin: No rashes or petechiae noted. Psych: Normal affect.  LAB RESULTS:  Lab Results  Component Value Date   NA 139 01/01/2022   K 3.8 01/01/2022   CL 106 01/01/2022   CO2 22 01/01/2022   GLUCOSE 214 (H) 01/01/2022   BUN 12 01/01/2022   CREATININE 0.78 01/01/2022   CALCIUM 9.0 01/01/2022   PROT 7.0 01/01/2022   ALBUMIN 3.9 01/01/2022   AST 20 01/01/2022   ALT 12 01/01/2022   ALKPHOS 64 01/01/2022   BILITOT 0.8 01/01/2022   GFRNONAA >60 01/01/2022   GFRAA 96 01/05/2019    Lab Results  Component Value Date   WBC 6.8 01/01/2022   NEUTROABS 5.0 01/01/2022   HGB 13.3 01/01/2022   HCT 39.5 01/01/2022   MCV 92.7 01/01/2022   PLT 173 01/01/2022     STUDIES: No results found.  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  She was initiated on letrozole which is recommended that she take indefinitely.  No further intervention is needed.  Patient's daughter has requested less frequent follow-up, therefore will coordinate visits along with yearly mammogram.  Return to clinic in November 2024 for repeat mammogram with follow-up 2 to 3 days later. Osteoporosis: Patient underwent bone mineral density on March 10, 2021 which revealed a T score of -4.6.  Continue Fosamax, calcium, and vitamin D.  Repeat in November 2024 along with mammogram.    Patient expressed understanding and was in agreement with this plan. She also understands that She can call clinic at any time with any questions, concerns, or complaints.    Cancer Staging  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, right Staging form: Breast, AJCC 8th Edition - Clinical stage from 01/23/2021: Stage IIA (cT3, cN0, cM0, G1, ER+, PR+, HER2-) - Signed by Jeralyn Ruths, MD on 01/23/2021 Stage prefix: Initial diagnosis Histologic grading system: 3 grade system  Jeralyn Ruths, MD   07/14/2022 10:17 AM

## 2022-08-10 ENCOUNTER — Other Ambulatory Visit: Payer: Self-pay | Admitting: Cardiovascular Disease

## 2022-08-11 ENCOUNTER — Other Ambulatory Visit: Payer: Self-pay | Admitting: Oncology

## 2022-09-30 ENCOUNTER — Encounter: Payer: Self-pay | Admitting: *Deleted

## 2022-10-08 ENCOUNTER — Ambulatory Visit: Payer: Medicare Other | Admitting: Cardiovascular Disease

## 2022-10-09 ENCOUNTER — Encounter: Payer: Self-pay | Admitting: Cardiovascular Disease

## 2022-10-15 ENCOUNTER — Other Ambulatory Visit: Payer: Self-pay | Admitting: Oncology

## 2022-10-28 ENCOUNTER — Other Ambulatory Visit: Payer: Self-pay

## 2022-10-28 ENCOUNTER — Inpatient Hospital Stay: Payer: Medicare Other

## 2022-10-28 ENCOUNTER — Inpatient Hospital Stay
Admission: EM | Admit: 2022-10-28 | Discharge: 2022-11-03 | Disposition: A | Discharge status: Skilled Nursing Facility | Payer: Medicare Other | Attending: Internal Medicine | Admitting: Internal Medicine

## 2022-10-28 ENCOUNTER — Emergency Department: Payer: Medicare Other

## 2022-10-28 DIAGNOSIS — E1165 Type 2 diabetes mellitus with hyperglycemia: Secondary | ICD-10-CM | POA: Diagnosis present

## 2022-10-28 DIAGNOSIS — M810 Age-related osteoporosis without current pathological fracture: Secondary | ICD-10-CM | POA: Diagnosis present

## 2022-10-28 DIAGNOSIS — Z794 Long term (current) use of insulin: Secondary | ICD-10-CM | POA: Diagnosis not present

## 2022-10-28 DIAGNOSIS — H409 Unspecified glaucoma: Secondary | ICD-10-CM | POA: Diagnosis present

## 2022-10-28 DIAGNOSIS — S72141A Displaced intertrochanteric fracture of right femur, initial encounter for closed fracture: Secondary | ICD-10-CM | POA: Diagnosis not present

## 2022-10-28 DIAGNOSIS — R6883 Chills (without fever): Secondary | ICD-10-CM | POA: Diagnosis present

## 2022-10-28 DIAGNOSIS — D696 Thrombocytopenia, unspecified: Secondary | ICD-10-CM | POA: Diagnosis not present

## 2022-10-28 DIAGNOSIS — D62 Acute posthemorrhagic anemia: Secondary | ICD-10-CM | POA: Diagnosis not present

## 2022-10-28 DIAGNOSIS — I5032 Chronic diastolic (congestive) heart failure: Secondary | ICD-10-CM | POA: Diagnosis present

## 2022-10-28 DIAGNOSIS — I110 Hypertensive heart disease with heart failure: Secondary | ICD-10-CM | POA: Diagnosis present

## 2022-10-28 DIAGNOSIS — Z7983 Long term (current) use of bisphosphonates: Secondary | ICD-10-CM

## 2022-10-28 DIAGNOSIS — I10 Essential (primary) hypertension: Secondary | ICD-10-CM | POA: Diagnosis present

## 2022-10-28 DIAGNOSIS — W010XXA Fall on same level from slipping, tripping and stumbling without subsequent striking against object, initial encounter: Secondary | ICD-10-CM | POA: Diagnosis present

## 2022-10-28 DIAGNOSIS — K219 Gastro-esophageal reflux disease without esophagitis: Secondary | ICD-10-CM | POA: Diagnosis present

## 2022-10-28 DIAGNOSIS — E876 Hypokalemia: Secondary | ICD-10-CM | POA: Diagnosis present

## 2022-10-28 DIAGNOSIS — I480 Paroxysmal atrial fibrillation: Secondary | ICD-10-CM | POA: Diagnosis present

## 2022-10-28 DIAGNOSIS — E785 Hyperlipidemia, unspecified: Secondary | ICD-10-CM | POA: Diagnosis present

## 2022-10-28 DIAGNOSIS — I4892 Unspecified atrial flutter: Secondary | ICD-10-CM | POA: Diagnosis present

## 2022-10-28 DIAGNOSIS — I951 Orthostatic hypotension: Secondary | ICD-10-CM | POA: Diagnosis not present

## 2022-10-28 DIAGNOSIS — Y92009 Unspecified place in unspecified non-institutional (private) residence as the place of occurrence of the external cause: Secondary | ICD-10-CM | POA: Diagnosis not present

## 2022-10-28 DIAGNOSIS — E1140 Type 2 diabetes mellitus with diabetic neuropathy, unspecified: Secondary | ICD-10-CM | POA: Diagnosis present

## 2022-10-28 DIAGNOSIS — W19XXXA Unspecified fall, initial encounter: Secondary | ICD-10-CM | POA: Diagnosis present

## 2022-10-28 DIAGNOSIS — Y9301 Activity, walking, marching and hiking: Secondary | ICD-10-CM | POA: Diagnosis present

## 2022-10-28 DIAGNOSIS — I340 Nonrheumatic mitral (valve) insufficiency: Secondary | ICD-10-CM | POA: Diagnosis present

## 2022-10-28 DIAGNOSIS — I4891 Unspecified atrial fibrillation: Secondary | ICD-10-CM | POA: Diagnosis present

## 2022-10-28 DIAGNOSIS — Z85828 Personal history of other malignant neoplasm of skin: Secondary | ICD-10-CM

## 2022-10-28 DIAGNOSIS — Z7901 Long term (current) use of anticoagulants: Secondary | ICD-10-CM | POA: Diagnosis not present

## 2022-10-28 DIAGNOSIS — D72829 Elevated white blood cell count, unspecified: Secondary | ICD-10-CM | POA: Diagnosis present

## 2022-10-28 DIAGNOSIS — Z79811 Long term (current) use of aromatase inhibitors: Secondary | ICD-10-CM

## 2022-10-28 DIAGNOSIS — C50411 Malignant neoplasm of upper-outer quadrant of right female breast: Secondary | ICD-10-CM | POA: Diagnosis present

## 2022-10-28 DIAGNOSIS — Z8249 Family history of ischemic heart disease and other diseases of the circulatory system: Secondary | ICD-10-CM

## 2022-10-28 DIAGNOSIS — Z79899 Other long term (current) drug therapy: Secondary | ICD-10-CM

## 2022-10-28 DIAGNOSIS — R296 Repeated falls: Secondary | ICD-10-CM | POA: Diagnosis present

## 2022-10-28 DIAGNOSIS — S72002A Fracture of unspecified part of neck of left femur, initial encounter for closed fracture: Secondary | ICD-10-CM | POA: Diagnosis present

## 2022-10-28 LAB — APTT: aPTT: 29 seconds (ref 24–36)

## 2022-10-28 LAB — BASIC METABOLIC PANEL
Anion gap: 8 (ref 5–15)
BUN: 20 mg/dL (ref 8–23)
CO2: 22 mmol/L (ref 22–32)
Calcium: 8.8 mg/dL — ABNORMAL LOW (ref 8.9–10.3)
Chloride: 106 mmol/L (ref 98–111)
Creatinine, Ser: 0.97 mg/dL (ref 0.44–1.00)
GFR, Estimated: 56 mL/min — ABNORMAL LOW (ref >60)
Glucose, Bld: 243 mg/dL — ABNORMAL HIGH (ref 70–99)
Potassium: 3.4 mmol/L — ABNORMAL LOW (ref 3.5–5.1)
Sodium: 136 mmol/L (ref 135–145)

## 2022-10-28 LAB — HEPATIC FUNCTION PANEL
ALT: 12 U/L (ref 0–44)
AST: 21 U/L (ref 15–41)
Albumin: 3.8 g/dL (ref 3.5–5.0)
Alkaline Phosphatase: 46 U/L (ref 38–126)
Bilirubin, Direct: 0.2 mg/dL (ref 0.0–0.2)
Indirect Bilirubin: 0.7 mg/dL (ref 0.3–0.9)
Total Bilirubin: 0.9 mg/dL (ref 0.3–1.2)
Total Protein: 6.4 g/dL — ABNORMAL LOW (ref 6.5–8.1)

## 2022-10-28 LAB — TYPE AND SCREEN
ABO/RH(D): A POS
Antibody Screen: NEGATIVE

## 2022-10-28 LAB — CBC
HCT: 37.4 % (ref 36.0–46.0)
Hemoglobin: 12.8 g/dL (ref 12.0–15.0)
MCH: 32.5 pg (ref 26.0–34.0)
MCHC: 34.2 g/dL (ref 30.0–36.0)
MCV: 94.9 fL (ref 80.0–100.0)
Platelets: 178 10*3/uL (ref 150–400)
RBC: 3.94 MIL/uL (ref 3.87–5.11)
RDW: 13.4 % (ref 11.5–15.5)
WBC: 11.9 10*3/uL — ABNORMAL HIGH (ref 4.0–10.5)
nRBC: 0 % (ref 0.0–0.2)

## 2022-10-28 LAB — PROTIME-INR
INR: 1.4 — ABNORMAL HIGH (ref 0.8–1.2)
Prothrombin Time: 17.5 seconds — ABNORMAL HIGH (ref 11.4–15.2)

## 2022-10-28 LAB — LACTIC ACID, PLASMA: Lactic Acid, Venous: 2.6 mmol/L (ref 0.5–1.9)

## 2022-10-28 MED ORDER — POLYETHYLENE GLYCOL 3350 17 G PO PACK: 17.0000 g | PACK | Freq: Every day | ORAL | Status: DC | PRN

## 2022-10-28 MED ORDER — DOCUSATE SODIUM 100 MG PO CAPS
100.0000 mg | ORAL_CAPSULE | Freq: Two times a day (BID) | ORAL | Status: DC
  Administered 2022-10-28: 100 mg via ORAL
  Filled 2022-10-28: qty 1

## 2022-10-28 MED ORDER — IOHEXOL 350 MG/ML SOLN
75.0000 mL | Freq: Once | INTRAVENOUS | Status: AC | PRN
  Administered 2022-10-28: 75 mL via INTRAVENOUS

## 2022-10-28 MED ORDER — MORPHINE SULFATE (PF) 2 MG/ML IV SOLN
2.0000 mg | INTRAVENOUS | Status: DC | PRN
  Administered 2022-10-28: 2 mg via INTRAVENOUS
  Filled 2022-10-28: qty 1

## 2022-10-28 MED ORDER — HYDROCODONE-ACETAMINOPHEN 5-325 MG PO TABS
1.0000 | ORAL_TABLET | Freq: Four times a day (QID) | ORAL | Status: DC | PRN
  Administered 2022-10-29: 1 via ORAL
  Filled 2022-10-28: qty 1

## 2022-10-28 MED ORDER — BISACODYL 5 MG PO TBEC
5.0000 mg | DELAYED_RELEASE_TABLET | Freq: Every day | ORAL | Status: DC | PRN
  Administered 2022-11-01: 5 mg via ORAL
  Filled 2022-10-28: qty 1

## 2022-10-28 MED ORDER — FENTANYL CITRATE PF 50 MCG/ML IJ SOSY
25.0000 ug | PREFILLED_SYRINGE | Freq: Once | INTRAMUSCULAR | Status: AC
  Administered 2022-10-28: 25 ug via INTRAVENOUS
  Filled 2022-10-28: qty 1

## 2022-10-28 MED ORDER — METHOCARBAMOL 500 MG PO TABS: 500.0000 mg | ORAL_TABLET | Freq: Four times a day (QID) | ORAL | Status: DC | PRN

## 2022-10-28 MED ORDER — METHOCARBAMOL 1000 MG/10ML IJ SOLN: 500.0000 mg | Freq: Four times a day (QID) | INTRAVENOUS | Status: DC | PRN

## 2022-10-28 NOTE — ED Notes (Signed)
Patient transported to CT 

## 2022-10-28 NOTE — Progress Notes (Signed)
Patient arrived to unit in bed by transport accompanied by pt daughters. On room air, no respiratory issues noted. IV intact, in pain 10/10/ med given and made comfortable in bed. Call bell given.

## 2022-10-28 NOTE — Consult Note (Signed)
ORTHOPAEDIC CONSULTATION  REQUESTING PHYSICIAN: Sharyn Creamer, MD  Chief Complaint:   Right intertrochanteric femur fracture  History of Present Illness: Amber Wolfe is a 89 y.o. female presented to the emergency room after a trip and fall while walking today reporting that her right leg went out from underneath her.  She reports she hit her head but did not lose consciousness and does endorse a mild headache at this time.  The patient denies any pre-existing right hip pain prior to the fall.  She is normally supposed to be walking with a walker but per daughters she does not use it at all times.  At time of examination she is alert and oriented to the year the fact that she is at a hospital and that she is in [facility].  He denies any chest pain, fevers, chills, changes in vision, numbness or tingling to the lower extremities.  Past Medical History:  Diagnosis Date   Arthritis    In hands   Atrial flutter (HCC)    a. s/p successful TEE/DCCV 07/31/2015; b. 01/2016 Amio added for Afib; c. 11/2017 Amio d/c'd 2/2 bradycardia; d. 12/2018 Back in aflutter-->Zio: Avg HR 74 (41-176) 100% Afl->bb added back; e. CHADS2VASc => 6 (CHF, HTN, age x 2, DM, female)-->eliquis 2.5 bid.   Bradycardia    a. 11/2017 noted during hospitalization @ [facility]-->amio d/c'd by cardiology.   Cataract    Chronic combined systolic (congestive) and diastolic (congestive) heart failure (HCC)    a. 06/2015 Echo: EF 40%, basal HK, nl WM of mid and apical segments, mild to mod MR/TR; b. TEE 07/31/2015: nl LV systolic function, mild to mod MR, trivial TR; c. 06/2017 Echo: EF 55-60%, mild LVH, Gr2 DD, triv AI, mild MR, mod dil LA, mildly dil RA, mild to mod TR. PASP 35-100mmHg; d. 01/2019 Echo: Ef 55-60%, mod dil LA mildly dil RA. Mild TR. Trace MR.   Diabetes mellitus without complication (HCC)    Type II   GERD (gastroesophageal reflux disease)    Glaucoma    Right Eye    Hematoma of abdominal wall    a. 11/2017 Fall-->R flank hematoma w/ anemia req PRBC. Xarelto dc'd at that time.   History of SCC (squamous cell carcinoma) of skin 08/20/2020   left forearm EDC   Hyperlipidemia    Hypertension    Moderate mitral regurgitation    a. see echo from 06/2015 and TEE 07/2015; b. 06/2017 Echo: Mild MR.   PAF (paroxysmal atrial fibrillation) (HCC)    a.  01/2016 Admitted to [facility] with PAF -->amio added (later dc'd 11/2017 2/2 bradycardia).   Right renal mass    a. 11/2017 CT Abd [facility]): 3.4cm R upper pole renal mass concerning for renal cell carcinoma-->outpt f/u w/ urology.   Past Surgical History:  Procedure Laterality Date   BREAST BIOPSY Right 01/14/2021   [place] bx 10:00 coil path pending   CATARACT EXTRACTION Bilateral    CESAREAN SECTION     ELECTROPHYSIOLOGIC STUDY N/A 07/31/2015   Procedure: CARDIOVERSION;  Surgeon: Almond Lint, MD;  Location: [facility] ORS;  Service: Cardiovascular;  Laterality: N/A;   ELECTROPHYSIOLOGIC STUDY N/A 04/03/2016   Procedure: CARDIOVERSION;  Surgeon: Antonieta Iba, MD;  Location: [facility] ORS;  Service: Cardiovascular;  Laterality: N/A;   TEE WITHOUT CARDIOVERSION N/A 07/31/2015   Procedure: TRANSESOPHAGEAL ECHOCARDIOGRAM (TEE);  Surgeon: Almond Lint, MD;  Location: [facility] ORS;  Service: Cardiovascular;  Laterality: N/A;   WISDOM TOOTH EXTRACTION     Wrist Surgery  Left    Social History   Socioeconomic History   Marital status: Widowed    Spouse name: Not on file   Number of children: 1   Years of education: Not on file   Highest education level: 12th grade  Occupational History   Occupation: Retired  Tobacco Use   Smoking status: Never   Smokeless tobacco: Never   Tobacco comments:    smoking cessation materials not required  Vaping Use   Vaping status: Never Used  Substance and Sexual Activity   Alcohol use: No   Drug use: No   Sexual activity: Not Currently  Other Topics Concern   Not on file  Social History  Narrative   Not on file   Social Determinants of Health   Financial Resource Strain: Low Risk  (11/02/2017)   Overall Financial Resource Strain (CARDIA)    Difficulty of Paying Living Expenses: Not hard at all  Food Insecurity: No Food Insecurity (11/02/2017)   Hunger Vital Sign    Worried About Running Out of Food in the Last Year: Never true    Ran Out of Food in the Last Year: Never true  Transportation Needs: No Transportation Needs (11/02/2017)   PRAPARE - Administrator, Civil Service (Medical): No    Lack of Transportation (Non-Medical): No  Physical Activity: Inactive (11/02/2017)   Exercise Vital Sign    Days of Exercise per Week: 0 days    Minutes of Exercise per Session: 0 min  Stress: Stress Concern Present (11/02/2017)   [employer] of Occupational Health - Occupational Stress Questionnaire    Feeling of Stress : To some extent  Social Connections: Unknown (11/02/2017)   Social Connection and Isolation Panel [NHANES]    Frequency of Communication with Friends and Family: Patient declined    Frequency of Social Gatherings with Friends and Family: Patient declined    Attends Religious Services: Patient declined    Database administrator or Organizations: Patient declined    Attends Banker Meetings: Patient declined    Marital Status: Widowed   Family History  Problem Relation Age of Onset   Cataracts Mother    Heart attack Mother    Heart attack Father    Aneurysm Daughter    Heart attack Maternal Grandfather    Heart attack Paternal Grandfather    Heart attack Brother    Breast cancer Neg Hx    No Known Allergies Prior to Admission medications   Medication Sig Start Date End Date Taking? Authorizing Provider  alendronate (FOSAMAX) 70 MG tablet Take 1 tablet (70 mg total) by mouth once a week. Take with a full glass of water on an empty stomach. 10/15/22  Yes Jeralyn Ruths, MD  amLODipine (NORVASC) 10 MG tablet Take 1 tablet (10 mg  total) by mouth daily. 12/22/19  Yes Iran Ouch, MD  apixaban (ELIQUIS) 5 MG TABS tablet Take 1 tablet (5 mg total) by mouth 2 (two) times daily. 05/04/22  Yes Iran Ouch, MD  ascorbic acid (VITAMIN C) 1000 MG tablet Take 1,000 mg by mouth daily.   Yes [provider]  atorvastatin (LIPITOR) 20 MG tablet Take 1 tablet (20 mg total) by mouth at bedtime. Office visit needed for additional refill 08/10/22  Yes Iran Ouch, MD  b complex vitamins capsule Take 1 capsule by mouth daily.   Yes [provider]  Blood Pressure KIT Check blood pressure daily or as needed if tired or  fatigued; dx I10 12/17/16  Yes Lada, Janit Bern, MD  furosemide (LASIX) 20 MG tablet Take 1 tablet (20 mg total) by mouth daily. 11/03/18  Yes Iran Ouch, MD  insulin glargine (LANTUS) 100 UNIT/ML injection Inject 12 Units into the skin at bedtime.   Yes [provider]  letrozole (FEMARA) 2.5 MG tablet Take 1 tablet (2.5 mg total) by mouth daily. Patient taking differently: Take 2.5 mg by mouth every evening. 01/15/22  Yes Jeralyn Ruths, MD  lisinopril (ZESTRIL) 40 MG tablet Take 1 tablet (40 mg total) by mouth daily. 01/25/20  Yes Iran Ouch, MD  metoprolol tartrate (LOPRESSOR) 25 MG tablet TAKE (1/2) TABLET TWICE DAILY. 04/17/22  Yes Iran Ouch, MD  Vitamin D, Ergocalciferol, (DRISDOL) 1.25 MG (50000 UNIT) CAPS capsule Take 50,000 Units by mouth once a week.   Yes [provider]   CT Head Wo Contrast  Result Date: 10/28/2022 CLINICAL DATA:  Head trauma, intracranial venous injury suspected; Facial trauma, blunt EXAM: CT HEAD WITHOUT CONTRAST CT CERVICAL SPINE WITHOUT CONTRAST TECHNIQUE: Multidetector CT imaging of the head and cervical spine was performed following the standard protocol without intravenous contrast. Multiplanar CT image reconstructions of the cervical spine were also generated. RADIATION DOSE REDUCTION: This exam was performed according  to the departmental dose-optimization program which includes automated exposure control, adjustment of the mA and/or kV according to patient size and/or use of iterative reconstruction technique. COMPARISON:  CT Head and C Spine 12/06/17 FINDINGS: CT HEAD FINDINGS Brain: No evidence of acute infarction, hemorrhage, hydrocephalus, extra-axial collection or mass lesion/mass effect. Sequela of moderate chronic microvascular ischemic change. 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 Vascular: No hyperdense vessel or unexpected calcification. Skull: Normal. Negative for fracture or focal lesion. Sinuses/Orbits: No middle ear or mastoid effusion. Paranasal sinuses are clear. Bilateral lens replacement. Orbits are otherwise unremarkable. Other: None. CT CERVICAL SPINE FINDINGS Alignment: Grade 1 anterolisthesis of C3 on C4 and C7 on T1. Skull base and vertebrae: No acute fracture. No primary bone lesion or focal pathologic process. Soft tissues and spinal canal: No prevertebral fluid or swelling. No visible canal hematoma. Disc levels:  No evidence of high-grade spinal canal stenosis. Upper chest: Bilateral subpleural reticulations can be seen in the setting of chronic interstitial lung disease. Other: Calcified atherosclerotic plaque of the carotid bifurcations bilaterally. IMPRESSION: 1. No CT evidence of intracranial injury. 2. No acute fracture or traumatic malalignment of the cervical spine. Electronically Signed   By: Lorenza Cambridge M.D.   On: 10/28/2022 17:41   CT Cervical Spine Wo Contrast  Result Date: 10/28/2022 CLINICAL DATA:  Head trauma, intracranial venous injury suspected; Facial trauma, blunt EXAM: CT HEAD WITHOUT CONTRAST CT CERVICAL SPINE WITHOUT CONTRAST TECHNIQUE: Multidetector CT imaging of the head and cervical spine was performed following the standard protocol without intravenous contrast. Multiplanar CT image reconstructions of the cervical spine were also generated. RADIATION DOSE REDUCTION:  This exam was performed according to the departmental dose-optimization program which includes automated exposure control, adjustment of the mA and/or kV according to patient size and/or use of iterative reconstruction technique. COMPARISON:  CT Head and C Spine 12/06/17 FINDINGS: CT HEAD FINDINGS Brain: No evidence of acute infarction, hemorrhage, hydrocephalus, extra-axial collection or mass lesion/mass effect. Sequela of moderate chronic microvascular ischemic change. 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 Vascular: No hyperdense vessel or unexpected calcification. Skull: Normal. Negative for fracture or focal lesion. Sinuses/Orbits: No middle ear or mastoid effusion. Paranasal sinuses are clear.  Bilateral lens replacement. Orbits are otherwise unremarkable. Other: None. CT CERVICAL SPINE FINDINGS Alignment: Grade 1 anterolisthesis of C3 on C4 and C7 on T1. Skull base and vertebrae: No acute fracture. No primary bone lesion or focal pathologic process. Soft tissues and spinal canal: No prevertebral fluid or swelling. No visible canal hematoma. Disc levels:  No evidence of high-grade spinal canal stenosis. Upper chest: Bilateral subpleural reticulations can be seen in the setting of chronic interstitial lung disease. Other: Calcified atherosclerotic plaque of the carotid bifurcations bilaterally. IMPRESSION: 1. No CT evidence of intracranial injury. 2. No acute fracture or traumatic malalignment of the cervical spine. Electronically Signed   By: Lorenza Cambridge M.D.   On: 10/28/2022 17:41   DG Chest 1 View  Result Date: 10/28/2022 CLINICAL DATA:  Right hip deformity.  Trauma EXAM: CHEST  1 VIEW COMPARISON:  X-ray 12/06/2017 FINDINGS: Calcified aorta. Film is rotated to the left. No consolidation, pneumothorax or effusion. No edema. Slight thickening along the minor fissure. Questionable nodule left midlung overlying the posterior aspect of the left seventh rib. Degenerative changes of the spine.  IMPRESSION: Small nodular density along the left midthorax. This could be summation of shadow. Recommend standard two-view x-ray to further delineate or CT when appropriate Electronically Signed   By: Karen Kays M.D.   On: 10/28/2022 17:35   DG Hip Unilat W or Wo Pelvis 2-3 Views Right  Result Date: 10/28/2022 CLINICAL DATA:  Pain after fall EXAM: DG HIP (WITH OR WITHOUT PELVIS) 3V RIGHT COMPARISON:  None Available. FINDINGS: Comminuted foreshortened and angulated intertrochanteric right hip fracture. No additional fracture or dislocation. Preserved joint spaces. Osteopenia. Note is made of prominent colonic stool. IMPRESSION: Comminuted foreshortened and angulated intertrochanteric right hip fracture. Electronically Signed   By: Karen Kays M.D.   On: 10/28/2022 17:33    Positive ROS: All other systems have been reviewed and were otherwise negative with the exception of those mentioned in the HPI and as above.  Physical Exam: General:  Alert, no acute distress Psychiatric:  Patient is competent for consent with normal mood and affect   Cardiovascular:  No pedal edema Respiratory:  No wheezing, non-labored breathing GI:  Abdomen is soft and non-tender Skin:  No lesions in the area of chief complaint Neurologic:  Sensation intact distally Lymphatic:  No axillary or cervical lymphadenopathy  Orthopedic Exam:  Right lower extremity Skin intact with tenderness over the lateral hip mild swelling Shortened and externally rotated Sensation intact neurovascular intact distally with no tenderness to palpation over the knee tibia, foot or ankle. Compartments all soft Able to dorsiflex and plantarflex toes and foot without pain  Secondary survey No tenderness to palpation over other bony prominences in the lower extremities or bilateral upper extremities No pain with logroll or simulated axial loading of the left lower extremity All compartments soft No tenderness to palpation over the cervical  or thoracic spine, no bony step-off Motor grossly intact throughout, no focal deficits Sensation grossly intact throughout, no focal deficits Good distal pulses and capillary refill on all extremities   X-rays:  AP pelvis and right hip x-rays reviewed which show a comminuted intertrochanteric fracture of the right proximal femur.  No subtrochanteric extension noted.  Agree with radiologist interpretation.  Assessment: Right intertrochanteric femur fracture  Plan: Amber Wolfe is an 89 year old female who presents with a right intertrochanteric fracture.  An extensive conversation with the patient and her daughters about nonsurgical and surgical interventions for hip fracture under shared decision making model  they have elected to proceed with surgical intervention for her right hip fracture.  The patient will need a intramedullary nail.  A long discussion took place with the patient describing what a intramedullary nail is and what the procedure would entail. The xrays were reviewed with the patient and the implants were discussed. The ability to secure the implant utilizing screws or cementless (press fit) fixation was discussed. Surgical exposures were discussed with the patient.    The hospitalization and post-operative care and rehabilitation were also discussed. The use of perioperative antibiotics and DVT prophylaxis were discussed. The risk, benefits and alternatives to a surgical intervention were discussed at length with the patient. The patient was also advised of risks related to the medical comorbidities. A lengthy discussion took place to review the most common complications including but not limited to: deep vein thrombosis, pulmonary embolus, heart attack, stroke, infection, wound breakdown, dislocation, numbness, leg length in-equality, damage to nerves, intraoperative fracture, malunion/ nonunion, tendon,muscles, arteries or other blood vessels, death and other possible complications from  anesthesia. The patient was told that we will take steps to minimize these risks by using sterile technique, antibiotics and DVT prophylaxis when appropriate and follow the patient postoperatively in the office setting to monitor progress. The possibility of recurrent pain, no improvement in pain and actual worsening of pain were also discussed with the patient.      The benefits of surgery were discussed with the patient and family including the potential for improving the patient's current clinical condition through operative intervention. Alternatives to surgical intervention including conservative management were also discussed in detail. All questions were answered to the satisfaction of the patient. The patient and family participated and agreed to the plan of care as well as the use of the recommended implants for their surgery.    Plan for surgery tomorrow 10/29/2022 N.p.o. after midnight for the operating room Hold anticoagulation after midnight Consent to be filled out in the presurgical holding area with the patient and her daughters.   Reinaldo Berber MD  Beeper #:  [phone]  10/28/2022 7:39 PM

## 2022-10-28 NOTE — ED Triage Notes (Signed)
Pt arrives from home via [facility] w/ c/o fall today, tripped while walking. Pt did hit head, no LOC, does take eliquis. There is shortening and rotation noted to R leg, pedal pulses present as reported by EMS. No other obvious deformities. AO4/GCS15  Given fent, 4mg  zofran 272 cbg 138/98 97% ra 84 afib  Hx DM

## 2022-10-28 NOTE — Plan of Care (Signed)
  Problem: Education: Goal: Knowledge of General Education information will improve Description Including pain rating scale, medication(s)/side effects and non-pharmacologic comfort measures Outcome: Progressing   Problem: Clinical Measurements: Goal: Ability to maintain clinical measurements within normal limits will improve Outcome: Progressing   Problem: Activity: Goal: Risk for activity intolerance will decrease Outcome: Progressing   Problem: Nutrition: Goal: Adequate nutrition will be maintained Outcome: Progressing   Problem: Coping: Goal: Level of anxiety will decrease Outcome: Progressing   Problem: Pain Managment: Goal: General experience of comfort will improve Outcome: Progressing   Problem: Safety: Goal: Ability to remain free from injury will improve Outcome: Progressing   

## 2022-10-28 NOTE — H&P (Signed)
History and Physical    Patient: Amber Wolfe GNF:[id number] DOB: November 16, 1931 DOA: 10/28/2022 DOS: the patient was seen and examined on 10/29/2022 PCP: Armando Gang, FNP  Patient coming from: Home   Chief Complaint:  Chief Complaint  Patient presents with   Fall    HPI: Amber Wolfe is a 89 y.o. female with medical history significant for atrial fibrillation atrial flutter on Eliquis twice daily 2.5, diabetes mellitus type 2, hypertension, glaucoma, GERD brought to the hospital by EMS for fall while walking no loss of consciousness or patient did hit her head.  Reported patient noted to have shortening and rotation of the right leg pedal pulses reported to be present via EMS gross deformities cranial nerves grossly intact patient alert and awake answering questions vitals are stable afebrile O2 sats of 97% heart rate of 84 with A-fib. Patient does not note any dizziness or chest pain or does not report any palpitations or any symptoms states that she just turned around and her leg gave out. Daughter is at bedside./ discussed care plan with them.  Patient was shaking and having chills during my exam and stat cultures and lactic acid were collected.  Mild leukocytosis present. Other blood work showed hypokalemia of 3.4 glucose 243 normal kidney function and LFTs. Initial lactic of 2.6 and repeat of 1.1. WBC count of 11.9 normal hemoglobin and platelet count. Urinalysis requested and is still pending. CTA chest done due to abnormal chest x-ray and patient's history of breast cancer which was negative for any acute PE or dissection but progression of fibrotic changes in the lung. Patient also reported falling and hitting her head although no loss of consciousness and CT scan done was negative for any traumatic malalignment fracture and negative for any intracranial injury.  Review of Systems: Review of Systems  Unable to perform ROS: Age  Musculoskeletal:  Positive for joint pain.   Neurological:  Negative for dizziness and weakness.   Past Medical History:  Diagnosis Date   Arthritis    In hands   Atrial flutter (HCC)    a. s/p successful TEE/DCCV 07/31/2015; b. 01/2016 Amio added for Afib; c. 11/2017 Amio d/c'd 2/2 bradycardia; d. 12/2018 Back in aflutter-->Zio: Avg HR 74 (41-176) 100% Afl->bb added back; e. CHADS2VASc => 6 (CHF, HTN, age x 2, DM, female)-->eliquis 2.5 bid.   Bradycardia    a. 11/2017 noted during hospitalization @ [facility]-->amio d/c'd by cardiology.   Cataract    Chronic combined systolic (congestive) and diastolic (congestive) heart failure (HCC)    a. 06/2015 Echo: EF 40%, basal HK, nl WM of mid and apical segments, mild to mod MR/TR; b. TEE 07/31/2015: nl LV systolic function, mild to mod MR, trivial TR; c. 06/2017 Echo: EF 55-60%, mild LVH, Gr2 DD, triv AI, mild MR, mod dil LA, mildly dil RA, mild to mod TR. PASP 35-88mmHg; d. 01/2019 Echo: Ef 55-60%, mod dil LA mildly dil RA. Mild TR. Trace MR.   Diabetes mellitus without complication (HCC)    Type II   GERD (gastroesophageal reflux disease)    Glaucoma    Right Eye   Hematoma of abdominal wall    a. 11/2017 Fall-->R flank hematoma w/ anemia req PRBC. Xarelto dc'd at that time.   History of SCC (squamous cell carcinoma) of skin 08/20/2020   left forearm EDC   Hyperlipidemia    Hypertension    Moderate mitral regurgitation    a. see echo from 06/2015 and TEE 07/2015; b. 06/2017  Echo: Mild MR.   PAF (paroxysmal atrial fibrillation) (HCC)    a.  01/2016 Admitted to [facility] with PAF -->amio added (later dc'd 11/2017 2/2 bradycardia).   Right renal mass    a. 11/2017 CT Abd [facility]): 3.4cm R upper pole renal mass concerning for renal cell carcinoma-->outpt f/u w/ urology.   Past Surgical History:  Procedure Laterality Date   BREAST BIOPSY Right 01/14/2021   [place] bx 10:00 coil path pending   CATARACT EXTRACTION Bilateral    CESAREAN SECTION     ELECTROPHYSIOLOGIC STUDY N/A 07/31/2015   Procedure: CARDIOVERSION;   Surgeon: Almond Lint, MD;  Location: [facility] ORS;  Service: Cardiovascular;  Laterality: N/A;   ELECTROPHYSIOLOGIC STUDY N/A 04/03/2016   Procedure: CARDIOVERSION;  Surgeon: Antonieta Iba, MD;  Location: [facility] ORS;  Service: Cardiovascular;  Laterality: N/A;   TEE WITHOUT CARDIOVERSION N/A 07/31/2015   Procedure: TRANSESOPHAGEAL ECHOCARDIOGRAM (TEE);  Surgeon: Almond Lint, MD;  Location: [facility] ORS;  Service: Cardiovascular;  Laterality: N/A;   WISDOM TOOTH EXTRACTION     Wrist Surgery Left    Social History:  reports that she has never smoked. She has never used smokeless tobacco. She reports that she does not drink alcohol and does not use drugs.  No Known Allergies  Family History  Problem Relation Age of Onset   Cataracts Mother    Heart attack Mother    Heart attack Father    Aneurysm Daughter    Heart attack Maternal Grandfather    Heart attack Paternal Grandfather    Heart attack Brother    Breast cancer Neg Hx     Prior to Admission medications   Medication Sig Start Date End Date Taking? Authorizing Provider  alendronate (FOSAMAX) 70 MG tablet Take 1 tablet (70 mg total) by mouth once a week. Take with a full glass of water on an empty stomach. 10/15/22   Jeralyn Ruths, MD  amLODipine (NORVASC) 10 MG tablet Take 1 tablet (10 mg total) by mouth daily. 12/22/19   Iran Ouch, MD  apixaban (ELIQUIS) 5 MG TABS tablet Take 1 tablet (5 mg total) by mouth 2 (two) times daily. 05/04/22   Iran Ouch, MD  atorvastatin (LIPITOR) 20 MG tablet Take 1 tablet (20 mg total) by mouth at bedtime. Office visit needed for additional refill 08/10/22   Iran Ouch, MD  Blood Pressure KIT Check blood pressure daily or as needed if tired or fatigued; dx I10 12/17/16   Lada, Janit Bern, MD  Cholecalciferol (VITAMIN D3) 1.25 MG (50000 UT) CAPS Take 1 capsule by mouth once a week. 07/20/18   [provider]  furosemide (LASIX) 20 MG tablet Take 1 tablet (20 mg total) by mouth  daily. 11/03/18   Iran Ouch, MD  insulin glargine (LANTUS) 100 UNIT/ML injection Inject 12 Units into the skin Nightly.    [provider]  letrozole (FEMARA) 2.5 MG tablet Take 1 tablet (2.5 mg total) by mouth daily. 01/15/22   Jeralyn Ruths, MD  lisinopril (ZESTRIL) 40 MG tablet Take 1 tablet (40 mg total) by mouth daily. 01/25/20   Iran Ouch, MD  metoprolol tartrate (LOPRESSOR) 25 MG tablet TAKE (1/2) TABLET TWICE DAILY. 04/17/22   Iran Ouch, MD  Vitamin D, Ergocalciferol, (DRISDOL) 1.25 MG (50000 UNIT) CAPS capsule Take 50,000 Units by mouth once a week. 03/11/21   [provider]     Vitals:   10/28/22 1800 10/28/22 1830 10/28/22 1900 10/28/22 2049  BP:  133/68 (!) 116/57 (!) 141/101 (!) 131/59  Pulse: 82 64 73 85  Resp: 15 18 (!) 24 18  Temp:    98.4 F (36.9 C)  TempSrc:    Oral  SpO2: 100% 100% 99% 100%  Weight:      Height:       Physical Exam Vitals and nursing note reviewed.  Constitutional:      General: She is not in acute distress. HENT:     Head: Normocephalic and atraumatic.     Right Ear: Hearing normal.     Left Ear: Hearing normal.     Nose: Nose normal. No nasal deformity.     Mouth/Throat:     Lips: Pink.     Tongue: No lesions.     Pharynx: Oropharynx is clear.  Eyes:     General: Lids are normal.     Extraocular Movements: Extraocular movements intact.  Cardiovascular:     Rate and Rhythm: Normal rate and regular rhythm.     Heart sounds: Normal heart sounds.  Pulmonary:     Effort: Pulmonary effort is normal.     Breath sounds: Normal breath sounds.  Abdominal:     General: Bowel sounds are normal. There is no distension.     Palpations: Abdomen is soft. There is no mass.     Tenderness: There is no abdominal tenderness.  Musculoskeletal:        General: Deformity present.     Right hip: Tenderness present.     Right lower leg: No edema.     Left lower leg: No edema.  Skin:    General: Skin is  warm.  Neurological:     General: No focal deficit present.     Mental Status: She is alert and oriented to person, place, and time.     Cranial Nerves: Cranial nerves 2-12 are intact.  Psychiatric:        Attention and Perception: Attention normal.        Mood and Affect: Mood normal.        Speech: Speech normal.        Behavior: Behavior normal. Behavior is cooperative.     Labs on Admission: I have personally reviewed following labs and imaging studies Shows potassium of 3.4 normal kidney function EGFR glucose 243 LFTs - added on. Leukocytosis of 11.9 otherwise normal he will and platelet count. PT of 17.5 INR of 1.4. Patient had a CT cervical spine which was negative for any malalignment or fracture no CT evidence of intracranial injury. Hip x-ray done in the emergency room shows comminuted foreshortened angulated intertrochanteric right hip fracture. Last echo on 7/ 2022 shows:  1. Left ventricular ejection fraction, by estimation, is 55 to 60%. The  left ventricle has normal function. The left ventricle has no regional  wall motion abnormalities. Left ventricular diastolic parameters are  indeterminate.   2. Right ventricular systolic function is normal. The right ventricular  size is normal.   3. Left atrial size was mild to moderately dilated.   4. The mitral valve is normal in structure. Trivial mitral valve  regurgitation.   5. The aortic valve is tricuspid. Aortic valve regurgitation is not  visualized.  CBC: Recent Labs  Lab 10/28/22 1641  WBC 11.9*  HGB 12.8  HCT 37.4  MCV 94.9  PLT 178   Basic Metabolic Panel: Recent Labs  Lab 10/28/22 1641  NA 136  K 3.4*  CL 106  CO2 22  GLUCOSE 243*  BUN 20  CREATININE 0.97  CALCIUM 8.8*   GFR: Estimated Creatinine Clearance: 35.4 mL/min (by C-G formula based on SCr of 0.97 mg/dL). Liver Function Tests: Recent Labs  Lab 10/28/22 1641  AST 21  ALT 12  ALKPHOS 46  BILITOT 0.9  PROT 6.4*  ALBUMIN 3.8    No results for input(s): "LIPASE", "AMYLASE" in the last 168 hours. No results for input(s): "AMMONIA" in the last 168 hours. Coagulation Profile: Recent Labs  Lab 10/28/22 1641  INR 1.4*    Unresulted Labs (From admission, onward)     Start     Ordered   10/29/22 0204  Hemoglobin A1c  Once,   R       Comments: To assess prior glycemic control    10/29/22 0204   10/28/22 2347  Urine Culture (for pregnant, neutropenic or urologic patients or patients with an indwelling urinary catheter)  (Urine Labs)  Once,   R       Question:  Indication  Answer:  Sepsis   10/28/22 2346   10/28/22 2002  Culture, blood (Routine X 2) w Reflex to ID Panel  BLOOD CULTURE X 2,   TIMED      10/28/22 2002   10/28/22 2002  Urinalysis, Complete w Microscopic -Urine, Random  Add-on,   AD       Question:  Specimen Source  Answer:  Urine, Random   10/28/22 2002           Radiological Exams on Admission: CT Angio Chest Pulmonary Embolism (PE) W or WO Contrast  Result Date: 10/28/2022 CLINICAL DATA:  Fall EXAM: CT ANGIOGRAPHY CHEST WITH CONTRAST TECHNIQUE: Multidetector CT imaging of the chest was performed using the standard protocol during bolus administration of intravenous contrast. Multiplanar CT image reconstructions and MIPs were obtained to evaluate the vascular anatomy. RADIATION DOSE REDUCTION: This exam was performed according to the departmental dose-optimization program which includes automated exposure control, adjustment of the mA and/or kV according to patient size and/or use of iterative reconstruction technique. CONTRAST:  75mL OMNIPAQUE IOHEXOL 350 MG/ML SOLN COMPARISON:  Chest x-ray 04/29/2022, chest CT 12/06/2017 FINDINGS: Cardiovascular: Satisfactory opacification of the pulmonary arteries to the segmental level. No evidence of pulmonary embolism. Moderate aortic atherosclerosis. No aneurysm. Coronary vascular calcification. Borderline cardiac enlargement. No pericardial effusion  Mediastinum/Nodes: Midline trachea. No thyroid mass. No suspicious lymph nodes. Small moderate hiatal hernia Lungs/Pleura: Progression of reticular disease at the apices consistent with fibrosis. Diffuse bilateral mostly subpleural reticular change slightly progressed. No acute airspace disease, pleural effusion or pneumothorax Upper Abdomen: No acute finding Musculoskeletal: No acute or suspicious osseous abnormality. Review of the MIP images confirms the above findings. IMPRESSION: 1. Negative for acute pulmonary embolus or aortic dissection. 2. Progression of fibrotic changes in the lungs. 3. Aortic atherosclerosis. Aortic Atherosclerosis (ICD10-I70.0). Electronically Signed   By: Jasmine Pang M.D.   On: 10/28/2022 21:52   DG FEMUR, MIN 2 VIEWS RIGHT  Result Date: 10/28/2022 CLINICAL DATA:  Fall with right hip fracture EXAM: RIGHT FEMUR 2 VIEWS COMPARISON:  10/28/2022 FINDINGS: Only the mid to distal femur is included. There is no fracture or malalignment. Moderate severe tricompartment arthritis of the knee, worst involving the medial joint space. Vascular calcifications IMPRESSION: No acute osseous abnormality. Electronically Signed   By: Jasmine Pang M.D.   On: 10/28/2022 21:32   CT Head Wo Contrast  Result Date: 10/28/2022 CLINICAL DATA:  Head trauma, intracranial venous injury suspected; Facial trauma, blunt EXAM: CT HEAD WITHOUT CONTRAST  CT CERVICAL SPINE WITHOUT CONTRAST TECHNIQUE: Multidetector CT imaging of the head and cervical spine was performed following the standard protocol without intravenous contrast. Multiplanar CT image reconstructions of the cervical spine were also generated. RADIATION DOSE REDUCTION: This exam was performed according to the departmental dose-optimization program which includes automated exposure control, adjustment of the mA and/or kV according to patient size and/or use of iterative reconstruction technique. COMPARISON:  CT Head and C Spine 12/06/17 FINDINGS: CT  HEAD FINDINGS Brain: No evidence of acute infarction, hemorrhage, hydrocephalus, extra-axial collection or mass lesion/mass effect. Sequela of moderate chronic microvascular ischemic change. 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 Vascular: No hyperdense vessel or unexpected calcification. Skull: Normal. Negative for fracture or focal lesion. Sinuses/Orbits: No middle ear or mastoid effusion. Paranasal sinuses are clear. Bilateral lens replacement. Orbits are otherwise unremarkable. Other: None. CT CERVICAL SPINE FINDINGS Alignment: Grade 1 anterolisthesis of C3 on C4 and C7 on T1. Skull base and vertebrae: No acute fracture. No primary bone lesion or focal pathologic process. Soft tissues and spinal canal: No prevertebral fluid or swelling. No visible canal hematoma. Disc levels:  No evidence of high-grade spinal canal stenosis. Upper chest: Bilateral subpleural reticulations can be seen in the setting of chronic interstitial lung disease. Other: Calcified atherosclerotic plaque of the carotid bifurcations bilaterally. IMPRESSION: 1. No CT evidence of intracranial injury. 2. No acute fracture or traumatic malalignment of the cervical spine. Electronically Signed   By: Lorenza Cambridge M.D.   On: 10/28/2022 17:41   CT Cervical Spine Wo Contrast  Result Date: 10/28/2022 CLINICAL DATA:  Head trauma, intracranial venous injury suspected; Facial trauma, blunt EXAM: CT HEAD WITHOUT CONTRAST CT CERVICAL SPINE WITHOUT CONTRAST TECHNIQUE: Multidetector CT imaging of the head and cervical spine was performed following the standard protocol without intravenous contrast. Multiplanar CT image reconstructions of the cervical spine were also generated. RADIATION DOSE REDUCTION: This exam was performed according to the departmental dose-optimization program which includes automated exposure control, adjustment of the mA and/or kV according to patient size and/or use of iterative reconstruction technique. COMPARISON:  CT  Head and C Spine 12/06/17 FINDINGS: CT HEAD FINDINGS Brain: No evidence of acute infarction, hemorrhage, hydrocephalus, extra-axial collection or mass lesion/mass effect. Sequela of moderate chronic microvascular ischemic change. 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 Vascular: No hyperdense vessel or unexpected calcification. Skull: Normal. Negative for fracture or focal lesion. Sinuses/Orbits: No middle ear or mastoid effusion. Paranasal sinuses are clear. Bilateral lens replacement. Orbits are otherwise unremarkable. Other: None. CT CERVICAL SPINE FINDINGS Alignment: Grade 1 anterolisthesis of C3 on C4 and C7 on T1. Skull base and vertebrae: No acute fracture. No primary bone lesion or focal pathologic process. Soft tissues and spinal canal: No prevertebral fluid or swelling. No visible canal hematoma. Disc levels:  No evidence of high-grade spinal canal stenosis. Upper chest: Bilateral subpleural reticulations can be seen in the setting of chronic interstitial lung disease. Other: Calcified atherosclerotic plaque of the carotid bifurcations bilaterally. IMPRESSION: 1. No CT evidence of intracranial injury. 2. No acute fracture or traumatic malalignment of the cervical spine. Electronically Signed   By: Lorenza Cambridge M.D.   On: 10/28/2022 17:41   DG Chest 1 View  Result Date: 10/28/2022 CLINICAL DATA:  Right hip deformity.  Trauma EXAM: CHEST  1 VIEW COMPARISON:  X-ray 12/06/2017 FINDINGS: Calcified aorta. Film is rotated to the left. No consolidation, pneumothorax or effusion. No edema. Slight thickening along the minor fissure. Questionable nodule left midlung overlying the posterior  aspect of the left seventh rib. Degenerative changes of the spine. IMPRESSION: Small nodular density along the left midthorax. This could be summation of shadow. Recommend standard two-view x-ray to further delineate or CT when appropriate Electronically Signed   By: Karen Kays M.D.   On: 10/28/2022 17:35   DG Hip  Unilat W or Wo Pelvis 2-3 Views Right  Result Date: 10/28/2022 CLINICAL DATA:  Pain after fall EXAM: DG HIP (WITH OR WITHOUT PELVIS) 3V RIGHT COMPARISON:  None Available. FINDINGS: Comminuted foreshortened and angulated intertrochanteric right hip fracture. No additional fracture or dislocation. Preserved joint spaces. Osteopenia. Note is made of prominent colonic stool. IMPRESSION: Comminuted foreshortened and angulated intertrochanteric right hip fracture. Electronically Signed   By: Karen Kays M.D.   On: 10/28/2022 17:33     Data Reviewed: Relevant notes from primary care and specialist visits, past discharge summaries as available in EHR, including Care Everywhere. Prior diagnostic testing as pertinent to current admission diagnoses Updated medications and problem lists for reconciliation ED course, including vitals, labs, imaging, treatment and response to treatment Triage notes, nursing and pharmacy notes and ED provider's notes Notable results as noted in HPI.  Assessment and Plan: * Displaced intertrochanteric fracture of right femur, initial encounter for closed fracture (HCC) Pt reports rt hip pain from her fall. No reports of dizziness. Daughter at bedside. Pt lives alone.  Orthopedic consulted and eliquis held  tonight for rt hip intramedullary nail placement in am. Pain control PRN. PT and rehab per Ortho.    Hypokalemia Replace with IV kcl 20 meq x 1.    Falls Fall precaution esp at home upon discharge. Orthostatic vitals once able.  ? If falls are related to BP or volume issue or dysrhythmia we will admit with tele and monitor.     Chills (without fever) Pt was having chills and shaking in ed ,suspected underlying infection probably uti, we will get lactic , cultures and start empiric iv antibiotics once cultures are collected.  Rectal temperature.     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, right (HCC) Continue letrozole.    Atrial fibrillation  (HCC) Currently in a.fib, Eliquis held for tomorrows procedure.  We will resume 12-24 hours post procedure.  Continue metoprolol at 12.5 mg bid.    Essential hypertension, benign Vitals:   10/28/22 1638 10/28/22 1730 10/28/22 1800 10/28/22 1830  BP: 134/67 134/66 133/68 (!) 116/57   10/28/22 1900 10/28/22 2049  BP: (!) 141/101 (!) 131/59  Home medications,lisinopril , amlodipine, Lasix all held as blood pressure was normal. Suspect with pain medication blood pressure will continue to stay normal will resume individually medication at a time to prevent hypotension. Continue metoprolol.   Diabetes mellitus with neuropathy (HCC) Glycemic protocol and accuchecks q 4 hourly.  Pt Npo.   DVT prophylaxis:  Eliquis held for procedure.   Consults:  Orthopedic: Dr. Arty Baumgartner  Advance Care Planning:    Code Status: Full Code   Family Communication:  Daughter at bedside.   Disposition Plan:  STR  Severity of Illness: The appropriate patient status for this patient is INPATIENT. Inpatient status is judged to be reasonable and necessary in order to provide the required intensity of service to ensure the patient's safety. The patient's presenting symptoms, physical exam findings, and initial radiographic and laboratory data in the context of their chronic comorbidities is felt to place them at high risk for further clinical deterioration. Furthermore, it is not anticipated that the patient will be medically stable  for discharge from the hospital within 2 midnights of admission.   * I certify that at the point of admission it is my clinical judgment that the patient will require inpatient hospital care spanning beyond 2 midnights from the point of admission due to high intensity of service, high risk for further deterioration and high frequency of surveillance required.*  Author: Gertha Calkin, MD 10/29/2022 2:40 AM  For on call review [email or web address].

## 2022-10-29 ENCOUNTER — Inpatient Hospital Stay: Payer: Medicare Other

## 2022-10-29 ENCOUNTER — Other Ambulatory Visit: Payer: Self-pay

## 2022-10-29 ENCOUNTER — Encounter
Admission: EM | Disposition: A | Discharge status: Skilled Nursing Facility | Payer: Self-pay | Source: Home / Self Care | Attending: Internal Medicine

## 2022-10-29 ENCOUNTER — Encounter: Payer: Self-pay | Admitting: Internal Medicine

## 2022-10-29 ENCOUNTER — Inpatient Hospital Stay: Payer: Medicare Other | Admitting: Anesthesiology

## 2022-10-29 DIAGNOSIS — E876 Hypokalemia: Secondary | ICD-10-CM | POA: Diagnosis present

## 2022-10-29 DIAGNOSIS — W19XXXA Unspecified fall, initial encounter: Secondary | ICD-10-CM | POA: Diagnosis present

## 2022-10-29 DIAGNOSIS — R6883 Chills (without fever): Secondary | ICD-10-CM | POA: Diagnosis present

## 2022-10-29 DIAGNOSIS — S72141A Displaced intertrochanteric fracture of right femur, initial encounter for closed fracture: Secondary | ICD-10-CM | POA: Diagnosis not present

## 2022-10-29 HISTORY — PX: INTRAMEDULLARY (IM) NAIL INTERTROCHANTERIC: SHX5875

## 2022-10-29 LAB — GLUCOSE, CAPILLARY
Glucose-Capillary: 218 mg/dL — ABNORMAL HIGH (ref 70–99)
Glucose-Capillary: 134 mg/dL — ABNORMAL HIGH (ref 70–99)
Glucose-Capillary: 145 mg/dL — ABNORMAL HIGH (ref 70–99)
Glucose-Capillary: 176 mg/dL — ABNORMAL HIGH (ref 70–99)
Glucose-Capillary: 218 mg/dL — ABNORMAL HIGH (ref 70–99)

## 2022-10-29 LAB — CBC
HCT: 34.4 % — ABNORMAL LOW (ref 36.0–46.0)
Hemoglobin: 11.3 g/dL — ABNORMAL LOW (ref 12.0–15.0)
MCH: 32.2 pg (ref 26.0–34.0)
MCHC: 32.8 g/dL (ref 30.0–36.0)
MCV: 98 fL (ref 80.0–100.0)
Platelets: 171 10*3/uL (ref 150–400)
RBC: 3.51 MIL/uL — ABNORMAL LOW (ref 3.87–5.11)
RDW: 14.2 % (ref 11.5–15.5)
WBC: 9.9 10*3/uL (ref 4.0–10.5)
nRBC: 0 % (ref 0.0–0.2)

## 2022-10-29 LAB — HEMOGLOBIN A1C
Hgb A1c MFr Bld: 6.9 % — ABNORMAL HIGH (ref 4.8–5.6)
Mean Plasma Glucose: 151.33 mg/dL

## 2022-10-29 LAB — SURGICAL PCR SCREEN
MRSA, PCR: NEGATIVE
Staphylococcus aureus: NEGATIVE

## 2022-10-29 SURGERY — FIXATION, FRACTURE, INTERTROCHANTERIC, WITH INTRAMEDULLARY ROD
Anesthesia: General | Laterality: Right

## 2022-10-29 MED ORDER — DEXAMETHASONE SODIUM PHOSPHATE 10 MG/ML IJ SOLN
INTRAMUSCULAR | Status: AC
  Filled 2022-10-29: qty 1

## 2022-10-29 MED ORDER — ONDANSETRON HCL 4 MG/2ML IJ SOLN
INTRAMUSCULAR | Status: AC
  Filled 2022-10-29: qty 2

## 2022-10-29 MED ORDER — BUPIVACAINE-EPINEPHRINE (PF) 0.25% -1:200000 IJ SOLN
INTRAMUSCULAR | Status: DC | PRN
  Administered 2022-10-29: 30 mL via PERINEURAL

## 2022-10-29 MED ORDER — POTASSIUM CHLORIDE 10 MEQ/100ML IV SOLN
10.0000 meq | INTRAVENOUS | Status: AC
  Administered 2022-10-29 (×2): 10 meq via INTRAVENOUS
  Filled 2022-10-29: qty 100

## 2022-10-29 MED ORDER — ACETAMINOPHEN 10 MG/ML IV SOLN
INTRAVENOUS | Status: DC | PRN
  Administered 2022-10-29: 1000 mg via INTRAVENOUS

## 2022-10-29 MED ORDER — 0.9 % SODIUM CHLORIDE (POUR BTL) OPTIME
TOPICAL | Status: DC | PRN
  Administered 2022-10-29: 500 mL

## 2022-10-29 MED ORDER — TRANEXAMIC ACID-NACL 1000-0.7 MG/100ML-% IV SOLN
INTRAVENOUS | Status: DC | PRN
Start: 2022-10-29 — End: 2022-10-29
  Administered 2022-10-29: 1000 mg via INTRAVENOUS

## 2022-10-29 MED ORDER — SODIUM CHLORIDE 0.9 % IV SOLN
INTRAVENOUS | Status: DC | PRN
Start: 2022-10-29 — End: 2022-10-29

## 2022-10-29 MED ORDER — FENTANYL CITRATE (PF) 100 MCG/2ML IJ SOLN
INTRAMUSCULAR | Status: DC | PRN
  Administered 2022-10-29: 25 ug via INTRAVENOUS

## 2022-10-29 MED ORDER — ACETAMINOPHEN 325 MG PO TABS
325.0000 mg | ORAL_TABLET | Freq: Four times a day (QID) | ORAL | Status: DC | PRN
  Administered 2022-11-02: 325 mg via ORAL
  Administered 2022-11-03: 650 mg via ORAL
  Filled 2022-10-29: qty 2
  Filled 2022-10-29: qty 1

## 2022-10-29 MED ORDER — ACETAMINOPHEN 500 MG PO TABS
500.0000 mg | ORAL_TABLET | Freq: Four times a day (QID) | ORAL | Status: AC
  Administered 2022-10-29 – 2022-10-30 (×3): 500 mg via ORAL
  Filled 2022-10-29 (×4): qty 1

## 2022-10-29 MED ORDER — FENTANYL CITRATE (PF) 100 MCG/2ML IJ SOLN
INTRAMUSCULAR | Status: AC
  Filled 2022-10-29: qty 2

## 2022-10-29 MED ORDER — LETROZOLE 2.5 MG PO TABS
2.5000 mg | ORAL_TABLET | Freq: Every evening | ORAL | Status: DC
  Administered 2022-10-30 – 2022-11-02 (×4): 2.5 mg via ORAL
  Filled 2022-10-29 (×6): qty 1

## 2022-10-29 MED ORDER — SUGAMMADEX SODIUM 500 MG/5ML IV SOLN
INTRAVENOUS | Status: DC | PRN
  Administered 2022-10-29: 264 mg via INTRAVENOUS

## 2022-10-29 MED ORDER — FENTANYL CITRATE (PF) 100 MCG/2ML IJ SOLN
25.0000 ug | INTRAMUSCULAR | Status: AC | PRN
  Administered 2022-10-29 (×6): 25 ug via INTRAVENOUS

## 2022-10-29 MED ORDER — ADULT MULTIVITAMIN W/MINERALS CH
1.0000 | ORAL_TABLET | Freq: Every day | ORAL | Status: DC
  Administered 2022-10-30 – 2022-11-03 (×5): 1 via ORAL
  Filled 2022-10-29 (×5): qty 1

## 2022-10-29 MED ORDER — CEFAZOLIN SODIUM-DEXTROSE 2-3 GM-%(50ML) IV SOLR
INTRAVENOUS | Status: DC | PRN
  Administered 2022-10-29: 2 g via INTRAVENOUS

## 2022-10-29 MED ORDER — BUPIVACAINE-EPINEPHRINE (PF) 0.25% -1:200000 IJ SOLN
INTRAMUSCULAR | Status: AC
  Filled 2022-10-29: qty 30

## 2022-10-29 MED ORDER — HYDROCODONE-ACETAMINOPHEN 5-325 MG PO TABS
1.0000 | ORAL_TABLET | ORAL | Status: DC | PRN
  Administered 2022-10-31: 2 via ORAL
  Filled 2022-10-29: qty 2

## 2022-10-29 MED ORDER — METOCLOPRAMIDE HCL 5 MG/ML IJ SOLN
5.0000 mg | Freq: Three times a day (TID) | INTRAMUSCULAR | Status: DC | PRN
  Administered 2022-11-01: 10 mg via INTRAVENOUS
  Filled 2022-10-29 (×2): qty 2

## 2022-10-29 MED ORDER — DOCUSATE SODIUM 100 MG PO CAPS
100.0000 mg | ORAL_CAPSULE | Freq: Two times a day (BID) | ORAL | Status: DC
  Administered 2022-10-29 – 2022-11-03 (×10): 100 mg via ORAL
  Filled 2022-10-29 (×11): qty 1

## 2022-10-29 MED ORDER — INSULIN ASPART 100 UNIT/ML IJ SOLN
0.0000 [IU] | INTRAMUSCULAR | Status: DC | PRN
  Administered 2022-10-29: 3 [IU] via SUBCUTANEOUS
  Administered 2022-10-30 (×2): 5 [IU] via SUBCUTANEOUS
  Filled 2022-10-29 (×3): qty 1

## 2022-10-29 MED ORDER — MORPHINE SULFATE (PF) 2 MG/ML IV SOLN: 0.5000 mg | INTRAVENOUS | Status: DC | PRN

## 2022-10-29 MED ORDER — MUPIROCIN 2 % EX OINT
1.0000 | TOPICAL_OINTMENT | Freq: Two times a day (BID) | CUTANEOUS | Status: DC
  Filled 2022-10-29: qty 22

## 2022-10-29 MED ORDER — OXYCODONE HCL 5 MG/5ML PO SOLN: 5.0000 mg | Freq: Once | ORAL | Status: DC | PRN

## 2022-10-29 MED ORDER — METOCLOPRAMIDE HCL 5 MG PO TABS: 5.0000 mg | ORAL_TABLET | Freq: Three times a day (TID) | ORAL | Status: DC | PRN

## 2022-10-29 MED ORDER — ROCURONIUM BROMIDE 100 MG/10ML IV SOLN
INTRAVENOUS | Status: DC | PRN
  Administered 2022-10-29: 50 mg via INTRAVENOUS

## 2022-10-29 MED ORDER — PROPOFOL 10 MG/ML IV BOLUS
INTRAVENOUS | Status: AC
  Filled 2022-10-29: qty 20

## 2022-10-29 MED ORDER — TRANEXAMIC ACID-NACL 1000-0.7 MG/100ML-% IV SOLN
INTRAVENOUS | Status: AC
  Filled 2022-10-29: qty 100

## 2022-10-29 MED ORDER — METOPROLOL TARTRATE 25 MG PO TABS
12.5000 mg | ORAL_TABLET | Freq: Two times a day (BID) | ORAL | Status: DC
  Administered 2022-10-29 – 2022-11-03 (×12): 12.5 mg via ORAL
  Filled 2022-10-29 (×13): qty 1

## 2022-10-29 MED ORDER — PHENYLEPHRINE HCL (PRESSORS) 10 MG/ML IV SOLN
INTRAVENOUS | Status: DC | PRN
  Administered 2022-10-29 (×2): 160 ug via INTRAVENOUS
  Administered 2022-10-29: 80 ug via INTRAVENOUS
  Administered 2022-10-29: 160 ug via INTRAVENOUS
  Administered 2022-10-29: 80 ug via INTRAVENOUS

## 2022-10-29 MED ORDER — ONDANSETRON HCL 4 MG/2ML IJ SOLN
4.0000 mg | Freq: Four times a day (QID) | INTRAMUSCULAR | Status: DC | PRN
  Administered 2022-10-30 – 2022-11-01 (×2): 4 mg via INTRAVENOUS
  Filled 2022-10-29 (×2): qty 2

## 2022-10-29 MED ORDER — PHENYLEPHRINE 80 MCG/ML (10ML) SYRINGE FOR IV PUSH (FOR BLOOD PRESSURE SUPPORT)
PREFILLED_SYRINGE | INTRAVENOUS | Status: AC
  Filled 2022-10-29: qty 10

## 2022-10-29 MED ORDER — LIDOCAINE HCL (CARDIAC) PF 100 MG/5ML IV SOSY
PREFILLED_SYRINGE | INTRAVENOUS | Status: DC | PRN
  Administered 2022-10-29: 50 mg via INTRAVENOUS

## 2022-10-29 MED ORDER — PHENOL 1.4 % MT LIQD: 1.0000 | OROMUCOSAL | Status: DC | PRN

## 2022-10-29 MED ORDER — ROCURONIUM BROMIDE 10 MG/ML (PF) SYRINGE
PREFILLED_SYRINGE | INTRAVENOUS | Status: AC
  Filled 2022-10-29: qty 10

## 2022-10-29 MED ORDER — ONDANSETRON HCL 4 MG PO TABS
4.0000 mg | ORAL_TABLET | Freq: Four times a day (QID) | ORAL | Status: DC | PRN
  Administered 2022-11-03: 4 mg via ORAL
  Filled 2022-10-29: qty 1

## 2022-10-29 MED ORDER — MENTHOL 3 MG MT LOZG: 1.0000 | LOZENGE | OROMUCOSAL | Status: DC | PRN

## 2022-10-29 MED ORDER — CEFAZOLIN SODIUM-DEXTROSE 2-4 GM/100ML-% IV SOLN
INTRAVENOUS | Status: AC
  Filled 2022-10-29: qty 100

## 2022-10-29 MED ORDER — PROPOFOL 10 MG/ML IV BOLUS
INTRAVENOUS | Status: DC | PRN
  Administered 2022-10-29: 80 mg via INTRAVENOUS

## 2022-10-29 MED ORDER — KETAMINE HCL 10 MG/ML IJ SOLN
INTRAMUSCULAR | Status: DC | PRN
  Administered 2022-10-29: 30 mg via INTRAVENOUS
  Administered 2022-10-29: 20 mg via INTRAVENOUS

## 2022-10-29 MED ORDER — ACETAMINOPHEN 10 MG/ML IV SOLN
INTRAVENOUS | Status: AC
  Filled 2022-10-29: qty 100

## 2022-10-29 MED ORDER — ENOXAPARIN SODIUM 40 MG/0.4ML IJ SOSY
40.0000 mg | PREFILLED_SYRINGE | INTRAMUSCULAR | Status: DC
  Administered 2022-10-30: 40 mg via SUBCUTANEOUS
  Filled 2022-10-29: qty 0.4

## 2022-10-29 MED ORDER — ONDANSETRON HCL 4 MG/2ML IJ SOLN
INTRAMUSCULAR | Status: DC | PRN
  Administered 2022-10-29: 4 mg via INTRAVENOUS

## 2022-10-29 MED ORDER — CEFAZOLIN SODIUM-DEXTROSE 2-4 GM/100ML-% IV SOLN
2.0000 g | Freq: Four times a day (QID) | INTRAVENOUS | Status: AC
  Administered 2022-10-29 – 2022-10-30 (×2): 2 g via INTRAVENOUS
  Filled 2022-10-29 (×2): qty 100

## 2022-10-29 MED ORDER — DEXAMETHASONE SODIUM PHOSPHATE 10 MG/ML IJ SOLN
INTRAMUSCULAR | Status: DC | PRN
  Administered 2022-10-29: 5 mg via INTRAVENOUS

## 2022-10-29 MED ORDER — HYDROCODONE-ACETAMINOPHEN 7.5-325 MG PO TABS
1.0000 | ORAL_TABLET | ORAL | Status: DC | PRN
  Administered 2022-10-30 – 2022-11-01 (×3): 1 via ORAL
  Filled 2022-10-29 (×3): qty 1

## 2022-10-29 MED ORDER — LIDOCAINE HCL (PF) 2 % IJ SOLN
INTRAMUSCULAR | Status: AC
  Filled 2022-10-29: qty 5

## 2022-10-29 MED ORDER — OXYCODONE HCL 5 MG PO TABS: 5.0000 mg | ORAL_TABLET | Freq: Once | ORAL | Status: DC | PRN

## 2022-10-29 MED ORDER — ATORVASTATIN CALCIUM 20 MG PO TABS
20.0000 mg | ORAL_TABLET | Freq: Every day | ORAL | Status: DC
  Administered 2022-10-29 – 2022-11-02 (×5): 20 mg via ORAL
  Filled 2022-10-29 (×5): qty 1

## 2022-10-29 MED ORDER — ENSURE ENLIVE PO LIQD
237.0000 mL | Freq: Two times a day (BID) | ORAL | Status: DC
  Administered 2022-10-30 – 2022-11-02 (×6): 237 mL via ORAL

## 2022-10-29 MED ORDER — KETAMINE HCL 50 MG/5ML IJ SOSY
PREFILLED_SYRINGE | INTRAMUSCULAR | Status: AC
  Filled 2022-10-29: qty 5

## 2022-10-29 SURGICAL SUPPLY — 44 items
ADH SKN CLS APL DERMABOND .7 (GAUZE/BANDAGES/DRESSINGS) ×2
APL PRP STRL LF DISP 70% ISPRP (MISCELLANEOUS) ×1
BIT DRILL CALIBRATED 4.2 (BIT) IMPLANT
BIT DRILL CANN 16 HIP (BIT) IMPLANT
BIT DRILL CANN STP 6/9 HIP (BIT) IMPLANT
BIT DRILL TAPERED 10 (BIT) IMPLANT
BLADE TFNA HELICAL 100 NS (Anchor) IMPLANT
BNDG CMPR 5X6 CHSV STRCH STRL (GAUZE/BANDAGES/DRESSINGS) ×2
BNDG COHESIVE 6X5 TAN ST LF (GAUZE/BANDAGES/DRESSINGS) ×2 IMPLANT
CHLORAPREP W/TINT 26 (MISCELLANEOUS) ×1 IMPLANT
DERMABOND ADVANCED .7 DNX12 (GAUZE/BANDAGES/DRESSINGS) ×1 IMPLANT
DRAPE 3/4 80X56 (DRAPES) ×1 IMPLANT
DRAPE C-ARM 42X70 (DRAPES) IMPLANT
DRILL BIT CALIBRATED 4.2 (BIT) ×1
DRSG OPSITE POSTOP 3X4 (GAUZE/BANDAGES/DRESSINGS) IMPLANT
DRSG OPSITE POSTOP 4X6 (GAUZE/BANDAGES/DRESSINGS) IMPLANT
GLOVE PI ORTHO PRO STRL 7.5 (GLOVE) ×2 IMPLANT
GLOVE SURG SYN 7.5 E (GLOVE) ×1 IMPLANT
GLOVE SURG SYN 7.5 PF PI (GLOVE) ×1 IMPLANT
GOWN SRG XL LVL 3 NONREINFORCE (GOWNS) ×1 IMPLANT
GOWN STRL NON-REIN TWL XL LVL3 (GOWNS) ×1
GOWN STRL REUS W/ TWL LRG LVL3 (GOWN DISPOSABLE) ×1 IMPLANT
GOWN STRL REUS W/TWL LRG LVL3 (GOWN DISPOSABLE) ×1
GUIDEWIRE 3.2X400 (WIRE) IMPLANT
HANDLE YANKAUER SUCT OPEN TIP (MISCELLANEOUS) ×1 IMPLANT
IMPL DEG TI CANN 11MM/130 (Orthopedic Implant) IMPLANT
IMPLANT DEG TI CANN 11MM/130 (Orthopedic Implant) ×1 IMPLANT
KIT PATIENT CARE HANA TABLE (KITS) ×1 IMPLANT
MANIFOLD NEPTUNE II (INSTRUMENTS) ×1 IMPLANT
MAT ABSORB FLUID 56X50 GRAY (MISCELLANEOUS) ×1 IMPLANT
NDL FILTER BLUNT 18X1 1/2 (NEEDLE) ×1 IMPLANT
NDL HYPO 21X1.5 SAFETY (NEEDLE) ×1 IMPLANT
NDL SAFETY ECLIP 18X1.5 (MISCELLANEOUS) ×1 IMPLANT
NEEDLE FILTER BLUNT 18X1 1/2 (NEEDLE) ×1 IMPLANT
NEEDLE HYPO 21X1.5 SAFETY (NEEDLE) ×1 IMPLANT
NS IRRIG 500ML POUR BTL (IV SOLUTION) ×1 IMPLANT
PACK HIP COMPR (MISCELLANEOUS) ×1 IMPLANT
SCREW LOCK STAR 5X34 (Screw) IMPLANT
SUT QUILL MONODERM 3-0 PS-2 (SUTURE) ×1 IMPLANT
SUT VIC AB 1 CT1 36 (SUTURE) ×1 IMPLANT
SUT VIC AB 2-0 CT2 27 (SUTURE) ×1 IMPLANT
SYR 30ML LL (SYRINGE) ×1 IMPLANT
SYR TB 1ML LL NO SAFETY (SYRINGE) ×1 IMPLANT
TAPE MICROFOAM 4IN (TAPE) ×1 IMPLANT

## 2022-10-29 NOTE — Assessment & Plan Note (Signed)
Replace with IV kcl 20 meq x 1.

## 2022-10-29 NOTE — Assessment & Plan Note (Addendum)
Fall precaution esp at home upon discharge. Orthostatic vitals once able.  ? If falls are related to BP or volume issue or dysrhythmia we will admit with tele and monitor.

## 2022-10-29 NOTE — Assessment & Plan Note (Signed)
Currently in a.fib, Eliquis held for tomorrows procedure.  We will resume 12-24 hours post procedure.  Continue metoprolol at 12.5 mg bid.

## 2022-10-29 NOTE — Assessment & Plan Note (Signed)
Pt reports rt hip pain from her fall. No reports of dizziness. Daughter at bedside. Pt lives alone.  Orthopedic consulted and eliquis held  tonight for rt hip intramedullary nail placement in am. Pain control PRN. PT and rehab per Ortho.

## 2022-10-29 NOTE — Assessment & Plan Note (Addendum)
Vitals:   10/28/22 1638 10/28/22 1730 10/28/22 1800 10/28/22 1830  BP: 134/67 134/66 133/68 (!) 116/57   10/28/22 1900 10/28/22 2049  BP: (!) 141/101 (!) 131/59  Home medications,lisinopril , amlodipine, Lasix all held as blood pressure was normal. Suspect with pain medication blood pressure will continue to stay normal will resume individually medication at a time to prevent hypotension. Continue metoprolol.

## 2022-10-29 NOTE — Anesthesia Procedure Notes (Signed)
Procedure Name: Intubation Date/Time: 10/29/2022 4:13 PM  Performed by: Jeannene Patella, CRNAPre-anesthesia Checklist: Patient identified, Emergency Drugs available, Suction available, Patient being monitored and Timeout performed Patient Re-evaluated:Patient Re-evaluated prior to induction Oxygen Delivery Method: Circle system utilized Preoxygenation: Pre-oxygenation with 100% oxygen Induction Type: IV induction Ventilation: Mask ventilation without difficulty Laryngoscope Size: McGraph and 3 Grade View: Grade II Tube type: Oral Tube size: 7.0 mm Number of attempts: 1 Airway Equipment and Method: Stylet, LTA kit utilized and Video-laryngoscopy Placement Confirmation: ETT inserted through vocal cords under direct vision, positive ETCO2 and breath sounds checked- equal and bilateral Secured at: 21 cm Tube secured with: Tape Dental Injury: Teeth and Oropharynx as per pre-operative assessment  Comments: Front upper 2 central teeth broken off preop cap left front upper loose preop--all unchanged s/p intubation

## 2022-10-29 NOTE — Op Note (Signed)
Patient Name: Amber Wolfe  NWG:[id number]  Pre-Operative Diagnosis: Right hip Intertrochanteric fracture  Post-Operative Diagnosis: (same)  Procedure: Right Hip Intramedullary nail   Components/Implants: Nail:TFNA 11/130 deg,  Lag Blade : Locking Screw:25mm  Date of Surgery: 10/29/2022  Surgeon: Reinaldo Berber MD  Assistant: None   Anesthesiologist: Lorette Ang  Anesthesia: General   EBL: 125cc  Complications: None   Brief history: The patient is a 89 year old female who presented to the [facility] emergency room after a fall and found to have a  right hip intertrochanteric fracture.  The patient was admitted by the medical team and optimized for surgery.  A thorough discussion was had with the patient and family about the risks and benefits of surgical intervention for their hip fracture as definitive treatment.  The patient and family opted to proceed with the operation.  All preoperative films were reviewed and an appropriate surgical plan was made prior to surgery.   Description of procedure: The patient was brought to the operating room where laterality was confirmed by all those present to be the right side.  The patient was administered anesthesia on a stretcher prior to being moved supine on the operating room table. Patient was given an intravenous dose of antibiotics for surgical prophylaxis and TXA.  All bony prominences and extremities were well padded and the patient was securely attached to the table boots, a perineal post was placed and the patient had a safety strap placed.  Surgical site was prepped with alcohol and chlorhexidine. The surgical site over the hip was and draped in typical sterile fashion with multiple layers of adhesive and nonadhesive drapes.  The incision site was marked out with a sterile marker under fluoroscopic guidance.    A surgical timeout was then called with participation of all staff in the room the patient was  then a confirmed again and laterality confirmed. The hip fracture was reduced through indirect measures with traction and rotation of the leg on the fracture table. After an acceptable reduction was obtained on AP and lateral images the procedure started.  An incision was made just proximal to the greater trochanter through the skin subcutaneous tissues and an incision was made in the glut max fascia.  A guidewire was placed through the greater trochanter at the tip under x-ray guidance into the intertrochanteric region.  The position of this wire was assessed on AP and lateral fluoroscopic images to ensure position.  An opening reamer was used to create access at the tip of the greater trochanter under fluoroscopic guidance.   A size 11mm x136mm x130 deg nail was advanced through the reamed hole in the proximal femur and seated within the femur to an appropriate depth under fluoroscopic guidance.  The aiming jig was used to mark an incision site for a lag blade to the femoral head which was then incised with a scalpel.  A wire was then advanced through the lateral cortex of the femur into the center of the femoral head on both AP and lateral fluoroscopic imaging stopping at the subchondral bone without penetration of the femoral head.  This wire was then used to measure for a lag blade and a size lag blade was inserted into the femoral head through the lateral cortex of the femur and the nail under fluoroscopic guidance.  The setscrew was then tightened in the proximal part of the nail and engaged with the lag blade with a small amount of play left so as to allow  compression of the fracture which was applied with the jig.   The lag screw driver was removed and the aiming jig was switched for the distal interlocking screw.  A drill was used to drill a hole for the distal interlocking screw under fluoroscopic guidance and a size 34mm screw was placed.  The intramedullary nail was assessed on AP and lateral  fluoroscopic guidance prior to removal of the aiming jig.  Final fluoroscopic x-rays were then taken after removal of the jig.  The nail was found to be in appropriate position on AP and lateral imaging with appropriate lengths of both the lag screw in the distal locking screw.  The fascia was closed with 0 Vicryl interrupted figure-of-eight sutures.  The subcutaneous tissues were closed with 2-0 Vicryl and the skin closed with 3-0 Monocryl and Dermabond.  Sterile dressings were applied to the incisions.   The patient was awoken from anesthesia transferred off of the operating room table onto a hospital bed.  The patient had a good pulse postoperatively in the foot . the patient was then transferred to the PACU in stable condition.

## 2022-10-29 NOTE — Anesthesia Preprocedure Evaluation (Signed)
Anesthesia Evaluation  Patient identified by MRN, date of birth, ID band Patient awake    Reviewed: Allergy & Precautions, H&P , NPO status , Patient's Chart, lab work & pertinent test results  History of Anesthesia Complications Negative for: history of anesthetic complications  Airway Mallampati: II  TM Distance: <3 FB Neck ROM: limited    Dental  (+) Poor Dentition, Chipped, Caps, Dental Advidsory Given, Missing   Pulmonary neg pulmonary ROS, neg shortness of breath   Pulmonary exam normal breath sounds clear to auscultation       Cardiovascular Exercise Tolerance: Good hypertension, (-) angina +CHF  (-) Past MI and (-) DOE + dysrhythmias Atrial Fibrillation  Rhythm:irregular Rate:Normal     Neuro/Psych negative neurological ROS  negative psych ROS   GI/Hepatic Neg liver ROS,GERD  Controlled,,  Endo/Other  diabetes, Type 2    Renal/GU negative Renal ROS  negative genitourinary   Musculoskeletal  (+) Arthritis ,    Abdominal   Peds  Hematology negative hematology ROS (+)   Anesthesia Other Findings Past Medical History: No date: Arthritis     Comment: In hands No date: Atrial flutter (HCC)     Comment: a. s/p successful TEE/DCCV 07/31/2015; b. on               Xarelto; c. CHADS2VASc => 6 (CHF, HTN, age x 2,              DM, female) No date: Cataract No date: Chronic systolic CHF (congestive heart failure*     Comment: a. echo 06/2015: EF 40%, basal HK, nl WM of mid              and apical segments, mild to mod MR/TR; b. TEE               07/31/2015: nl LV systolic function, mild to mod               MR, trivial TR No date: Diabetes mellitus without complication (HCC)     Comment: Type II No date: GERD (gastroesophageal reflux disease) No date: Glaucoma     Comment: Right Eye No date: Hyperlipidemia No date: Hypertension No date: Moderate mitral regurgitation     Comment: a. see echo from 06/2015 and TEE  07/2015 No date: PAF (paroxysmal atrial fibrillation) (HCC)     Comment: a.  01/2016 Admitted to [facility] with PAF -->amio               added.  Past Surgical History: No date: CATARACT EXTRACTION Bilateral No date: CESAREAN SECTION 07/31/2015: ELECTROPHYSIOLOGIC STUDY N/A     Comment: Procedure: CARDIOVERSION;  Surgeon: Almond Lint, MD;  Location: [facility] ORS;  Service:               Cardiovascular;  Laterality: N/A; 07/31/2015: TEE WITHOUT CARDIOVERSION N/A     Comment: Procedure: TRANSESOPHAGEAL ECHOCARDIOGRAM               (TEE);  Surgeon: Almond Lint, MD;  Location:               [facility] ORS;  Service: Cardiovascular;                Laterality: N/A; No date: WISDOM TOOTH EXTRACTION No date: Wrist Surgery Left  BMI    Body Mass Index:  27.44 kg/m      Reproductive/Obstetrics negative OB ROS  Anesthesia Physical Anesthesia Plan  ASA: 3  Anesthesia Plan: General   Post-op Pain Management:    Induction: Intravenous  PONV Risk Score and Plan: 3 and Ondansetron, Dexamethasone and Treatment may vary due to age or medical condition  Airway Management Planned: Oral ETT  Additional Equipment:   Intra-op Plan:   Post-operative Plan: Extubation in OR  Informed Consent: I have reviewed the patients History and Physical, chart, labs and discussed the procedure including the risks, benefits and alternatives for the proposed anesthesia with the patient or authorized representative who has indicated his/her understanding and acceptance.     Dental Advisory Given  Plan Discussed with: Anesthesiologist, CRNA and Surgeon  Anesthesia Plan Comments: (Patient consented for risks of anesthesia including but not limited to:  - adverse reactions to medications - damage to eyes, teeth, lips or other oral mucosa - nerve damage due to positioning  - sore throat or hoarseness - Damage to heart, brain, nerves, lungs, other parts  of body or loss of life  Patient voiced understanding.)        Anesthesia Quick Evaluation

## 2022-10-29 NOTE — Assessment & Plan Note (Signed)
Glycemic protocol and accuchecks q 4 hourly.  Pt Npo.

## 2022-10-29 NOTE — Assessment & Plan Note (Signed)
Pt was having chills and shaking in ed ,suspected underlying infection probably uti, we will get lactic , cultures and start empiric iv antibiotics once cultures are collected.  Rectal temperature.

## 2022-10-29 NOTE — Progress Notes (Addendum)
Progress Note    Amber Wolfe  ZDG:[id number] DOB: [birth year]  DOA: 10/28/2022 PCP: Armando Gang, FNP      Brief Narrative:    Medical records reviewed and are as summarized below:  Amber Wolfe is a 89 y.o. 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  She said she tripped while walking from her kitchen.  She did not lose consciousness.  She was found to have left hip fracture.     Assessment/Plan:   Principal Problem:   Displaced intertrochanteric fracture of right femur, initial encounter for closed fracture [facility]) Active Problems:   Diabetes mellitus with neuropathy (HCC)   Essential hypertension, benign   Atrial fibrillation (HCC)   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, right (HCC)   Chills (without fever)   Falls   Hypokalemia   Body mass index is 24.21 kg/m.   Right intertrochanteric femur fracture, s/p mechanical fall at home: Plan for right hip surgery today.  Analgesics as needed for pain.  Follow-up with orthopedic surgeon.  Hypokalemia: Repleted.  Repeat potassium tomorrow.  Chills: Improved.  No fever.  Urinalysis was unremarkable.  No acute abnormality on CT chest.  No indication for course of antibiotics at this time.   Paroxysmal atrial fibrillation: Eliquis on hold for upcoming surgery.  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)    Diet Order             Diet NPO time specified  Diet effective now                            Consultants: Orthopedic surgeon  Procedures: Plan for right  surgery    Medications:    atorvastatin  20 mg Oral QHS   docusate sodium  100 mg Oral BID   letrozole  2.5 mg Oral QPM   metoprolol tartrate  12.5 mg Oral BID   mupirocin ointment  1 Application Nasal BID   Continuous Infusions:  methocarbamol (ROBAXIN) IV       Anti-infectives (From admission, onward)    None              Family Communication/Anticipated D/C date and plan/Code Status   DVT prophylaxis: SCDs Start: 10/28/22 1938     Code Status: Full Code  Family Communication: None Disposition Plan: Plan to discharge to SNF   Status is: Inpatient Remains inpatient appropriate because: Right hip fracture       Subjective:   Interval events noted.  She complains of pain in the right hip.  No other complaints.  Objective:    Vitals:   10/28/22 1830 10/28/22 1900 10/28/22 2049 10/29/22 0838  BP: (!) 116/57 (!) 141/101 (!) 131/59 (!) 105/55  Pulse: 64 73 85 72  Resp: 18 (!) 24 18 16   Temp:   98.4 F (36.9 C) 98.3 F (36.8 C)  TempSrc:   Oral   SpO2: 100% 99% 100% 97%  Weight:      Height:       No data found.  No intake or output data in the 24 hours ending  10/29/22 1000 Filed Weights   10/28/22 1638  Weight: 66 kg    Exam:  GEN: NAD SKIN: Warm and dry EYES: No pallor or icterus ENT: MMM CV: Irregular rate and rhythm PULM: CTA B ABD: soft, ND, NT, +BS CNS: AAO x 3, non focal EXT: Right hip tenderness      Data Reviewed:   I have personally reviewed following labs and imaging studies:  Labs: Labs show the following:   Basic Metabolic Panel: Recent Labs  Lab 10/28/22 1641  NA 136  K 3.4*  CL 106  CO2 22  GLUCOSE 243*  BUN 20  CREATININE 0.97  CALCIUM 8.8*   GFR Estimated Creatinine Clearance: 35.4 mL/min (by C-G formula based on SCr of 0.97 mg/dL). Liver Function Tests: Recent Labs  Lab 10/28/22 1641  AST 21  ALT 12  ALKPHOS 46  BILITOT 0.9  PROT 6.4*  ALBUMIN 3.8   No results for input(s):  "LIPASE", "AMYLASE" in the last 168 hours. No results for input(s): "AMMONIA" in the last 168 hours. Coagulation profile Recent Labs  Lab 10/28/22 1641  INR 1.4*    CBC: Recent Labs  Lab 10/28/22 1641  WBC 11.9*  HGB 12.8  HCT 37.4  MCV 94.9  PLT 178   Cardiac Enzymes: No results for input(s): "CKTOTAL", "CKMB", "CKMBINDEX", "TROPONINI" in the last 168 hours. BNP (last 3 results) No results for input(s): "PROBNP" in the last 8760 hours. CBG: Recent Labs  Lab 10/29/22 0550  GLUCAP 218*   D-Dimer: No results for input(s): "DDIMER" in the last 72 hours. Hgb A1c: Recent Labs    10/29/22 0218  HGBA1C 6.9*   Lipid Profile: No results for input(s): "CHOL", "HDL", "LDLCALC", "TRIG", "CHOLHDL", "LDLDIRECT" in the last 72 hours. Thyroid function studies: No results for input(s): "TSH", "T4TOTAL", "T3FREE", "THYROIDAB" in the last 72 hours.  Invalid input(s): "FREET3" Anemia work up: No results for input(s): "VITAMINB12", "FOLATE", "FERRITIN", "TIBC", "IRON", "RETICCTPCT" in the last 72 hours. Sepsis Labs: Recent Labs  Lab 10/28/22 1641 10/28/22 2127 10/29/22 0152  WBC 11.9*  --   --   LATICACIDVEN  --  2.6* 1.1    Microbiology Recent Results (from the past 240 hour(s))  Culture, blood (Routine X 2) w Reflex to ID Panel     Status: None (Preliminary result)   Collection Time: 10/28/22  9:17 PM   Specimen: BLOOD  Result Value Ref Range Status   Specimen Description BLOOD RIGHT ANTECUBITAL  Final   Special Requests   Final    BOTTLES DRAWN AEROBIC AND ANAEROBIC Blood Culture adequate volume   Culture   Final    NO GROWTH < 12 HOURS Performed at [facility], [address] [place], [place] [address]    Report Status PENDING  Incomplete  Culture, blood (Routine X 2) w Reflex to ID Panel     Status: None (Preliminary result)   Collection Time: 10/28/22  9:27 PM   Specimen: BLOOD  Result Value Ref Range Status   Specimen Description BLOOD LEFT  ANTECUBITAL  Final   Special Requests   Final    BOTTLES DRAWN AEROBIC AND ANAEROBIC Blood Culture results may not be optimal due to an excessive volume of blood received in culture bottles   Culture   Final    NO GROWTH < 12 HOURS Performed at [facility], [address], [place], [place] [address]    Report Status PENDING  Incomplete  Surgical PCR screen     Status:  None   Collection Time: 10/29/22  6:34 AM   Specimen: Nasal Mucosa; Nasal Swab  Result Value Ref Range Status   MRSA, PCR NEGATIVE NEGATIVE Final   Staphylococcus aureus NEGATIVE NEGATIVE Final    Comment: (NOTE) The Xpert SA Assay (FDA approved for NASAL specimens in patients 16 years of age and older), is one component of a comprehensive surveillance program. It is not intended to diagnose infection nor to guide or monitor treatment. Performed at [facility], [address] [place], [place] [address]     Procedures and diagnostic studies:  CT Angio Chest Pulmonary Embolism (PE) W or WO Contrast  Result Date: 10/28/2022 CLINICAL DATA:  Fall EXAM: CT ANGIOGRAPHY CHEST WITH CONTRAST TECHNIQUE: Multidetector CT imaging of the chest was performed using the standard protocol during bolus administration of intravenous contrast. Multiplanar CT image reconstructions and MIPs were obtained to evaluate the vascular anatomy. RADIATION DOSE REDUCTION: This exam was performed according to the departmental dose-optimization program which includes automated exposure control, adjustment of the mA and/or kV according to patient size and/or use of iterative reconstruction technique. CONTRAST:  75mL OMNIPAQUE IOHEXOL 350 MG/ML SOLN COMPARISON:  Chest x-ray 04/29/2022, chest CT 12/06/2017 FINDINGS: Cardiovascular: Satisfactory opacification of the pulmonary arteries to the segmental level. No evidence of pulmonary embolism. Moderate aortic atherosclerosis. No aneurysm. Coronary vascular calcification. Borderline cardiac  enlargement. No pericardial effusion Mediastinum/Nodes: Midline trachea. No thyroid mass. No suspicious lymph nodes. Small moderate hiatal hernia Lungs/Pleura: Progression of reticular disease at the apices consistent with fibrosis. Diffuse bilateral mostly subpleural reticular change slightly progressed. No acute airspace disease, pleural effusion or pneumothorax Upper Abdomen: No acute finding Musculoskeletal: No acute or suspicious osseous abnormality. Review of the MIP images confirms the above findings. IMPRESSION: 1. Negative for acute pulmonary embolus or aortic dissection. 2. Progression of fibrotic changes in the lungs. 3. Aortic atherosclerosis. Aortic Atherosclerosis (ICD10-I70.0). Electronically Signed   By: Jasmine Pang M.D.   On: 10/28/2022 21:52   DG FEMUR, MIN 2 VIEWS RIGHT  Result Date: 10/28/2022 CLINICAL DATA:  Fall with right hip fracture EXAM: RIGHT FEMUR 2 VIEWS COMPARISON:  10/28/2022 FINDINGS: Only the mid to distal femur is included. There is no fracture or malalignment. Moderate severe tricompartment arthritis of the knee, worst involving the medial joint space. Vascular calcifications IMPRESSION: No acute osseous abnormality. Electronically Signed   By: Jasmine Pang M.D.   On: 10/28/2022 21:32   CT Head Wo Contrast  Result Date: 10/28/2022 CLINICAL DATA:  Head trauma, intracranial venous injury suspected; Facial trauma, blunt EXAM: CT HEAD WITHOUT CONTRAST CT CERVICAL SPINE WITHOUT CONTRAST TECHNIQUE: Multidetector CT imaging of the head and cervical spine was performed following the standard protocol without intravenous contrast. Multiplanar CT image reconstructions of the cervical spine were also generated. RADIATION DOSE REDUCTION: This exam was performed according to the departmental dose-optimization program which includes automated exposure control, adjustment of the mA and/or kV according to patient size and/or use of iterative reconstruction technique. COMPARISON:  CT  Head and C Spine 12/06/17 FINDINGS: CT HEAD FINDINGS Brain: No evidence of acute infarction, hemorrhage, hydrocephalus, extra-axial collection or mass lesion/mass effect. Sequela of moderate chronic microvascular ischemic change. 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 Vascular: No hyperdense vessel or unexpected calcification. Skull: Normal. Negative for fracture or focal lesion. Sinuses/Orbits: No middle ear or mastoid effusion. Paranasal sinuses are clear. Bilateral lens replacement. Orbits are otherwise unremarkable. Other: None. CT CERVICAL SPINE FINDINGS Alignment: Grade 1 anterolisthesis of C3 on C4 and  C7 on T1. Skull base and vertebrae: No acute fracture. No primary bone lesion or focal pathologic process. Soft tissues and spinal canal: No prevertebral fluid or swelling. No visible canal hematoma. Disc levels:  No evidence of high-grade spinal canal stenosis. Upper chest: Bilateral subpleural reticulations can be seen in the setting of chronic interstitial lung disease. Other: Calcified atherosclerotic plaque of the carotid bifurcations bilaterally. IMPRESSION: 1. No CT evidence of intracranial injury. 2. No acute fracture or traumatic malalignment of the cervical spine. Electronically Signed   By: Lorenza Cambridge M.D.   On: 10/28/2022 17:41   CT Cervical Spine Wo Contrast  Result Date: 10/28/2022 CLINICAL DATA:  Head trauma, intracranial venous injury suspected; Facial trauma, blunt EXAM: CT HEAD WITHOUT CONTRAST CT CERVICAL SPINE WITHOUT CONTRAST TECHNIQUE: Multidetector CT imaging of the head and cervical spine was performed following the standard protocol without intravenous contrast. Multiplanar CT image reconstructions of the cervical spine were also generated. RADIATION DOSE REDUCTION: This exam was performed according to the departmental dose-optimization program which includes automated exposure control, adjustment of the mA and/or kV according to patient size and/or use of iterative  reconstruction technique. COMPARISON:  CT Head and C Spine 12/06/17 FINDINGS: CT HEAD FINDINGS Brain: No evidence of acute infarction, hemorrhage, hydrocephalus, extra-axial collection or mass lesion/mass effect. Sequela of moderate chronic microvascular ischemic change. 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 Vascular: No hyperdense vessel or unexpected calcification. Skull: Normal. Negative for fracture or focal lesion. Sinuses/Orbits: No middle ear or mastoid effusion. Paranasal sinuses are clear. Bilateral lens replacement. Orbits are otherwise unremarkable. Other: None. CT CERVICAL SPINE FINDINGS Alignment: Grade 1 anterolisthesis of C3 on C4 and C7 on T1. Skull base and vertebrae: No acute fracture. No primary bone lesion or focal pathologic process. Soft tissues and spinal canal: No prevertebral fluid or swelling. No visible canal hematoma. Disc levels:  No evidence of high-grade spinal canal stenosis. Upper chest: Bilateral subpleural reticulations can be seen in the setting of chronic interstitial lung disease. Other: Calcified atherosclerotic plaque of the carotid bifurcations bilaterally. IMPRESSION: 1. No CT evidence of intracranial injury. 2. No acute fracture or traumatic malalignment of the cervical spine. Electronically Signed   By: Lorenza Cambridge M.D.   On: 10/28/2022 17:41   DG Chest 1 View  Result Date: 10/28/2022 CLINICAL DATA:  Right hip deformity.  Trauma EXAM: CHEST  1 VIEW COMPARISON:  X-ray 12/06/2017 FINDINGS: Calcified aorta. Film is rotated to the left. No consolidation, pneumothorax or effusion. No edema. Slight thickening along the minor fissure. Questionable nodule left midlung overlying the posterior aspect of the left seventh rib. Degenerative changes of the spine. IMPRESSION: Small nodular density along the left midthorax. This could be summation of shadow. Recommend standard two-view x-ray to further delineate or CT when appropriate Electronically Signed   By: Karen Kays  M.D.   On: 10/28/2022 17:35   DG Hip Unilat W or Wo Pelvis 2-3 Views Right  Result Date: 10/28/2022 CLINICAL DATA:  Pain after fall EXAM: DG HIP (WITH OR WITHOUT PELVIS) 3V RIGHT COMPARISON:  None Available. FINDINGS: Comminuted foreshortened and angulated intertrochanteric right hip fracture. No additional fracture or dislocation. Preserved joint spaces. Osteopenia. Note is made of prominent colonic stool. IMPRESSION: Comminuted foreshortened and angulated intertrochanteric right hip fracture. Electronically Signed   By: Karen Kays M.D.   On: 10/28/2022 17:33               LOS: 1 day   Demaya Hardge  [facility]  Pager on [email or web address]. If 7PM-7AM, please contact night-coverage at [email or web address]     10/29/2022, 10:00 AM

## 2022-10-29 NOTE — Plan of Care (Signed)
  Problem: Education: Goal: Knowledge of General Education information will improve Description: Including pain rating scale, medication(s)/side effects and non-pharmacologic comfort measures Outcome: Progressing   Problem: Safety: Goal: Ability to remain free from injury will improve Outcome: Progressing   Problem: Skin Integrity: Goal: Risk for impaired skin integrity will decrease Outcome: Progressing   Problem: Skin Integrity: Goal: Risk for impaired skin integrity will decrease Outcome: Progressing

## 2022-10-29 NOTE — Assessment & Plan Note (Signed)
-   Continue letrozole 

## 2022-10-29 NOTE — Progress Notes (Signed)
Initial Nutrition Assessment  DOCUMENTATION CODES:   Not applicable  INTERVENTION:   -Once diet is advanced:  -Ensure Enlive po BID, each supplement provides 350 kcal and 20 grams of protein -MVI with minerals daily  NUTRITION DIAGNOSIS:   Increased nutrient needs related to post-op healing as evidenced by estimated needs.  GOAL:   Patient will meet greater than or equal to 90% of their needs  MONITOR:   PO intake, Supplement acceptance, Diet advancement  REASON FOR ASSESSMENT:   Consult Assessment of nutrition requirement/status, Hip fracture protocol  ASSESSMENT:   Pt with medical history significant for atrial fibrillation atrial flutter on Eliquis twice daily 2.5, diabetes mellitus type 2, hypertension, glaucoma, GERD admitted s/p fall while walking no loss of consciousness or patient did hit her head.  Pt admitted with rt femur fracture.   Pt NPO for surgery today.   Spoke with pt and daughter at bedside. Pt reports feeling okay, but anxious about surgery. Emotional support provided.   Pt understanding of NPO order. PTA she had a good appetite, cosnuming 2-3 meals per day.   Pt denies any weight loss. Wt has been stable over the past year.   Discussed importance of good meal and supplement intake to promote healing. Pt amenable to supplements.    Medications reviewed and include colace.   Lab Results  Component Value Date   HGBA1C 6.9 (H) 10/29/2022   PTA DM medications are 12 units insulin glargine daily.   Labs reviewed: K: 3.4, CBGS: 134-218 (inpatient orders for glycemic control are ).    NUTRITION - FOCUSED PHYSICAL EXAM:  Flowsheet Row Most Recent Value  Orbital Region No depletion  Upper Arm Region Mild depletion  Thoracic and Lumbar Region No depletion  Buccal Region No depletion  Temple Region No depletion  Clavicle Bone Region No depletion  Clavicle and Acromion Bone Region No depletion  Scapular Bone Region No depletion  Dorsal Hand No  depletion  Patellar Region No depletion  Anterior Thigh Region No depletion  Posterior Calf Region No depletion  Edema (RD Assessment) None  Hair Reviewed  Eyes Reviewed  Mouth Reviewed  Skin Reviewed  Nails Reviewed       Diet Order:   Diet Order             Diet NPO time specified  Diet effective now                   EDUCATION NEEDS:   Education needs have been addressed  Skin:  Skin Assessment: Reviewed RN Assessment  Last BM:  10/27/22  Height:   Ht Readings from Last 1 Encounters:  10/29/22 5\' 5"  (1.651 m)    Weight:   Wt Readings from Last 1 Encounters:  10/29/22 66 kg    Ideal Body Weight:  56.8 kg  BMI:  Body mass index is 24.21 kg/m.  Estimated Nutritional Needs:   Kcal:  1650-1850  Protein:  85-100 grams  Fluid:  > 1.6 L    Levada Schilling, RD, LDN, CDCES Registered Dietitian II Certified Diabetes Care and Education Specialist Please refer to [facility] for RD and/or RD on-call/weekend/after hours pager

## 2022-10-29 NOTE — Anesthesia Postprocedure Evaluation (Signed)
Anesthesia Post Note  Patient: Amber Wolfe  Procedure(s) Performed: INTRAMEDULLARY (IM) NAIL INTERTROCHANTERIC (Right)  Patient location during evaluation: PACU Anesthesia Type: General Level of consciousness: awake and alert Pain management: pain level controlled Vital Signs Assessment: post-procedure vital signs reviewed and stable Respiratory status: spontaneous breathing, nonlabored ventilation, respiratory function stable and patient connected to nasal cannula oxygen Cardiovascular status: blood pressure returned to baseline and stable Postop Assessment: no apparent nausea or vomiting Anesthetic complications: no  No notable events documented.   Last Vitals:  Vitals:   10/29/22 1825 10/29/22 1852  BP: (!) 124/44 122/70  Pulse: 85 79  Resp: 14 16  Temp:  36.8 C  SpO2: 100% 100%    Last Pain:  Vitals:   10/29/22 1825  TempSrc:   PainSc: 2                  Stephanie Coup

## 2022-10-29 NOTE — Transfer of Care (Signed)
Immediate Anesthesia Transfer of Care Note  Patient: Amber Wolfe  Procedure(s) Performed: INTRAMEDULLARY (IM) NAIL INTERTROCHANTERIC (Right)  Patient Location: PACU  Anesthesia Type:General  Level of Consciousness: drowsy and patient cooperative  Airway & Oxygen Therapy: Patient Spontanous Breathing and Patient connected to face mask oxygen  Post-op Assessment: Report given to RN and Post -op Vital signs reviewed and stable  Post vital signs: Reviewed and stable  Last Vitals:  Vitals Value Taken Time  BP 126/70 10/29/22 1734  Temp    Pulse 76 10/29/22 1736  Resp 20 10/29/22 1736  SpO2 100 % 10/29/22 1736  Vitals shown include unfiled device data.  Last Pain:  Vitals:   10/29/22 1636  TempSrc: Oral  PainSc:       Patients Stated Pain Goal: 0 (10/29/22 1514)  Complications: No notable events documented.

## 2022-10-30 ENCOUNTER — Encounter: Payer: Self-pay | Admitting: Orthopedic Surgery

## 2022-10-30 DIAGNOSIS — S72141A Displaced intertrochanteric fracture of right femur, initial encounter for closed fracture: Secondary | ICD-10-CM | POA: Diagnosis not present

## 2022-10-30 LAB — GLUCOSE, CAPILLARY
Glucose-Capillary: 202 mg/dL — ABNORMAL HIGH (ref 70–99)
Glucose-Capillary: 207 mg/dL — ABNORMAL HIGH (ref 70–99)
Glucose-Capillary: 235 mg/dL — ABNORMAL HIGH (ref 70–99)
Glucose-Capillary: 237 mg/dL — ABNORMAL HIGH (ref 70–99)
Glucose-Capillary: 239 mg/dL — ABNORMAL HIGH (ref 70–99)
Glucose-Capillary: 267 mg/dL — ABNORMAL HIGH (ref 70–99)
Glucose-Capillary: 285 mg/dL — ABNORMAL HIGH (ref 70–99)
Glucose-Capillary: 291 mg/dL — ABNORMAL HIGH (ref 70–99)

## 2022-10-30 MED ORDER — INSULIN GLARGINE-YFGN 100 UNIT/ML ~~LOC~~ SOLN: 12.0000 [IU] | Freq: Every day | SUBCUTANEOUS | Status: DC

## 2022-10-30 MED ORDER — APIXABAN 5 MG PO TABS
5.0000 mg | ORAL_TABLET | Freq: Two times a day (BID) | ORAL | Status: DC
  Administered 2022-10-31 – 2022-11-03 (×7): 5 mg via ORAL
  Filled 2022-10-30 (×7): qty 1

## 2022-10-30 MED ORDER — INSULIN ASPART 100 UNIT/ML IJ SOLN
0.0000 [IU] | Freq: Three times a day (TID) | INTRAMUSCULAR | Status: DC
  Administered 2022-10-30: 3 [IU] via SUBCUTANEOUS
  Administered 2022-10-30: 5 [IU] via SUBCUTANEOUS
  Administered 2022-10-30: 3 [IU] via SUBCUTANEOUS
  Administered 2022-10-31: 2 [IU] via SUBCUTANEOUS
  Administered 2022-10-31 (×2): 3 [IU] via SUBCUTANEOUS
  Administered 2022-11-01: 2 [IU] via SUBCUTANEOUS
  Administered 2022-11-01 (×2): 1 [IU] via SUBCUTANEOUS
  Administered 2022-11-02 (×2): 2 [IU] via SUBCUTANEOUS
  Administered 2022-11-02: 3 [IU] via SUBCUTANEOUS
  Administered 2022-11-03: 2 [IU] via SUBCUTANEOUS
  Filled 2022-10-30 (×13): qty 1

## 2022-10-30 MED ORDER — INSULIN GLARGINE-YFGN 100 UNIT/ML ~~LOC~~ SOLN
10.0000 [IU] | Freq: Every day | SUBCUTANEOUS | Status: DC
  Administered 2022-10-30: 10 [IU] via SUBCUTANEOUS
  Filled 2022-10-30 (×2): qty 0.1

## 2022-10-30 NOTE — Progress Notes (Addendum)
Subjective: 1 Day Post-Op Procedure(s) (LRB): INTRAMEDULLARY (IM) NAIL INTERTROCHANTERIC (Right) Patient reports pain as mild.   Patient is well, and has had no acute complaints or problems Denies any CP, SOB, ABD pain. We will start therapy today.   Objective: Vital signs in last 24 hours: Temp:  [96.2 F (35.7 C)-98.7 F (37.1 C)] 98 F (36.7 C) (08/02 0730) Pulse Rate:  [62-86] 86 (08/02 0730) Resp:  [13-23] 18 (08/02 0730) BP: (105-133)/(44-70) 109/61 (08/02 0730) SpO2:  [95 %-100 %] 100 % (08/02 0730) Weight:  [66 kg] 66 kg (08/01 1514)  Intake/Output from previous day: 08/01 0701 - 08/02 0700 In: 1240 [P.O.:240; I.V.:700; IV Piggyback:300] Out: 1475 [Urine:1350; Blood:125] Intake/Output this shift: No intake/output data recorded.  Recent Labs    10/28/22 1641 10/29/22 0451 10/30/22 0552  HGB 12.8 11.3* 8.1*   Recent Labs    10/29/22 0451 10/30/22 0552  WBC 9.9 9.6  RBC 3.51* 2.45*  HCT 34.4* 23.0*  PLT 171 126*   Recent Labs    10/28/22 1641 10/30/22 0552  NA 136 136  K 3.4* 4.0  CL 106 106  CO2 22 23  BUN 20 21  CREATININE 0.97 0.79  GLUCOSE 243* 262*  CALCIUM 8.8* 8.1*   Recent Labs    10/28/22 1641  INR 1.4*    EXAM General - Patient is Alert, Appropriate, and Oriented Extremity - Neurovascular intact Sensation intact distally Intact pulses distally Dorsiflexion/Plantar flexion intact No cellulitis present Compartment soft Dressing - dressing C/D/I and no drainage Motor Function - intact, moving foot and toes well on exam.   Past Medical History:  Diagnosis Date   Arthritis    In hands   Atrial flutter (HCC)    a. s/p successful TEE/DCCV 07/31/2015; b. 01/2016 Amio added for Afib; c. 11/2017 Amio d/c'd 2/2 bradycardia; d. 12/2018 Back in aflutter-->Zio: Avg HR 74 (41-176) 100% Afl->bb added back; e. CHADS2VASc => 6 (CHF, HTN, age x 2, DM, female)-->eliquis 2.5 bid.   Bradycardia    a. 11/2017 noted during hospitalization @  [facility]-->amio d/c'd by cardiology.   Cataract    Chronic combined systolic (congestive) and diastolic (congestive) heart failure (HCC)    a. 06/2015 Echo: EF 40%, basal HK, nl WM of mid and apical segments, mild to mod MR/TR; b. TEE 07/31/2015: nl LV systolic function, mild to mod MR, trivial TR; c. 06/2017 Echo: EF 55-60%, mild LVH, Gr2 DD, triv AI, mild MR, mod dil LA, mildly dil RA, mild to mod TR. PASP 35-66mmHg; d. 01/2019 Echo: Ef 55-60%, mod dil LA mildly dil RA. Mild TR. Trace MR.   Diabetes mellitus without complication (HCC)    Type II   GERD (gastroesophageal reflux disease)    Glaucoma    Right Eye   Hematoma of abdominal wall    a. 11/2017 Fall-->R flank hematoma w/ anemia req PRBC. Xarelto dc'd at that time.   History of SCC (squamous cell carcinoma) of skin 08/20/2020   left forearm EDC   Hyperlipidemia    Hypertension    Moderate mitral regurgitation    a. see echo from 06/2015 and TEE 07/2015; b. 06/2017 Echo: Mild MR.   PAF (paroxysmal atrial fibrillation) (HCC)    a.  01/2016 Admitted to [facility] with PAF -->amio added (later dc'd 11/2017 2/2 bradycardia).   Right renal mass    a. 11/2017 CT [facility]): 3.4cm R upper pole renal mass concerning for renal cell carcinoma-->outpt f/u w/ urology.    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   Weight as of this encounter: 66 kg. Advance diet Up with therapy Pain well controlled Acute post op blood loss  anemia - Hgb 8.1. Start Fe supplement and recheck Hgb in the am VSS CM to assist with discharge to home with HHPT  Follow up with [facility] in 2 weeks Lovenox 40 mg SQ daily x 14 days   DVT Prophylaxis - Lovenox, TED hose, and SCDS Weight-Bearing as tolerated to right leg   T. Cranston Neighbor, PA-C [facility] 10/30/2022, 8:09 AM   Patient seen and examined, agree with above plan.  The patient is doing well status post right hip intramedullary nail, no concerns at this time.  Pain is controlled.  Discussed DVT prophylaxis, pain medication use, and safe transition to home versus rehab with patient.  All questions answered.   Reinaldo Berber MD

## 2022-10-30 NOTE — Plan of Care (Signed)

## 2022-10-30 NOTE — Evaluation (Signed)
Physical Therapy Evaluation Patient Details Name: Amber Wolfe MRN: [id number] DOB: 11-May-1931 Today's Date: 10/30/2022  History of Present Illness  Amber Wolfe is an 89yoF who comes to [facility] 10/28/22 after fall at home, tripped, hit head, imaging revealing of right intertrochanteric fracture, underwent Rt hip IM fixation c Dr. Audelia Acton. Pt is WBAT postoperatively. Hb down to 8.1 POD1 v 11.3 preop.  Clinical Impression  Pt in recliner on entry, just finished toilet transfer with RN. Pt is alert, social, interactive, calm, oriented to situation, but has clear short term memory impairment, lots of repetitive conversation in session, such as forgetting who Thereasa Parkin is or why Thereasa Parkin is visiting 3 times. Pt requires minA to stand, minGuard to walk a slow, precarious 25ft, with sustained dizziness thereafter. Pt has poor insight into situation and current ability to be safe at DC. Pt in recliner at exit, family visitor oriented to room phone to obtain lunch order.         If plan is discharge home, recommend the following: A lot of help with walking and/or transfers   Can travel by private vehicle   No    Equipment Recommendations None recommended by PT  Recommendations for Other Services       Functional Status Assessment Patient has had a recent decline in their functional status and demonstrates the ability to make significant improvements in function in a reasonable and predictable amount of time.     Precautions / Restrictions Precautions Precautions: Fall Restrictions Weight Bearing Restrictions: Yes RLE Weight Bearing: Weight bearing as tolerated      Mobility  Bed Mobility                    Transfers Overall transfer level: Needs assistance Equipment used: Rolling walker (2 wheels) Transfers: Sit to/from Stand Sit to Stand: Min assist                Ambulation/Gait Ambulation/Gait assistance: Min guard Gait Distance (Feet): 7 Feet Assistive device: Rolling  walker (2 wheels) Gait Pattern/deviations: WFL(Within Functional Limits)       General Gait Details: weak, slow, safe RW use, sustained dizziness after bout, poor resolution.  Stairs            Wheelchair Mobility     Tilt Bed    Modified Rankin (Stroke Patients Only)       Balance                                             Pertinent Vitals/Pain Pain Assessment Pain Assessment: 0-10 Pain Score: 9  Pain Location: operative hip Pain Intervention(s): Limited activity within patient's tolerance, Monitored during session, Premedicated before session, Patient requesting pain meds-RN notified    Home Living Family/patient expects to be discharged to:: Private residence Living Arrangements: Alone Available Help at Discharge: Family Type of Home: House           Home Equipment: Agricultural consultant (2 wheels)      Prior Function               Mobility Comments: mostly home bound, DTRs assist; limited compliance with RW at baseline per family       Hand Dominance        Extremity/Trunk Assessment                Communication  Cognition Arousal/Alertness: Awake/alert Behavior During Therapy: WFL for tasks assessed/performed Overall Cognitive Status: History of cognitive impairments - at baseline (ST memory impairment, repetitive conversation.)                                          General Comments      Exercises     Assessment/Plan    PT Assessment Patient needs continued PT services  PT Problem List Decreased strength;Decreased range of motion;Decreased activity tolerance;Decreased balance;Decreased mobility;Decreased coordination;Decreased knowledge of use of DME;Decreased safety awareness;Decreased knowledge of precautions       PT Treatment Interventions DME instruction;Gait training;Neuromuscular re-education;Stair training;Functional mobility training;Therapeutic activities;Therapeutic  exercise;Balance training;Patient/family education    PT Goals (Current goals can be found in the Care Plan section)  Acute Rehab PT Goals Patient Stated Goal: regain AMB status at rehab prior to return to home. PT Goal Formulation: With family Time For Goal Achievement: 11/13/22 Potential to Achieve Goals: Fair    Frequency BID     Co-evaluation               AM-PAC PT "6 Clicks" Mobility  Outcome Measure Help needed turning from your back to your side while in a flat bed without using bedrails?: A Lot Help needed moving from lying on your back to sitting on the side of a flat bed without using bedrails?: A Lot Help needed moving to and from a bed to a chair (including a wheelchair)?: A Lot Help needed standing up from a chair using your arms (e.g., wheelchair or bedside chair)?: A Lot Help needed to walk in hospital room?: A Lot Help needed climbing 3-5 steps with a railing? : A Lot 6 Click Score: 12    End of Session Equipment Utilized During Treatment: Gait belt Activity Tolerance: Treatment limited secondary to medical complications (Comment) Patient left: in chair;with family/visitor present;with call bell/phone within reach;with chair alarm set Nurse Communication: Mobility status PT Visit Diagnosis: Difficulty in walking, not elsewhere classified (R26.2);Other abnormalities of gait and mobility (R26.89)    Time: 1610-9604 PT Time Calculation (min) (ACUTE ONLY): 20 min   Charges:   PT Evaluation $PT Eval Moderate Complexity: 1 Mod   PT General Charges $$ ACUTE PT VISIT: 1 Visit       3:44 PM, 10/30/22 Rosamaria Lints, PT, DPT Physical Therapist - [facility]  [phone] (ASCOM)    , C 10/30/2022, 3:42 PM

## 2022-10-30 NOTE — Inpatient Diabetes Management (Signed)
Inpatient Diabetes Program Recommendations  AACE/ADA: New Consensus Statement on Inpatient Glycemic Control (2015)  Target Ranges:  Prepandial:   less than 140 mg/dL      Peak postprandial:   less than 180 mg/dL (1-2 hours)      Critically ill patients:  140 - 180 mg/dL    Latest Reference Range & Units 10/29/22 02:18  Hemoglobin A1C 4.8 - 5.6 % 6.9 (H)  (H): Data is abnormally high  Latest Reference Range & Units 10/29/22 05:50 10/29/22 12:00 10/29/22 15:25 10/29/22 17:36 10/29/22 20:09  Glucose-Capillary 70 - 99 mg/dL 324 (H)  3 units Novolog @0831  134 (H) 145 (H)  5 mg Decadron @1634  176 (H) 218 (H)  (H): Data is abnormally high  Latest Reference Range & Units 10/30/22 00:52 10/30/22 04:01  Glucose-Capillary 70 - 99 mg/dL 401 (H)  5 units Novolog  237 (H)  5 units Novolog   (H): Data is abnormally high    Admit with: Fall w/ Hip Fracture  History: DM2  Home DM Meds: Lantus 12 units QHS  Current Orders: Novolog 0-9 units Q4 hours (PRN)     MD- Note pt got Decadron last PM for Surgery.  CBGs >200.  Please consider:  1. Change the Novolog SSI order from PRN to scheduled Q4 hours  2. Start Semglee 6 units Daily (50% home dose)     --Will follow patient during hospitalization--  Ambrose Finland RN, MSN, CDCES Diabetes Coordinator Inpatient Glycemic Control Team Team Pager: [phone] (8a-5p)

## 2022-10-30 NOTE — Care Management Important Message (Signed)
Important Message  Patient Details  Name: BRYONA FOXWORTHY MRN: [id number] Date of Birth: 23-Feb-1932   Medicare Important Message Given:  N/A - LOS <3 / Initial given by admissions     Olegario Messier A  10/30/2022, 8:52 AM

## 2022-10-30 NOTE — Progress Notes (Signed)
Physical Therapy Treatment Patient Details Name: Amber Wolfe MRN: [id number] DOB: July 09, 1931 Today's Date: 10/30/2022   History of Present Illness Amber Wolfe is an 89yoF who comes to [facility] 10/28/22 after fall at home, tripped, hit head, imaging revealing of right intertrochanteric fracture, underwent Rt hip IM fixation c Dr. Audelia Acton. Pt is WBAT postoperatively. Hb down to 8.1 POD1 v 11.3 preop.    PT Comments  Pt still in recliner on return for BID session, reports sustained nausea for >1 hour, poor meal tolerance. Vitals obtained, noted hypotension compared to earlier in day. Correlated with prolonged upright time, recent ABLA, discussed with DTR in room for caregiver education. DTR educated on safe assist for reposition in chair. Pt reclined to improve pressures as well as nausea. Pain adequately controlled in session, pt still able to perform partial LAQ. Pt left reclined with plans for NA to return for subsequent BP after supine interval. Additional upright activity deferred to avoid exacerbation of presyncope. *subsequent pressure shows improvement with elevation of legs, recline of head. Will monitor H&H, pressure, continue with PT as appropriate next day.    If plan is discharge home, recommend the following: A lot of help with walking and/or transfers   Can travel by private vehicle     No  Equipment Recommendations  None recommended by PT    Recommendations for Other Services       Precautions / Restrictions Precautions Precautions: Fall Restrictions Weight Bearing Restrictions: Yes RLE Weight Bearing: Weight bearing as tolerated     Mobility  Bed Mobility Overal bed mobility: Needs Assistance Bed Mobility:  (assist with posterior scoot transfers, unable to assist due to nausea; assist with recline in chair.)                Transfers Overall transfer level: Needs assistance Equipment used: Rolling walker (2 wheels) Transfers: Sit to/from Stand Sit to Stand: Min  assist                Ambulation/Gait Ambulation/Gait assistance: Min guard Gait Distance (Feet): 7 Feet Assistive device: Rolling walker (2 wheels) Gait Pattern/deviations: WFL(Within Functional Limits)       General Gait Details: weak, slow, safe RW use, sustained dizziness after bout, poor resolution.   Stairs             Wheelchair Mobility     Tilt Bed    Modified Rankin (Stroke Patients Only)       Balance                                            Cognition Arousal/Alertness: Awake/alert Behavior During Therapy: WFL for tasks assessed/performed Overall Cognitive Status: History of cognitive impairments - at baseline (ST memory impairment, repetitive conversation.)                                          Exercises      General Comments        Pertinent Vitals/Pain Pain Assessment Pain Assessment: 0-10 Pain Score: 9  Pain Location: operative hip Pain Intervention(s): Limited activity within patient's tolerance, Monitored during session, Premedicated before session, Patient requesting pain meds-RN notified    Home Living Family/patient expects to be discharged to:: Private residence Living Arrangements: Alone Available Help at Discharge: Family  Type of Home: House           Home Equipment: Agricultural consultant (2 wheels)      Prior Function            PT Goals (current goals can now be found in the care plan section) Acute Rehab PT Goals Patient Stated Goal: regain AMB status at rehab prior to return to home. PT Goal Formulation: With family Time For Goal Achievement: 11/13/22 Potential to Achieve Goals: Fair Progress towards PT goals: Not progressing toward goals - comment    Frequency    BID      PT Plan Current plan remains appropriate    Co-evaluation              AM-PAC PT "6 Clicks" Mobility   Outcome Measure  Help needed turning from your back to your side while in a  flat bed without using bedrails?: A Lot Help needed moving from lying on your back to sitting on the side of a flat bed without using bedrails?: A Lot Help needed moving to and from a bed to a chair (including a wheelchair)?: A Lot Help needed standing up from a chair using your arms (e.g., wheelchair or bedside chair)?: A Lot Help needed to walk in hospital room?: Total Help needed climbing 3-5 steps with a railing? : Total 6 Click Score: 10    End of Session Equipment Utilized During Treatment: Gait belt Activity Tolerance: Treatment limited secondary to medical complications (Comment) (hypotensive) Patient left: in chair;with family/visitor present;with call bell/phone within reach;with chair alarm set Nurse Communication: Mobility status PT Visit Diagnosis: Difficulty in walking, not elsewhere classified (R26.2);Other abnormalities of gait and mobility (R26.89)     Time: 4098-1191 PT Time Calculation (min) (ACUTE ONLY): 10 min  Charges:    $Self Care/Home Management: 8-22 PT General Charges $$ ACUTE PT VISIT: 1 Visit                    3:54 PM, 10/30/22 Rosamaria Lints, PT, DPT Physical Therapist - [facility]  [phone] (ASCOM)     , C 10/30/2022, 3:48 PM

## 2022-10-30 NOTE — Progress Notes (Addendum)
Progress Note    Amber Wolfe  VOZ:[id number] DOB: 08/08/1931  DOA: 10/28/2022 PCP: Armando Gang, FNP      Brief Narrative:    Medical records reviewed and are as summarized below:  Amber Wolfe is a 89 y.o. 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  She said she tripped while walking from her kitchen.  She did not lose consciousness.  She was found to have left hip fracture.     Assessment/Plan:   Principal Problem:   Displaced intertrochanteric fracture of right femur, initial encounter for closed fracture [facility]) Active Problems:   Diabetes mellitus with neuropathy (HCC)   Essential hypertension, benign   Atrial fibrillation (HCC)   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, right (HCC)   Chills (without fever)   Falls   Hypokalemia   Body mass index is 24.21 kg/m.   Right intertrochanteric femur fracture, s/p mechanical fall at home: S/p right hip intramedullary nail on 10/29/2022.  Continue analgesics as needed for pain.  PT and OT.  Follow-up with orthopedic surgeon.   Hypokalemia: Improved   Chills: Improved.  No fever.  Urinalysis was unremarkable.  No acute abnormality on CT chest.  No indication for course of antibiotics at this time.   Paroxysmal atrial fibrillation: Discontinue prophylactic Lovenox and restart Eliquis.  Continue metoprolol   Type II DM with hyperglycemia: Start glargine 10 units nightly.  Patient was taking glargine 12 units nightly at home.  Continue NovoLog as needed for hyperglycemia.   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 Yes; Fluid consistency: Thin  Diet effective now                            Consultants: Orthopedic surgeon  Procedures: Plan for right surgery    Medications:    acetaminophen  500 mg Oral Q6H   atorvastatin  20 mg Oral QHS   docusate sodium  100 mg Oral BID   enoxaparin (LOVENOX) injection  40 mg Subcutaneous Q24H   feeding supplement  237 mL Oral BID BM   insulin aspart  0-9 Units Subcutaneous TID WC   insulin glargine-yfgn  10 Units Subcutaneous QHS   letrozole  2.5 mg Oral QPM   metoprolol tartrate  12.5 mg Oral BID   multivitamin with minerals  1 tablet Oral Daily   Continuous Infusions:  methocarbamol (ROBAXIN) IV       Anti-infectives (From admission, onward)    Start     Dose/Rate Route Frequency Ordered Stop   10/29/22 2300  ceFAZolin (ANCEF) IVPB 2g/100 mL premix        2 g 200 mL/hr over 30 Minutes Intravenous Every 6 hours 10/29/22 1838 10/30/22 0604              Family Communication/Anticipated D/C date and plan/Code Status   DVT prophylaxis: enoxaparin (LOVENOX) injection 40 mg Start: 10/30/22 0800 SCDs Start: 10/29/22 1839 SCDs Start: 10/28/22 1938     Code Status:  Full Code  Family Communication: None Disposition Plan: Plan to discharge to SNF   Status is: Inpatient Remains inpatient appropriate because: Right hip fracture       Subjective:   Interval events noted.  She has no complaints.  No pain in the right hip.  She feels better.  Objective:    Vitals:   10/29/22 2136 10/30/22 0057 10/30/22 0550 10/30/22 0730  BP: (!) 122/51 (!) 109/51 133/61 109/61  Pulse: 80 80 81 86  Resp: 18 20 18 18   Temp:  98.4 F (36.9 C)  98 F (36.7 C)  TempSrc:      SpO2: 100% 100% 100% 100%  Weight:      Height:       No data found.   Intake/Output Summary (Last 24 hours) at 10/30/2022 1421 Last data filed at 10/30/2022 1000 Gross per  24 hour  Intake 1717 ml  Output 1475 ml  Net 242 ml   Filed Weights   10/28/22 1638 10/29/22 1514  Weight: 66 kg 66 kg    Exam:  GEN: NAD SKIN: No rash EYES: No pallor or icterus ENT: MMM CV: Irregular rate and rhythm PULM: CTA B ABD: soft, ND, NT, +BS CNS: AAO x 3, non focal EXT: Mild right hip tenderness.  Dressing on right hip surgical wound is clean, dry and intact        Data Reviewed:   I have personally reviewed following labs and imaging studies:  Labs: Labs show the following:   Basic Metabolic Panel: Recent Labs  Lab 10/28/22 1641 10/30/22 0552  NA 136 136  K 3.4* 4.0  CL 106 106  CO2 22 23  GLUCOSE 243* 262*  BUN 20 21  CREATININE 0.97 0.79  CALCIUM 8.8* 8.1*  MG  --  2.4   GFR Estimated Creatinine Clearance: 42.9 mL/min (by C-G formula based on SCr of 0.79 mg/dL). Liver Function Tests: Recent Labs  Lab 10/28/22 1641  AST 21  ALT 12  ALKPHOS 46  BILITOT 0.9  PROT 6.4*  ALBUMIN 3.8   No results for input(s): "LIPASE", "AMYLASE" in the last 168 hours. No results for input(s): "AMMONIA" in the last 168 hours. Coagulation profile Recent Labs  Lab 10/28/22 1641  INR 1.4*    CBC: Recent Labs  Lab 10/28/22 1641 10/29/22 0451 10/30/22 0552  WBC 11.9* 9.9 9.6  NEUTROABS  --   --  8.3*  HGB 12.8 11.3* 8.1*  HCT 37.4 34.4* 23.0*  MCV 94.9 98.0 93.9  PLT 178 171 126*   Cardiac Enzymes: No results for input(s): "CKTOTAL", "CKMB", "CKMBINDEX", "TROPONINI" in the last 168 hours. BNP (last 3 results) No results for input(s): "PROBNP" in the last 8760 hours. CBG: Recent Labs  Lab 10/30/22 0052 10/30/22 0401 10/30/22 0406 10/30/22 0829 10/30/22 1207  GLUCAP 291* 237* 267* 207* 285*   D-Dimer: No results for input(s): "DDIMER" in the last 72 hours. Hgb A1c: Recent Labs    10/29/22 0218  HGBA1C 6.9*   Lipid Profile: No results for input(s): "CHOL", "HDL", "LDLCALC", "TRIG", "CHOLHDL", "LDLDIRECT" in the last 72  hours. Thyroid function studies: No results for input(s): "TSH", "T4TOTAL", "T3FREE", "THYROIDAB" in the last 72 hours.  Invalid input(s): "FREET3" Anemia work up: No results for input(s): "VITAMINB12", "FOLATE", "FERRITIN", "TIBC", "IRON", "RETICCTPCT" in the last 72 hours. Sepsis Labs: Recent Labs  Lab 10/28/22 1641 10/28/22 2127 10/29/22 0152 10/29/22 0451 10/30/22 0552  WBC 11.9*  --   --  9.9 9.6  LATICACIDVEN  --  2.6* 1.1  --   --     Microbiology Recent Results (from the past 240 hour(s))  Culture, blood (Routine X 2) w Reflex to ID Panel     Status: None (Preliminary result)   Collection Time: 10/28/22  9:17 PM   Specimen: BLOOD  Result Value Ref Range Status   Specimen Description BLOOD RIGHT ANTECUBITAL  Final   Special Requests   Final    BOTTLES DRAWN AEROBIC AND ANAEROBIC Blood Culture adequate volume   Culture   Final    NO GROWTH 2 DAYS Performed at [facility], [address] [place], [place] [address]    Report Status PENDING  Incomplete  Culture, blood (Routine X 2) w Reflex to ID Panel     Status: None (Preliminary result)   Collection Time: 10/28/22  9:27 PM   Specimen: BLOOD  Result Value Ref Range Status   Specimen Description BLOOD LEFT ANTECUBITAL  Final   Special Requests   Final    BOTTLES DRAWN AEROBIC AND ANAEROBIC Blood Culture results may not be optimal due to an excessive volume of blood received in culture bottles   Culture   Final    NO GROWTH 2 DAYS Performed at [facility], [address], [place], [place] [address]    Report Status PENDING  Incomplete  Urine Culture (for pregnant, neutropenic or urologic patients or patients with an indwelling urinary catheter)     Status: None   Collection Time: 10/29/22  2:49 AM   Specimen: Urine, Clean Catch  Result Value Ref Range Status   Specimen Description   Final    URINE, CLEAN CATCH Performed at [facility], [address], [place], [place] [address]     Special Requests   Final    NONE Performed at [facility], [address], [place], [place] [address]    Culture   Final    NO GROWTH Performed at [facility], [address] [place], [place] [address]    Report Status 10/30/2022 FINAL  Final  Surgical PCR screen     Status: None   Collection Time: 10/29/22  6:34 AM   Specimen: Nasal Mucosa; Nasal Swab  Result Value Ref Range Status   MRSA, PCR NEGATIVE NEGATIVE Final   Staphylococcus aureus NEGATIVE NEGATIVE Final    Comment: (NOTE) The Xpert SA Assay (FDA approved for NASAL specimens in patients 51 years of age and older), is one component of a comprehensive surveillance program. It is not intended to diagnose infection nor to guide or monitor treatment. Performed at [facility], [address] [place], [place] [address]     Procedures and diagnostic studies:  DG HIP UNILAT WITH PELVIS 2-3 VIEWS RIGHT  Result Date: 10/29/2022 CLINICAL DATA:  Elective surgery. EXAM: DG HIP (WITH OR WITHOUT PELVIS) 2-3V RIGHT COMPARISON:  Preoperative radiograph yesterday FINDINGS: Four fluoroscopic spot views of the right hip obtained in the operating room. Intramedullary nail with trans trochanteric and distal locking screw fixation traverse intertrochanteric femur fracture. Fluoroscopy dose 17.71 mGy, fluoroscopy time 1 minutes 38 seconds. IMPRESSION: Intraoperative fluoroscopy during right hip ORIF. Electronically Signed   By: Narda Rutherford M.D.   On: 10/29/2022 18:26   DG C-Arm 1-60 Min-No Report  Result Date: 10/29/2022 Fluoroscopy was utilized by the requesting physician.  No radiographic interpretation.   CT Angio Chest Pulmonary Embolism (PE) W or WO Contrast  Result Date: 10/28/2022 CLINICAL DATA:  Fall EXAM: CT ANGIOGRAPHY CHEST  WITH CONTRAST TECHNIQUE: Multidetector CT imaging of the chest was performed using the standard protocol during bolus administration of intravenous contrast. Multiplanar CT  image reconstructions and MIPs were obtained to evaluate the vascular anatomy. RADIATION DOSE REDUCTION: This exam was performed according to the departmental dose-optimization program which includes automated exposure control, adjustment of the mA and/or kV according to patient size and/or use of iterative reconstruction technique. CONTRAST:  75mL OMNIPAQUE IOHEXOL 350 MG/ML SOLN COMPARISON:  Chest x-ray 04/29/2022, chest CT 12/06/2017 FINDINGS: Cardiovascular: Satisfactory opacification of the pulmonary arteries to the segmental level. No evidence of pulmonary embolism. Moderate aortic atherosclerosis. No aneurysm. Coronary vascular calcification. Borderline cardiac enlargement. No pericardial effusion Mediastinum/Nodes: Midline trachea. No thyroid mass. No suspicious lymph nodes. Small moderate hiatal hernia Lungs/Pleura: Progression of reticular disease at the apices consistent with fibrosis. Diffuse bilateral mostly subpleural reticular change slightly progressed. No acute airspace disease, pleural effusion or pneumothorax Upper Abdomen: No acute finding Musculoskeletal: No acute or suspicious osseous abnormality. Review of the MIP images confirms the above findings. IMPRESSION: 1. Negative for acute pulmonary embolus or aortic dissection. 2. Progression of fibrotic changes in the lungs. 3. Aortic atherosclerosis. Aortic Atherosclerosis (ICD10-I70.0). Electronically Signed   By: Jasmine Pang M.D.   On: 10/28/2022 21:52   DG FEMUR, MIN 2 VIEWS RIGHT  Result Date: 10/28/2022 CLINICAL DATA:  Fall with right hip fracture EXAM: RIGHT FEMUR 2 VIEWS COMPARISON:  10/28/2022 FINDINGS: Only the mid to distal femur is included. There is no fracture or malalignment. Moderate severe tricompartment arthritis of the knee, worst involving the medial joint space. Vascular calcifications IMPRESSION: No acute osseous abnormality. Electronically Signed   By: Jasmine Pang M.D.   On: 10/28/2022 21:32   CT Head Wo  Contrast  Result Date: 10/28/2022 CLINICAL DATA:  Head trauma, intracranial venous injury suspected; Facial trauma, blunt EXAM: CT HEAD WITHOUT CONTRAST CT CERVICAL SPINE WITHOUT CONTRAST TECHNIQUE: Multidetector CT imaging of the head and cervical spine was performed following the standard protocol without intravenous contrast. Multiplanar CT image reconstructions of the cervical spine were also generated. RADIATION DOSE REDUCTION: This exam was performed according to the departmental dose-optimization program which includes automated exposure control, adjustment of the mA and/or kV according to patient size and/or use of iterative reconstruction technique. COMPARISON:  CT Head and C Spine 12/06/17 FINDINGS: CT HEAD FINDINGS Brain: No evidence of acute infarction, hemorrhage, hydrocephalus, extra-axial collection or mass lesion/mass effect. Sequela of moderate chronic microvascular ischemic change. 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 Vascular: No hyperdense vessel or unexpected calcification. Skull: Normal. Negative for fracture or focal lesion. Sinuses/Orbits: No middle ear or mastoid effusion. Paranasal sinuses are clear. Bilateral lens replacement. Orbits are otherwise unremarkable. Other: None. CT CERVICAL SPINE FINDINGS Alignment: Grade 1 anterolisthesis of C3 on C4 and C7 on T1. Skull base and vertebrae: No acute fracture. No primary bone lesion or focal pathologic process. Soft tissues and spinal canal: No prevertebral fluid or swelling. No visible canal hematoma. Disc levels:  No evidence of high-grade spinal canal stenosis. Upper chest: Bilateral subpleural reticulations can be seen in the setting of chronic interstitial lung disease. Other: Calcified atherosclerotic plaque of the carotid bifurcations bilaterally. IMPRESSION: 1. No CT evidence of intracranial injury. 2. No acute fracture or traumatic malalignment of the cervical spine. Electronically Signed   By: Lorenza Cambridge M.D.   On:  10/28/2022 17:41   CT Cervical Spine Wo Contrast  Result Date: 10/28/2022 CLINICAL DATA:  Head trauma, intracranial venous injury suspected; Facial  trauma, blunt EXAM: CT HEAD WITHOUT CONTRAST CT CERVICAL SPINE WITHOUT CONTRAST TECHNIQUE: Multidetector CT imaging of the head and cervical spine was performed following the standard protocol without intravenous contrast. Multiplanar CT image reconstructions of the cervical spine were also generated. RADIATION DOSE REDUCTION: This exam was performed according to the departmental dose-optimization program which includes automated exposure control, adjustment of the mA and/or kV according to patient size and/or use of iterative reconstruction technique. COMPARISON:  CT Head and C Spine 12/06/17 FINDINGS: CT HEAD FINDINGS Brain: No evidence of acute infarction, hemorrhage, hydrocephalus, extra-axial collection or mass lesion/mass effect. Sequela of moderate chronic microvascular ischemic change. 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 Vascular: No hyperdense vessel or unexpected calcification. Skull: Normal. Negative for fracture or focal lesion. Sinuses/Orbits: No middle ear or mastoid effusion. Paranasal sinuses are clear. Bilateral lens replacement. Orbits are otherwise unremarkable. Other: None. CT CERVICAL SPINE FINDINGS Alignment: Grade 1 anterolisthesis of C3 on C4 and C7 on T1. Skull base and vertebrae: No acute fracture. No primary bone lesion or focal pathologic process. Soft tissues and spinal canal: No prevertebral fluid or swelling. No visible canal hematoma. Disc levels:  No evidence of high-grade spinal canal stenosis. Upper chest: Bilateral subpleural reticulations can be seen in the setting of chronic interstitial lung disease. Other: Calcified atherosclerotic plaque of the carotid bifurcations bilaterally. IMPRESSION: 1. No CT evidence of intracranial injury. 2. No acute fracture or traumatic malalignment of the cervical spine. Electronically  Signed   By: Lorenza Cambridge M.D.   On: 10/28/2022 17:41   DG Chest 1 View  Result Date: 10/28/2022 CLINICAL DATA:  Right hip deformity.  Trauma EXAM: CHEST  1 VIEW COMPARISON:  X-ray 12/06/2017 FINDINGS: Calcified aorta. Film is rotated to the left. No consolidation, pneumothorax or effusion. No edema. Slight thickening along the minor fissure. Questionable nodule left midlung overlying the posterior aspect of the left seventh rib. Degenerative changes of the spine. IMPRESSION: Small nodular density along the left midthorax. This could be summation of shadow. Recommend standard two-view x-ray to further delineate or CT when appropriate Electronically Signed   By: Karen Kays M.D.   On: 10/28/2022 17:35   DG Hip Unilat W or Wo Pelvis 2-3 Views Right  Result Date: 10/28/2022 CLINICAL DATA:  Pain after fall EXAM: DG HIP (WITH OR WITHOUT PELVIS) 3V RIGHT COMPARISON:  None Available. FINDINGS: Comminuted foreshortened and angulated intertrochanteric right hip fracture. No additional fracture or dislocation. Preserved joint spaces. Osteopenia. Note is made of prominent colonic stool. IMPRESSION: Comminuted foreshortened and angulated intertrochanteric right hip fracture. Electronically Signed   By: Karen Kays M.D.   On: 10/28/2022 17:33               LOS: 2 days      [facility]   Pager on [email or web address]. If 7PM-7AM, please contact night-coverage at [email or web address]     10/30/2022, 2:21 PM

## 2022-10-31 DIAGNOSIS — S72141A Displaced intertrochanteric fracture of right femur, initial encounter for closed fracture: Secondary | ICD-10-CM | POA: Diagnosis not present

## 2022-10-31 DIAGNOSIS — D62 Acute posthemorrhagic anemia: Secondary | ICD-10-CM | POA: Diagnosis not present

## 2022-10-31 LAB — GLUCOSE, CAPILLARY
Glucose-Capillary: 192 mg/dL — ABNORMAL HIGH (ref 70–99)
Glucose-Capillary: 222 mg/dL — ABNORMAL HIGH (ref 70–99)
Glucose-Capillary: 212 mg/dL — ABNORMAL HIGH (ref 70–99)
Glucose-Capillary: 163 mg/dL — ABNORMAL HIGH (ref 70–99)
Glucose-Capillary: 175 mg/dL — ABNORMAL HIGH (ref 70–99)

## 2022-10-31 LAB — PREPARE RBC (CROSSMATCH)

## 2022-10-31 MED ORDER — SODIUM CHLORIDE 0.9% IV SOLUTION: Freq: Once | INTRAVENOUS | Status: AC

## 2022-10-31 MED ORDER — INSULIN GLARGINE-YFGN 100 UNIT/ML ~~LOC~~ SOLN
12.0000 [IU] | Freq: Every day | SUBCUTANEOUS | Status: DC
  Administered 2022-10-31 – 2022-11-02 (×3): 12 [IU] via SUBCUTANEOUS
  Filled 2022-10-31 (×4): qty 0.12

## 2022-10-31 NOTE — Progress Notes (Addendum)
Progress Note    Amber Wolfe  IEP:[id number] DOB: [birth year]  DOA: 10/28/2022 PCP: Armando Gang, FNP      Brief Narrative:    Medical records reviewed and are as summarized below:  Amber Wolfe is a 89 y.o. 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  She said she tripped while walking from her kitchen.  She did not lose consciousness.  She was found to have left hip fracture.     Assessment/Plan:   Principal Problem:   Displaced intertrochanteric fracture of right femur, initial encounter for closed fracture [facility]) Active Problems:   Diabetes mellitus with neuropathy (HCC)   Essential hypertension, benign   Atrial fibrillation (HCC)   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, right (HCC)   Chills (without fever)   Falls   Hypokalemia   Acute blood loss as cause of postoperative anemia   Body mass index is 24.21 kg/m.   Right intertrochanteric femur fracture, s/p mechanical fall at home: S/p right hip intramedullary nail on 10/29/2022.  Continue analgesics as needed for pain.  PT recommended discharge to SNF   Acute blood loss anemia: Hemoglobin is down to 7.1.  Hemoglobin was 12.8 on admission.  Transfuse 1 units of packed red blood cells.  Risks,  benefits and alternatives of blood transfusion were discussed with the patient.  She is agreeable to blood transfusion.  Monitor H&H.   Hypokalemia: Improved   Chills: Improved.  No fever.  Urinalysis was unremarkable.  No acute abnormality on CT chest.  No indication for course of antibiotics at this time.   Paroxysmal atrial fibrillation: Continue metoprolol and Eliquis   Type II DM with hyperglycemia: Increase insulin glargine from 10 units  to 12 units nightly.  Patient was taking glargine 12 units nightly at home.  Continue NovoLog as needed for hyperglycemia.   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 Yes; Fluid consistency: Thin  Diet effective now                            Consultants: Orthopedic surgeon  Procedures: S/p right hip intramedullary nail on 10/29/2022    Medications:    apixaban  5 mg Oral BID   atorvastatin  20 mg Oral QHS   docusate sodium  100 mg Oral BID   feeding supplement  237 mL Oral BID BM   insulin aspart  0-9 Units Subcutaneous TID WC   insulin glargine-yfgn  10 Units Subcutaneous QHS   letrozole  2.5 mg Oral QPM   metoprolol tartrate  12.5 mg Oral BID   multivitamin with minerals  1 tablet Oral Daily   Continuous Infusions:  methocarbamol (ROBAXIN) IV       Anti-infectives (From admission, onward)    Start     Dose/Rate Route Frequency Ordered Stop   10/29/22 2300  ceFAZolin (ANCEF) IVPB 2g/100 mL premix        2 g 200 mL/hr over 30 Minutes Intravenous Every 6 hours  10/29/22 1838 10/30/22 0604              Family Communication/Anticipated D/C date and plan/Code Status   DVT prophylaxis: SCDs Start: 10/29/22 1839 apixaban (ELIQUIS) tablet 5 mg     Code Status: Full Code  Family Communication: None Disposition Plan: Plan to discharge to SNF   Status is: Inpatient Remains inpatient appropriate because: Right hip fracture       Subjective:   She complains of dizziness and headache.  [name], PT, was at the bedside.  Objective:    Vitals:   10/30/22 2046 10/30/22 2324 10/31/22 1117 10/31/22 1143  BP: 104/79 (!) 107/52 (!) 101/47 (!) 102/50  Pulse: 89 84 84 80  Resp: (!) 24 20 17 16   Temp: 98.2 F (36.8 C) 98.7 F (37.1 C) 98.6 F (37  C) 98.8 F (37.1 C)  TempSrc:  Oral Oral   SpO2: 100% 100% 100% 99%  Weight:      Height:       No data found.   Intake/Output Summary (Last 24 hours) at 10/31/2022 1257 Last data filed at 10/31/2022 1000 Gross per 24 hour  Intake 120 ml  Output --  Net 120 ml   Filed Weights   10/28/22 1638 10/29/22 1514  Weight: 66 kg 66 kg    Exam:  GEN: NAD SKIN: Warm and dry EYES: Pale but anicteric ENT: MMM CV: RRR PULM: CTA B ABD: soft, ND, NT, +BS CNS: AAO x 3, non focal EXT: No edema or tenderness.  Dressing on right hip surgical wound is clean, dry and intact       Data Reviewed:   I have personally reviewed following labs and imaging studies:  Labs: Labs show the following:   Basic Metabolic Panel: Recent Labs  Lab 10/28/22 1641 10/30/22 0552 10/31/22 0359  NA 136 136 135  K 3.4* 4.0 4.0  CL 106 106 107  CO2 22 23 23   GLUCOSE 243* 262* 204*  BUN 20 21 29*  CREATININE 0.97 0.79 0.80  CALCIUM 8.8* 8.1* 8.3*  MG  --  2.4  --    GFR Estimated Creatinine Clearance: 42.9 mL/min (by C-G formula based on SCr of 0.8 mg/dL). Liver Function Tests: Recent Labs  Lab 10/28/22 1641  AST 21  ALT 12  ALKPHOS 46  BILITOT 0.9  PROT 6.4*  ALBUMIN 3.8   No results for input(s): "LIPASE", "AMYLASE" in the last 168 hours. No results for input(s): "AMMONIA" in the last 168 hours. Coagulation profile Recent Labs  Lab 10/28/22 1641  INR 1.4*    CBC: Recent Labs  Lab 10/28/22 1641 10/29/22 0451 10/30/22 0552 10/31/22 0359  WBC 11.9* 9.9 9.6 9.9  NEUTROABS  --   --  8.3*  --   HGB 12.8 11.3* 8.1* 7.1*  HCT 37.4 34.4* 23.0* 20.8*  MCV 94.9 98.0 93.9 95.9  PLT 178 171 126* 143*   Cardiac Enzymes: No results for input(s): "CKTOTAL", "CKMB", "CKMBINDEX", "TROPONINI" in the last 168 hours. BNP (last 3 results) No results for input(s): "PROBNP" in the last 8760 hours. CBG: Recent Labs  Lab 10/30/22 1942 10/30/22 2326 10/31/22 0420 10/31/22 0741  10/31/22 1140  GLUCAP 235* 202* 192* 222* 212*   D-Dimer: No results for input(s): "DDIMER" in the last 72 hours. Hgb A1c: Recent Labs    10/29/22 0218  HGBA1C 6.9*   Lipid Profile: No results for input(s): "CHOL", "HDL", "LDLCALC", "TRIG", "CHOLHDL", "LDLDIRECT" in the last 72 hours. Thyroid function studies: No  results for input(s): "TSH", "T4TOTAL", "T3FREE", "THYROIDAB" in the last 72 hours.  Invalid input(s): "FREET3" Anemia work up: No results for input(s): "VITAMINB12", "FOLATE", "FERRITIN", "TIBC", "IRON", "RETICCTPCT" in the last 72 hours. Sepsis Labs: Recent Labs  Lab 10/28/22 1641 10/28/22 2127 10/29/22 0152 10/29/22 0451 10/30/22 0552 10/31/22 0359  WBC 11.9*  --   --  9.9 9.6 9.9  LATICACIDVEN  --  2.6* 1.1  --   --   --     Microbiology Recent Results (from the past 240 hour(s))  Culture, blood (Routine X 2) w Reflex to ID Panel     Status: None (Preliminary result)   Collection Time: 10/28/22  9:17 PM   Specimen: BLOOD  Result Value Ref Range Status   Specimen Description BLOOD RIGHT ANTECUBITAL  Final   Special Requests   Final    BOTTLES DRAWN AEROBIC AND ANAEROBIC Blood Culture adequate volume   Culture   Final    NO GROWTH 3 DAYS Performed at [facility], [address] [place], [place] [address]    Report Status PENDING  Incomplete  Culture, blood (Routine X 2) w Reflex to ID Panel     Status: None (Preliminary result)   Collection Time: 10/28/22  9:27 PM   Specimen: BLOOD  Result Value Ref Range Status   Specimen Description BLOOD LEFT ANTECUBITAL  Final   Special Requests   Final    BOTTLES DRAWN AEROBIC AND ANAEROBIC Blood Culture results may not be optimal due to an excessive volume of blood received in culture bottles   Culture   Final    NO GROWTH 3 DAYS Performed at [facility], [address], [place], [place] [address]    Report Status PENDING  Incomplete  Urine Culture (for pregnant, neutropenic or  urologic patients or patients with an indwelling urinary catheter)     Status: None   Collection Time: 10/29/22  2:49 AM   Specimen: Urine, Clean Catch  Result Value Ref Range Status   Specimen Description   Final    URINE, CLEAN CATCH Performed at [facility], [address] [place], [place] [address]    Special Requests   Final    NONE Performed at [facility], [address], [place], [place] [address]    Culture   Final    NO GROWTH Performed at [facility], [address] [place], [place] [address]    Report Status 10/30/2022 FINAL  Final  Surgical PCR screen     Status: None   Collection Time: 10/29/22  6:34 AM   Specimen: Nasal Mucosa; Nasal Swab  Result Value Ref Range Status   MRSA, PCR NEGATIVE NEGATIVE Final   Staphylococcus aureus NEGATIVE NEGATIVE Final    Comment: (NOTE) The Xpert SA Assay (FDA approved for NASAL specimens in patients 67 years of age and older), is one component of a comprehensive surveillance program. It is not intended to diagnose infection nor to guide or monitor treatment. Performed at [facility], [address] [place], [place] [address]     Procedures and diagnostic studies:  DG HIP UNILAT WITH PELVIS 2-3 VIEWS RIGHT  Result Date: 10/29/2022 CLINICAL DATA:  Elective surgery. EXAM: DG HIP (WITH OR WITHOUT PELVIS) 2-3V RIGHT COMPARISON:  Preoperative radiograph yesterday FINDINGS: Four fluoroscopic spot views of the right hip obtained in the operating room. Intramedullary nail with trans trochanteric and distal locking screw fixation traverse intertrochanteric femur fracture. Fluoroscopy dose 17.71 mGy, fluoroscopy  time 1 minutes 38 seconds. IMPRESSION: Intraoperative fluoroscopy during right hip ORIF. Electronically Signed   By: Narda Rutherford M.D.   On: 10/29/2022 18:26   DG C-Arm 1-60 Min-No Report  Result Date: 10/29/2022 Fluoroscopy was utilized by the requesting physician.  No radiographic  interpretation.               LOS: 3 days      [facility]   Pager on [email or web address]. If 7PM-7AM, please contact night-coverage at [email or web address]     10/31/2022, 12:57 PM

## 2022-10-31 NOTE — Progress Notes (Signed)
Subjective: 2 Days Post-Op Procedure(s) (LRB): INTRAMEDULLARY (IM) NAIL INTERTROCHANTERIC (Right) Patient reports pain as mild but does report increased pain with PT yesterday.  Patient is well, Hg 7.1 this morning but denies symptoms while in bed. Plan is likely for discharge to SNF. Denies any CP, SOB, ABD pain.  Objective: Vital signs in last 24 hours: Temp:  [98.2 F (36.8 C)-98.7 F (37.1 C)] 98.7 F (37.1 C) (08/02 2324) Pulse Rate:  [70-92] 84 (08/02 2324) Resp:  [16-24] 20 (08/02 2324) BP: (96-115)/(48-79) 107/52 (08/02 2324) SpO2:  [100 %] 100 % (08/02 2324)  Intake/Output from previous day: 08/02 0701 - 08/03 0700 In: 477 [P.O.:240] Out: -  Intake/Output this shift: No intake/output data recorded.  Recent Labs    10/28/22 1641 10/29/22 0451 10/30/22 0552 10/31/22 0359  HGB 12.8 11.3* 8.1* 7.1*   Recent Labs    10/30/22 0552 10/31/22 0359  WBC 9.6 9.9  RBC 2.45* 2.17*  HCT 23.0* 20.8*  PLT 126* 143*   Recent Labs    10/30/22 0552 10/31/22 0359  NA 136 135  K 4.0 4.0  CL 106 107  CO2 23 23  BUN 21 29*  CREATININE 0.79 0.80  GLUCOSE 262* 204*  CALCIUM 8.1* 8.3*   Recent Labs    10/28/22 1641  INR 1.4*    EXAM General - Patient is Alert, Appropriate, and Oriented Extremity - Neurovascular intact Sensation intact distally Intact pulses distally Dorsiflexion/Plantar flexion intact No cellulitis present Compartment soft Dressing - dressing C/D/I and no drainage Motor Function - intact, moving foot and toes well on exam.   Past Medical History:  Diagnosis Date   Arthritis    In hands   Atrial flutter (HCC)    a. s/p successful TEE/DCCV 07/31/2015; b. 01/2016 Amio added for Afib; c. 11/2017 Amio d/c'd 2/2 bradycardia; d. 12/2018 Back in aflutter-->Zio: Avg HR 74 (41-176) 100% Afl->bb added back; e. CHADS2VASc => 6 (CHF, HTN, age x 2, DM, female)-->eliquis 2.5 bid.   Bradycardia    a. 11/2017 noted during hospitalization @ [facility]-->amio d/c'd  by cardiology.   Cataract    Chronic combined systolic (congestive) and diastolic (congestive) heart failure (HCC)    a. 06/2015 Echo: EF 40%, basal HK, nl WM of mid and apical segments, mild to mod MR/TR; b. TEE 07/31/2015: nl LV systolic function, mild to mod MR, trivial TR; c. 06/2017 Echo: EF 55-60%, mild LVH, Gr2 DD, triv AI, mild MR, mod dil LA, mildly dil RA, mild to mod TR. PASP 35-32mmHg; d. 01/2019 Echo: Ef 55-60%, mod dil LA mildly dil RA. Mild TR. Trace MR.   Diabetes mellitus without complication (HCC)    Type II   GERD (gastroesophageal reflux disease)    Glaucoma    Right Eye   Hematoma of abdominal wall    a. 11/2017 Fall-->R flank hematoma w/ anemia req PRBC. Xarelto dc'd at that time.   History of SCC (squamous cell carcinoma) of skin 08/20/2020   left forearm EDC   Hyperlipidemia    Hypertension    Moderate mitral regurgitation    a. see echo from 06/2015 and TEE 07/2015; b. 06/2017 Echo: Mild MR.   PAF (paroxysmal atrial fibrillation) (HCC)    a.  01/2016 Ad[facility] to [facility] with PAF -->amio added (later dc'd 11/2017 2/2 bradycardia).   Right renal mass    a. 11/2017 CT Abd [facility]): 3.4cm R upper pole renal mass concerning for renal cell carcinoma-->outpt f/u w/ urology.   Assessment/Plan:   2  Days Post-Op Procedure(s) (LRB): INTRAMEDULLARY (IM) NAIL INTERTROCHANTERIC (Right) Principal Problem:   Displaced intertrochanteric fracture of right femur, initial encounter for closed fracture [facility]) Active Problems:   Diabetes mellitus with neuropathy (HCC)   Essential hypertension, benign   Atrial fibrillation (HCC)   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, right (HCC)   Chills (without fever)   Falls   Hypokalemia  Estimated body mass index is 24.21 kg/m as calculated from the following:   Height as of this encounter: 5\' 5"  (1.651 m).   Weight as of this encounter: 66 kg. Advance diet Up with therapy Pain well controlled this morning. Acute post op blood loss  anemia - Hgb 7.1, transfusion has been ordered for patient. CM to assist with discharge to home with HHPT vs. SNF pending continued progress with PT.  Up with therapy today pending timing of transfusion.  Follow up with [facility] in 2 weeks Lovenox 40 mg SQ daily x 14 days  DVT Prophylaxis - Lovenox, TED hose, and SCDS Weight-Bearing as tolerated to right leg  J. Horris Latino, PA-C [facility] 10/31/2022, 8:46 AM

## 2022-10-31 NOTE — Evaluation (Signed)
Occupational Therapy Evaluation Patient Details Name: Amber Wolfe MRN: [id number] DOB: Jul 07, 1931 Today's Date: 10/31/2022   History of Present Illness Amber Wolfe is an 89yoF who comes to [facility] 10/28/22 after fall at home, tripped, hit head, imaging revealing of right intertrochanteric fracture, underwent Rt hip IM fixation c Dr. Audelia Acton. Pt is WBAT postoperatively. Hb down to 8.1 POD1 v 11.3 preop.   Clinical Impression   Patient presenting with decreased Ind in self care, balance, functional mobility/transfers, endurance, and safety awareness. Patient reports being Ind and living at home alone. Per chart review, pt has poor compliance with using recommended AD for safety. She needs some assistance for IADLs and has had falls. Plan for pt to get blood transfusion this morning but she is agreeable to OT evaluation. Pt performs bed mobility with mod A for LEs and trunk support to EOB. Pt sitting with supervision and utilizes incentive spirometer and grooming tasks with set up A. Patient declines attempts to stand secondary to dizziness. RN arrives with medication and pt takes while seated EOB and then returns to supine for blood transfusion to start. Call bell and all needed items within reach. Patient will benefit from acute OT to increase overall independence in the areas of ADLs, functional mobility, and safety awareness in order to safely discharge.     Recommendations for follow up therapy are one component of a multi-disciplinary discharge planning process, led by the attending physician.  Recommendations may be updated based on patient status, additional functional criteria and insurance authorization.   Assistance Recommended at Discharge Frequent or constant Supervision/Assistance  Patient can return home with the following A lot of help with walking and/or transfers;A lot of help with bathing/dressing/bathroom;Assistance with cooking/housework;Assist for transportation;Help with stairs or  ramp for entrance    Functional Status Assessment  Patient has had a recent decline in their functional status and demonstrates the ability to make significant improvements in function in a reasonable and predictable amount of time.  Equipment Recommendations  Other (comment) (defer to next venue of care)       Precautions / Restrictions Precautions Precautions: Fall Restrictions Weight Bearing Restrictions: Yes RLE Weight Bearing: Weight bearing as tolerated      Mobility Bed Mobility Overal bed mobility: Needs Assistance Bed Mobility: Supine to Sit, Sit to Supine     Supine to sit: Mod assist Sit to supine: Mod assist   General bed mobility comments: Mod A to manage LEs and trunk support    Transfers                   General transfer comment: pt refused      Balance Overall balance assessment: Needs assistance Sitting-balance support: Feet supported Sitting balance-Leahy Scale: Good                                     ADL either performed or assessed with clinical judgement   ADL Overall ADL's : Needs assistance/impaired     Grooming: Sitting;Supervision/safety;Set up                                 General ADL Comments: set up - supervision for UB self care seated with mod - max A likely for LB self care.     Vision Patient Visual Report: No change from baseline  Pertinent Vitals/Pain Pain Assessment Pain Assessment: Faces Pain Score: 5  Pain Descriptors / Indicators: Discomfort, Guarding Pain Intervention(s): Monitored during session, Repositioned, RN gave pain meds during session     Hand Dominance Right   Extremity/Trunk Assessment Upper Extremity Assessment Upper Extremity Assessment: Generalized weakness   Lower Extremity Assessment Lower Extremity Assessment: Generalized weakness       Communication Communication Communication: No difficulties   Cognition Arousal/Alertness:  Awake/alert Behavior During Therapy: WFL for tasks assessed/performed Overall Cognitive Status: History of cognitive impairments - at baseline                                                  Home Living Family/patient expects to be discharged to:: Private residence Living Arrangements: Alone Available Help at Discharge: Family;Available PRN/intermittently Type of Home: House             Bathroom Shower/Tub: Tub/shower unit         Home Equipment: Agricultural consultant (2 wheels)          Prior Functioning/Environment Prior Level of Function : Needs assist             Mobility Comments: mostly home bound, DTRs assist; limited compliance with RW at baseline per family ADLs Comments: Pt reprots assistance with ADL tasks and Ind in self care        OT Problem List: Decreased strength;Decreased range of motion;Decreased cognition;Decreased activity tolerance;Decreased safety awareness;Impaired balance (sitting and/or standing);Decreased knowledge of use of DME or AE;Pain      OT Treatment/Interventions: Self-care/ADL training;Therapeutic exercise;Therapeutic activities;Energy conservation;DME and/or AE instruction;Patient/family education;Balance training    OT Goals(Current goals can be found in the care plan section) Acute Rehab OT Goals Patient Stated Goal: to go home OT Goal Formulation: With patient Time For Goal Achievement: 11/14/22 Potential to Achieve Goals: Fair ADL Goals Pt Will Perform Grooming: with supervision;standing Pt Will Perform Lower Body Dressing: with min guard assist;sit to/from stand Pt Will Transfer to Toilet: with min guard assist;ambulating Pt Will Perform Toileting - Clothing Manipulation and hygiene: with min guard assist;sit to/from stand  OT Frequency: Min 1X/week       AM-PAC OT "6 Clicks" Daily Activity     Outcome Measure Help from another person eating meals?: None Help from another person taking care of  personal grooming?: A Little Help from another person toileting, which includes using toliet, bedpan, or urinal?: A Lot Help from another person bathing (including washing, rinsing, drying)?: A Lot Help from another person to put on and taking off regular upper body clothing?: A Little Help from another person to put on and taking off regular lower body clothing?: A Lot 6 Click Score: 16   End of Session Nurse Communication: Mobility status  Activity Tolerance: Patient tolerated treatment well Patient left: in bed;with call bell/phone within reach;with bed alarm set  OT Visit Diagnosis: Unsteadiness on feet (R26.81);Repeated falls (R29.6);Muscle weakness (generalized) (M62.81)                Time: 1610-9604 OT Time Calculation (min): 15 min Charges:  OT General Charges $OT Visit: 1 Visit OT Evaluation $OT Eval Moderate Complexity: 1 Mod OT Treatments $Therapeutic Activity: 8-22 mins  Jackquline Denmark, MS, OTR/L , CBIS ascom [phone]  10/31/22, 11:22 AM

## 2022-10-31 NOTE — Progress Notes (Signed)
Physical Therapy Treatment Patient Details Name: Amber Wolfe MRN: [id number] DOB: May 28, 1931 Today's Date: 10/31/2022   History of Present Illness Amber Wolfe is an 89yoF who comes to [facility] 10/28/22 after fall at home, tripped, hit head, imaging revealing of right intertrochanteric fracture, underwent Rt hip IM fixation c Dr. Audelia Acton. Pt is WBAT postoperatively. Hb down to 8.1 POD1 v 11.3 preop.    PT Comments  Pt limited today performing weak, slow, safe RW use with transfers and stepping to/from [facility], sustained dizziness after bout, poor resolution. Supine resting BP 99/48, seated 98/52, RN notified; pt had low Hemoglobin and is scheduled for a transfusion today.  Pt required Mod A for bed mobility for LEs management.  Continued PT will assist pt towards LE strengthening, balance, and greater safety and independence with mobility.   If plan is discharge home, recommend the following: A lot of help with walking and/or transfers   Can travel by private vehicle     No  Equipment Recommendations  None recommended by PT    Recommendations for Other Services       Precautions / Restrictions Precautions Precautions: Fall Restrictions Weight Bearing Restrictions: Yes RLE Weight Bearing: Weight bearing as tolerated     Mobility  Bed Mobility Overal bed mobility: Needs Assistance Bed Mobility: Supine to Sit, Sit to Supine     Supine to sit: Mod assist Sit to supine: Mod assist   General bed mobility comments: Mod A to manage LEs due to R LE pain.    Transfers Overall transfer level: Needs assistance Equipment used: Rolling walker (2 wheels) Transfers: Sit to/from Stand, Bed to chair/wheelchair/BSC Sit to Stand: Min assist   Step pivot transfers: Min assist       General transfer comment: cues for safe handplacement.    Ambulation/Gait Ambulation/Gait assistance: Min guard Gait Distance (Feet): 4 Feet (from bed <> BSC) Assistive device: Rolling walker (2 wheels) Gait  Pattern/deviations: WFL(Within Functional Limits)       General Gait Details: weak, slow, safe RW use, sustained dizziness after bout, poor resolution.  Supine resting BP 99/48, seated 98/52, RN notified; pt had low Hemoglobin and is scheduled for a transfusion today.   Stairs             Wheelchair Mobility     Tilt Bed    Modified Rankin (Stroke Patients Only)       Balance Overall balance assessment: Independent, Needs assistance   Sitting balance-Leahy Scale: Fair Sitting balance - Comments: reports dizziness.   Standing balance support: Reliant on assistive device for balance, No upper extremity supported Standing balance-Leahy Scale: Fair Standing balance comment: reports dizziness but able to stand at commode for hygiene, was steady.                            Cognition Arousal/Alertness: Awake/alert Behavior During Therapy: WFL for tasks assessed/performed Overall Cognitive Status: History of cognitive impairments - at baseline (ST memory impairment, repetitive conversation.)                                 General Comments: required repetitive cues for one step commands, possibly distracted from dizziness pt is scheduled  to have a blood transfusion today.        Exercises      General Comments        Pertinent Vitals/Pain Pain Assessment Pain Assessment:  Faces Faces Pain Scale: Hurts little more Pain Descriptors / Indicators: Grimacing Pain Intervention(s): Monitored during session, Repositioned    Home Living                          Prior Function            PT Goals (current goals can now be found in the care plan section) Acute Rehab PT Goals Patient Stated Goal: regain AMB status at rehab prior to return to home. PT Goal Formulation: With family Time For Goal Achievement: 11/13/22 Potential to Achieve Goals: Fair Progress towards PT goals: Progressing toward goals    Frequency     BID      PT Plan Current plan remains appropriate    Co-evaluation              AM-PAC PT "6 Clicks" Mobility   Outcome Measure  Help needed turning from your back to your side while in a flat bed without using bedrails?: A Lot Help needed moving from lying on your back to sitting on the side of a flat bed without using bedrails?: A Lot Help needed moving to and from a bed to a chair (including a wheelchair)?: A Lot Help needed standing up from a chair using your arms (e.g., wheelchair or bedside chair)?: A Lot Help needed to walk in hospital room?: A Lot Help needed climbing 3-5 steps with a railing? : Total 6 Click Score: 11    End of Session Equipment Utilized During Treatment: Gait belt Activity Tolerance: Treatment limited secondary to medical complications (Comment) (low BP and hemoglobin) Patient left: with call bell/phone within reach;in bed;with bed alarm set Nurse Communication: Mobility status PT Visit Diagnosis: Difficulty in walking, not elsewhere classified (R26.2);Other abnormalities of gait and mobility (R26.89)     Time: 4098-1191 PT Time Calculation (min) (ACUTE ONLY): 28 min  Charges:    $Therapeutic Activity: 23-37 mins PT General Charges $$ ACUTE PT VISIT: 1 Visit                     Hortencia Conradi, PTA  10/31/22, 10:28 AM

## 2022-11-01 DIAGNOSIS — S72141A Displaced intertrochanteric fracture of right femur, initial encounter for closed fracture: Secondary | ICD-10-CM | POA: Diagnosis not present

## 2022-11-01 LAB — CBC
HCT: 26.5 % — ABNORMAL LOW (ref 36.0–46.0)
Hemoglobin: 9.1 g/dL — ABNORMAL LOW (ref 12.0–15.0)
MCH: 31.6 pg (ref 26.0–34.0)
MCHC: 34.3 g/dL (ref 30.0–36.0)
MCV: 92 fL (ref 80.0–100.0)
Platelets: 143 10*3/uL — ABNORMAL LOW (ref 150–400)
RBC: 2.88 MIL/uL — ABNORMAL LOW (ref 3.87–5.11)
RDW: 17.3 % — ABNORMAL HIGH (ref 11.5–15.5)
WBC: 6.5 10*3/uL (ref 4.0–10.5)
nRBC: 0.5 % — ABNORMAL HIGH (ref 0.0–0.2)

## 2022-11-01 LAB — GLUCOSE, CAPILLARY
Glucose-Capillary: 132 mg/dL — ABNORMAL HIGH (ref 70–99)
Glucose-Capillary: 138 mg/dL — ABNORMAL HIGH (ref 70–99)
Glucose-Capillary: 184 mg/dL — ABNORMAL HIGH (ref 70–99)
Glucose-Capillary: 115 mg/dL — ABNORMAL HIGH (ref 70–99)

## 2022-11-01 MED ORDER — FERROUS SULFATE 325 (65 FE) MG PO TABS
325.0000 mg | ORAL_TABLET | ORAL | Status: DC
  Administered 2022-11-01 – 2022-11-03 (×2): 325 mg via ORAL
  Filled 2022-11-01 (×2): qty 1

## 2022-11-01 NOTE — Progress Notes (Signed)
Progress Note    Amber Wolfe  WUJ:[id number] DOB: September 16, 1931  DOA: 10/28/2022 PCP: Armando Gang, FNP      Brief Narrative:    Medical records reviewed and are as summarized below:  Amber Wolfe is a 89 y.o. 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  She said she tripped while walking from her kitchen.  She did not lose consciousness.  She was found to have left hip fracture.     Assessment/Plan:   Principal Problem:   Displaced intertrochanteric fracture of right femur, initial encounter for closed fracture [facility]) Active Problems:   Diabetes mellitus with neuropathy (HCC)   Essential hypertension, benign   Atrial fibrillation (HCC)   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, right (HCC)   Chills (without fever)   Falls   Hypokalemia   Acute blood loss as cause of postoperative anemia   Body mass index is 24.21 kg/m.   Right intertrochanteric femur fracture, s/p mechanical fall at home: S/p right hip intramedullary nail on 10/29/2022.  Continue analgesics as needed for pain.  PT recommended discharge to SNF   Acute blood loss anemia: Hemoglobin improved from 7.1-9.1.  S/p transfusion with 1 unit of PRBCs on 10/31/2022.  Start oral ferrous sulfate.  Hemoglobin was 12.8 on admission.    Thrombocytopenia: Improving   Hypokalemia: Improved   Chills: Improved.  No fever.  Urinalysis was unremarkable.  No acute abnormality on CT chest.  No indication for course of antibiotics at this time.   Paroxysmal atrial fibrillation: Continue metoprolol and Eliquis   Type II DM with hyperglycemia: Continue insulin glargine 12 units nightly.  Patient was taking glargine 12 units nightly at home.  Continue  NovoLog as needed for hyperglycemia.   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 Yes; Fluid consistency: Thin  Diet effective now                            Consultants: Orthopedic surgeon  Procedures: S/p right hip intramedullary nail on 10/29/2022    Medications:    apixaban  5 mg Oral BID   atorvastatin  20 mg Oral QHS   docusate sodium  100 mg Oral BID   feeding supplement  237 mL Oral BID BM   ferrous sulfate  325 mg Oral QODAY   insulin aspart  0-9 Units Subcutaneous TID WC   insulin glargine-yfgn  12 Units Subcutaneous QHS   letrozole  2.5 mg Oral QPM   metoprolol tartrate  12.5 mg Oral BID   multivitamin with minerals  1 tablet Oral Daily   Continuous Infusions:  methocarbamol (ROBAXIN) IV       Anti-infectives (From admission, onward)    Start     Dose/Rate Route Frequency Ordered Stop   10/29/22 2300  ceFAZolin (ANCEF) IVPB 2g/100 mL premix        2 g 200 mL/hr over 30 Minutes Intravenous Every 6 hours 10/29/22 1838 10/30/22 0604  Family Communication/Anticipated D/C date and plan/Code Status   DVT prophylaxis: SCDs Start: 10/29/22 1839 apixaban (ELIQUIS) tablet 5 mg     Code Status: Full Code  Family Communication: None Disposition Plan: Plan to discharge to SNF   Status is: Inpatient Remains inpatient appropriate because: Right hip fracture       Subjective:   No complaints.  She feels better.  Dizziness and headache have improved.  No pain in the right hip.  Objective:    Vitals:   10/31/22 1440 10/31/22 2303 11/01/22 0117 11/01/22 0813  BP: (!) 124/55 126/60 (!) 111/53 128/61  Pulse: 87 100 84 94  Resp: 18 16 18 16   Temp: 98.8 F (37.1 C) 98 F (36.7 C) 98.6 F (37 C) 98.9 F (37.2 C)   TempSrc: Oral     SpO2: 99% 100% 100% 100%  Weight:      Height:       No data found.   Intake/Output Summary (Last 24 hours) at 11/01/2022 1318 Last data filed at 11/01/2022 1025 Gross per 24 hour  Intake 687 ml  Output --  Net 687 ml   Filed Weights   10/28/22 1638 10/29/22 1514  Weight: 66 kg 66 kg    Exam:   GEN: NAD SKIN: Warm and dry EYES: No pallor or icterus ENT: MMM CV: RRR PULM: CTA B ABD: soft, ND, NT, +BS CNS: AAO x 3, non focal EXT: Dressing on right hip surgical wound is clean, dry and intact   Data Reviewed:   I have personally reviewed following labs and imaging studies:  Labs: Labs show the following:   Basic Metabolic Panel: Recent Labs  Lab 10/28/22 1641 10/30/22 0552 10/31/22 0359  NA 136 136 135  K 3.4* 4.0 4.0  CL 106 106 107  CO2 22 23 23   GLUCOSE 243* 262* 204*  BUN 20 21 29*  CREATININE 0.97 0.79 0.80  CALCIUM 8.8* 8.1* 8.3*  MG  --  2.4  --    GFR Estimated Creatinine Clearance: 42.9 mL/min (by C-G formula based on SCr of 0.8 mg/dL). Liver Function Tests: Recent Labs  Lab 10/28/22 1641  AST 21  ALT 12  ALKPHOS 46  BILITOT 0.9  PROT 6.4*  ALBUMIN 3.8   No results for input(s): "LIPASE", "AMYLASE" in the last 168 hours. No results for input(s): "AMMONIA" in the last 168 hours. Coagulation profile Recent Labs  Lab 10/28/22 1641  INR 1.4*    CBC: Recent Labs  Lab 10/28/22 1641 10/29/22 0451 10/30/22 0552 10/31/22 0359 11/01/22 0546  WBC 11.9* 9.9 9.6 9.9 6.5  NEUTROABS  --   --  8.3*  --   --   HGB 12.8 11.3* 8.1* 7.1* 9.1*  HCT 37.4 34.4* 23.0* 20.8* 26.5*  MCV 94.9 98.0 93.9 95.9 92.0  PLT 178 171 126* 143* 143*   Cardiac Enzymes: No results for input(s): "CKTOTAL", "CKMB", "CKMBINDEX", "TROPONINI" in the last 168 hours. BNP (last 3 results) No results for input(s): "PROBNP" in the last 8760 hours. CBG: Recent Labs  Lab 10/31/22 1140 10/31/22 1633 10/31/22 2249 11/01/22 0814 11/01/22 1121   GLUCAP 212* 163* 175* 132* 138*   D-Dimer: No results for input(s): "DDIMER" in the last 72 hours. Hgb A1c: No results for input(s): "HGBA1C" in the last 72 hours.  Lipid Profile: No results for input(s): "CHOL", "HDL", "LDLCALC", "TRIG", "CHOLHDL", "LDLDIRECT" in the last 72 hours. Thyroid function studies: No results for input(s): "TSH", "T4TOTAL", "T3FREE", "THYROIDAB" in the last 72  hours.  Invalid input(s): "FREET3" Anemia work up: No results for input(s): "VITAMINB12", "FOLATE", "FERRITIN", "TIBC", "IRON", "RETICCTPCT" in the last 72 hours. Sepsis Labs: Recent Labs  Lab 10/28/22 2127 10/29/22 0152 10/29/22 0451 10/30/22 0552 10/31/22 0359 11/01/22 0546  WBC  --   --  9.9 9.6 9.9 6.5  LATICACIDVEN 2.6* 1.1  --   --   --   --     Microbiology Recent Results (from the past 240 hour(s))  Culture, blood (Routine X 2) w Reflex to ID Panel     Status: None (Preliminary result)   Collection Time: 10/28/22  9:17 PM   Specimen: BLOOD  Result Value Ref Range Status   Specimen Description BLOOD RIGHT ANTECUBITAL  Final   Special Requests   Final    BOTTLES DRAWN AEROBIC AND ANAEROBIC Blood Culture adequate volume   Culture   Final    NO GROWTH 4 DAYS Performed at [facility], [address] [place], [place] [address]    Report Status PENDING  Incomplete  Culture, blood (Routine X 2) w Reflex to ID Panel     Status: None (Preliminary result)   Collection Time: 10/28/22  9:27 PM   Specimen: BLOOD  Result Value Ref Range Status   Specimen Description BLOOD LEFT ANTECUBITAL  Final   Special Requests   Final    BOTTLES DRAWN AEROBIC AND ANAEROBIC Blood Culture results may not be optimal due to an excessive volume of blood received in culture bottles   Culture   Final    NO GROWTH 4 DAYS Performed at [facility], [address] [place], [place] [address]    Report Status PENDING  Incomplete  Urine Culture (for pregnant, neutropenic or urologic  patients or patients with an indwelling urinary catheter)     Status: None   Collection Time: 10/29/22  2:49 AM   Specimen: Urine, Clean Catch  Result Value Ref Range Status   Specimen Description   Final    URINE, CLEAN CATCH Performed at [facility], [address] [place], [place] [address]    Special Requests   Final    NONE Performed at [facility], [address], [place], [place] [address]    Culture   Final    NO GROWTH Performed at [facility], [address], [place], [place] [address]    Report Status 10/30/2022 FINAL  Final  Surgical PCR screen     Status: None   Collection Time: 10/29/22  6:34 AM   Specimen: Nasal Mucosa; Nasal Swab  Result Value Ref Range Status   MRSA, PCR NEGATIVE NEGATIVE Final   Staphylococcus aureus NEGATIVE NEGATIVE Final    Comment: (NOTE) The Xpert SA Assay (FDA approved for NASAL specimens in patients 60 years of age and older), is one component of a comprehensive surveillance program. It is not intended to diagnose infection nor to guide or monitor treatment. Performed at [facility], [address] [place], [place] [address]     Procedures and diagnostic studies:  No results found.             LOS: 4 days      [facility]   Pager on [email or web address]. If 7PM-7AM, please contact night-coverage at [email or web address]     11/01/2022, 1:18 PM

## 2022-11-01 NOTE — TOC Initial Note (Signed)
Transition of Care [facility]) - Initial/Assessment Note    Patient Details  Name: Amber Wolfe MRN: [id number] Date of Birth: Aug 14, 1931  Transition of [facility]) CM/SW Contact:    Garret Reddish, RN Phone Number: 11/01/2022, 11:35 AM  Clinical Narrative:    Chart reviewed.  Noted that patient was admitted with  Displaced intertrochanteric fracture of right femur, initial encounter for closed fracture.  Patient is s/p  INTRAMEDULLARY (IM) NAIL INTERTROCHANTERIC (Right).    I have meet with patient at bedside today.  Patient informs me that prior to admission she lived at home by herself.  She reports that she has two daughters that check on her. She reports that she was able to bath and dress herself prior to admission.  She reports that her daughters would take her to appointments.    I have discussed with her PT recommendations for SNF.  Patient reports that she has been to rehab before and was agreeable to going to rehab again.  She is unable to recall the name of the facility and asked that I speak with her daughter Amber Wolfe.  I have explained the SNF process.  She verbalizes understanding.    I have spoken with patient's daughter Amber Wolfe.  Amber Wolfe informs me that she handles all of Amber Wolfe's finances and assist with her needs at home.  Amber Wolfe reports that she would prefer that patient goes to [facility]. She does not want Amber Wolfe to go to [facility].  I have also explained the SNF process to Amber Wolfe. She was agreeable to a bed search in the [place] area facilities except for [facility]. Amber Wolfe informs me that she is leaving to go out of town on Tuesday and will not be back till Saturday.  I informed her that TOC will update her on bed offers once received.    I have completed Fl 2 and faxed out to [facility] area facilities expect for [facility].    TOC will continue to follow for discharge planning.         Expected Discharge Plan: Skilled Nursing  Facility Barriers to Discharge: No Barriers Identified   Patient Goals and CMS Choice   CMS Medicare.gov Compare Post Acute Care list provided to:: Patient Choice offered to / list presented to : Patient, Adult Children (Patient POA Amber Wolfe)      Expected Discharge Plan and Services   Discharge Planning Services: CM Consult Post Acute Care Choice: Skilled Nursing Facility Living arrangements for the past 2 months: Single Family Home                                      Prior Living Arrangements/Services Living arrangements for the past 2 months: Single Family Home Lives with:: Self Patient language and need for interpreter reviewed:: Yes Do you feel safe going back to the place where you live?: Yes        Care giver support system in place?: Yes (comment) (Patient has 2 supportive daughters)      Activities of Daily Living Home Assistive Devices/Equipment: Dan Humphreys (specify type) ADL Screening (condition at time of admission) Patient's cognitive ability adequate to safely complete daily activities?: Yes Is the patient deaf or have difficulty hearing?: Yes Does the patient have difficulty seeing, even when wearing glasses/contacts?: No Does the patient have difficulty concentrating, remembering, or making decisions?: Yes Patient able to express need  for assistance with ADLs?: Yes Does the patient have difficulty dressing or bathing?: Yes Independently performs ADLs?: No Communication: Independent Dressing (OT): Needs assistance Is this a change from baseline?: Change from baseline, expected to last <3days Grooming: Needs assistance Is this a change from baseline?: Change from baseline, expected to last <3 days Feeding: Independent Bathing: Needs assistance Is this a change from baseline?: Change from baseline, expected to last <3 days Toileting: Needs assistance Is this a change from baseline?: Change from baseline, expected to last <3 days In/Out Bed: Needs  assistance (bed rest at the moment) Is this a change from baseline?: Change from baseline, expected to last <3 days Walks in Home: Needs assistance Is this a change from baseline?: Change from baseline, expected to last <3 days Does the patient have difficulty walking or climbing stairs?: Yes Weakness of Legs: Right Weakness of Arms/Hands: None  Permission Sought/Granted                  Emotional Assessment   Attitude/Demeanor/Rapport: Engaged Affect (typically observed): Pleasant Orientation: : Oriented to Self, Oriented to Place, Oriented to  Time, Oriented to Situation      Admission diagnosis:  Closed right hip fracture (HCC) [S72.001A] Patient Active Problem List   Diagnosis Date Noted   Acute blood loss as cause of postoperative anemia 10/31/2022   Chills (without fever) 10/29/2022   Falls 10/29/2022   Hypokalemia 10/29/2022   Displaced intertrochanteric fracture of right femur, initial encounter for closed fracture (HCC) 10/28/2022   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, right (HCC) 01/23/2021   Osteoporosis 12/17/2016   Bradycardia 12/17/2016   Microalbuminuria 06/17/2016   Typical atrial flutter (HCC)    SOB (shortness of breath)    Palpitations 02/17/2016   Mitral regurgitation 02/17/2016   Left calcaneal bursitis 09/19/2015   Atypical atrial flutter (HCC) 06/18/2015   Hyperlipidemia 06/18/2015   Primary osteoarthritis of right knee 05/20/2015   Breast cancer screening 05/20/2015   Diabetes mellitus with neuropathy (HCC) 05/15/2015   Essential hypertension, benign 05/15/2015   Medication monitoring encounter 05/15/2015   Osteoarthritis of both hands 05/15/2015   Decreased hearing of both ears 05/15/2015   Atrial fibrillation (HCC) 05/15/2015   Fracture of distal radius and ulna, left, closed, initial encounter 04/25/2014   PCP:  Amber Gang, FNP Pharmacy:   [facility] - [place], [place] - [address] [place] [address] Phone: [phone] Fax: [phone]     Social Determinants of Health (SDOH) Social History: SDOH Screenings   Food Insecurity: No Food Insecurity (10/29/2022)  Housing: Low Risk  (10/28/2022)  Transportation Needs: No Transportation Needs (10/28/2022)  Utilities: Not At Risk (10/29/2022)  Financial Resource Strain: Low Risk  (11/02/2017)  Physical Activity: Inactive (11/02/2017)  Social Connections: Unknown (11/02/2017)  Stress: Stress Concern Present (11/02/2017)  Tobacco Use: Low Risk  (10/29/2022)   SDOH Interventions:     Readmission Risk Interventions     No data to display

## 2022-11-01 NOTE — Plan of Care (Signed)
  Problem: Education: Goal: Knowledge of General Education information will improve Description: Including pain rating scale, medication(s)/side effects and non-pharmacologic comfort measures Outcome: Progressing   Problem: Activity: Goal: Risk for activity intolerance will decrease Outcome: Progressing   Problem: Nutrition: Goal: Adequate nutrition will be maintained Outcome: Progressing   Problem: Elimination: Goal: Will not experience complications related to urinary retention Outcome: Progressing   Problem: Pain Managment: Goal: General experience of comfort will improve Outcome: Progressing   Problem: Safety: Goal: Ability to remain free from injury will improve Outcome: Progressing   

## 2022-11-01 NOTE — Progress Notes (Signed)
Physical Therapy Treatment Patient Details Name: Amber Wolfe MRN: [id number] DOB: [birth year] Today's Date: 11/01/2022   History of Present Illness Amber Wolfe is an 89yoF who comes to [facility] 10/28/22 after fall at home, tripped, hit head, imaging revealing of right intertrochanteric fracture, underwent Rt hip IM fixation c Dr. Audelia Acton. Pt is WBAT postoperatively. Hb down to 8.1 POD1 v 11.3 preop.    PT Comments  Pt ready for session.  Stated she walked to bathroom with nursing yesterday but it was challenging for her.  She is able to get to EOB on right in attempt to walk to bathroom again today.  Dizziness reported upon sitting but clears with time.  She stands and after 2 steps with min a x 1 stated her head again feels "funny".  She is cues to step back to bed.  BSC is obtained as she does need to void and cannot wait for dynamap.  She is able to transfer to [facility], voids a small amount then steps to recliner that is brought to right side of bed.  Once in chair she does state she feels better. Seated AROM x 10.  Remains in chair with needs met.  Pt continued with dizziness with mobility that she stated is not her baseline.  She was given blood yesterday but dizziness remains.  May be wise to check orthostatics tomorrow if it does not resolve.   If plan is discharge home, recommend the following: A little help with walking and/or transfers;A little help with bathing/dressing/bathroom;Assistance with cooking/housework;Assist for transportation;Help with stairs or ramp for entrance   Can travel by private vehicle        Equipment Recommendations  None recommended by PT    Recommendations for Other Services       Precautions / Restrictions Precautions Precautions: Fall Restrictions Weight Bearing Restrictions: Yes RLE Weight Bearing: Weight bearing as tolerated     Mobility  Bed Mobility Overal bed mobility: Needs Assistance Bed Mobility: Supine to Sit     Supine to sit: Mod assist      General bed mobility comments: R side of bed. Patient Response: Cooperative  Transfers Overall transfer level: Needs assistance Equipment used: Rolling walker (2 wheels) Transfers: Sit to/from Stand, Bed to chair/wheelchair/BSC Sit to Stand: Min assist   Step pivot transfers: Min assist            Ambulation/Gait Ambulation/Gait assistance: Min assist Gait Distance (Feet): 2 Feet Assistive device: Rolling walker (2 wheels) Gait Pattern/deviations: Step-to pattern Gait velocity: dec     General Gait Details: slow limited by dizziness.  does seem to resolve with time - after [facility] and transfer to recliner   Stairs             Wheelchair Mobility     Tilt Bed Tilt Bed Patient Response: Cooperative  Modified Rankin (Stroke Patients Only)       Balance Overall balance assessment: Needs assistance Sitting-balance support: Feet supported Sitting balance-Leahy Scale: Good     Standing balance support: Bilateral upper extremity supported Standing balance-Leahy Scale: Fair Standing balance comment: reports dizziness but able to stand at commode for hygiene, was steady.                            Cognition Arousal/Alertness: Awake/alert Behavior During Therapy: WFL for tasks assessed/performed Overall Cognitive Status: History of cognitive impairments - at baseline  Exercises      General Comments        Pertinent Vitals/Pain Pain Assessment Pain Assessment: Faces Faces Pain Scale: Hurts little more Pain Location: operative hip - premedicated and stated it is better than yesterday Pain Descriptors / Indicators: Discomfort, Guarding Pain Intervention(s): Limited activity within patient's tolerance, Monitored during session, Premedicated before session, Repositioned    Home Living                          Prior Function            PT Goals (current goals can now  be found in the care plan section) Progress towards PT goals: Progressing toward goals    Frequency    BID      PT Plan Current plan remains appropriate    Co-evaluation              AM-PAC PT "6 Clicks" Mobility   Outcome Measure  Help needed turning from your back to your side while in a flat bed without using bedrails?: A Lot Help needed moving from lying on your back to sitting on the side of a flat bed without using bedrails?: A Lot Help needed moving to and from a bed to a chair (including a wheelchair)?: A Little Help needed standing up from a chair using your arms (e.g., wheelchair or bedside chair)?: A Little Help needed to walk in hospital room?: A Lot Help needed climbing 3-5 steps with a railing? : Total 6 Click Score: 13    End of Session Equipment Utilized During Treatment: Gait belt Activity Tolerance: Patient tolerated treatment well;Treatment limited secondary to medical complications (Comment) Patient left: in chair;with call bell/phone within reach;with chair alarm set Nurse Communication: Mobility status PT Visit Diagnosis: Difficulty in walking, not elsewhere classified (R26.2);Other abnormalities of gait and mobility (R26.89)     Time: 5366-4403 PT Time Calculation (min) (ACUTE ONLY): 15 min  Charges:    $Therapeutic Activity: 8-22 mins PT General Charges $$ ACUTE PT VISIT: 1 Visit                   Tanishia Lemaster, PTA 11/01/22, 10:45 AM

## 2022-11-01 NOTE — NC FL2 (Signed)
[place] MEDICAID FL2 LEVEL OF CARE FORM     IDENTIFICATION  Patient Name: Amber Wolfe Birthdate: 01/08/1932 Sex: female Admission Date (Current Location): 10/28/2022  [facility] and [place] Number:  Chiropodist and Address:  [facility], [address], [place], [place] [address]      Provider Number: [id number]  Attending Physician Name and Address:  Lurene Shadow, MD  Relative Name and Phone Number:  Rosalene Billings  [phone]    Current Level of Care: Hospital Recommended Level of Care: Skilled Nursing Facility Prior Approval Number:    Date Approved/Denied:   PASRR Number: [id number]  Discharge Plan: SNF    Current Diagnoses: Patient Active Problem List   Diagnosis Date Noted   Acute blood loss as cause of postoperative anemia 10/31/2022   Chills (without fever) 10/29/2022   Falls 10/29/2022   Hypokalemia 10/29/2022   Displaced intertrochanteric fracture of right femur, initial encounter for closed fracture (HCC) 10/28/2022   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, right (HCC) 01/23/2021   Osteoporosis 12/17/2016   Bradycardia 12/17/2016   Microalbuminuria 06/17/2016   Typical atrial flutter (HCC)    SOB (shortness of breath)    Palpitations 02/17/2016   Mitral regurgitation 02/17/2016   Left calcaneal bursitis 09/19/2015   Atypical atrial flutter (HCC) 06/18/2015   Hyperlipidemia 06/18/2015   Primary osteoarthritis of right knee 05/20/2015   Breast cancer screening 05/20/2015   Diabetes mellitus with neuropathy (HCC) 05/15/2015   Essential hypertension, benign 05/15/2015   Medication monitoring encounter 05/15/2015   Osteoarthritis of both hands 05/15/2015   Decreased hearing of both ears 05/15/2015   Atrial fibrillation (HCC) 05/15/2015   Fracture of distal radius and ulna, left, closed, initial encounter 04/25/2014    Orientation RESPIRATION BLADDER Height & Weight     Self, Time, Situation, Place   Normal Continent Weight: 66 kg Height:  5\' 5"  (165.1 cm)  BEHAVIORAL SYMPTOMS/MOOD NEUROLOGICAL BOWEL NUTRITION STATUS      Continent  (See Discharge Summary)  AMBULATORY STATUS COMMUNICATION OF NEEDS Skin   Extensive Assist Verbally Normal                       Personal Care Assistance Level of Assistance  Bathing, Feeding, Dressing Bathing Assistance: Limited assistance Feeding assistance: Limited assistance Dressing Assistance: Maximum assistance     Functional Limitations Info  Sight, Hearing, Speech Sight Info: Adequate Hearing Info: Adequate Speech Info: Adequate    SPECIAL CARE FACTORS FREQUENCY  PT (By licensed PT), OT (By licensed OT)     PT Frequency: 5x weekly OT Frequency: 5x weekly            Contractures Contractures Info: Not present    Additional Factors Info  Code Status, Allergies Code Status Info: Full Code Allergies Info: No Known Allergies           Current Medications (11/01/2022):  This is the current hospital active medication list Current Facility-Administered Medications  Medication Dose Route Frequency Provider Last Rate Last Admin   acetaminophen (TYLENOL) tablet 325-650 mg  325-650 mg Oral Q6H PRN Reinaldo Berber, MD       apixaban Everlene Balls) tablet 5 mg  5 mg Oral BID Amador Cunas C, PA-C   5 mg at 11/01/22 0929   atorvastatin (LIPITOR) tablet 20 mg  20 mg Oral QHS Gertha Calkin, MD   20 mg at 10/31/22 2308   bisacodyl (DULCOLAX) EC tablet 5 mg  5 mg Oral  Daily PRN Gertha Calkin, MD       docusate sodium (COLACE) capsule 100 mg  100 mg Oral BID Reinaldo Berber, MD   100 mg at 11/01/22 0930   feeding supplement (ENSURE ENLIVE / ENSURE PLUS) liquid 237 mL  237 mL Oral BID BM Lurene Shadow, MD   237 mL at 10/31/22 1447   ferrous sulfate tablet 325 mg  325 mg Oral Philipp Deputy, MD   325 mg at 11/01/22 0929   HYDROcodone-acetaminophen (NORCO) 7.5-325 MG per tablet 1-2 tablet  1-2 tablet Oral Q4H PRN Reinaldo Berber, MD   1  tablet at 11/01/22 0929   HYDROcodone-acetaminophen (NORCO/VICODIN) 5-325 MG per tablet 1-2 tablet  1-2 tablet Oral Q4H PRN Reinaldo Berber, MD   2 tablet at 10/31/22 0735   insulin aspart (novoLOG) injection 0-9 Units  0-9 Units Subcutaneous TID WC Lurene Shadow, MD   1 Units at 11/01/22 0838   insulin glargine-yfgn (SEMGLEE) injection 12 Units  12 Units Subcutaneous QHS Lurene Shadow, MD   12 Units at 10/31/22 2309   letrozole [facility]) tablet 2.5 mg  2.5 mg Oral QPM Gertha Calkin, MD   2.5 mg at 10/31/22 1704   menthol-cetylpyridinium (CEPACOL) lozenge 3 mg  1 lozenge Oral PRN Reinaldo Berber, MD       Or   phenol (CHLORASEPTIC) mouth spray 1 spray  1 spray Mouth/Throat PRN Reinaldo Berber, MD       methocarbamol (ROBAXIN) tablet 500 mg  500 mg Oral Q6H PRN Gertha Calkin, MD       Or   methocarbamol (ROBAXIN) 500 mg in dextrose 5 % 50 mL IVPB  500 mg Intravenous Q6H PRN Gertha Calkin, MD       metoCLOPramide (REGLAN) tablet 5-10 mg  5-10 mg Oral Q8H PRN Reinaldo Berber, MD       Or   metoCLOPramide (REGLAN) injection 5-10 mg  5-10 mg Intravenous Q8H PRN Reinaldo Berber, MD   10 mg at 11/01/22 1126   metoprolol tartrate (LOPRESSOR) tablet 12.5 mg  12.5 mg Oral BID Irena Cords V, MD   12.5 mg at 11/01/22 6213   morphine (PF) 2 MG/ML injection 0.5-1 mg  0.5-1 mg Intravenous Q2H PRN Reinaldo Berber, MD       multivitamin with minerals tablet 1 tablet  1 tablet Oral Daily Lurene Shadow, MD   1 tablet at 11/01/22 0929   ondansetron (ZOFRAN) tablet 4 mg  4 mg Oral Q6H PRN Reinaldo Berber, MD       Or   ondansetron [facility]) injection 4 mg  4 mg Intravenous Q6H PRN Reinaldo Berber, MD   4 mg at 11/01/22 0926   polyethylene glycol (MIRALAX / GLYCOLAX) packet 17 g  17 g Oral Daily PRN Gertha Calkin, MD         Discharge Medications: Please see discharge summary for a list of discharge medications.  Relevant Imaging Results:  Relevant Lab Results:   Additional  Information [id number]  Garret Reddish, RN

## 2022-11-01 NOTE — Progress Notes (Signed)
Subjective: 3 Days Post-Op Procedure(s) (LRB): INTRAMEDULLARY (IM) NAIL INTERTROCHANTERIC (Right) Patient reports pain as mild but does report increased pain with PT yesterday.  Patient is well, Hg 9.1, s/p blood transfusion yesterday. Plan is likely for discharge to SNF. Denies any CP, SOB, ABD pain. She has had a BM.  Objective: Vital signs in last 24 hours: Temp:  [98 F (36.7 C)-98.8 F (37.1 C)] 98.6 F (37 C) (08/04 0117) Pulse Rate:  [80-100] 84 (08/04 0117) Resp:  [16-18] 18 (08/04 0117) BP: (101-126)/(47-60) 111/53 (08/04 0117) SpO2:  [99 %-100 %] 100 % (08/04 0117)  Intake/Output from previous day: 08/03 0701 - 08/04 0700 In: 747 [P.O.:360; I.V.:37; Blood:350] Out: -  Intake/Output this shift: No intake/output data recorded.  Recent Labs    10/30/22 0552 10/31/22 0359 11/01/22 0546  HGB 8.1* 7.1* 9.1*   Recent Labs    10/31/22 0359 11/01/22 0546  WBC 9.9 6.5  RBC 2.17* 2.88*  HCT 20.8* 26.5*  PLT 143* 143*   Recent Labs    10/30/22 0552 10/31/22 0359  NA 136 135  K 4.0 4.0  CL 106 107  CO2 23 23  BUN 21 29*  CREATININE 0.79 0.80  GLUCOSE 262* 204*  CALCIUM 8.1* 8.3*   No results for input(s): "LABPT", "INR" in the last 72 hours.   EXAM General - Patient is Alert, Appropriate, and Oriented Extremity - Neurovascular intact Sensation intact distally Intact pulses distally Dorsiflexion/Plantar flexion intact No cellulitis present Compartment soft Dressing - dressing C/D/I and no drainage Motor Function - intact, moving foot and toes well on exam.   Past Medical History:  Diagnosis Date   Arthritis    In hands   Atrial flutter (HCC)    a. s/p successful TEE/DCCV 07/31/2015; b. 01/2016 Amio added for Afib; c. 11/2017 Amio d/c'd 2/2 bradycardia; d. 12/2018 Back in aflutter-->Zio: Avg HR 74 (41-176) 100% Afl->bb added back; e. CHADS2VASc => 6 (CHF, HTN, age x 2, DM, female)-->eliquis 2.5 bid.   Bradycardia    a. 11/2017 noted during  hospitalization @ [facility]-->amio d/c'd by cardiology.   Cataract    Chronic combined systolic (congestive) and diastolic (congestive) heart failure (HCC)    a. 06/2015 Echo: EF 40%, basal HK, nl WM of mid and apical segments, mild to mod MR/TR; b. TEE 07/31/2015: nl LV systolic function, mild to mod MR, trivial TR; c. 06/2017 Echo: EF 55-60%, mild LVH, Gr2 DD, triv AI, mild MR, mod dil LA, mildly dil RA, mild to mod TR. PASP 35-77mmHg; d. 01/2019 Echo: Ef 55-60%, mod dil LA mildly dil RA. Mild TR. Trace MR.   Diabetes mellitus without complication (HCC)    Type II   GERD (gastroesophageal reflux disease)    Glaucoma    Right Eye   Hematoma of abdominal wall    a. 11/2017 Fall-->R flank hematoma w/ anemia req PRBC. Xarelto dc'd at that time.   History of SCC (squamous cell carcinoma) of skin 08/20/2020   left forearm EDC   Hyperlipidemia    Hypertension    Moderate mitral regurgitation    a. see echo from 06/2015 and TEE 07/2015; b. 06/2017 Echo: Mild MR.   PAF (paroxysmal atrial fibrillation) (HCC)    a.  01/2016 Ad[facility] to [facility] with PAF -->amio added (later dc'd 11/2017 2/2 bradycardia).   Right renal mass    a. 11/2017 CT [facility]): 3.4cm R upper pole renal mass concerning for renal cell carcinoma-->outpt f/u w/ urology.   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   Weight as of this encounter: 66 kg. Advance  diet Up with therapy Pain well controlled this morning. Acute post op blood loss anemia - Hgb 9.1, s/p blood transfusion. CM to assist with discharge to home with HHPT vs. SNF pending continued progress with PT.  Most likely will need SNF following discharge. She has had a BM following surgery.  Follow up with [facility] in 2 weeks Lovenox 40 mg SQ daily x 14 days  DVT Prophylaxis - Lovenox, TED hose, and SCDS Weight-Bearing as tolerated to right leg  J. Horris Latino, PA-C [facility] 11/01/2022, 7:39 AM

## 2022-11-02 DIAGNOSIS — S72141A Displaced intertrochanteric fracture of right femur, initial encounter for closed fracture: Secondary | ICD-10-CM | POA: Diagnosis not present

## 2022-11-02 LAB — GLUCOSE, CAPILLARY
Glucose-Capillary: 164 mg/dL — ABNORMAL HIGH (ref 70–99)
Glucose-Capillary: 235 mg/dL — ABNORMAL HIGH (ref 70–99)
Glucose-Capillary: 187 mg/dL — ABNORMAL HIGH (ref 70–99)
Glucose-Capillary: 176 mg/dL — ABNORMAL HIGH (ref 70–99)

## 2022-11-02 LAB — CBC
HCT: 26.5 % — ABNORMAL LOW (ref 36.0–46.0)
Hemoglobin: 9 g/dL — ABNORMAL LOW (ref 12.0–15.0)
MCH: 31.5 pg (ref 26.0–34.0)
MCHC: 34 g/dL (ref 30.0–36.0)
MCV: 92.7 fL (ref 80.0–100.0)
Platelets: 175 10*3/uL (ref 150–400)
RBC: 2.86 MIL/uL — ABNORMAL LOW (ref 3.87–5.11)
RDW: 16.3 % — ABNORMAL HIGH (ref 11.5–15.5)
WBC: 5.9 10*3/uL (ref 4.0–10.5)
nRBC: 0 % (ref 0.0–0.2)

## 2022-11-02 MED ORDER — ACETAMINOPHEN 325 MG PO TABS: 325.0000 mg | ORAL_TABLET | Freq: Four times a day (QID) | ORAL | Status: DC | PRN

## 2022-11-02 MED ORDER — HYDROCODONE-ACETAMINOPHEN 5-325 MG PO TABS: 1.0000 | ORAL_TABLET | Freq: Three times a day (TID) | ORAL | 0 refills | Status: DC | PRN

## 2022-11-02 NOTE — ED Provider Notes (Signed)
[facility] Provider Note    Event Date/Time   First MD Initiated Contact with Patient 10/28/22 1636     (approximate)   History   Fall   HPI  RIYANN RUSTIN is a 89 y.o. female with a notable history of atrial fibrillation on anticoagulation as well as diabetes    The patient was at her home, stumbled and fell onto her right side.  She reports not hitting her head or suffering any other significant injury or having neck pain, but reports significant pain around her right hip area.  Denies numbness or tingling.  No neck pain or headache.  No chest pain or trouble breathing no preceding illness.  She has otherwise been in her normal state of health   Physical Exam   Triage Vital Signs: ED Triage Vitals  Encounter Vitals Group     BP 10/28/22 1638 134/67     Systolic BP Percentile --      Diastolic BP Percentile --      Pulse Rate 10/28/22 1638 (!) 50     Resp 10/28/22 1638 (!) 25     Temp 10/28/22 1638 98.6 F (37 C)     Temp Source 10/28/22 1638 Oral     SpO2 10/28/22 1645 100 %     Weight 10/28/22 1638 145 lb 8.1 oz (66 kg)     Height 10/28/22 1638 5\' 5"  (1.651 m)     Head Circumference --      Peak Flow --      Pain Score 10/28/22 1637 8     Pain Loc --      Pain Education --      Exclude from Growth Chart --     Most recent vital signs: Vitals:   11/02/22 0105 11/02/22 0805  BP: 133/66 (!) 141/62  Pulse: 97 97  Resp: 16 16  Temp: 99.2 F (37.3 C) 98.9 F (37.2 C)  SpO2: 100% 100%     General: Awake, no distress.  She does appear in any moderate pain when palpating along the right hip and trochanteric region which is also slightly rotated and shortened.  She otherwise denies pain and reports as long as she "does not move" she feels fine CV:  Good peripheral perfusion.  Strong right lower extremity pulses including dorsalis pedis and posterior tibial warm well-perfused able to wiggle toes.  No obvious injuries to the remainder of  the right lower extremity but quite tender over the right trochanteric region without bruising or open injury Resp:  Normal effort.  Clear lungs bilaterally Abd:  No distention.  Other:  No cervical tenderness   ED Results / Procedures / Treatments   Labs (all labs ordered are listed, but only abnormal results are displayed)  Initial lab testing is notable for mild leukocytosis, patient denies any infectious symptoms does not have a fever, reports to be in normal health prior.  Suspect this is likely reactive.  Also mild hypokalemia INR 1.4 suggestive of active anticoagulation which she reports compliance with  EKG  Interpreted by me at 1640 Heart rate 80, atrial fibrillation, no evidence of frank ischemia.  Some baseline artifact   RADIOLOGY   Right hip x-ray interpreted by me as intertrochanteric right hip fracture.  Discussed case with Dr. Audelia Acton  CT Head Wo Contrast  Result Date: 10/28/2022 CLINICAL DATA:  Head trauma, intracranial venous injury suspected; Facial trauma, blunt EXAM: CT HEAD WITHOUT CONTRAST CT CERVICAL SPINE WITHOUT CONTRAST TECHNIQUE: Multidetector  CT imaging of the head and cervical spine was performed following the standard protocol without intravenous contrast. Multiplanar CT image reconstructions of the cervical spine were also generated. RADIATION DOSE REDUCTION: This exam was performed according to the departmental dose-optimization program which includes automated exposure control, adjustment of the mA and/or kV according to patient size and/or use of iterative reconstruction technique. COMPARISON:  CT Head and C Spine 12/06/17 FINDINGS: CT HEAD FINDINGS Brain: No evidence of acute infarction, hemorrhage, hydrocephalus, extra-axial collection or mass lesion/mass effect. Sequela of moderate chronic microvascular ischemic change. 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 Vascular: No hyperdense vessel or unexpected calcification. Skull: Normal. Negative for  fracture or focal lesion. Sinuses/Orbits: No middle ear or mastoid effusion. Paranasal sinuses are clear. Bilateral lens replacement. Orbits are otherwise unremarkable. Other: None. CT CERVICAL SPINE FINDINGS Alignment: Grade 1 anterolisthesis of C3 on C4 and C7 on T1. Skull base and vertebrae: No acute fracture. No primary bone lesion or focal pathologic process. Soft tissues and spinal canal: No prevertebral fluid or swelling. No visible canal hematoma. Disc levels:  No evidence of high-grade spinal canal stenosis. Upper chest: Bilateral subpleural reticulations can be seen in the setting of chronic interstitial lung disease. Other: Calcified atherosclerotic plaque of the carotid bifurcations bilaterally. IMPRESSION: 1. No CT evidence of intracranial injury. 2. No acute fracture or traumatic malalignment of the cervical spine. Electronically Signed   By: Lorenza Cambridge M.D.   On: 10/28/2022 17:41   CT Cervical Spine Wo Contrast  Result Date: 10/28/2022 CLINICAL DATA:  Head trauma, intracranial venous injury suspected; Facial trauma, blunt EXAM: CT HEAD WITHOUT CONTRAST CT CERVICAL SPINE WITHOUT CONTRAST TECHNIQUE: Multidetector CT imaging of the head and cervical spine was performed following the standard protocol without intravenous contrast. Multiplanar CT image reconstructions of the cervical spine were also generated. RADIATION DOSE REDUCTION: This exam was performed according to the departmental dose-optimization program which includes automated exposure control, adjustment of the mA and/or kV according to patient size and/or use of iterative reconstruction technique. COMPARISON:  CT Head and C Spine 12/06/17 FINDINGS: CT HEAD FINDINGS Brain: No evidence of acute infarction, hemorrhage, hydrocephalus, extra-axial collection or mass lesion/mass effect. Sequela of moderate chronic microvascular ischemic change. 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 Vascular: No hyperdense vessel or unexpected  calcification. Skull: Normal. Negative for fracture or focal lesion. Sinuses/Orbits: No middle ear or mastoid effusion. Paranasal sinuses are clear. Bilateral lens replacement. Orbits are otherwise unremarkable. Other: None. CT CERVICAL SPINE FINDINGS Alignment: Grade 1 anterolisthesis of C3 on C4 and C7 on T1. Skull base and vertebrae: No acute fracture. No primary bone lesion or focal pathologic process. Soft tissues and spinal canal: No prevertebral fluid or swelling. No visible canal hematoma. Disc levels:  No evidence of high-grade spinal canal stenosis. Upper chest: Bilateral subpleural reticulations can be seen in the setting of chronic interstitial lung disease. Other: Calcified atherosclerotic plaque of the carotid bifurcations bilaterally. IMPRESSION: 1. No CT evidence of intracranial injury. 2. No acute fracture or traumatic malalignment of the cervical spine. Electronically Signed   By: Lorenza Cambridge M.D.   On: 10/28/2022 17:41   DG Chest 1 View  Result Date: 10/28/2022 CLINICAL DATA:  Right hip deformity.  Trauma EXAM: CHEST  1 VIEW COMPARISON:  X-ray 12/06/2017 FINDINGS: Calcified aorta. Film is rotated to the left. No consolidation, pneumothorax or effusion. No edema. Slight thickening along the minor fissure. Questionable nodule left midlung overlying the posterior aspect of the left seventh rib. Degenerative  changes of the spine. IMPRESSION: Small nodular density along the left midthorax. This could be summation of shadow. Recommend standard two-view x-ray to further delineate or CT when appropriate Electronically Signed   By: Karen Kays M.D.   On: 10/28/2022 17:35   DG Hip Unilat W or Wo Pelvis 2-3 Views Right  Result Date: 10/28/2022 CLINICAL DATA:  Pain after fall EXAM: DG HIP (WITH OR WITHOUT PELVIS) 3V RIGHT COMPARISON:  None Available. FINDINGS: Comminuted foreshortened and angulated intertrochanteric right hip fracture. No additional fracture or dislocation. Preserved joint spaces.  Osteopenia. Note is made of prominent colonic stool. IMPRESSION: Comminuted foreshortened and angulated intertrochanteric right hip fracture. Electronically Signed   By: Karen Kays M.D.   On: 10/28/2022 17:33      PROCEDURES:  Critical Care performed: No  Procedures   MEDICATIONS ORDERED IN ED:    IMPRESSION / MDM / ASSESSMENT AND PLAN / ED COURSE  I reviewed the triage vital signs and the nursing notes.                              Differential diagnosis includes, but is not limited to, fall with right hip fracture, likely mechanical in nature based on patient's symptomatology.  No other injuries demoted but given her history of anticoagulation which to exclude intracranial hemorrhage, and CT of the head and cervical spine are without acute abnormality.  Lip otherwise appropriate with regard and exception to mild leukocytosis of unclear etiology.  No obvious signs or symptoms of infection based on the presentation.  Discussed case as well as right hip fracture with our orthopedic physician, he advises he will provide consult.  But does not anticipate surgery to occur today, family updated at the bedside, consulted with hospitalist patient accepted the service of Dr. Allena Katz  Patient's presentation is most consistent with acute complicated illness / injury requiring diagnostic workup.          FINAL CLINICAL IMPRESSION(S) / ED DIAGNOSES   Final diagnoses:  Displaced intertrochanteric fracture of right femur, initial encounter for closed fracture (HCC)     Rx / DC Orders      Note:  This document was prepared using Dragon voice recognition software and may include unintentional dictation errors.   Sharyn Creamer, MD 11/02/22 1226

## 2022-11-02 NOTE — Progress Notes (Addendum)
Progress Note    [name]  OZH:[id number] DOB: 01/05/1932  DOA: 10/28/2022 PCP: Armando Gang, FNP      Brief Narrative:    Medical records reviewed and are as summarized below:  Amber Wolfe is a 89 y.o. 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  She said she tripped while walking from her kitchen.  She did not lose consciousness.  She was found to have left hip fracture.     Assessment/Plan:   Principal Problem:   Displaced intertrochanteric fracture of right femur, initial encounter for closed fracture [facility]) Active Problems:   Diabetes mellitus with neuropathy (HCC)   Essential hypertension, benign   Atrial fibrillation (HCC)   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, right (HCC)   Chills (without fever)   Falls   Hypokalemia   Acute blood loss as cause of postoperative anemia   Body mass index is 24.21 kg/m.   Right intertrochanteric femur fracture, s/p mechanical fall at home: S/p right hip intramedullary nail on 10/29/2022.  Continue analgesics as needed for pain.  Continue PT and OT while inpatient.  Awaiting placement to SNF    Orthostatic hypotension: Later in the day, patient was orthostatic and complaining of dizziness while working with PT. Per Clydie Braun, PT, sitting BP 123/57, HR 90, Standing ~2 minutes BP 104/68, HR 102.   Repeat orthostatic vital signs later in the day was as follows:  lying BP 125/59 HR 95. Sitting 114/66 HR 109, Standing 109/59 HR 98   Chart review showed that patient had orthostatic hypotension on 12/06/2017.   Acute blood loss anemia: Hemoglobin improved from 7.1-9.  S/p transfusion with 1 unit of PRBCs on 10/31/2022.   Hemoglobin was 12.8 on admission.    Continue ferrous sulfate.    Thrombocytopenia: Resolved   Hypokalemia: Resolved   Chills: Improved.  No fever.  Urinalysis was unremarkable.  No acute abnormality on CT chest.  No indication for course of antibiotics at this time.   Paroxysmal atrial fibrillation: Continue metoprolol and Eliquis   Type II DM with hyperglycemia: Continue insulin glargine 12 units nightly.  Patient was taking glargine 12 units nightly at home.  Continue NovoLog as needed for hyperglycemia.   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 Yes; Fluid consistency: Thin  Diet effective now                            Consultants: Orthopedic surgeon  Procedures: S/p right hip intramedullary nail on 10/29/2022    Medications:    apixaban  5 mg Oral BID   atorvastatin  20 mg Oral QHS   docusate sodium  100 mg Oral BID   feeding supplement  237 mL Oral BID BM   ferrous sulfate  325 mg Oral QODAY   insulin aspart  0-9 Units Subcutaneous TID WC   insulin glargine-yfgn  12 Units Subcutaneous QHS   letrozole  2.5 mg Oral QPM   metoprolol tartrate  12.5 mg  Oral BID   multivitamin with minerals  1 tablet Oral Daily   Continuous Infusions:  methocarbamol (ROBAXIN) IV       Anti-infectives (From admission, onward)    Start     Dose/Rate Route Frequency Ordered Stop   10/29/22 2300  ceFAZolin (ANCEF) IVPB 2g/100 mL premix        2 g 200 mL/hr over 30 Minutes Intravenous Every 6 hours 10/29/22 1838 10/30/22 0604              Family Communication/Anticipated D/C date and plan/Code Status   DVT prophylaxis: SCDs Start: 10/29/22 1839 apixaban (ELIQUIS) tablet 5 mg     Code Status: Full Code  Family Communication: None Disposition Plan: Plan to discharge to SNF   Status  is: Inpatient Remains inpatient appropriate because: Right hip fracture       Subjective:   Interval events noted.  She only has pain in the right hip when she moves around but she is comfortable at rest.    Objective:    Vitals:   11/01/22 0813 11/01/22 1528 11/02/22 0105 11/02/22 0805  BP: 128/61 135/62 133/66 (!) 141/62  Pulse: 94 87 97 97  Resp: 16 17 16 16   Temp: 98.9 F (37.2 C) 98.6 F (37 C) 99.2 F (37.3 C) 98.9 F (37.2 C)  TempSrc:      SpO2: 100% 100% 100% 100%  Weight:      Height:       No data found.   Intake/Output Summary (Last 24 hours) at 11/02/2022 1102 Last data filed at 11/02/2022 0450 Gross per 24 hour  Intake --  Output 0 ml  Net 0 ml   Filed Weights   10/28/22 1638 10/29/22 1514  Weight: 66 kg 66 kg    Exam:  GEN: NAD SKIN: Warm and dry EYES: No pallor or icterus ENT: MMM CV: RRR PULM: CTA B ABD: soft, ND, NT, +BS CNS: AAO x 3, non focal EXT: Dressing right hip surgical wound is clean, dry and intact.  Good movement of bilateral feet and toes.    Data Reviewed:   I have personally reviewed following labs and imaging studies:  Labs: Labs show the following:   Basic Metabolic Panel: Recent Labs  Lab 10/28/22 1641 10/30/22 0552 10/31/22 0359  NA 136 136 135  K 3.4* 4.0 4.0  CL 106 106 107  CO2 22 23 23   GLUCOSE 243* 262* 204*  BUN 20 21 29*  CREATININE 0.97 0.79 0.80  CALCIUM 8.8* 8.1* 8.3*  MG  --  2.4  --    GFR Estimated Creatinine Clearance: 42.9 mL/min (by C-G formula based on SCr of 0.8 mg/dL). Liver Function Tests: Recent Labs  Lab 10/28/22 1641  AST 21  ALT 12  ALKPHOS 46  BILITOT 0.9  PROT 6.4*  ALBUMIN 3.8   No results for input(s): "LIPASE", "AMYLASE" in the last 168 hours. No results for input(s): "AMMONIA" in the last 168 hours. Coagulation profile Recent Labs  Lab 10/28/22 1641  INR 1.4*    CBC: Recent Labs  Lab 10/29/22 0451 10/30/22 0552 10/31/22 0359 11/01/22 0546  11/02/22 0758  WBC 9.9 9.6 9.9 6.5 5.9  NEUTROABS  --  8.3*  --   --   --   HGB 11.3* 8.1* 7.1* 9.1* 9.0*  HCT 34.4* 23.0* 20.8* 26.5* 26.5*  MCV 98.0 93.9 95.9 92.0 92.7  PLT 171 126* 143* 143* 175   Cardiac Enzymes: No results for input(s): "CKTOTAL", "CKMB", "CKMBINDEX", "  TROPONINI" in the last 168 hours. BNP (last 3 results) No results for input(s): "PROBNP" in the last 8760 hours. CBG: Recent Labs  Lab 11/01/22 0814 11/01/22 1121 11/01/22 1619 11/01/22 2137 11/02/22 0805  GLUCAP 132* 138* 184* 115* 164*   D-Dimer: No results for input(s): "DDIMER" in the last 72 hours. Hgb A1c: No results for input(s): "HGBA1C" in the last 72 hours.  Lipid Profile: No results for input(s): "CHOL", "HDL", "LDLCALC", "TRIG", "CHOLHDL", "LDLDIRECT" in the last 72 hours. Thyroid function studies: No results for input(s): "TSH", "T4TOTAL", "T3FREE", "THYROIDAB" in the last 72 hours.  Invalid input(s): "FREET3" Anemia work up: No results for input(s): "VITAMINB12", "FOLATE", "FERRITIN", "TIBC", "IRON", "RETICCTPCT" in the last 72 hours. Sepsis Labs: Recent Labs  Lab 10/28/22 2127 10/29/22 0152 10/29/22 0451 10/30/22 0552 10/31/22 0359 11/01/22 0546 11/02/22 0758  WBC  --   --    < > 9.6 9.9 6.5 5.9  LATICACIDVEN 2.6* 1.1  --   --   --   --   --    < > = values in this interval not displayed.    Microbiology Recent Results (from the past 240 hour(s))  Culture, blood (Routine X 2) w Reflex to ID Panel     Status: None   Collection Time: 10/28/22  9:17 PM   Specimen: BLOOD  Result Value Ref Range Status   Specimen Description BLOOD RIGHT ANTECUBITAL  Final   Special Requests   Final    BOTTLES DRAWN AEROBIC AND ANAEROBIC Blood Culture adequate volume   Culture   Final    NO GROWTH 5 DAYS Performed at [facility], [address] [place], [place] [address]    Report Status 11/02/2022 FINAL  Final  Culture, blood (Routine X 2) w Reflex to ID Panel     Status: None    Collection Time: 10/28/22  9:27 PM   Specimen: BLOOD  Result Value Ref Range Status   Specimen Description BLOOD LEFT ANTECUBITAL  Final   Special Requests   Final    BOTTLES DRAWN AEROBIC AND ANAEROBIC Blood Culture results may not be optimal due to an excessive volume of blood received in culture bottles   Culture   Final    NO GROWTH 5 DAYS Performed at [facility], [address] [place], [place] [address]    Report Status 11/02/2022 FINAL  Final  Urine Culture (for pregnant, neutropenic or urologic patients or patients with an indwelling urinary catheter)     Status: None   Collection Time: 10/29/22  2:49 AM   Specimen: Urine, Clean Catch  Result Value Ref Range Status   Specimen Description   Final    URINE, CLEAN CATCH Performed at [facility], [address] [place], [place] [address]    Special Requests   Final    NONE Performed at [facility], [address], [place], [place] [address]    Culture   Final    NO GROWTH Performed at [facility], [address] [place], [place] [address]    Report Status 10/30/2022 FINAL  Final  Surgical PCR screen     Status: None   Collection Time: 10/29/22  6:34 AM   Specimen: Nasal Mucosa; Nasal Swab  Result Value Ref Range Status   MRSA, PCR NEGATIVE NEGATIVE Final   Staphylococcus aureus NEGATIVE NEGATIVE Final    Comment: (NOTE) The Xpert SA Assay (FDA approved for NASAL specimens in patients 36 years of age and  older), is one component of a comprehensive surveillance program. It is not intended to diagnose infection nor to guide or monitor treatment. Performed at [facility], [address] [place], [place] [address]     Procedures and diagnostic studies:  No results found.             LOS: 5 days      [facility]   Pager on [email or web address]. If 7PM-7AM, please contact night-coverage at [email or web address]     11/02/2022, 11:02 AM

## 2022-11-02 NOTE — Care Management Important Message (Signed)
Important Message  Patient Details  Name: Amber Wolfe MRN: [id number] Date of Birth: 09-08-1931   Medicare Important Message Given:  Yes     Johnell Comings 11/02/2022, 11:42 AM

## 2022-11-02 NOTE — Progress Notes (Signed)
Pt received EOB after working with OT. Continued c/o nausea this session with positional changes. Pt able to work through nausea, however limited progression with gait distance and progressive activity. Pt positioned to comfort with all needs met, will reassess in pm.    11/02/22 1115  PT Visit Information  Assistance Needed +1  History of Present Illness Amber Wolfe is an 89yoF who [facility]28/[facility] after fall at home, tripped, hit head, imaging revealing of right intertrochanteric fracture, underwent Rt hip IM fixation c Dr. Audelia Acton. Pt is WBAT postoperatively. Hb down to 8.1 POD1 v 11.3 preop.  Subjective Data  Patient Stated Goal regain AMB status at rehab prior to return to home.  Precautions  Precautions Fall  Restrictions  Weight Bearing Restrictions Yes  RLE Weight Bearing WBAT  Pain Assessment  Pain Assessment 0-10  Pain Score 5  Pain Location operative hip - declined premedication  Pain Descriptors / Indicators Discomfort;Guarding  Pain Intervention(s) Limited activity within patient's tolerance  Cognition  Arousal/Alertness Awake/alert  Behavior During Therapy WFL for tasks assessed/performed  Overall Cognitive Status History of cognitive impairments - at baseline  General Comments required repetitive cues for one step commands  Bed Mobility  General bed mobility comments  (Pt received sitting EOB finishing up with OT)  Transfers  Overall transfer level Needs assistance  Equipment used Rolling walker (2 wheels)  Transfers Sit to/from Stand  Sit to Stand Min assist  General transfer comment  (c/o nausea increasing with positional changes)  Ambulation/Gait  Ambulation/Gait assistance Min guard;Min assist  Gait Distance (Feet)  (50)  Assistive device Rolling walker (2 wheels)  Gait Pattern/deviations Step-to pattern  General Gait Details  (Progressive c/o nausea as gait distance progressed.)  Gait velocity dec  Balance  Overall balance assessment Needs  assistance  Sitting-balance support Feet supported  Sitting balance-Leahy Scale Good  Sitting balance - Comments reports dizziness.  Standing balance support Bilateral upper extremity supported  Standing balance-Leahy Scale Fair  Standing balance comment reports progressive dizziness  General Comments  General comments (skin integrity, edema, etc.) Pt limited by c/o increasing nausea once up out of bed, progressing with mobility, Hgb 9.0  Exercises  Exercises General Lower Extremity  General Exercises - Lower Extremity  Ankle Circles/Pumps AROM;Strengthening;Both;10 reps;Seated  Hip ABduction/ADduction AROM;Left;AAROM;Right;5 reps;Supine  Long Arc Quad AROM;Both;10 reps;Seated  PT - End of Session  Equipment Utilized During Treatment Gait belt  Activity Tolerance Treatment limited secondary to medical complications (Comment) (Nausea)  Patient left in chair;with call bell/phone within reach;with chair alarm set  Nurse Communication Mobility status   PT - Assessment/Plan  PT Plan Current plan remains appropriate  PT Visit Diagnosis Difficulty in walking, not elsewhere classified (R26.2);Other abnormalities of gait and mobility (R26.89)  PT Frequency (ACUTE ONLY) BID  Follow Up Recommendations Skilled nursing-short term rehab (<3 hours/day)  Can patient physically be transported by private vehicle No  Assistance recommended at discharge Intermittent Supervision/Assistance  Patient can return home with the following A little help with walking and/or transfers;A little help with bathing/dressing/bathroom;Assistance with cooking/housework;Assist for transportation;Help with stairs or ramp for entrance  PT equipment None recommended by PT  AM-PAC PT "6 Clicks" Mobility Outcome Measure (Version 2)  Help needed turning from your back to your side while in a flat bed without using bedrails? 2  Help needed moving from lying on your back to sitting on the side of a flat bed without using  bedrails? 2  Help needed moving to and from a  bed to a chair (including a wheelchair)? 3  Help needed standing up from a chair using your arms (e.g., wheelchair or bedside chair)? 3  Help needed to walk in hospital room? 2  Help needed climbing 3-5 steps with a railing?  1  6 Click Score 13  Consider Recommendation of Discharge To: CIR/SNF/LTACH  Progressive Mobility  What is the highest level of mobility based on the progressive mobility assessment? Level 4 (Walks with assist in room) - Balance while marching in place and cannot step forward and back - Complete  Activity Ambulated with assistance in room  PT Goal Progression  Progress towards PT goals Progressing toward goals  PT Time Calculation  PT Start Time (ACUTE ONLY) 1047  PT Stop Time (ACUTE ONLY) 1115  PT Time Calculation (min) (ACUTE ONLY) 28 min  PT General Charges  $$ ACUTE PT VISIT 1 Visit  PT Treatments  $Gait Training 8-22 mins  $Therapeutic Exercise 8-22 mins  Zadie Cleverly, PTA

## 2022-11-02 NOTE — Progress Notes (Signed)
Physical Therapy Treatment Patient Details Name: Amber Wolfe MRN: [id number] DOB: 02-25-1932 Today's Date: 11/02/2022   History of Present Illness Amber Wolfe is an 89yoF who comes to [facility] 10/28/22 after fall at home, tripped, hit head, imaging revealing of right intertrochanteric fracture, underwent Rt hip IM fixation c Dr. Audelia Acton. Pt is WBAT postoperatively. Hb down to 8.1 POD1 v 11.3 preop.    PT Comments  Pt received in bed, assisted to EOB where she continued to c/o nausea with significant increase in standing. BP assessed and shared with Nursing/MD with concerns of possible Orthostatic Hypotension. Pt unsafe to complete gait training this pm. Assist provided for transfer training to/from [facility] with proper hand placement. Pt assisted back to bed where symptoms resolved. Will continue to progress as pt is able to tolerate. Pt remains motivated to return to baseline LOF.    If plan is discharge home, recommend the following: A little help with walking and/or transfers;A little help with bathing/dressing/bathroom;Assistance with cooking/housework;Assist for transportation;Help with stairs or ramp for entrance   Can travel by private vehicle     No  Equipment Recommendations  None recommended by PT    Recommendations for Other Services       Precautions / Restrictions Precautions Precautions: Fall Restrictions Weight Bearing Restrictions: Yes RLE Weight Bearing: Weight bearing as tolerated     Mobility  Bed Mobility Overal bed mobility: Needs Assistance Bed Mobility: Supine to Sit     Supine to sit: Min assist Sit to supine: Mod assist   General bed mobility comments: Assistance for R LE    Transfers Overall transfer level: Needs assistance Equipment used: Rolling walker (2 wheels) Transfers: Sit to/from Stand Sit to Stand: Min assist   Step pivot transfers: Min assist       General transfer comment:  (Slight struggle to side step towards Right side requiring MinA  for safety)    Ambulation/Gait Ambulation/Gait assistance: Min guard, Min assist Gait Distance (Feet):  (50) Assistive device: Rolling walker (2 wheels) Gait Pattern/deviations: Step-to pattern Gait velocity: dec     General Gait Details:  (Pt unable to tolerate due to significant c/o nausea after standing at bedside for 2 minutes)   Stairs             Wheelchair Mobility     Tilt Bed    Modified Rankin (Stroke Patients Only)       Balance Overall balance assessment: Needs assistance Sitting-balance support: Feet supported Sitting balance-Leahy Scale: Good Sitting balance - Comments:  (c/o nausea)   Standing balance support: Bilateral upper extremity supported Standing balance-Leahy Scale: Fair Standing balance comment:  (Reports progressive nausea in standing)                            Cognition Arousal/Alertness: Awake/alert Behavior During Therapy: WFL for tasks assessed/performed Overall Cognitive Status: History of cognitive impairments - at baseline                                 General Comments: Pt is pleasant and cooperative during session. Follows commands with increased time to process.        Exercises General Exercises - Lower Extremity Ankle Circles/Pumps: AROM, Strengthening, Both, 10 reps, Seated Long Arc Quad: AROM, Both, 10 reps, Seated Hip ABduction/ADduction: AROM, Left, AAROM, Right, 5 reps, Supine    General Comments General comments (skin integrity, edema,  etc.): Seated BP 123/57, HR 90   Standing after ~2 minutes with progressive nausea BP 104/68, HR 103.      Pertinent Vitals/Pain Pain Assessment Pain Assessment: 0-10 Pain Score: 6  Pain Location: operative hip Pain Descriptors / Indicators: Discomfort Pain Intervention(s): Limited activity within patient's tolerance    Home Living                          Prior Function            PT Goals (current goals can now be found in  the care plan section) Acute Rehab PT Goals Patient Stated Goal: regain AMB status at rehab prior to return to home.    Frequency    BID      PT Plan Current plan remains appropriate    Co-evaluation              AM-PAC PT "6 Clicks" Mobility   Outcome Measure  Help needed turning from your back to your side while in a flat bed without using bedrails?: A Lot Help needed moving from lying on your back to sitting on the side of a flat bed without using bedrails?: A Lot Help needed moving to and from a bed to a chair (including a wheelchair)?: A Little Help needed standing up from a chair using your arms (e.g., wheelchair or bedside chair)?: A Little Help needed to walk in hospital room?: A Lot Help needed climbing 3-5 steps with a railing? : Total 6 Click Score: 13    End of Session Equipment Utilized During Treatment: Gait belt Activity Tolerance: Treatment limited secondary to medical complications (Comment) (Nausea, BP concerns with position changes) Patient left: in bed;with call bell/phone within reach;with bed alarm set;with family/visitor present Nurse Communication: Mobility status;Other (comment) (Progressive nausea, BP concerns) PT Visit Diagnosis: Difficulty in walking, not elsewhere classified (R26.2);Other abnormalities of gait and mobility (R26.89)     Time: 1308-6578 PT Time Calculation (min) (ACUTE ONLY): 29 min  Charges:    $Therapeutic Exercise: 8-22 mins $Therapeutic Activity: 8-22 mins PT General Charges $$ ACUTE PT VISIT: 1 Visit                    Zadie Cleverly, PTA  Jannet Askew 11/02/2022, 4:19 PM

## 2022-11-02 NOTE — TOC Progression Note (Addendum)
Transition of Care [facility]) - Progression Note    Patient Details  Name: Amber Wolfe MRN: [id number] Date of Birth: 04-08-1931  [facility]) CM/SW Contact  Marlowe Sax, RN Phone Number: 11/02/2022, 9:25 AM  Clinical Narrative:    Spoke with the patient and she asked me to call her daughter [name] I called [name] at [phone], reviewed the bed offers, they chose [facility], I notified [place], and Started the Ins process, Ins pending ref number [id number]   Expected Discharge Plan: Skilled Nursing Facility Barriers to Discharge: No Barriers Identified  Expected Discharge Plan and Services   Discharge Planning Services: CM Consult Post Acute Care Choice: Skilled Nursing Facility Living arrangements for the past 2 months: Single Family Home                                       Social Determinants of Health (SDOH) Interventions SDOH Screenings   Food Insecurity: No Food Insecurity (10/29/2022)  Housing: Low Risk  (10/28/2022)  Transportation Needs: No Transportation Needs (10/28/2022)  Utilities: Not At Risk (10/29/2022)  Financial Resource Strain: Low Risk  (11/02/2017)  Physical Activity: Inactive (11/02/2017)  Social Connections: Unknown (11/02/2017)  Stress: Stress Concern Present (11/02/2017)  Tobacco Use: Low Risk  (10/29/2022)    Readmission Risk Interventions     No data to display

## 2022-11-02 NOTE — Progress Notes (Signed)
Subjective: 4 Days Post-Op Procedure(s) (LRB): INTRAMEDULLARY (IM) NAIL INTERTROCHANTERIC (Right) Patient reports pain as mild.   Patient is well, and has had no acute complaints or problems Denies any CP, SOB, ABD pain. - BM We will continue with therapy today.  Plan is to go to SNF after discharge  Objective: Vital signs in last 24 hours: Temp:  [98.6 F (37 C)-99.2 F (37.3 C)] 99.2 F (37.3 C) (08/05 0105) Pulse Rate:  [87-97] 97 (08/05 0105) Resp:  [16-17] 16 (08/05 0105) BP: (128-135)/(61-66) 133/66 (08/05 0105) SpO2:  [100 %] 100 % (08/05 0105)  Intake/Output from previous day: 08/04 0701 - 08/05 0700 In: 60 [P.O.:60] Out: 0  Intake/Output this shift: No intake/output data recorded.  Recent Labs    10/31/22 0359 11/01/22 0546  HGB 7.1* 9.1*   Recent Labs    10/31/22 0359 11/01/22 0546  WBC 9.9 6.5  RBC 2.17* 2.88*  HCT 20.8* 26.5*  PLT 143* 143*   Recent Labs    10/31/22 0359  NA 135  K 4.0  CL 107  CO2 23  BUN 29*  CREATININE 0.80  GLUCOSE 204*  CALCIUM 8.3*   No results for input(s): "LABPT", "INR" in the last 72 hours.   EXAM General - Patient is Alert, Appropriate, and Oriented Extremity - Neurovascular intact Sensation intact distally Intact pulses distally Dorsiflexion/Plantar flexion intact No cellulitis present Compartment soft Dressing - dressing C/D/I and no drainage Motor Function - intact, moving foot and toes well on exam.   Past Medical History:  Diagnosis Date   Arthritis    In hands   Atrial flutter (HCC)    a. s/p successful TEE/DCCV 07/31/2015; b. 01/2016 Amio added for Afib; c. 11/2017 Amio d/c'd 2/2 bradycardia; d. 12/2018 Back in aflutter-->Zio: Avg HR 74 (41-176) 100% Afl->bb added back; e. CHADS2VASc => 6 (CHF, HTN, age x 2, DM, female)-->eliquis 2.5 bid.   Bradycardia    a. 11/2017 noted during hospitalization @ [facility]-->amio d/c'd by cardiology.   Cataract    Chronic combined systolic (congestive) and diastolic  (congestive) heart failure (HCC)    a. 06/2015 Echo: EF 40%, basal HK, nl WM of mid and apical segments, mild to mod MR/TR; b. TEE 07/31/2015: nl LV systolic function, mild to mod MR, trivial TR; c. 06/2017 Echo: EF 55-60%, mild LVH, Gr2 DD, triv AI, mild MR, mod dil LA, mildly dil RA, mild to mod TR. PASP 35-73mmHg; d. 01/2019 Echo: Ef 55-60%, mod dil LA mildly dil RA. Mild TR. Trace MR.   Diabetes mellitus without complication (HCC)    Type II   GERD (gastroesophageal reflux disease)    Glaucoma    Right Eye   Hematoma of abdominal wall    a. 11/2017 Fall-->R flank hematoma w/ anemia req PRBC. Xarelto dc'd at that time.   History of SCC (squamous cell carcinoma) of skin 08/20/2020   left forearm EDC   Hyperlipidemia    Hypertension    Moderate mitral regurgitation    a. see echo from 06/2015 and TEE 07/2015; b. 06/2017 Echo: Mild MR.   PAF (paroxysmal atrial fibrillation) (HCC)    a.  01/2016 Admitted to [facility] with PAF -->amio added (later dc'd 11/2017 2/2 bradycardia).   Right renal mass    a. 11/2017 CT [facility]): 3.4cm R upper pole renal mass concerning for renal cell carcinoma-->outpt f/u w/ urology.    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   Weight as of this encounter: 66 kg. Advance diet Up with therapy Pain well controlled Acute post op blood loss anemia - s/p 1 unit PRBC 10/31/22. CBC pending  this am. VSS CM to assist with discharge to SNF, patient requesting [facility]  Follow up with [facility] in 2 weeks    DVT Prophylaxis - TED hose SCDs Eliquis Weight-Bearing as tolerated to right leg   T. Cranston Neighbor, PA-C [facility] 11/02/2022, 7:22 AM

## 2022-11-02 NOTE — Progress Notes (Signed)
Occupational Therapy Treatment Patient Details Name: Amber Wolfe MRN: [id number] DOB: 01/21/1932 Today's Date: 11/02/2022   History of present illness Amber Wolfe is an 89yoF who comes to [facility] 10/28/22 after fall at home, tripped, hit head, imaging revealing of right intertrochanteric fracture, underwent Rt hip IM fixation c Dr. Audelia Acton. Pt is WBAT postoperatively. Hb down to 8.1 POD1 v 11.3 preop.   OT comments  Upon entering the room, pt supine in bed and agreeable to OT intervention. Pt performing supine >sit with min A to EOB. Pt sits on EOB for self care needs and washes and dresses UB with set up A and supervision for balance. Pt stands with min A and use of RW. Pt able to perform peri hygiene with min A for balance in standing. Pt returns to sit on EOB and needing assistance to thread LB clothing onto R foot. PT arrives to room and pt then transitions to PT session without issue. All needs within reach.    Recommendations for follow up therapy are one component of a multi-disciplinary discharge planning process, led by the attending physician.  Recommendations may be updated based on patient status, additional functional criteria and insurance authorization.    Assistance Recommended at Discharge Frequent or constant Supervision/Assistance  Patient can return home with the following  A lot of help with walking and/or transfers;A lot of help with bathing/dressing/bathroom;Assistance with cooking/housework;Assist for transportation;Help with stairs or ramp for entrance   Equipment Recommendations  Other (comment) (defer to next venue of care)       Precautions / Restrictions Precautions Precautions: Fall Restrictions Weight Bearing Restrictions: Yes RLE Weight Bearing: Weight bearing as tolerated       Mobility Bed Mobility Overal bed mobility: Needs Assistance Bed Mobility: Supine to Sit     Supine to sit: Min assist          Transfers Overall transfer level: Needs  assistance Equipment used: Rolling walker (2 wheels) Transfers: Sit to/from Stand Sit to Stand: Min assist                 Balance Overall balance assessment: Needs assistance Sitting-balance support: Feet supported Sitting balance-Leahy Scale: Good     Standing balance support: Bilateral upper extremity supported Standing balance-Leahy Scale: Fair                             ADL either performed or assessed with clinical judgement   ADL Overall ADL's : Needs assistance/impaired                 Upper Body Dressing : Supervision/safety;Set up;Sitting   Lower Body Dressing: Minimal assistance;Sit to/from stand                      Extremity/Trunk Assessment Upper Extremity Assessment Upper Extremity Assessment: Generalized weakness   Lower Extremity Assessment Lower Extremity Assessment: Generalized weakness        Vision Patient Visual Report: No change from baseline            Cognition Arousal/Alertness: Awake/alert Behavior During Therapy: WFL for tasks assessed/performed Overall Cognitive Status: History of cognitive impairments - at baseline                                 General Comments: Pt is pleasant and cooperative during session. Follows commands with increased time to process.  Pertinent Vitals/ Pain       Pain Assessment Pain Assessment: Faces Faces Pain Scale: Hurts little more Pain Location: operative hip Pain Descriptors / Indicators: Discomfort Pain Intervention(s): Monitored during session, Premedicated before session, Repositioned         Frequency  Min 1X/week        Progress Toward Goals  OT Goals(current goals can now be found in the care plan section)  Progress towards OT goals: Progressing toward goals     Plan Discharge plan remains appropriate;Frequency remains appropriate       AM-PAC OT "6 Clicks" Daily Activity     Outcome Measure   Help from  another person eating meals?: None Help from another person taking care of personal grooming?: A Little Help from another person toileting, which includes using toliet, bedpan, or urinal?: A Lot Help from another person bathing (including washing, rinsing, drying)?: A Lot Help from another person to put on and taking off regular upper body clothing?: A Little Help from another person to put on and taking off regular lower body clothing?: A Lot 6 Click Score: 16    End of Session Equipment Utilized During Treatment: Rolling walker (2 wheels)  OT Visit Diagnosis: Unsteadiness on feet (R26.81);Repeated falls (R29.6);Muscle weakness (generalized) (M62.81)   Activity Tolerance Patient tolerated treatment well   Patient Left with call bell/phone within reach;with bed alarm set;Other (comment) (transitioned to PT session)   Nurse Communication Mobility status        Time: 8413-2440 OT Time Calculation (min): 17 min  Charges: OT General Charges $OT Visit: 1 Visit OT Treatments $Self Care/Home Management : 8-22 mins  Jackquline Denmark, MS, OTR/L , CBIS ascom [phone]  11/02/22, 12:45 PM

## 2022-11-03 DIAGNOSIS — S72141A Displaced intertrochanteric fracture of right femur, initial encounter for closed fracture: Secondary | ICD-10-CM | POA: Diagnosis not present

## 2022-11-03 LAB — GLUCOSE, CAPILLARY
Glucose-Capillary: 169 mg/dL — ABNORMAL HIGH (ref 70–99)
Glucose-Capillary: 192 mg/dL — ABNORMAL HIGH (ref 70–99)

## 2022-11-03 MED ORDER — FERROUS SULFATE 325 (65 FE) MG PO TABS: 325.0000 mg | ORAL_TABLET | ORAL | Status: DC

## 2022-11-03 MED ORDER — DOCUSATE SODIUM 100 MG PO CAPS: 100.0000 mg | ORAL_CAPSULE | Freq: Two times a day (BID) | ORAL | Status: DC | PRN

## 2022-11-03 NOTE — Progress Notes (Signed)
Report given to Maddock at [facility] facility. Waiting on transport by EMS.

## 2022-11-03 NOTE — Plan of Care (Signed)
  Problem: Education: Goal: Knowledge of General Education information will improve Description: Including pain rating scale, medication(s)/side effects and non-pharmacologic comfort measures Outcome: Progressing   Problem: Health Behavior/Discharge Planning: Goal: Ability to manage health-related needs will improve Outcome: Progressing   Problem: Clinical Measurements: Goal: Ability to maintain clinical measurements within normal limits will improve Outcome: Progressing Goal: Will remain free from infection Outcome: Progressing Goal: Diagnostic test results will improve Outcome: Progressing Goal: Respiratory complications will improve Outcome: Progressing Goal: Cardiovascular complication will be avoided Outcome: Progressing   Problem: Activity: Goal: Risk for activity intolerance will decrease Outcome: Progressing   Problem: Nutrition: Goal: Adequate nutrition will be maintained Outcome: Progressing   Problem: Coping: Goal: Level of anxiety will decrease Outcome: Progressing   Problem: Elimination: Goal: Will not experience complications related to bowel motility Outcome: Progressing Goal: Will not experience complications related to urinary retention Outcome: Progressing   Problem: Pain Managment: Goal: General experience of comfort will improve Outcome: Progressing   Problem: Safety: Goal: Ability to remain free from injury will improve Outcome: Progressing   Problem: Skin Integrity: Goal: Risk for impaired skin integrity will decrease Outcome: Progressing   Problem: Education: Goal: Ability to describe self-care measures that may prevent or decrease complications (Diabetes Survival Skills Education) will improve Outcome: Progressing Goal: Individualized Educational Video(s) Outcome: Progressing   Problem: Coping: Goal: Ability to adjust to condition or change in health will improve Outcome: Progressing   Problem: Fluid Volume: Goal: Ability to  maintain a balanced intake and output will improve Outcome: Progressing   Problem: Health Behavior/Discharge Planning: Goal: Ability to identify and utilize available resources and services will improve Outcome: Progressing Goal: Ability to manage health-related needs will improve Outcome: Progressing   Problem: Metabolic: Goal: Ability to maintain appropriate glucose levels will improve Outcome: Progressing   Problem: Nutritional: Goal: Maintenance of adequate nutrition will improve Outcome: Progressing Goal: Progress toward achieving an optimal weight will improve Outcome: Progressing   Problem: Skin Integrity: Goal: Risk for impaired skin integrity will decrease Outcome: Progressing   Problem: Tissue Perfusion: Goal: Adequacy of tissue perfusion will improve Outcome: Progressing   Problem: Education: Goal: Verbalization of understanding the information provided (i.e., activity precautions, restrictions, etc) will improve Outcome: Progressing Goal: Individualized Educational Video(s) Outcome: Progressing   Problem: Activity: Goal: Ability to ambulate and perform ADLs will improve Outcome: Progressing   Problem: Clinical Measurements: Goal: Postoperative complications will be avoided or minimized Outcome: Progressing   Problem: Self-Concept: Goal: Ability to maintain and perform role responsibilities to the fullest extent possible will improve Outcome: Progressing   Problem: Pain Management: Goal: Pain level will decrease Outcome: Progressing   

## 2022-11-03 NOTE — TOC Progression Note (Signed)
Transition of Care [facility]) - Progression Note    Patient Details  Name: Amber Wolfe MRN: [id number] Date of Birth: 09/19/1931  Transition of Care [facility]) CM/SW Contact  Marlowe Sax, RN Phone Number: 11/03/2022, 10:29 AM  Clinical Narrative:     Ins approved to go to [place], [id number]  Expected Discharge Plan: Skilled Nursing Facility Barriers to Discharge: No Barriers Identified  Expected Discharge Plan and Services   Discharge Planning Services: CM Consult Post Acute Care Choice: Skilled Nursing Facility Living arrangements for the past 2 months: Single Family Home                                       Social Determinants of Health (SDOH) Interventions SDOH Screenings   Food Insecurity: No Food Insecurity (10/29/2022)  Housing: Low Risk  (10/28/2022)  Transportation Needs: No Transportation Needs (10/28/2022)  Utilities: Not At Risk (10/29/2022)  Financial Resource Strain: Low Risk  (11/02/2017)  Physical Activity: Inactive (11/02/2017)  Social Connections: Unknown (11/02/2017)  Stress: Stress Concern Present (11/02/2017)  Tobacco Use: Low Risk  (10/29/2022)    Readmission Risk Interventions     No data to display

## 2022-11-03 NOTE — Discharge Summary (Signed)
Physician Discharge Summary  Amber Wolfe BTD:[id number] DOB: [birth year] DOA: 10/28/2022  PCP: Armando Gang, FNP  Admit date: 10/28/2022 Discharge date: 11/03/2022  Admitted From: Home Disposition:  SNF [facility])  Recommendations for Outpatient Follow-up:  Follow up with PCP in 1-2 weeks Follow up with orthopedics 1-2 weeks  Home Health:No  Equipment/Devices:None   Discharge Condition:Stable  CODE STATUS:FULL  Diet recommendation: Reg  Brief/Interim Summary: Amber Wolfe is a 89 y.o. 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  She said she tripped while walking from her kitchen.  She did not lose consciousness.   She was found to have left hip fracture.  Status post intramedullary nail on 8/1.  Pain well-controlled postoperatively.  Stable for discharge to skilled nursing facility.    Discharge Diagnoses:  Principal Problem:   Displaced intertrochanteric fracture of right femur, initial encounter for closed fracture [facility]) Active Problems:   Diabetes mellitus with neuropathy (HCC)   Essential hypertension, benign   Atrial fibrillation (HCC)   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, right (HCC)   Chills (without fever)   Falls   Hypokalemia   Acute blood loss as cause of postoperative anemia  Right intertrochanteric femur fracture, s/p mechanical fall at home: S/p right hip intramedullary nail on 10/29/2022.  Pain well-controlled at time of discharge.  Appropriate for discharge to skilled nursing facility.  Follow-up outpatient PCP and orthopedics     Acute postoperative blood loss anemia: Hemoglobin improved from 7.1-9.  S/p transfusion with 1 unit of PRBCs on 10/31/2022.   Hemoglobin was 12.8 on admission.    Continue ferrous sulfate on discharge     Thrombocytopenia: Resolved     Hypokalemia: Resolved     Chills: Improved.  No fever.  Urinalysis was unremarkable.  No acute abnormality on CT chest.  No indication for course of antibiotics at this time.     Paroxysmal atrial fibrillation: Continue metoprolol and Eliquis     Type II DM with hyperglycemia: Continue insulin glargine 12 units nightly.  Patient was taking glargine 12 units nightly at home.  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  Home antihypertensive regimen resumed on discharge.  Please reevaluate for appropriateness.  Ensure pain control.    Discharge Instructions  Discharge Instructions     Diet - low sodium heart healthy   Complete by: As directed    Increase activity slowly   Complete by: As directed       Allergies as of 11/03/2022   No Known Allergies      Medication List     TAKE these medications    acetaminophen 325 MG tablet Commonly known as: TYLENOL Take 1-2 tablets (325-650 mg total) by mouth every 6 (six) hours as needed for mild pain (pain score 1-3 or temp > 100.5).   alendronate 70 MG tablet Commonly known as: FOSAMAX Take 1 tablet (70 mg total) by mouth once a week. Take with a full glass of water on an empty stomach.   amLODipine 10 MG tablet Commonly known as: NORVASC Take 1 tablet (10 mg total) by mouth  daily.   apixaban 5 MG Tabs tablet Commonly known as: Eliquis Take 1 tablet (5 mg total) by mouth 2 (two) times daily.   ascorbic acid 1000 MG tablet Commonly known as: VITAMIN C Take 1,000 mg by mouth daily.   atorvastatin 20 MG tablet Commonly known as: LIPITOR Take 1 tablet (20 mg total) by mouth at bedtime. Office visit needed for additional refill   b complex vitamins capsule Take 1  capsule by mouth daily.   Blood Pressure Kit Check blood pressure daily or as needed if tired or fatigued; dx I10   docusate sodium 100 MG capsule Commonly known as: COLACE Take 1 capsule (100 mg total) by mouth 2 (two) times daily as needed for mild constipation.   ferrous sulfate 325 (65 FE) MG tablet Take 1 tablet (325 mg total) by mouth every other day. Start taking on: November 05, 2022   furosemide 20 MG tablet Commonly known as: LASIX Take 1 tablet (20 mg total) by mouth daily.   HYDROcodone-acetaminophen 5-325 MG tablet Commonly known as: NORCO/VICODIN Take 1 tablet by mouth every 8 (eight) hours as needed for moderate pain (pain score 4-6).   insulin glargine 100 UNIT/ML injection Commonly known as: LANTUS Inject 12 Units into the skin at bedtime.   letrozole 2.5 MG tablet Commonly known as: FEMARA Take 1 tablet (2.5 mg total) by mouth daily. What changed: when to take this   lisinopril 40 MG tablet Commonly known as: ZESTRIL Take 1 tablet (40 mg total) by mouth daily.   metoprolol tartrate 25 MG tablet Commonly known as: LOPRESSOR TAKE (1/2) TABLET TWICE DAILY.   Vitamin D (Ergocalciferol) 1.25 MG (50000 UNIT) Caps capsule Commonly known as: DRISDOL Take 50,000 Units by mouth once a week.        Contact information for follow-up providers     Evon Slack, PA-C Follow up in 2 week(s).   Specialties: Orthopedic Surgery, Emergency Medicine Contact information: [address] [place] [address] [phone]              Contact information for after-discharge care     Destination     [facility] .   Service: Skilled Nursing Contact information: [address] [place] [address] [phone]                    No Known Allergies  Consultations: Orthopedics   Procedures/Studies: DG HIP UNILAT WITH PELVIS 2-3 VIEWS RIGHT  Result Date: 10/29/2022 CLINICAL DATA:  Elective surgery.  EXAM: DG HIP (WITH OR WITHOUT PELVIS) 2-3V RIGHT COMPARISON:  Preoperative radiograph yesterday FINDINGS: Four fluoroscopic spot views of the right hip obtained in the operating room. Intramedullary nail with trans trochanteric and distal locking screw fixation traverse intertrochanteric femur fracture. Fluoroscopy dose 17.71 mGy, fluoroscopy time 1 minutes 38 seconds. IMPRESSION: Intraoperative fluoroscopy during right hip ORIF. Electronically Signed   By: Narda Rutherford M.D.   On: 10/29/2022 18:26   DG C-Arm 1-60 Min-No Report  Result Date: 10/29/2022 Fluoroscopy was utilized by the requesting physician.  No radiographic interpretation.   CT Angio Chest Pulmonary Embolism (PE) W or WO Contrast  Result Date: 10/28/2022 CLINICAL DATA:  Fall EXAM: CT ANGIOGRAPHY CHEST WITH CONTRAST TECHNIQUE: Multidetector CT imaging of the chest was performed using the standard protocol during bolus administration of intravenous contrast. Multiplanar CT image reconstructions and MIPs were obtained to evaluate the vascular anatomy. RADIATION DOSE REDUCTION: This exam was performed according to the  departmental dose-optimization program which includes automated exposure control, adjustment of the mA and/or kV according to patient size and/or use of iterative reconstruction technique. CONTRAST:  75mL OMNIPAQUE IOHEXOL 350 MG/ML SOLN COMPARISON:  Chest x-ray 04/29/2022, chest CT 12/06/2017 FINDINGS: Cardiovascular: Satisfactory opacification of the pulmonary arteries to the segmental level. No evidence of pulmonary embolism. Moderate aortic atherosclerosis. No aneurysm. Coronary vascular calcification. Borderline cardiac enlargement. No pericardial effusion Mediastinum/Nodes: Midline trachea. No thyroid mass. No suspicious lymph nodes. Small moderate hiatal hernia Lungs/Pleura: Progression of reticular disease at the apices consistent with fibrosis. Diffuse bilateral mostly subpleural reticular change slightly progressed. No  acute airspace disease, pleural effusion or pneumothorax Upper Abdomen: No acute finding Musculoskeletal: No acute or suspicious osseous abnormality. Review of the MIP images confirms the above findings. IMPRESSION: 1. Negative for acute pulmonary embolus or aortic dissection. 2. Progression of fibrotic changes in the lungs. 3. Aortic atherosclerosis. Aortic Atherosclerosis (ICD10-I70.0). Electronically Signed   By: Jasmine Pang M.D.   On: 10/28/2022 21:52   DG FEMUR, MIN 2 VIEWS RIGHT  Result Date: 10/28/2022 CLINICAL DATA:  Fall with right hip fracture EXAM: RIGHT FEMUR 2 VIEWS COMPARISON:  10/28/2022 FINDINGS: Only the mid to distal femur is included. There is no fracture or malalignment. Moderate severe tricompartment arthritis of the knee, worst involving the medial joint space. Vascular calcifications IMPRESSION: No acute osseous abnormality. Electronically Signed   By: Jasmine Pang M.D.   On: 10/28/2022 21:32   CT Head Wo Contrast  Result Date: 10/28/2022 CLINICAL DATA:  Head trauma, intracranial venous injury suspected; Facial trauma, blunt EXAM: CT HEAD WITHOUT CONTRAST CT CERVICAL SPINE WITHOUT CONTRAST TECHNIQUE: Multidetector CT imaging of the head and cervical spine was performed following the standard protocol without intravenous contrast. Multiplanar CT image reconstructions of the cervical spine were also generated. RADIATION DOSE REDUCTION: This exam was performed according to the departmental dose-optimization program which includes automated exposure control, adjustment of the mA and/or kV according to patient size and/or use of iterative reconstruction technique. COMPARISON:  CT Head and C Spine 12/06/17 FINDINGS: CT HEAD FINDINGS Brain: No evidence of acute infarction, hemorrhage, hydrocephalus, extra-axial collection or mass lesion/mass effect. Sequela of moderate chronic microvascular ischemic change. 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 Vascular: No hyperdense vessel or  unexpected calcification. Skull: Normal. Negative for fracture or focal lesion. Sinuses/Orbits: No middle ear or mastoid effusion. Paranasal sinuses are clear. Bilateral lens replacement. Orbits are otherwise unremarkable. Other: None. CT CERVICAL SPINE FINDINGS Alignment: Grade 1 anterolisthesis of C3 on C4 and C7 on T1. Skull base and vertebrae: No acute fracture. No primary bone lesion or focal pathologic process. Soft tissues and spinal canal: No prevertebral fluid or swelling. No visible canal hematoma. Disc levels:  No evidence of high-grade spinal canal stenosis. Upper chest: Bilateral subpleural reticulations can be seen in the setting of chronic interstitial lung disease. Other: Calcified atherosclerotic plaque of the carotid bifurcations bilaterally. IMPRESSION: 1. No CT evidence of intracranial injury. 2. No acute fracture or traumatic malalignment of the cervical spine. Electronically Signed   By: Lorenza Cambridge M.D.   On: 10/28/2022 17:41   CT Cervical Spine Wo Contrast  Result Date: 10/28/2022 CLINICAL DATA:  Head trauma, intracranial venous injury suspected; Facial trauma, blunt EXAM: CT HEAD WITHOUT CONTRAST CT CERVICAL SPINE WITHOUT CONTRAST TECHNIQUE: Multidetector CT imaging of the head and cervical spine was performed following the standard protocol without intravenous contrast. Multiplanar CT image reconstructions of the cervical spine were also generated. RADIATION DOSE REDUCTION:  This exam was performed according to the departmental dose-optimization program which includes automated exposure control, adjustment of the mA and/or kV according to patient size and/or use of iterative reconstruction technique. COMPARISON:  CT Head and C Spine 12/06/17 FINDINGS: CT HEAD FINDINGS Brain: No evidence of acute infarction, hemorrhage, hydrocephalus, extra-axial collection or mass lesion/mass effect. Sequela of moderate chronic microvascular ischemic change. 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  Vascular: No hyperdense vessel or unexpected calcification. Skull: Normal. Negative for fracture or focal lesion. Sinuses/Orbits: No middle ear or mastoid effusion. Paranasal sinuses are clear. Bilateral lens replacement. Orbits are otherwise unremarkable. Other: None. CT CERVICAL SPINE FINDINGS Alignment: Grade 1 anterolisthesis of C3 on C4 and C7 on T1. Skull base and vertebrae: No acute fracture. No primary bone lesion or focal pathologic process. Soft tissues and spinal canal: No prevertebral fluid or swelling. No visible canal hematoma. Disc levels:  No evidence of high-grade spinal canal stenosis. Upper chest: Bilateral subpleural reticulations can be seen in the setting of chronic interstitial lung disease. Other: Calcified atherosclerotic plaque of the carotid bifurcations bilaterally. IMPRESSION: 1. No CT evidence of intracranial injury. 2. No acute fracture or traumatic malalignment of the cervical spine. Electronically Signed   By: Lorenza Cambridge M.D.   On: 10/28/2022 17:41   DG Chest 1 View  Result Date: 10/28/2022 CLINICAL DATA:  Right hip deformity.  Trauma EXAM: CHEST  1 VIEW COMPARISON:  X-ray 12/06/2017 FINDINGS: Calcified aorta. Film is rotated to the left. No consolidation, pneumothorax or effusion. No edema. Slight thickening along the minor fissure. Questionable nodule left midlung overlying the posterior aspect of the left seventh rib. Degenerative changes of the spine. IMPRESSION: Small nodular density along the left midthorax. This could be summation of shadow. Recommend standard two-view x-ray to further delineate or CT when appropriate Electronically Signed   By: Karen Kays M.D.   On: 10/28/2022 17:35   DG Hip Unilat W or Wo Pelvis 2-3 Views Right  Result Date: 10/28/2022 CLINICAL DATA:  Pain after fall EXAM: DG HIP (WITH OR WITHOUT PELVIS) 3V RIGHT COMPARISON:  None Available. FINDINGS: Comminuted foreshortened and angulated intertrochanteric right hip fracture. No additional  fracture or dislocation. Preserved joint spaces. Osteopenia. Note is made of prominent colonic stool. IMPRESSION: Comminuted foreshortened and angulated intertrochanteric right hip fracture. Electronically Signed   By: Karen Kays M.D.   On: 10/28/2022 17:33      Subjective: Seen and examined on the day of discharge.  Stable no distress.  Pain well-controlled.  Appropriate for discharge to skilled nurse facility.  Discharge Exam: Vitals:   11/02/22 2145 11/03/22 0744  BP: (!) 127/50 128/65  Pulse: 80 79  Resp: 20 18  Temp: 98.9 F (37.2 C) 98.3 F (36.8 C)  SpO2: 100% 93%   Vitals:   11/02/22 1658 11/02/22 2127 11/02/22 2145 11/03/22 0744  BP: (!) 125/59 115/75 (!) 127/50 128/65  Pulse:  97 80 79  Resp:   20 18  Temp:   98.9 F (37.2 C) 98.3 F (36.8 C)  TempSrc:      SpO2:  100% 100% 93%  Weight:      Height:        General: Pt is alert, awake, not in acute distress Cardiovascular: RRR, S1/S2 +, no rubs, no gallops Respiratory: CTA bilaterally, no wheezing, no rhonchi Abdominal: Soft, NT, ND, bowel sounds + Extremities: no edema, no cyanosis    The results of significant diagnostics from this hospitalization (including imaging, microbiology, ancillary and  laboratory) are listed below for reference.     Microbiology: Recent Results (from the past 240 hour(s))  Culture, blood (Routine X 2) w Reflex to ID Panel     Status: None   Collection Time: 10/28/22  9:17 PM   Specimen: BLOOD  Result Value Ref Range Status   Specimen Description BLOOD RIGHT ANTECUBITAL  Final   Special Requests   Final    BOTTLES DRAWN AEROBIC AND ANAEROBIC Blood Culture adequate volume   Culture   Final    NO GROWTH 5 DAYS Performed at [facility], [address] [place], [place] [address]    Report Status 11/02/2022 FINAL  Final  Culture, blood (Routine X 2) w Reflex to ID Panel     Status: None   Collection Time: 10/28/22  9:27 PM   Specimen: BLOOD  Result Value Ref  Range Status   Specimen Description BLOOD LEFT ANTECUBITAL  Final   Special Requests   Final    BOTTLES DRAWN AEROBIC AND ANAEROBIC Blood Culture results may not be optimal due to an excessive volume of blood received in culture bottles   Culture   Final    NO GROWTH 5 DAYS Performed at [facility], [address] [place], [place] [address]    Report Status 11/02/2022 FINAL  Final  Urine Culture (for pregnant, neutropenic or urologic patients or patients with an indwelling urinary catheter)     Status: None   Collection Time: 10/29/22  2:49 AM   Specimen: Urine, Clean Catch  Result Value Ref Range Status   Specimen Description   Final    URINE, CLEAN CATCH Performed at [facility], [address], [place], [place] [address]    Special Requests   Final    NONE Performed at [facility], [address], [place], [place] [address]    Culture   Final    NO GROWTH Performed at [facility], [address] [place], [place] [address]    Report Status 10/30/2022 FINAL  Final  Surgical PCR screen     Status: None   Collection Time: 10/29/22  6:34 AM   Specimen: Nasal Mucosa; Nasal Swab  Result Value Ref Range Status   MRSA, PCR NEGATIVE NEGATIVE Final   Staphylococcus aureus NEGATIVE NEGATIVE Final    Comment: (NOTE) The Xpert SA Assay (FDA approved for NASAL specimens in patients 23 years of age and older), is one component of a comprehensive surveillance program. It is not intended to diagnose infection nor to guide or monitor treatment. Performed at [facility], [address] [place], [place] [address]      Labs: BNP (last 3 results) No results for input(s): "BNP" in the last 8760 hours. Basic Metabolic Panel: Recent Labs  Lab 10/28/22 1641 10/30/22 0552 10/31/22 0359  NA 136 136 135  K 3.4* 4.0 4.0  CL 106 106 107  CO2 22 23 23   GLUCOSE 243* 262* 204*  BUN 20 21 29*  CREATININE 0.97 0.79 0.80  CALCIUM 8.8* 8.1*  8.3*  MG  --  2.4  --    Liver Function Tests: Recent Labs  Lab 10/28/22 1641  AST 21  ALT 12  ALKPHOS 46  BILITOT 0.9  PROT 6.4*  ALBUMIN 3.8   No results for input(s): "LIPASE", "AMYLASE" in the last 168 hours. No results for input(s): "AMMONIA" in the last 168 hours. CBC: Recent Labs  Lab 10/29/22 0451 10/30/22 5784 10/31/22 0359 11/01/22 6962  11/02/22 0758  WBC 9.9 9.6 9.9 6.5 5.9  NEUTROABS  --  8.3*  --   --   --   HGB 11.3* 8.1* 7.1* 9.1* 9.0*  HCT 34.4* 23.0* 20.8* 26.5* 26.5*  MCV 98.0 93.9 95.9 92.0 92.7  PLT 171 126* 143* 143* 175   Cardiac Enzymes: No results for input(s): "CKTOTAL", "CKMB", "CKMBINDEX", "TROPONINI" in the last 168 hours. BNP: Invalid input(s): "POCBNP" CBG: Recent Labs  Lab 11/02/22 0805 11/02/22 1123 11/02/22 1655 11/02/22 2144 11/03/22 0743  GLUCAP 164* 235* 187* 176* 169*   D-Dimer No results for input(s): "DDIMER" in the last 72 hours. Hgb A1c No results for input(s): "HGBA1C" in the last 72 hours. Lipid Profile No results for input(s): "CHOL", "HDL", "LDLCALC", "TRIG", "CHOLHDL", "LDLDIRECT" in the last 72 hours. Thyroid function studies No results for input(s): "TSH", "T4TOTAL", "T3FREE", "THYROIDAB" in the last 72 hours.  Invalid input(s): "FREET3" Anemia work up No results for input(s): "VITAMINB12", "FOLATE", "FERRITIN", "TIBC", "IRON", "RETICCTPCT" in the last 72 hours. Urinalysis    Component Value Date/Time   COLORURINE YELLOW (A) 10/29/2022 0249   APPEARANCEUR CLEAR (A) 10/29/2022 0249   LABSPEC 1.016 10/29/2022 0249   PHURINE 5.0 10/29/2022 0249   GLUCOSEU NEGATIVE 10/29/2022 0249   HGBUR NEGATIVE 10/29/2022 0249   BILIRUBINUR NEGATIVE 10/29/2022 0249   KETONESUR NEGATIVE 10/29/2022 0249   PROTEINUR NEGATIVE 10/29/2022 0249   NITRITE NEGATIVE 10/29/2022 0249   LEUKOCYTESUR NEGATIVE 10/29/2022 0249   Sepsis Labs Recent Labs  Lab 10/30/22 0552 10/31/22 0359 11/01/22 0546 11/02/22 0758  WBC 9.6 9.9  6.5 5.9   Microbiology Recent Results (from the past 240 hour(s))  Culture, blood (Routine X 2) w Reflex to ID Panel     Status: None   Collection Time: 10/28/22  9:17 PM   Specimen: BLOOD  Result Value Ref Range Status   Specimen Description BLOOD RIGHT ANTECUBITAL  Final   Special Requests   Final    BOTTLES DRAWN AEROBIC AND ANAEROBIC Blood Culture adequate volume   Culture   Final    NO GROWTH 5 DAYS Performed at [facility], [address] [place], [place] [address]    Report Status 11/02/2022 FINAL  Final  Culture, blood (Routine X 2) w Reflex to ID Panel     Status: None   Collection Time: 10/28/22  9:27 PM   Specimen: BLOOD  Result Value Ref Range Status   Specimen Description BLOOD LEFT ANTECUBITAL  Final   Special Requests   Final    BOTTLES DRAWN AEROBIC AND ANAEROBIC Blood Culture results may not be optimal due to an excessive volume of blood received in culture bottles   Culture   Final    NO GROWTH 5 DAYS Performed at [facility], [address] [place], [place] [address]    Report Status 11/02/2022 FINAL  Final  Urine Culture (for pregnant, neutropenic or urologic patients or patients with an indwelling urinary catheter)     Status: None   Collection Time: 10/29/22  2:49 AM   Specimen: Urine, Clean Catch  Result Value Ref Range Status   Specimen Description   Final    URINE, CLEAN CATCH Performed at [facility], [address] [place], [place] [address]    Special Requests   Final    NONE Performed at [facility], [address], [place], [place] [address]    Culture   Final    NO GROWTH Performed at [facility],  [address] [place], [place] [address]    Report Status 10/30/2022 FINAL  Final  Surgical PCR screen     Status: None   Collection Time: 10/29/22  6:34 AM   Specimen: Nasal Mucosa; Nasal Swab  Result Value Ref Range Status   MRSA, PCR NEGATIVE NEGATIVE Final   Staphylococcus aureus  NEGATIVE NEGATIVE Final    Comment: (NOTE) The Xpert SA Assay (FDA approved for NASAL specimens in patients 84 years of age and older), is one component of a comprehensive surveillance program. It is not intended to diagnose infection nor to guide or monitor treatment. Performed at [facility], [address], [place], [place] [address]      Time coordinating discharge: Over 30 minutes  SIGNED:   Tresa Moore, MD  [facility] 11/03/2022, 10:32 AM Pager   If 7PM-7AM, please contact night-coverage

## 2022-11-03 NOTE — Plan of Care (Signed)

## 2022-11-03 NOTE — Progress Notes (Signed)
Physical Therapy Treatment Patient Details Name: Amber Wolfe MRN: [id number] DOB: 19-Sep-1931 Today's Date: 11/03/2022   History of Present Illness Amber Wolfe is an 89yoF who comes to [facility] 10/28/22 after fall at home, tripped, hit head, imaging revealing of right intertrochanteric fracture, underwent Rt hip IM fixation c Dr. Audelia Acton. Pt is WBAT postoperatively. Hb down to 8.1 POD1 v 11.3 preop.    PT Comments  Pt received in bed this am. Discussed current goals. Re-assessed BP in supine and sitting without significant change noted, however pt did c/o significant nausea once again in sitting and increasing in standing. Nausea continues to affect tolerance for standing activity. Pt encouraged to increase po intake and fluids. Currently she is only taking Tylenol for pain. Hgb remains at 9.0 since blood transfusion on 8/3. Will continue to monitor and progress as tolerated. Pt remains very motivated to return to functional baseline. Currently awaiting transition to [facility] for continued Rehab prior to returning home.    If plan is discharge home, recommend the following: A little help with walking and/or transfers;A little help with bathing/dressing/bathroom;Assistance with cooking/housework;Assist for transportation;Help with stairs or ramp for entrance   Can travel by private vehicle     No  Equipment Recommendations  None recommended by PT    Recommendations for Other Services       Precautions / Restrictions Precautions Precautions: Fall Restrictions Weight Bearing Restrictions: Yes RLE Weight Bearing: Weight bearing as tolerated     Mobility  Bed Mobility Overal bed mobility: Needs Assistance Bed Mobility: Supine to Sit     Supine to sit: Min assist, HOB elevated     General bed mobility comments: Assistance for R LE    Transfers Overall transfer level: Needs assistance Equipment used: Rolling walker (2 wheels) Transfers: Sit to/from Stand Sit to Stand: Min assist                 Ambulation/Gait Ambulation/Gait assistance: Min guard, Min assist Gait Distance (Feet):  (4) Assistive device: Rolling walker (2 wheels) Gait Pattern/deviations: Step-to pattern Gait velocity: dec     General Gait Details:  (Limited gait tolerance due to c/o significant nausea once sitting EOB and increases with standing)   Stairs             Wheelchair Mobility     Tilt Bed    Modified Rankin (Stroke Patients Only)       Balance Overall balance assessment: Needs assistance Sitting-balance support: Feet supported Sitting balance-Leahy Scale: Good     Standing balance support: Bilateral upper extremity supported Standing balance-Leahy Scale: Fair Standing balance comment: reports progressive nausea                            Cognition Arousal/Alertness: Awake/alert Behavior During Therapy: WFL for tasks assessed/performed Overall Cognitive Status: History of cognitive impairments - at baseline                                 General Comments: Pt is pleasant and cooperative during session. Follows commands with increased time to process. [facility]        Exercises General Exercises - Lower Extremity Ankle Circles/Pumps: AROM, Strengthening, Both, 10 reps, Seated Quad Sets: AROM, Both, 10 reps Long Arc Quad: AROM, Both, 10 reps, Seated    General Comments General comments (skin integrity, edema, etc.): Reviewed LE strengthening exercises, encouraged to sit  up with feet on floor. Pt also educated to lie flat at times due to noted R hip flexion tightness. Pt educated on preparation for rehab transition      Pertinent Vitals/Pain Pain Assessment Pain Assessment: 0-10 Pain Score: 5  Pain Location: operative hip Pain Descriptors / Indicators: Discomfort Pain Intervention(s): Limited activity within patient's tolerance    Home Living                          Prior Function            PT Goals  (current goals can now be found in the care plan section) Acute Rehab PT Goals Patient Stated Goal: regain AMB status at rehab prior to return to home.    Frequency    BID      PT Plan Current plan remains appropriate    Co-evaluation              AM-PAC PT "6 Clicks" Mobility   Outcome Measure  Help needed turning from your back to your side while in a flat bed without using bedrails?: A Lot Help needed moving from lying on your back to sitting on the side of a flat bed without using bedrails?: A Lot Help needed moving to and from a bed to a chair (including a wheelchair)?: A Little Help needed standing up from a chair using your arms (e.g., wheelchair or bedside chair)?: A Little Help needed to walk in hospital room?: A Little Help needed climbing 3-5 steps with a railing? : Total 6 Click Score: 14    End of Session Equipment Utilized During Treatment: Gait belt Activity Tolerance: Treatment limited secondary to medical complications (Comment) (due to significant c/o nausea with positional changes) Patient left: in chair;with call bell/phone within reach;with chair alarm set;with family/visitor present Nurse Communication: Mobility status;Other (comment) PT Visit Diagnosis: Difficulty in walking, not elsewhere classified (R26.2);Other abnormalities of gait and mobility (R26.89)     Time: 1000-1039 PT Time Calculation (min) (ACUTE ONLY): 39 min  Charges:    $Gait Training: 8-22 mins $Therapeutic Exercise: 8-22 mins $Therapeutic Activity: 8-22 mins PT General Charges $$ ACUTE PT VISIT: 1 Visit                    Zadie Cleverly, PTA  Jannet Askew 11/03/2022, 12:08 PM

## 2022-11-03 NOTE — Progress Notes (Signed)
Occupational Therapy Treatment Patient Details Name: Amber Wolfe MRN: [id number] DOB: June 13, 1931 Today's Date: 11/03/2022   History of present illness Amber Wolfe is an 89yoF who comes to [facility] 10/28/22 after fall at home, tripped, hit head, imaging revealing of right intertrochanteric fracture, underwent Rt hip IM fixation c Dr. Audelia Acton. Pt is WBAT postoperatively. Hb down to 8.1 POD1 v 11.3 preop.   OT comments  Upon entering the room, pt seated in recliner chair and reports need for toileting. Pt stands with min A for recliner chair with RW and ambulates 5' to [facility]. Pt able to manage LB clothing with min A for balance. Pt able to void on [facility] and performs hygiene while seated. She stands in same manner as above and returns to recliner chair with plan to sit up for lunch today. Call bell and all needed items within reach upon exiting the room.    Recommendations for follow up therapy are one component of a multi-disciplinary discharge planning process, led by the attending physician.  Recommendations may be updated based on patient status, additional functional criteria and insurance authorization.    Assistance Recommended at Discharge Frequent or constant Supervision/Assistance  Patient can return home with the following  A lot of help with walking and/or transfers;A lot of help with bathing/dressing/bathroom;Assistance with cooking/housework;Assist for transportation;Help with stairs or ramp for entrance (defer to next venue of care)   Equipment Recommendations  Other (comment)       Precautions / Restrictions Precautions Precautions: Fall Restrictions Weight Bearing Restrictions: Yes RLE Weight Bearing: Weight bearing as tolerated       Mobility Bed Mobility               General bed mobility comments: seated in recliner chair    Transfers Overall transfer level: Needs assistance Equipment used: Rolling walker (2 wheels) Transfers: Sit to/from Stand, Bed to  chair/wheelchair/BSC Sit to Stand: Min assist     Step pivot transfers: Min assist           Balance Overall balance assessment: Needs assistance Sitting-balance support: Feet supported Sitting balance-Leahy Scale: Good     Standing balance support: Bilateral upper extremity supported, Reliant on assistive device for balance, During functional activity                               ADL either performed or assessed with clinical judgement   ADL Overall ADL's : Needs assistance/impaired                         Toilet Transfer: Minimal assistance;BSC/3in1;Ambulation;Rolling walker (2 wheels)   Toileting- Clothing Manipulation and Hygiene: Minimal assistance;Sit to/from stand       Functional mobility during ADLs: Min guard;Minimal assistance;Rolling walker (2 wheels)      Extremity/Trunk Assessment Upper Extremity Assessment Upper Extremity Assessment: Generalized weakness   Lower Extremity Assessment Lower Extremity Assessment: Generalized weakness        Vision Patient Visual Report: No change from baseline            Cognition Arousal/Alertness: Awake/alert Behavior During Therapy: WFL for tasks assessed/performed Overall Cognitive Status: History of cognitive impairments - at baseline                                 General Comments: Pt is pleasant and cooperative during session. Follows commands  with increased time to process. HOH              General Comments Reviewed LE strengthening exercises, encouraged to sit up with feet on floor. Pt also educated to lie flat at times due to noted R hip flexion tightness. Pt educated on preparation for rehab transition    Pertinent Vitals/ Pain       Pain Assessment Pain Assessment: 0-10 Pain Score: 5  Pain Location: operative hip Pain Descriptors / Indicators: Discomfort, Aching Pain Intervention(s): Monitored during session, Repositioned  Home Living Family/patient  expects to be discharged to:: Private residence Living Arrangements: Alone Available Help at Discharge: Family;Available PRN/intermittently Type of Home: House             Bathroom Shower/Tub: Tub/shower unit         Home Equipment: Agricultural consultant (2 wheels)              Frequency  Min 1X/week        Progress Toward Goals  OT Goals(current goals can now be found in the care plan section)  Progress towards OT goals: Progressing toward goals     Plan Discharge plan remains appropriate;Frequency remains appropriate       AM-PAC OT "6 Clicks" Daily Activity     Outcome Measure   Help from another person eating meals?: None Help from another person taking care of personal grooming?: A Little Help from another person toileting, which includes using toliet, bedpan, or urinal?: A Little Help from another person bathing (including washing, rinsing, drying)?: A Little Help from another person to put on and taking off regular upper body clothing?: A Little Help from another person to put on and taking off regular lower body clothing?: A Lot 6 Click Score: 18    End of Session Equipment Utilized During Treatment: Rolling walker (2 wheels)  OT Visit Diagnosis: Unsteadiness on feet (R26.81);Repeated falls (R29.6);Muscle weakness (generalized) (M62.81)   Activity Tolerance Patient tolerated treatment well   Patient Left with call bell/phone within reach;with bed alarm set;Other (comment)   Nurse Communication Mobility status        Time: 1100-1117 OT Time Calculation (min): 17 min  Charges: OT General Charges $OT Visit: 1 Visit OT Treatments $Self Care/Home Management : 8-22 mins  Jackquline Denmark, MS, OTR/L , CBIS ascom [phone]  11/03/22, 12:40 PM

## 2022-11-03 NOTE — Progress Notes (Signed)
Subjective: 5 Days Post-Op Procedure(s) (LRB): INTRAMEDULLARY (IM) NAIL INTERTROCHANTERIC (Right) Patient reports pain as mild.   Patient is well, and has had no acute complaints or problems Denies any CP, SOB, ABD pain. We will continue with therapy today.  Plan is to go to SNF today  Objective: Vital signs in last 24 hours: Temp:  [98.3 F (36.8 C)-99 F (37.2 C)] 98.3 F (36.8 C) (08/06 0744) Pulse Rate:  [79-97] 79 (08/06 0744) Resp:  [16-20] 18 (08/06 0744) BP: (115-129)/(50-75) 128/65 (08/06 0744) SpO2:  [93 %-100 %] 93 % (08/06 0744)  Intake/Output from previous day: No intake/output data recorded. Intake/Output this shift: No intake/output data recorded.  Recent Labs    11/01/22 0546 11/02/22 0758  HGB 9.1* 9.0*   Recent Labs    11/01/22 0546 11/02/22 0758  WBC 6.5 5.9  RBC 2.88* 2.86*  HCT 26.5* 26.5*  PLT 143* 175   No results for input(s): "NA", "K", "CL", "CO2", "BUN", "CREATININE", "GLUCOSE", "CALCIUM" in the last 72 hours.  No results for input(s): "LABPT", "INR" in the last 72 hours.   EXAM General - Patient is Alert, Appropriate, and Oriented Extremity - Neurovascular intact Sensation intact distally Intact pulses distally Dorsiflexion/Plantar flexion intact No cellulitis present Compartment soft Dressing - dressing C/D/I and no drainage Motor Function - intact, moving foot and toes well on exam.   Past Medical History:  Diagnosis Date   Arthritis    In hands   Atrial flutter (HCC)    a. s/p successful TEE/DCCV 07/31/2015; b. 01/2016 Amio added for Afib; c. 11/2017 Amio d/c'd 2/2 bradycardia; d. 12/2018 Back in aflutter-->Zio: Avg HR 74 (41-176) 100% Afl->bb added back; e. CHADS2VASc => 6 (CHF, HTN, age x 2, DM, female)-->eliquis 2.5 bid.   Bradycardia    a. 11/2017 noted during hospitalization @ [facility]-->amio d/c'd by cardiology.   Cataract    Chronic combined systolic (congestive) and diastolic (congestive) heart failure (HCC)    a.  06/2015 Echo: EF 40%, basal HK, nl WM of mid and apical segments, mild to mod MR/TR; b. TEE 07/31/2015: nl LV systolic function, mild to mod MR, trivial TR; c. 06/2017 Echo: EF 55-60%, mild LVH, Gr2 DD, triv AI, mild MR, mod dil LA, mildly dil RA, mild to mod TR. PASP 35-7mmHg; d. 01/2019 Echo: Ef 55-60%, mod dil LA mildly dil RA. Mild TR. Trace MR.   Diabetes mellitus without complication (HCC)    Type II   GERD (gastroesophageal reflux disease)    Glaucoma    Right Eye   Hematoma of abdominal wall    a. 11/2017 Fall-->R flank hematoma w/ anemia req PRBC. Xarelto dc'd at that time.   History of SCC (squamous cell carcinoma) of skin 08/20/2020   left forearm EDC   Hyperlipidemia    Hypertension    Moderate mitral regurgitation    a. see echo from 06/2015 and TEE 07/2015; b. 06/2017 Echo: Mild MR.   PAF (paroxysmal atrial fibrillation) (HCC)    a.  01/2016 Admitted to [facility] with PAF -->amio added (later dc'd 11/2017 2/2 bradycardia).   Right renal mass    a. 11/2017 CT Abd [facility]): 3.4cm R upper pole renal mass concerning for renal cell carcinoma-->outpt f/u w/ urology.    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   Weight as of this encounter: 66 kg. Advance diet Up with therapy Pain well controlled Acute post op blood loss anemia - s/p 1 unit PRBC 10/31/22. CBC pending this am. VSS CM to assist with  discharge to SNF today  Follow up with [facility] in 2 weeks  DVT Prophylaxis - TED hose SCDs Eliquis Weight-Bearing as tolerated to right leg   T. Cranston Neighbor, PA-C [facility] 11/03/2022, 8:22 AM

## 2022-11-03 NOTE — TOC Progression Note (Signed)
Transition of Care [facility]) - Progression Note    Patient Details  Name: Amber Wolfe MRN: [id number] Date of Birth: 02/13/1932  Transition [facility]) CM/SW Contact  Marlowe Sax, RN Phone Number: 11/03/2022, 11:31 AM  Clinical Narrative:     Spoke to Daughter [name] and notified her of the DC to [facility] rehab room 105 EMS called and arranged for EMS transport She is 2nd on list  Expected Discharge Plan: Skilled Nursing Facility Barriers to Discharge: No Barriers Identified  Expected Discharge Plan and Services   Discharge Planning Services: CM Consult Post Acute Care Choice: Skilled Nursing Facility Living arrangements for the past 2 months: Single Family Home Expected Discharge Date: 11/03/22                                     Social Determinants of Health (SDOH) Interventions SDOH Screenings   Food Insecurity: No Food Insecurity (10/29/2022)  Housing: Low Risk  (10/28/2022)  Transportation Needs: No Transportation Needs (10/28/2022)  Utilities: Not At Risk (10/29/2022)  Financial Resource Strain: Low Risk  (11/02/2017)  Physical Activity: Inactive (11/02/2017)  Social Connections: Unknown (11/02/2017)  Stress: Stress Concern Present (11/02/2017)  Tobacco Use: Low Risk  (10/29/2022)    Readmission Risk Interventions     No data to display

## 2022-11-04 ENCOUNTER — Non-Acute Institutional Stay (SKILLED_NURSING_FACILITY): Payer: Medicare Other | Admitting: Adult Health

## 2022-11-04 ENCOUNTER — Encounter: Payer: Self-pay | Admitting: Adult Health

## 2022-11-04 DIAGNOSIS — E1140 Type 2 diabetes mellitus with diabetic neuropathy, unspecified: Secondary | ICD-10-CM

## 2022-11-04 DIAGNOSIS — S72141S Displaced intertrochanteric fracture of right femur, sequela: Secondary | ICD-10-CM | POA: Diagnosis not present

## 2022-11-04 DIAGNOSIS — I4821 Permanent atrial fibrillation: Secondary | ICD-10-CM

## 2022-11-04 DIAGNOSIS — C50411 Malignant neoplasm of upper-outer quadrant of right female breast: Secondary | ICD-10-CM

## 2022-11-04 DIAGNOSIS — Z794 Long term (current) use of insulin: Secondary | ICD-10-CM

## 2022-11-04 DIAGNOSIS — D62 Acute posthemorrhagic anemia: Secondary | ICD-10-CM | POA: Diagnosis not present

## 2022-11-04 DIAGNOSIS — M810 Age-related osteoporosis without current pathological fracture: Secondary | ICD-10-CM

## 2022-11-04 DIAGNOSIS — E7849 Other hyperlipidemia: Secondary | ICD-10-CM

## 2022-11-04 NOTE — Progress Notes (Signed)
Location:  Other [place].  Nursing Home Room Number: [facility] [address] Place of Service:  SNF [id number]) Provider:  Kenard Gower, DNP, FNP-BC  Patient Care Team: Armando Gang, FNP as PCP - General (Family Medicine) Iran Ouch, MD as PCP - Cardiology (Cardiology) [place] Ouch, MD as Consulting Physician (Cardiology) Scarlett Presto, RN (Inactive) as Oncology Nurse Navigator  Extended Emergency Contact Information Primary Emergency Contact: Paul,[name] Address: [address]          [place], [place] [address] Darden [place] of [place] Home Phone: [phone] Work Phone: [phone] Mobile Phone: [phone] Relation: Daughter  Code Status:  Full Code.   Goals of care: Advanced Directive information    11/04/2022   10:22 AM  Advanced Directives  Does Patient Have a Medical Advance Directive? Yes  Type of Estate agent of [place];Living will  Does patient want to make changes to medical advance directive? No - Patient declined  Copy of Healthcare Power of Attorney in Chart? Yes - validated most recent copy scanned in chart (See row information)     Chief Complaint  Patient presents with   Hospitalization Follow-up    Hospital Follow up    HPI:  Pt is a 89 y.o. female seen today for Hospital Follow up. 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 e ca right hip fracture for which she had intramedullary nail on 10/29/22. She was hospitalized 10/28/22 to 11/03/22. Post surgey was complicated by anemia, hgb dropped to 7.1, for which she had transfusion of 1 unit PRBC on 10/31/22.  She was seen in her room today.   Past Medical History:  Diagnosis Date   Arthritis    In hands   Atrial flutter (HCC)    a. s/p successful TEE/DCCV 07/31/2015; b. 01/2016 Amio  added for Afib; c. 11/2017 Amio d/c'd 2/2 bradycardia; d. 12/2018 Back in aflutter-->Zio: Avg HR 74 (41-176) 100% Afl->bb added back; e. CHADS2VASc => 6 (CHF, HTN, age x 2, DM, female)-->eliquis 2.5 bid.   Bradycardia    a. 11/2017 noted during hospitalization @ [facility]-->amio d/c'd by cardiology.   Cataract    Chronic combined systolic (congestive) and diastolic (congestive) heart failure (HCC)    a. 06/2015 Echo: EF 40%, basal HK, nl WM of mid and apical segments, mild to mod MR/TR; b. TEE 07/31/2015: nl LV systolic function, mild to mod MR, trivial TR; c. 06/2017 Echo: EF 55-60%, mild LVH, Gr2 DD, triv AI, mild MR, mod dil LA, mildly dil RA, mild to mod TR. PASP 35-30mmHg; d. 01/2019 Echo: Ef 55-60%, mod dil LA mildly dil RA. Mild TR. Trace MR.   Diabetes mellitus without complication (HCC)    Type II   GERD (gastroesophageal reflux disease)    Glaucoma    Right Eye   Hematoma of abdominal wall    a. 11/2017 Fall-->R flank hematoma w/ anemia req PRBC. Xarelto dc'd at that time.   History of SCC (squamous cell carcinoma) of skin 08/20/2020   left forearm EDC   Hyperlipidemia    Hypertension    Moderate mitral regurgitation    a. see echo from 06/2015 and TEE 07/2015; b. 06/2017 Echo: Mild MR.   PAF (paroxysmal atrial fibrillation) (HCC)    a.  01/2016 Admitted to [facility] with PAF -->amio added (later dc'd 11/2017  2/2 bradycardia).   Right renal mass    a. 11/2017 CT Abd [facility] 3.4cm R upper pole renal mass concerning for renal cell carcinoma-->outpt f/u w/ urology.   Past Surgical History:  Procedure Laterality Date   BREAST BIOPSY Right 01/14/2021   [place] bx 10:00 coil path pending   CATARACT EXTRACTION Bilateral    CESAREAN SECTION     ELECTROPHYSIOLOGIC STUDY N/A 07/31/2015   Procedure: CARDIOVERSION;  Surgeon: Almond Lint, MD;  Location: [facility] ORS;  Service: Cardiovascular;  Laterality: N/A;   ELECTROPHYSIOLOGIC STUDY N/A 04/03/2016   Procedure: CARDIOVERSION;  Surgeon: Antonieta Iba, MD;   Location: [facility] ORS;  Service: Cardiovascular;  Laterality: N/A;   INTRAMEDULLARY (IM) NAIL INTERTROCHANTERIC Right 10/29/2022   Procedure: INTRAMEDULLARY (IM) NAIL INTERTROCHANTERIC;  Surgeon: Reinaldo Berber, MD;  Location: [facility] ORS;  Service: Orthopedics;  Laterality: Right;   TEE WITHOUT CARDIOVERSION N/A 07/31/2015   Procedure: TRANSESOPHAGEAL ECHOCARDIOGRAM (TEE);  Surgeon: Almond Lint, MD;  Location: [facility] ORS;  Service: Cardiovascular;  Laterality: N/A;   WISDOM TOOTH EXTRACTION     Wrist Surgery Left     No Known Allergies  Outpatient Encounter Medications as of 11/04/2022  Medication Sig   acetaminophen (TYLENOL) 325 MG tablet Take 1-2 tablets (325-650 mg total) by mouth every 6 (six) hours as needed for mild pain (pain score 1-3 or temp > 100.5).   alendronate (FOSAMAX) 70 MG tablet Take 1 tablet (70 mg total) by mouth once a week. Take with a full glass of water on an empty stomach.   amLODipine (NORVASC) 10 MG tablet Take 1 tablet (10 mg total) by mouth daily.   apixaban (ELIQUIS) 5 MG TABS tablet Take 1 tablet (5 mg total) by mouth 2 (two) times daily.   ascorbic acid (VITAMIN C) 1000 MG tablet Take 1,000 mg by mouth daily.   atorvastatin (LIPITOR) 20 MG tablet Take 1 tablet (20 mg total) by mouth at bedtime. Office visit needed for additional refill   b complex vitamins capsule Take 1 capsule by mouth daily.   docusate sodium (COLACE) 100 MG capsule Take 1 capsule (100 mg total) by mouth 2 (two) times daily as needed for mild constipation.   [START ON 11/05/2022] ferrous sulfate 325 (65 FE) MG tablet Take 1 tablet (325 mg total) by mouth every other day.   furosemide (LASIX) 20 MG tablet Take 1 tablet (20 mg total) by mouth daily.   HYDROcodone-acetaminophen (NORCO/VICODIN) 5-325 MG tablet Take 1 tablet by mouth every 8 (eight) hours as needed for moderate pain (pain score 4-6).   insulin glargine (LANTUS) 100 UNIT/ML injection Inject 12 Units into the skin at bedtime.   letrozole  (FEMARA) 2.5 MG tablet Take 1 tablet (2.5 mg total) by mouth daily.   lisinopril (ZESTRIL) 40 MG tablet Take 1 tablet (40 mg total) by mouth daily.   metoprolol tartrate (LOPRESSOR) 25 MG tablet TAKE (1/2) TABLET TWICE DAILY.   Vitamin D, Ergocalciferol, (DRISDOL) 1.25 MG (50000 UNIT) CAPS capsule Take 50,000 Units by mouth once a week.   Zinc Oxide (TRIPLE PASTE) 12.8 % ointment Apply 1 Application topically. To buttocks every shift.   [DISCONTINUED] Blood Pressure KIT Check blood pressure daily or as needed if tired or fatigued; dx I10   No facility-administered encounter medications on file as of 11/04/2022.    Review of Systems  Constitutional:  Negative for appetite change, chills, fatigue and fever.  HENT:  Negative for congestion, hearing loss, rhinorrhea and sore throat.   Eyes: Negative.  Respiratory:  Negative for cough, shortness of breath and wheezing.   Cardiovascular:  Negative for chest pain, palpitations and leg swelling.  Gastrointestinal:  Negative for abdominal pain, constipation, diarrhea, nausea and vomiting.  Genitourinary:  Negative for dysuria.  Musculoskeletal:  Negative for arthralgias, back pain and myalgias.  Skin:  Negative for color change, rash and wound.  Neurological:  Negative for dizziness, weakness and headaches.  Psychiatric/Behavioral:  Negative for behavioral problems. The patient is not nervous/anxious.        Immunization History  Administered Date(s) Administered   Influenza Inj Mdck Quad Pf 12/30/2018   Influenza,inj,Quad PF,6+ Mos 01/02/2017, 01/01/2018   Influenza-Unspecified 01/29/2016, 12/28/2017   Pneumococcal Conjugate-13 02/11/2015, 01/31/2019   Pneumococcal Polysaccharide-23 02/12/2016   Pertinent  Health Maintenance Due  Topic Date Due   FOOT EXAM  12/17/2017   OPHTHALMOLOGY EXAM  07/13/2018   INFLUENZA VACCINE  10/29/2022   HEMOGLOBIN A1C  05/01/2023   DEXA SCAN  Completed      02/28/2018    2:30 AM 01/29/2021   10:49 AM  05/06/2021    1:39 PM 08/12/2021    3:15 PM 01/15/2022    2:19 PM  Fall Risk  (RETIRED) Patient Fall Risk Level Low fall risk Low fall risk Low fall risk High fall risk High fall risk     Vitals:   11/04/22 1010  BP: 112/66  Pulse: 78  Resp: 18  Temp: (!) 97.5 F (36.4 C)  SpO2: 99%  Weight: 134 lb 9.6 oz (61.1 kg)  Height: 5\' 5"  (1.651 m)   Body mass index is 22.4 kg/m.  Physical Exam Constitutional:      Appearance: Normal appearance.  HENT:     Head: Normocephalic and atraumatic.     Nose: Nose normal.     Mouth/Throat:     Mouth: Mucous membranes are moist.  Eyes:     Conjunctiva/sclera: Conjunctivae normal.  Cardiovascular:     Rate and Rhythm: Normal rate. Rhythm irregular.  Pulmonary:     Effort: Pulmonary effort is normal.     Breath sounds: Normal breath sounds.  Abdominal:     General: Bowel sounds are normal.     Palpations: Abdomen is soft.  Musculoskeletal:        General: Normal range of motion.     Cervical back: Normal range of motion.  Skin:    General: Skin is warm and dry.     Comments: Right hip surgical incision covered with honeycomb dressing, dry, no erythema  Neurological:     General: No focal deficit present.     Mental Status: She is alert and oriented to person, place, and time.  Psychiatric:        Mood and Affect: Mood normal.        Behavior: Behavior normal.        Thought Content: Thought content normal.        Judgment: Judgment normal.        Labs reviewed: Recent Labs    10/28/22 1641 10/30/22 0552 10/31/22 0359  NA 136 136 135  K 3.4* 4.0 4.0  CL 106 106 107  CO2 22 23 23   GLUCOSE 243* 262* 204*  BUN 20 21 29*  CREATININE 0.97 0.79 0.80  CALCIUM 8.8* 8.1* 8.3*  MG  --  2.4  --    Recent Labs    01/01/22 1653 10/28/22 1641  AST 20 21  ALT 12 12  ALKPHOS 64 46  BILITOT 0.8 0.9  PROT 7.0 6.4*  ALBUMIN 3.9 3.8   Recent Labs    01/01/22 1653 10/28/22 1641 10/30/22 0552 10/31/22 0359 11/01/22 0546  11/02/22 0758  WBC 6.8   < > 9.6 9.9 6.5 5.9  NEUTROABS 5.0  --  8.3*  --   --   --   HGB 13.3   < > 8.1* 7.1* 9.1* 9.0*  HCT 39.5   < > 23.0* 20.8* 26.5* 26.5*  MCV 92.7   < > 93.9 95.9 92.0 92.7  PLT 173   < > 126* 143* 143* 175   < > = values in this interval not displayed.   Lab Results  Component Value Date   TSH 2.990 01/05/2019   Lab Results  Component Value Date   HGBA1C 6.9 (H) 10/29/2022   Lab Results  Component Value Date   CHOL 140 01/01/2022   HDL 49 01/01/2022   LDLCALC 72 01/01/2022   TRIG 94 01/01/2022   CHOLHDL 2.9 01/01/2022    Significant Diagnostic Results in last 30 days:  DG HIP UNILAT WITH PELVIS 2-3 VIEWS RIGHT  Result Date: 10/29/2022 CLINICAL DATA:  Elective surgery. EXAM: DG HIP (WITH OR WITHOUT PELVIS) 2-3V RIGHT COMPARISON:  Preoperative radiograph yesterday FINDINGS: Four fluoroscopic spot views of the right hip obtained in the operating room. Intramedullary nail with trans trochanteric and distal locking screw fixation traverse intertrochanteric femur fracture. Fluoroscopy dose 17.71 mGy, fluoroscopy time 1 minutes 38 seconds. IMPRESSION: Intraoperative fluoroscopy during right hip ORIF. Electronically Signed   By: Narda Rutherford M.D.   On: 10/29/2022 18:26   DG C-Arm 1-60 Min-No Report  Result Date: 10/29/2022 Fluoroscopy was utilized by the requesting physician.  No radiographic interpretation.   CT Angio Chest Pulmonary Embolism (PE) W or WO Contrast  Result Date: 10/28/2022 CLINICAL DATA:  Fall EXAM: CT ANGIOGRAPHY CHEST WITH CONTRAST TECHNIQUE: Multidetector CT imaging of the chest was performed using the standard protocol during bolus administration of intravenous contrast. Multiplanar CT image reconstructions and MIPs were obtained to evaluate the vascular anatomy. RADIATION DOSE REDUCTION: This exam was performed according to the departmental dose-optimization program which includes automated exposure control, adjustment of the mA and/or kV  according to patient size and/or use of iterative reconstruction technique. CONTRAST:  75mL OMNIPAQUE IOHEXOL 350 MG/ML SOLN COMPARISON:  Chest x-ray 04/29/2022, chest CT 12/06/2017 FINDINGS: Cardiovascular: Satisfactory opacification of the pulmonary arteries to the segmental level. No evidence of pulmonary embolism. Moderate aortic atherosclerosis. No aneurysm. Coronary vascular calcification. Borderline cardiac enlargement. No pericardial effusion Mediastinum/Nodes: Midline trachea. No thyroid mass. No suspicious lymph nodes. Small moderate hiatal hernia Lungs/Pleura: Progression of reticular disease at the apices consistent with fibrosis. Diffuse bilateral mostly subpleural reticular change slightly progressed. No acute airspace disease, pleural effusion or pneumothorax Upper Abdomen: No acute finding Musculoskeletal: No acute or suspicious osseous abnormality. Review of the MIP images confirms the above findings. IMPRESSION: 1. Negative for acute pulmonary embolus or aortic dissection. 2. Progression of fibrotic changes in the lungs. 3. Aortic atherosclerosis. Aortic Atherosclerosis (ICD10-I70.0). Electronically Signed   By: Jasmine Pang M.D.   On: 10/28/2022 21:52   DG FEMUR, MIN 2 VIEWS RIGHT  Result Date: 10/28/2022 CLINICAL DATA:  Fall with right hip fracture EXAM: RIGHT FEMUR 2 VIEWS COMPARISON:  10/28/2022 FINDINGS: Only the mid to distal femur is included. There is no fracture or malalignment. Moderate severe tricompartment arthritis of the knee, worst involving the medial joint space. Vascular calcifications IMPRESSION: No acute osseous abnormality. Electronically Signed   By:  Jasmine Pang M.D.   On: 10/28/2022 21:32   CT Head Wo Contrast  Result Date: 10/28/2022 CLINICAL DATA:  Head trauma, intracranial venous injury suspected; Facial trauma, blunt EXAM: CT HEAD WITHOUT CONTRAST CT CERVICAL SPINE WITHOUT CONTRAST TECHNIQUE: Multidetector CT imaging of the head and cervical spine was performed  following the standard protocol without intravenous contrast. Multiplanar CT image reconstructions of the cervical spine were also generated. RADIATION DOSE REDUCTION: This exam was performed according to the departmental dose-optimization program which includes automated exposure control, adjustment of the mA and/or kV according to patient size and/or use of iterative reconstruction technique. COMPARISON:  CT Head and C Spine 12/06/17 FINDINGS: CT HEAD FINDINGS Brain: No evidence of acute infarction, hemorrhage, hydrocephalus, extra-axial collection or mass lesion/mass effect. Sequela of moderate chronic microvascular ischemic change. 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 Vascular: No hyperdense vessel or unexpected calcification. Skull: Normal. Negative for fracture or focal lesion. Sinuses/Orbits: No middle ear or mastoid effusion. Paranasal sinuses are clear. Bilateral lens replacement. Orbits are otherwise unremarkable. Other: None. CT CERVICAL SPINE FINDINGS Alignment: Grade 1 anterolisthesis of C3 on C4 and C7 on T1. Skull base and vertebrae: No acute fracture. No primary bone lesion or focal pathologic process. Soft tissues and spinal canal: No prevertebral fluid or swelling. No visible canal hematoma. Disc levels:  No evidence of high-grade spinal canal stenosis. Upper chest: Bilateral subpleural reticulations can be seen in the setting of chronic interstitial lung disease. Other: Calcified atherosclerotic plaque of the carotid bifurcations bilaterally. IMPRESSION: 1. No CT evidence of intracranial injury. 2. No acute fracture or traumatic malalignment of the cervical spine. Electronically Signed   By: Lorenza Cambridge M.D.   On: 10/28/2022 17:41   CT Cervical Spine Wo Contrast  Result Date: 10/28/2022 CLINICAL DATA:  Head trauma, intracranial venous injury suspected; Facial trauma, blunt EXAM: CT HEAD WITHOUT CONTRAST CT CERVICAL SPINE WITHOUT CONTRAST TECHNIQUE: Multidetector CT imaging of the  head and cervical spine was performed following the standard protocol without intravenous contrast. Multiplanar CT image reconstructions of the cervical spine were also generated. RADIATION DOSE REDUCTION: This exam was performed according to the departmental dose-optimization program which includes automated exposure control, adjustment of the mA and/or kV according to patient size and/or use of iterative reconstruction technique. COMPARISON:  CT Head and C Spine 12/06/17 FINDINGS: CT HEAD FINDINGS Brain: No evidence of acute infarction, hemorrhage, hydrocephalus, extra-axial collection or mass lesion/mass effect. Sequela of moderate chronic microvascular ischemic change. 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 Vascular: No hyperdense vessel or unexpected calcification. Skull: Normal. Negative for fracture or focal lesion. Sinuses/Orbits: No middle ear or mastoid effusion. Paranasal sinuses are clear. Bilateral lens replacement. Orbits are otherwise unremarkable. Other: None. CT CERVICAL SPINE FINDINGS Alignment: Grade 1 anterolisthesis of C3 on C4 and C7 on T1. Skull base and vertebrae: No acute fracture. No primary bone lesion or focal pathologic process. Soft tissues and spinal canal: No prevertebral fluid or swelling. No visible canal hematoma. Disc levels:  No evidence of high-grade spinal canal stenosis. Upper chest: Bilateral subpleural reticulations can be seen in the setting of chronic interstitial lung disease. Other: Calcified atherosclerotic plaque of the carotid bifurcations bilaterally. IMPRESSION: 1. No CT evidence of intracranial injury. 2. No acute fracture or traumatic malalignment of the cervical spine. Electronically Signed   By: Lorenza Cambridge M.D.   On: 10/28/2022 17:41   DG Chest 1 View  Result Date: 10/28/2022 CLINICAL DATA:  Right hip deformity.  Trauma EXAM: CHEST  1 VIEW COMPARISON:  X-ray 12/06/2017 FINDINGS: Calcified aorta. Film is rotated to the left. No consolidation,  pneumothorax or effusion. No edema. Slight thickening along the minor fissure. Questionable nodule left midlung overlying the posterior aspect of the left seventh rib. Degenerative changes of the spine. IMPRESSION: Small nodular density along the left midthorax. This could be summation of shadow. Recommend standard two-view x-ray to further delineate or CT when appropriate Electronically Signed   By: Karen Kays M.D.   On: 10/28/2022 17:35   DG Hip Unilat W or Wo Pelvis 2-3 Views Right  Result Date: 10/28/2022 CLINICAL DATA:  Pain after fall EXAM: DG HIP (WITH OR WITHOUT PELVIS) 3V RIGHT COMPARISON:  None Available. FINDINGS: Comminuted foreshortened and angulated intertrochanteric right hip fracture. No additional fracture or dislocation. Preserved joint spaces. Osteopenia. Note is made of prominent colonic stool. IMPRESSION: Comminuted foreshortened and angulated intertrochanteric right hip fracture. Electronically Signed   By: Karen Kays M.D.   On: 10/28/2022 17:33    Assessment/Plan  1. Closed displaced intertrochanteric fracture of right femur, sequela -  S/P intramedullary nail on 10/29/22 -  follow up with orthopedics in 2 weeks -   continue Norco PRN for pain -   for PT and OT for therapeutic strengthening exercises -  fall precautions  2. Acute blood loss as cause of postoperative anemia Lab Results  Component Value Date   HGB 9.0 (L) 11/02/2022   -  S/P transfusion of 1 unit PRBC -   continue FeSO4  3. Permanent atrial fibrillation (HCC) -  rate-controlled -  continue Metoprolol tartrate for rate control and Eliquis for anticoagulationte  4. Type 2 diabetes mellitus with diabetic neuropathy, with long-term current use of insulin (HCC) Lab Results  Component Value Date   HGBA1C 6.9 (H) 10/29/2022   -   continue Glargine Lantus  5. Age related osteoporosis, unspecified pathological fracture presence -   continue Fosamax  6. Other hyperlipidemia Lab Results  Component  Value Date   CHOL 140 01/01/2022   HDL 49 01/01/2022   LDLCALC 72 01/01/2022   TRIG 94 01/01/2022   CHOLHDL 2.9 01/01/2022   -  continue Atorvastatin  7. 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  Labs/tests ordered:  None    Kenard Gower, DNP, MSN, FNP-BC [facility] [phone] (Monday-Friday 8:00 a.m. - 5:00 p.m.) [phone] (after hours)

## 2022-11-05 ENCOUNTER — Non-Acute Institutional Stay (SKILLED_NURSING_FACILITY): Payer: Medicare Other | Admitting: Nurse Practitioner

## 2022-11-05 ENCOUNTER — Encounter: Payer: Self-pay | Admitting: Nurse Practitioner

## 2022-11-05 DIAGNOSIS — E1140 Type 2 diabetes mellitus with diabetic neuropathy, unspecified: Secondary | ICD-10-CM

## 2022-11-05 DIAGNOSIS — Z794 Long term (current) use of insulin: Secondary | ICD-10-CM | POA: Diagnosis not present

## 2022-11-05 NOTE — Progress Notes (Signed)
Location:  Other [place].  Nursing Home Room Number: [facility] [address] Place of Service:  SNF [phone]) Abbey Chatters, NP  PCP: Armando Gang, FNP  Patient Care Team: Armando Gang, FNP as PCP - General (Family Medicine) Iran Ouch, MD as PCP - Cardiology (Cardiology) Iran Ouch, MD as Consulting Physician (Cardiology) Scarlett Presto, RN (Inactive) as Oncology Nurse Navigator  Extended Emergency Contact Information Primary Emergency Contact: Paul,[name] Address: [address]          [place], [place] [address] [place] of [place] Home Phone: [phone] Work Phone: [phone] Mobile Phone: [phone] Relation: Daughter  Goals of care: Advanced Directive information    11/05/2022   10:55 AM  Advanced Directives  Does Patient Have a Medical Advance Directive? Yes  Type of Estate agent of [place];Living will  Does patient want to make changes to medical advance directive? No - Patient declined  Copy of Healthcare Power of Attorney in Chart? Yes - validated most recent copy scanned in chart (See row information)     Chief Complaint  Patient presents with   Acute Visit    Increase Blood Sugar.     HPI:  Pt is a 89 y.o. female seen today for an acute visit for for evaluation of elevated blood sugars. Pt at [facility] after fall which resulted in left hip fracture s/p IM nail on 8/1. Reports pain is controlled and working with PT while here.  In the hospital blood sugars ranging 160-200s on lantus 12 units which she takes at home Currently blood sugars ranging 200-340 Fasting 233, 205.  She has had a liberalized diet per family request due to not eating otherwise.  She has been drinking sweet tea and orange juice which she normally does not have at home.      Past Medical History:  Diagnosis Date   Arthritis    In hands   Atrial flutter (HCC)    a. s/p successful TEE/DCCV 07/31/2015; b. 01/2016 Amio added for Afib; c.  11/2017 Amio d/c'd 2/2 bradycardia; d. 12/2018 Back in aflutter-->Zio: Avg HR 74 (41-176) 100% Afl->bb added back; e. CHADS2VASc => 6 (CHF, HTN, age x 2, DM, female)-->eliquis 2.5 bid.   Bradycardia    a. 11/2017 noted during hospitalization @ [facility]-->amio d/c'd by cardiology.   Cataract    Chronic combined systolic (congestive) and diastolic (congestive) heart failure (HCC)    a. 06/2015 Echo: EF 40%, basal HK, nl WM of mid and apical segments, mild to mod MR/TR; b. TEE 07/31/2015: nl LV systolic function, mild to mod MR, trivial TR; c. 06/2017 Echo: EF 55-60%, mild LVH, Gr2 DD, triv AI, mild MR, mod dil LA, mildly dil RA, mild to mod TR. PASP 35-97mmHg; d. 01/2019 Echo: Ef 55-60%, mod dil LA mildly dil RA. Mild TR. Trace MR.   Diabetes mellitus without complication (HCC)    Type II   GERD (gastroesophageal reflux disease)    Glaucoma    Right Eye   Hematoma of abdominal wall    a. 11/2017 Fall-->R flank hematoma w/ anemia req PRBC. Xarelto dc'd at that time.   History of SCC (squamous cell carcinoma) of skin 08/20/2020   left forearm EDC   Hyperlipidemia    Hypertension    Moderate mitral regurgitation    a. see echo from 06/2015 and TEE 07/2015; b. 06/2017 Echo: Mild MR.   PAF (paroxysmal atrial fibrillation) (HCC)    a.  01/2016 Admitted to  [facility] with PAF -->amio added (later dc'd 11/2017 2/2 bradycardia).   Right renal mass    a. 11/2017 CT Abd [facility]): 3.4cm R upper pole renal mass concerning for renal cell carcinoma-->outpt f/u w/ urology.   Past Surgical History:  Procedure Laterality Date   BREAST BIOPSY Right 01/14/2021   [place] bx 10:00 coil path pending   CATARACT EXTRACTION Bilateral    CESAREAN SECTION     ELECTROPHYSIOLOGIC STUDY N/A 07/31/2015   Procedure: CARDIOVERSION;  Surgeon: Almond Lint, MD;  Location: [facility] ORS;  Service: Cardiovascular;  Laterality: N/A;   ELECTROPHYSIOLOGIC STUDY N/A 04/03/2016   Procedure: CARDIOVERSION;  Surgeon: Antonieta Iba, MD;  Location: [facility] ORS;   Service: Cardiovascular;  Laterality: N/A;   INTRAMEDULLARY (IM) NAIL INTERTROCHANTERIC Right 10/29/2022   Procedure: INTRAMEDULLARY (IM) NAIL INTERTROCHANTERIC;  Surgeon: Reinaldo Berber, MD;  Location: [facility] ORS;  Service: Orthopedics;  Laterality: Right;   TEE WITHOUT CARDIOVERSION N/A 07/31/2015   Procedure: TRANSESOPHAGEAL ECHOCARDIOGRAM (TEE);  Surgeon: Almond Lint, MD;  Location: [facility] ORS;  Service: Cardiovascular;  Laterality: N/A;   WISDOM TOOTH EXTRACTION     Wrist Surgery Left     No Known Allergies  Outpatient Encounter Medications as of 11/05/2022  Medication Sig   acetaminophen (TYLENOL) 325 MG tablet Take 1-2 tablets (325-650 mg total) by mouth every 6 (six) hours as needed for mild pain (pain score 1-3 or temp > 100.5).   alendronate (FOSAMAX) 70 MG tablet Take 1 tablet (70 mg total) by mouth once a week. Take with a full glass of water on an empty stomach.   amLODipine (NORVASC) 10 MG tablet Take 1 tablet (10 mg total) by mouth daily.   apixaban (ELIQUIS) 5 MG TABS tablet Take 1 tablet (5 mg total) by mouth 2 (two) times daily.   ascorbic acid (VITAMIN C) 1000 MG tablet Take 1,000 mg by mouth daily.   atorvastatin (LIPITOR) 20 MG tablet Take 1 tablet (20 mg total) by mouth at bedtime. Office visit needed for additional refill   b complex vitamins capsule Take 1 capsule by mouth daily.   docusate sodium (COLACE) 100 MG capsule Take 1 capsule (100 mg total) by mouth 2 (two) times daily as needed for mild constipation.   ferrous sulfate 325 (65 FE) MG tablet Take 1 tablet (325 mg total) by mouth every other day.   furosemide (LASIX) 20 MG tablet Take 1 tablet (20 mg total) by mouth daily.   HYDROcodone-acetaminophen (NORCO/VICODIN) 5-325 MG tablet Take 1 tablet by mouth every 8 (eight) hours as needed for moderate pain (pain score 4-6).   insulin glargine (LANTUS) 100 UNIT/ML injection Inject 12 Units into the skin at bedtime.   letrozole (FEMARA) 2.5 MG tablet Take 1 tablet (2.5  mg total) by mouth daily.   lisinopril (ZESTRIL) 40 MG tablet Take 1 tablet (40 mg total) by mouth daily.   metoprolol tartrate (LOPRESSOR) 25 MG tablet TAKE (1/2) TABLET TWICE DAILY.   Vitamin D, Ergocalciferol, (DRISDOL) 1.25 MG (50000 UNIT) CAPS capsule Take 50,000 Units by mouth once a week.   Zinc Oxide (TRIPLE PASTE) 12.8 % ointment Apply 1 Application topically. To buttocks every shift.   No facility-administered encounter medications on file as of 11/05/2022.    Review of Systems  Constitutional:  Negative for activity change, appetite change, fatigue and unexpected weight change.  HENT:  Negative for congestion and hearing loss.   Eyes: Negative.   Respiratory:  Negative for cough and shortness of breath.   Cardiovascular:  Negative for chest pain, palpitations and leg swelling.  Gastrointestinal:  Negative for abdominal pain, constipation and diarrhea.  Genitourinary:  Negative for difficulty urinating and dysuria.  Musculoskeletal:  Negative for arthralgias and myalgias.  Skin:  Negative for color change and wound.  Neurological:  Negative for dizziness and weakness.  Psychiatric/Behavioral:  Negative for agitation, behavioral problems and confusion.     Immunization History  Administered Date(s) Administered   Influenza Inj Mdck Quad Pf 12/30/2018   Influenza,inj,Quad PF,6+ Mos 01/02/2017, 01/01/2018   Influenza-Unspecified 01/29/2016, 12/28/2017   Pneumococcal Conjugate-13 02/11/2015, 01/31/2019   Pneumococcal Polysaccharide-23 02/12/2016   RSV,unspecified 11/04/2022   Pertinent  Health Maintenance Due  Topic Date Due   FOOT EXAM  12/17/2017   OPHTHALMOLOGY EXAM  07/13/2018   INFLUENZA VACCINE  10/29/2022   HEMOGLOBIN A1C  05/01/2023   DEXA SCAN  Completed      02/28/2018    2:30 AM 01/29/2021   10:49 AM 05/06/2021    1:39 PM 08/12/2021    3:15 PM 01/15/2022    2:19 PM  Fall Risk  (RETIRED) Patient Fall Risk Level Low fall risk Low fall risk Low fall risk High  fall risk High fall risk   Functional Status Survey:    Vitals:   11/05/22 1050  BP: 115/69  Pulse: 88  Resp: 18  Temp: 97.8 F (36.6 C)  SpO2: 96%  Weight: 134 lb 9.6 oz (61.1 kg)  Height: 5\' 5"  (1.651 m)   Body mass index is 22.4 kg/m. Physical Exam Constitutional:      Appearance: Normal appearance.  Cardiovascular:     Rate and Rhythm: Normal rate and regular rhythm.  Pulmonary:     Effort: Pulmonary effort is normal.  Neurological:     Mental Status: She is alert. Mental status is at baseline.  Psychiatric:        Mood and Affect: Mood normal.     Labs reviewed: Recent Labs    10/28/22 1641 10/30/22 0552 10/31/22 0359  NA 136 136 135  K 3.4* 4.0 4.0  CL 106 106 107  CO2 22 23 23   GLUCOSE 243* 262* 204*  BUN 20 21 29*  CREATININE 0.97 0.79 0.80  CALCIUM 8.8* 8.1* 8.3*  MG  --  2.4  --    Recent Labs    01/01/22 1653 10/28/22 1641  AST 20 21  ALT 12 12  ALKPHOS 64 46  BILITOT 0.8 0.9  PROT 7.0 6.4*  ALBUMIN 3.9 3.8   Recent Labs    01/01/22 1653 10/28/22 1641 10/30/22 0552 10/31/22 0359 11/01/22 0546 11/02/22 0758  WBC 6.8   < > 9.6 9.9 6.5 5.9  NEUTROABS 5.0  --  8.3*  --   --   --   HGB 13.3   < > 8.1* 7.1* 9.1* 9.0*  HCT 39.5   < > 23.0* 20.8* 26.5* 26.5*  MCV 92.7   < > 93.9 95.9 92.0 92.7  PLT 173   < > 126* 143* 143* 175   < > = values in this interval not displayed.   Lab Results  Component Value Date   TSH 2.990 01/05/2019   Lab Results  Component Value Date   HGBA1C 6.9 (H) 10/29/2022   Lab Results  Component Value Date   CHOL 140 01/01/2022   HDL 49 01/01/2022   LDLCALC 72 01/01/2022   TRIG 94 01/01/2022   CHOLHDL 2.9 01/01/2022    Significant Diagnostic Results in last 30 days:  DG  HIP UNILAT WITH PELVIS 2-3 VIEWS RIGHT  Result Date: 10/29/2022 CLINICAL DATA:  Elective surgery. EXAM: DG HIP (WITH OR WITHOUT PELVIS) 2-3V RIGHT COMPARISON:  Preoperative radiograph yesterday FINDINGS: Four fluoroscopic spot views  of the right hip obtained in the operating room. Intramedullary nail with trans trochanteric and distal locking screw fixation traverse intertrochanteric femur fracture. Fluoroscopy dose 17.71 mGy, fluoroscopy time 1 minutes 38 seconds. IMPRESSION: Intraoperative fluoroscopy during right hip ORIF. Electronically Signed   By: Narda Rutherford M.D.   On: 10/29/2022 18:26   DG C-Arm 1-60 Min-No Report  Result Date: 10/29/2022 Fluoroscopy was utilized by the requesting physician.  No radiographic interpretation.   CT Angio Chest Pulmonary Embolism (PE) W or WO Contrast  Result Date: 10/28/2022 CLINICAL DATA:  Fall EXAM: CT ANGIOGRAPHY CHEST WITH CONTRAST TECHNIQUE: Multidetector CT imaging of the chest was performed using the standard protocol during bolus administration of intravenous contrast. Multiplanar CT image reconstructions and MIPs were obtained to evaluate the vascular anatomy. RADIATION DOSE REDUCTION: This exam was performed according to the departmental dose-optimization program which includes automated exposure control, adjustment of the mA and/or kV according to patient size and/or use of iterative reconstruction technique. CONTRAST:  75mL OMNIPAQUE IOHEXOL 350 MG/ML SOLN COMPARISON:  Chest x-ray 04/29/2022, chest CT 12/06/2017 FINDINGS: Cardiovascular: Satisfactory opacification of the pulmonary arteries to the segmental level. No evidence of pulmonary embolism. Moderate aortic atherosclerosis. No aneurysm. Coronary vascular calcification. Borderline cardiac enlargement. No pericardial effusion Mediastinum/Nodes: Midline trachea. No thyroid mass. No suspicious lymph nodes. Small moderate hiatal hernia Lungs/Pleura: Progression of reticular disease at the apices consistent with fibrosis. Diffuse bilateral mostly subpleural reticular change slightly progressed. No acute airspace disease, pleural effusion or pneumothorax Upper Abdomen: No acute finding Musculoskeletal: No acute or suspicious osseous  abnormality. Review of the MIP images confirms the above findings. IMPRESSION: 1. Negative for acute pulmonary embolus or aortic dissection. 2. Progression of fibrotic changes in the lungs. 3. Aortic atherosclerosis. Aortic Atherosclerosis (ICD10-I70.0). Electronically Signed   By: Jasmine Pang M.D.   On: 10/28/2022 21:52   DG FEMUR, MIN 2 VIEWS RIGHT  Result Date: 10/28/2022 CLINICAL DATA:  Fall with right hip fracture EXAM: RIGHT FEMUR 2 VIEWS COMPARISON:  10/28/2022 FINDINGS: Only the mid to distal femur is included. There is no fracture or malalignment. Moderate severe tricompartment arthritis of the knee, worst involving the medial joint space. Vascular calcifications IMPRESSION: No acute osseous abnormality. Electronically Signed   By: Jasmine Pang M.D.   On: 10/28/2022 21:32   CT Head Wo Contrast  Result Date: 10/28/2022 CLINICAL DATA:  Head trauma, intracranial venous injury suspected; Facial trauma, blunt EXAM: CT HEAD WITHOUT CONTRAST CT CERVICAL SPINE WITHOUT CONTRAST TECHNIQUE: Multidetector CT imaging of the head and cervical spine was performed following the standard protocol without intravenous contrast. Multiplanar CT image reconstructions of the cervical spine were also generated. RADIATION DOSE REDUCTION: This exam was performed according to the departmental dose-optimization program which includes automated exposure control, adjustment of the mA and/or kV according to patient size and/or use of iterative reconstruction technique. COMPARISON:  CT Head and C Spine 12/06/17 FINDINGS: CT HEAD FINDINGS Brain: No evidence of acute infarction, hemorrhage, hydrocephalus, extra-axial collection or mass lesion/mass effect. Sequela of moderate chronic microvascular ischemic change. 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 Vascular: No hyperdense vessel or unexpected calcification. Skull: Normal. Negative for fracture or focal lesion. Sinuses/Orbits: No middle ear or mastoid effusion. Paranasal  sinuses are clear. Bilateral lens replacement. Orbits are otherwise unremarkable. Other:  None. CT CERVICAL SPINE FINDINGS Alignment: Grade 1 anterolisthesis of C3 on C4 and C7 on T1. Skull base and vertebrae: No acute fracture. No primary bone lesion or focal pathologic process. Soft tissues and spinal canal: No prevertebral fluid or swelling. No visible canal hematoma. Disc levels:  No evidence of high-grade spinal canal stenosis. Upper chest: Bilateral subpleural reticulations can be seen in the setting of chronic interstitial lung disease. Other: Calcified atherosclerotic plaque of the carotid bifurcations bilaterally. IMPRESSION: 1. No CT evidence of intracranial injury. 2. No acute fracture or traumatic malalignment of the cervical spine. Electronically Signed   By: Lorenza Cambridge M.D.   On: 10/28/2022 17:41   CT Cervical Spine Wo Contrast  Result Date: 10/28/2022 CLINICAL DATA:  Head trauma, intracranial venous injury suspected; Facial trauma, blunt EXAM: CT HEAD WITHOUT CONTRAST CT CERVICAL SPINE WITHOUT CONTRAST TECHNIQUE: Multidetector CT imaging of the head and cervical spine was performed following the standard protocol without intravenous contrast. Multiplanar CT image reconstructions of the cervical spine were also generated. RADIATION DOSE REDUCTION: This exam was performed according to the departmental dose-optimization program which includes automated exposure control, adjustment of the mA and/or kV according to patient size and/or use of iterative reconstruction technique. COMPARISON:  CT Head and C Spine 12/06/17 FINDINGS: CT HEAD FINDINGS Brain: No evidence of acute infarction, hemorrhage, hydrocephalus, extra-axial collection or mass lesion/mass effect. Sequela of moderate chronic microvascular ischemic change. 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 Vascular: No hyperdense vessel or unexpected calcification. Skull: Normal. Negative for fracture or focal lesion. Sinuses/Orbits: No middle  ear or mastoid effusion. Paranasal sinuses are clear. Bilateral lens replacement. Orbits are otherwise unremarkable. Other: None. CT CERVICAL SPINE FINDINGS Alignment: Grade 1 anterolisthesis of C3 on C4 and C7 on T1. Skull base and vertebrae: No acute fracture. No primary bone lesion or focal pathologic process. Soft tissues and spinal canal: No prevertebral fluid or swelling. No visible canal hematoma. Disc levels:  No evidence of high-grade spinal canal stenosis. Upper chest: Bilateral subpleural reticulations can be seen in the setting of chronic interstitial lung disease. Other: Calcified atherosclerotic plaque of the carotid bifurcations bilaterally. IMPRESSION: 1. No CT evidence of intracranial injury. 2. No acute fracture or traumatic malalignment of the cervical spine. Electronically Signed   By: Lorenza Cambridge M.D.   On: 10/28/2022 17:41   DG Chest 1 View  Result Date: 10/28/2022 CLINICAL DATA:  Right hip deformity.  Trauma EXAM: CHEST  1 VIEW COMPARISON:  X-ray 12/06/2017 FINDINGS: Calcified aorta. Film is rotated to the left. No consolidation, pneumothorax or effusion. No edema. Slight thickening along the minor fissure. Questionable nodule left midlung overlying the posterior aspect of the left seventh rib. Degenerative changes of the spine. IMPRESSION: Small nodular density along the left midthorax. This could be summation of shadow. Recommend standard two-view x-ray to further delineate or CT when appropriate Electronically Signed   By: Karen Kays M.D.   On: 10/28/2022 17:35   DG Hip Unilat W or Wo Pelvis 2-3 Views Right  Result Date: 10/28/2022 CLINICAL DATA:  Pain after fall EXAM: DG HIP (WITH OR WITHOUT PELVIS) 3V RIGHT COMPARISON:  None Available. FINDINGS: Comminuted foreshortened and angulated intertrochanteric right hip fracture. No additional fracture or dislocation. Preserved joint spaces. Osteopenia. Note is made of prominent colonic stool. IMPRESSION: Comminuted foreshortened and  angulated intertrochanteric right hip fracture. Electronically Signed   By: Karen Kays M.D.   On: 10/28/2022 17:33    Assessment/Plan 1. Type 2 diabetes mellitus with  diabetic neuropathy, with long-term current use of insulin (HCC) Discussed dietary modifications and decreasing sugar in drinks. She is agreeable to this and staff will remind her. Will continue lantus at current dose and adjust medications if blood sugars remain elevated.   Janene Harvey. [facility] [phone]

## 2022-11-09 ENCOUNTER — Non-Acute Institutional Stay (SKILLED_NURSING_FACILITY): Payer: Medicare Other | Admitting: Student

## 2022-11-09 ENCOUNTER — Encounter: Payer: Self-pay | Admitting: Student

## 2022-11-09 DIAGNOSIS — I272 Pulmonary hypertension, unspecified: Secondary | ICD-10-CM

## 2022-11-09 DIAGNOSIS — E7849 Other hyperlipidemia: Secondary | ICD-10-CM

## 2022-11-09 DIAGNOSIS — I10 Essential (primary) hypertension: Secondary | ICD-10-CM

## 2022-11-09 DIAGNOSIS — I429 Cardiomyopathy, unspecified: Secondary | ICD-10-CM

## 2022-11-09 DIAGNOSIS — Z66 Do not resuscitate: Secondary | ICD-10-CM

## 2022-11-09 DIAGNOSIS — I209 Angina pectoris, unspecified: Secondary | ICD-10-CM | POA: Diagnosis not present

## 2022-11-09 DIAGNOSIS — S72141S Displaced intertrochanteric fracture of right femur, sequela: Secondary | ICD-10-CM | POA: Diagnosis not present

## 2022-11-09 DIAGNOSIS — C50411 Malignant neoplasm of upper-outer quadrant of right female breast: Secondary | ICD-10-CM

## 2022-11-09 DIAGNOSIS — D62 Acute posthemorrhagic anemia: Secondary | ICD-10-CM

## 2022-11-09 DIAGNOSIS — Z794 Long term (current) use of insulin: Secondary | ICD-10-CM

## 2022-11-09 DIAGNOSIS — M810 Age-related osteoporosis without current pathological fracture: Secondary | ICD-10-CM

## 2022-11-09 DIAGNOSIS — I4821 Permanent atrial fibrillation: Secondary | ICD-10-CM

## 2022-11-09 DIAGNOSIS — E1140 Type 2 diabetes mellitus with diabetic neuropathy, unspecified: Secondary | ICD-10-CM

## 2022-11-09 NOTE — Progress Notes (Unsigned)
Provider:  Earnestine Mealing, MD Location:   [facility]ing Home Room Number: 105 [id number] Place of Service:  SNF [phone])  PCP: Armando Gang, FNP Patient Care Team: Armando Gang, FNP as PCP - General (Family Medicine) Iran Ouch, MD as PCP - Cardiology (Cardiology) Iran Ouch, MD as Consulting Physician (Cardiology) Scarlett Presto, RN (Inactive) as Oncology Nurse Navigator  Extended Emergency Contact Information Primary Emergency Contact: Paul,[name] Address: [address]          [place], [place] [address] [place] Home Phone: [phone] Work Phone: [phone] Mobile Phone: [phone] Relation: Daughter  Code Status: FULL CODE Goals of Care: Advanced Directive information    11/09/2022   11:26 AM  Advanced Directives  Does Patient Have a Medical Advance Directive? Yes  Type of Estate agent of [place];Living will  Does patient want to make changes to medical advance directive? No - Patient declined  Copy of Healthcare Power of Attorney in Chart? Yes - validated most recent copy scanned in chart (See row information)      Chief Complaint  Patient presents with  . New Admit To SNF    New admission  . Immunizations    Discuss need for COVID vaccine, Tdap, shingrix and Flu vaccine.   . Quality Metric Gaps    Foot exam,eye exam and Medicare annual wellnes due    HPI: Patient is a 89 y.o. female seen today for admission to [facility] after hospitalization for a fall. She is status post IM nail of the on the right.  She was at home and went from kitchen to living room fell on the floor and broke her hip. She never had a fall before. It wasn't shoes or anythign on the floor. The leg gave way. She wasn't confused postoperatively.   Her pain is wlel-controlled until she has to go for a walk, but it improves pretty quickly. She had some therapy that made it really hurt badly.   She lived alone at  home alone. Her husband passed away 4-5 years ago. She has a daughter in [place] and 2 step daughters [place] and [place] ([name] helps a lot). She cooked. She went to the drugs store and put her medications in a pill box. She did drive 3-4 years ago. One of the girls will get her groceries for her. [name] also manages bills and finances.   [place] was from [place] originally and her father ran a dairy farm. 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 She worked at the [employer] as well and worked there for 7 years.   She hopes to move home after therapy but she will probably have to have someone help with things at home after discharge.   She started fosamax in 2019, will need 5 years of treatment.  Past Medical History:  Diagnosis Date  . Arthritis    In hands  . Atrial flutter (HCC)    a. s/p successful TEE/DCCV 07/31/2015; b. 01/2016 Amio added for Afib; c. 11/2017 Amio d/c'd 2/2 bradycardia; d. 12/2018 Back in aflutter-->Zio: Avg HR 74 (41-176) 100% Afl->bb added back; e. CHADS2VASc => 6 (CHF, HTN, age x 2, DM, female)-->eliquis 2.5 bid.  . Bradycardia    a. 11/2017 noted during hospitalization @ [facility]-->amio d/c'd by cardiology.  . Cataract   . Chronic combined systolic (congestive) and diastolic (congestive) heart failure (HCC)  a. 06/2015 Echo: EF 40%, basal HK, nl WM of mid and apical segments, mild to mod MR/TR; b. TEE 07/31/2015: nl LV systolic function, mild to mod MR, trivial TR; c. 06/2017 Echo: EF 55-60%, mild LVH, Gr2 DD, triv AI, mild MR, mod dil LA, mildly dil RA, mild to mod TR. PASP 35-22mmHg; d. 01/2019 Echo: Ef 55-60%, mod dil LA mildly dil RA. Mild TR. Trace MR.  . Diabetes mellitus without complication (HCC)    Type II  . GERD (gastroesophageal reflux disease)   . Glaucoma    Right Eye  . Hematoma of abdominal wall    a. 11/2017 Fall-->R flank hematoma w/ anemia req PRBC. Xarelto dc'd at that time.  Marland Kitchen History of SCC (squamous cell carcinoma) of  skin 08/20/2020   left forearm EDC  . Hyperlipidemia   . Hypertension   . Moderate mitral regurgitation    a. see echo from 06/2015 and TEE 07/2015; b. 06/2017 Echo: Mild MR.  Marland Kitchen PAF (paroxysmal atrial fibrillation) (HCC)    a.  01/2016 Admitted to [facility] with PAF -->amio added (later dc'd 11/2017 2/2 bradycardia).  . Right renal mass    a. 11/2017 CT [facility]): 3.4cm R upper pole renal mass concerning for renal cell carcinoma-->outpt f/u w/ urology.   Past Surgical History:  Procedure Laterality Date  . BREAST BIOPSY Right 01/14/2021   [place] bx 10:00 coil path pending  . CATARACT EXTRACTION Bilateral   . CESAREAN SECTION    . ELECTROPHYSIOLOGIC STUDY N/A 07/31/2015   Procedure: CARDIOVERSION;  Surgeon: Almond Lint, MD;  Location: [facility] ORS;  Service: Cardiovascular;  Laterality: N/A;  . ELECTROPHYSIOLOGIC STUDY N/A 04/03/2016   Procedure: CARDIOVERSION;  Surgeon: Antonieta Iba, MD;  Location: [facility] ORS;  Service: Cardiovascular;  Laterality: N/A;  . INTRAMEDULLARY (IM) NAIL INTERTROCHANTERIC Right 10/29/2022   Procedure: INTRAMEDULLARY (IM) NAIL INTERTROCHANTERIC;  Surgeon: Reinaldo Berber, MD;  Location: [facility] ORS;  Service: Orthopedics;  Laterality: Right;  . TEE WITHOUT CARDIOVERSION N/A 07/31/2015   Procedure: TRANSESOPHAGEAL ECHOCARDIOGRAM (TEE);  Surgeon: Almond Lint, MD;  Location: [facility] ORS;  Service: Cardiovascular;  Laterality: N/A;  . WISDOM TOOTH EXTRACTION    . Wrist Surgery Left     reports that she has never smoked. She has never used smokeless tobacco. She reports that she does not drink alcohol and does not use drugs. Social History   Socioeconomic History  . Marital status: Widowed    Spouse name: Not on file  . Number of children: 1  . Years of education: Not on file  . Highest education level: 12th grade  Occupational History  . Occupation: Retired  Tobacco Use  . Smoking status: Never  . Smokeless tobacco: Never  . Tobacco comments:    smoking cessation materials  not required  Vaping Use  . Vaping status: Never Used  Substance and Sexual Activity  . Alcohol use: No  . Drug use: No  . Sexual activity: Not Currently  Other Topics Concern  . Not on file  Social History Narrative  . Not on file   Social Determinants of Health   Financial Resource Strain: Low Risk  (11/02/2017)   Overall Financial Resource Strain (CARDIA)   . Difficulty of Paying Living Expenses: Not hard at all  Food Insecurity: No Food Insecurity (10/29/2022)   Hunger Vital Sign   . Worried About Programme researcher, broadcasting/film/video in the Last Year: Never true   . Ran Out of Food in the Last Year: Never true  Transportation Needs: No Transportation Needs (10/28/2022)   PRAPARE - Transportation   . Lack of Transportation (Medical): No   . Lack of Transportation (Non-Medical): No  Physical Activity: Inactive (11/02/2017)   Exercise Vital Sign   . Days of Exercise per Week: 0 days   . Minutes of Exercise per Session: 0 min  Stress: Stress Concern Present (11/02/2017)   [employer] of Occupational Health - Occupational Stress Questionnaire   . Feeling of Stress : To some extent  Social Connections: Unknown (11/02/2017)   Social Connection and Isolation Panel [NHANES]   . Frequency of Communication with Friends and Family: Patient declined   . Frequency of Social Gatherings with Friends and Family: Patient declined   . Attends Religious Services: Patient declined   . Active Member of Clubs or Organizations: Patient declined   . Attends Banker Meetings: Patient declined   . Marital Status: Widowed  Intimate Partner Violence: Not At Risk (10/29/2022)   Humiliation, Afraid, Rape, and Kick questionnaire   . Fear of Current or Ex-Partner: No   . Emotionally Abused: No   . Physically Abused: No   . Sexually Abused: No    Functional Status Survey:    Family History  Problem Relation Age of Onset  . Cataracts Mother   . Heart attack Mother   . Heart attack Father   . Aneurysm  Daughter   . Heart attack Maternal Grandfather   . Heart attack Paternal Grandfather   . Heart attack Brother   . Breast cancer Neg Hx     Health Maintenance  Topic Date Due  . COVID-19 Vaccine (1) Never done  . DTaP/Tdap/Td (1 - Tdap) Never done  . Zoster Vaccines- Shingrix (1 of 2) Never done  . FOOT EXAM  12/17/2017  . OPHTHALMOLOGY EXAM  07/13/2018  . Medicare Annual Wellness (AWV)  04/07/2019  . INFLUENZA VACCINE  10/29/2022  . HEMOGLOBIN A1C  05/01/2023  . Pneumonia Vaccine 29+ Years old  Completed  . DEXA SCAN  Completed  . HPV VACCINES  Aged Out    No Known Allergies  Allergies as of 11/09/2022   No Known Allergies      Medication List        Accurate as of November 09, 2022 11:31 AM. If you have any questions, ask your nurse or doctor.          acetaminophen 325 MG tablet Commonly known as: TYLENOL Take 650 mg by mouth every 6 (six) hours as needed. What changed: Another medication with the same name was removed. Continue taking this medication, and follow the directions you see here. Changed by: Earnestine Mealing   alendronate 70 MG tablet Commonly known as: FOSAMAX Take 1 tablet (70 mg total) by mouth once a week. Take with a full glass of water on an empty stomach.   amLODipine 10 MG tablet Commonly known as: NORVASC Take 1 tablet (10 mg total) by mouth daily.   apixaban 5 MG Tabs tablet Commonly known as: Eliquis Take 1 tablet (5 mg total) by mouth 2 (two) times daily.   ascorbic acid 1000 MG tablet Commonly known as: VITAMIN C Take 1,000 mg by mouth daily.   atorvastatin 20 MG tablet Commonly known as: LIPITOR Take 1 tablet (20 mg total) by mouth at bedtime. Office visit needed for additional refill   b complex vitamins capsule Take 1 capsule by mouth daily.   docusate sodium 100 MG capsule Commonly known as: COLACE Take 1 capsule (  100 mg total) by mouth 2 (two) times daily as needed for mild constipation.   ferrous sulfate 325 (65 FE)  MG tablet Take 1 tablet (325 mg total) by mouth every other day.   furosemide 20 MG tablet Commonly known as: LASIX Take 1 tablet (20 mg total) by mouth daily.   HYDROcodone-acetaminophen 5-325 MG tablet Commonly known as: NORCO/VICODIN Take 1 tablet by mouth every 8 (eight) hours as needed for moderate pain (pain score 4-6).   insulin glargine 100 UNIT/ML injection Commonly known as: LANTUS Inject 12 Units into the skin at bedtime.   letrozole 2.5 MG tablet Commonly known as: FEMARA Take 1 tablet (2.5 mg total) by mouth daily.   lisinopril 40 MG tablet Commonly known as: ZESTRIL Take 1 tablet (40 mg total) by mouth daily.   metoprolol tartrate 25 MG tablet Commonly known as: LOPRESSOR TAKE (1/2) TABLET TWICE DAILY.   Vitamin D (Ergocalciferol) 1.25 MG (50000 UNIT) Caps capsule Commonly known as: DRISDOL Take 50,000 Units by mouth once a week.   Zinc Oxide 12.8 % ointment Commonly known as: TRIPLE PASTE Apply 1 Application topically. To buttocks every shift.        Review of Systems  Vitals:   11/09/22 1125  BP: (!) 148/88  Pulse: 84  Resp: 17  Temp: (!) 97.2 F (36.2 C)  SpO2: 95%  Weight: 134 lb 9.6 oz (61.1 kg)  Height: 5\' 5"  (1.651 m)   Body mass index is 22.4 kg/m. Physical Exam  Labs reviewed: Basic Metabolic Panel: Recent Labs    10/28/22 1641 10/30/22 0552 10/31/22 0359  NA 136 136 135  K 3.4* 4.0 4.0  CL 106 106 107  CO2 22 23 23   GLUCOSE 243* 262* 204*  BUN 20 21 29*  CREATININE 0.97 0.79 0.80  CALCIUM 8.8* 8.1* 8.3*  MG  --  2.4  --    Liver Function Tests: Recent Labs    01/01/22 1653 10/28/22 1641  AST 20 21  ALT 12 12  ALKPHOS 64 46  BILITOT 0.8 0.9  PROT 7.0 6.4*  ALBUMIN 3.9 3.8   No results for input(s): "LIPASE", "AMYLASE" in the last 8760 hours. No results for input(s): "AMMONIA" in the last 8760 hours. CBC: Recent Labs    01/01/22 1653 10/28/22 1641 10/30/22 0552 10/31/22 0359 11/01/22 0546  11/02/22 0758  WBC 6.8   < > 9.6 9.9 6.5 5.9  NEUTROABS 5.0  --  8.3*  --   --   --   HGB 13.3   < > 8.1* 7.1* 9.1* 9.0*  HCT 39.5   < > 23.0* 20.8* 26.5* 26.5*  MCV 92.7   < > 93.9 95.9 92.0 92.7  PLT 173   < > 126* 143* 143* 175   < > = values in this interval not displayed.   Cardiac Enzymes: No results for input(s): "CKTOTAL", "CKMB", "CKMBINDEX", "TROPONINI" in the last 8760 hours. BNP: Invalid input(s): "POCBNP" Lab Results  Component Value Date   HGBA1C 6.9 (H) 10/29/2022   Lab Results  Component Value Date   TSH 2.990 01/05/2019   No results found for: "VITAMINB12" No results found for: "FOLATE" No results found for: "IRON", "TIBC", "FERRITIN"  Imaging and Procedures obtained prior to SNF admission: DG HIP UNILAT WITH PELVIS 2-3 VIEWS RIGHT  Result Date: 10/29/2022 CLINICAL DATA:  Elective surgery. EXAM: DG HIP (WITH OR WITHOUT PELVIS) 2-3V RIGHT COMPARISON:  Preoperative radiograph yesterday FINDINGS: Four fluoroscopic spot views of the right hip obtained  in the operating room. Intramedullary nail with trans trochanteric and distal locking screw fixation traverse intertrochanteric femur fracture. Fluoroscopy dose 17.71 mGy, fluoroscopy time 1 minutes 38 seconds. IMPRESSION: Intraoperative fluoroscopy during right hip ORIF. Electronically Signed   By: Narda Rutherford M.D.   On: 10/29/2022 18:26   DG C-Arm 1-60 Min-No Report  Result Date: 10/29/2022 Fluoroscopy was utilized by the requesting physician.  No radiographic interpretation.   CT Angio Chest Pulmonary Embolism (PE) W or WO Contrast  Result Date: 10/28/2022 CLINICAL DATA:  Fall EXAM: CT ANGIOGRAPHY CHEST WITH CONTRAST TECHNIQUE: Multidetector CT imaging of the chest was performed using the standard protocol during bolus administration of intravenous contrast. Multiplanar CT image reconstructions and MIPs were obtained to evaluate the vascular anatomy. RADIATION DOSE REDUCTION: This exam was performed according to  the departmental dose-optimization program which includes automated exposure control, adjustment of the mA and/or kV according to patient size and/or use of iterative reconstruction technique. CONTRAST:  75mL OMNIPAQUE IOHEXOL 350 MG/ML SOLN COMPARISON:  Chest x-ray 04/29/2022, chest CT 12/06/2017 FINDINGS: Cardiovascular: Satisfactory opacification of the pulmonary arteries to the segmental level. No evidence of pulmonary embolism. Moderate aortic atherosclerosis. No aneurysm. Coronary vascular calcification. Borderline cardiac enlargement. No pericardial effusion Mediastinum/Nodes: Midline trachea. No thyroid mass. No suspicious lymph nodes. Small moderate hiatal hernia Lungs/Pleura: Progression of reticular disease at the apices consistent with fibrosis. Diffuse bilateral mostly subpleural reticular change slightly progressed. No acute airspace disease, pleural effusion or pneumothorax Upper Abdomen: No acute finding Musculoskeletal: No acute or suspicious osseous abnormality. Review of the MIP images confirms the above findings. IMPRESSION: 1. Negative for acute pulmonary embolus or aortic dissection. 2. Progression of fibrotic changes in the lungs. 3. Aortic atherosclerosis. Aortic Atherosclerosis (ICD10-I70.0). Electronically Signed   By: Jasmine Pang M.D.   On: 10/28/2022 21:52   DG FEMUR, MIN 2 VIEWS RIGHT  Result Date: 10/28/2022 CLINICAL DATA:  Fall with right hip fracture EXAM: RIGHT FEMUR 2 VIEWS COMPARISON:  10/28/2022 FINDINGS: Only the mid to distal femur is included. There is no fracture or malalignment. Moderate severe tricompartment arthritis of the knee, worst involving the medial joint space. Vascular calcifications IMPRESSION: No acute osseous abnormality. Electronically Signed   By: Jasmine Pang M.D.   On: 10/28/2022 21:32   CT Head Wo Contrast  Result Date: 10/28/2022 CLINICAL DATA:  Head trauma, intracranial venous injury suspected; Facial trauma, blunt EXAM: CT HEAD WITHOUT  CONTRAST CT CERVICAL SPINE WITHOUT CONTRAST TECHNIQUE: Multidetector CT imaging of the head and cervical spine was performed following the standard protocol without intravenous contrast. Multiplanar CT image reconstructions of the cervical spine were also generated. RADIATION DOSE REDUCTION: This exam was performed according to the departmental dose-optimization program which includes automated exposure control, adjustment of the mA and/or kV according to patient size and/or use of iterative reconstruction technique. COMPARISON:  CT Head and C Spine 12/06/17 FINDINGS: CT HEAD FINDINGS Brain: No evidence of acute infarction, hemorrhage, hydrocephalus, extra-axial collection or mass lesion/mass effect. Sequela of moderate chronic microvascular ischemic change. 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 Vascular: No hyperdense vessel or unexpected calcification. Skull: Normal. Negative for fracture or focal lesion. Sinuses/Orbits: No middle ear or mastoid effusion. Paranasal sinuses are clear. Bilateral lens replacement. Orbits are otherwise unremarkable. Other: None. CT CERVICAL SPINE FINDINGS Alignment: Grade 1 anterolisthesis of C3 on C4 and C7 on T1. Skull base and vertebrae: No acute fracture. No primary bone lesion or focal pathologic process. Soft tissues and spinal canal: No prevertebral fluid or  swelling. No visible canal hematoma. Disc levels:  No evidence of high-grade spinal canal stenosis. Upper chest: Bilateral subpleural reticulations can be seen in the setting of chronic interstitial lung disease. Other: Calcified atherosclerotic plaque of the carotid bifurcations bilaterally. IMPRESSION: 1. No CT evidence of intracranial injury. 2. No acute fracture or traumatic malalignment of the cervical spine. Electronically Signed   By: Lorenza Cambridge M.D.   On: 10/28/2022 17:41   CT Cervical Spine Wo Contrast  Result Date: 10/28/2022 CLINICAL DATA:  Head trauma, intracranial venous injury suspected; Facial  trauma, blunt EXAM: CT HEAD WITHOUT CONTRAST CT CERVICAL SPINE WITHOUT CONTRAST TECHNIQUE: Multidetector CT imaging of the head and cervical spine was performed following the standard protocol without intravenous contrast. Multiplanar CT image reconstructions of the cervical spine were also generated. RADIATION DOSE REDUCTION: This exam was performed according to the departmental dose-optimization program which includes automated exposure control, adjustment of the mA and/or kV according to patient size and/or use of iterative reconstruction technique. COMPARISON:  CT Head and C Spine 12/06/17 FINDINGS: CT HEAD FINDINGS Brain: No evidence of acute infarction, hemorrhage, hydrocephalus, extra-axial collection or mass lesion/mass effect. Sequela of moderate chronic microvascular ischemic change. 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 Vascular: No hyperdense vessel or unexpected calcification. Skull: Normal. Negative for fracture or focal lesion. Sinuses/Orbits: No middle ear or mastoid effusion. Paranasal sinuses are clear. Bilateral lens replacement. Orbits are otherwise unremarkable. Other: None. CT CERVICAL SPINE FINDINGS Alignment: Grade 1 anterolisthesis of C3 on C4 and C7 on T1. Skull base and vertebrae: No acute fracture. No primary bone lesion or focal pathologic process. Soft tissues and spinal canal: No prevertebral fluid or swelling. No visible canal hematoma. Disc levels:  No evidence of high-grade spinal canal stenosis. Upper chest: Bilateral subpleural reticulations can be seen in the setting of chronic interstitial lung disease. Other: Calcified atherosclerotic plaque of the carotid bifurcations bilaterally. IMPRESSION: 1. No CT evidence of intracranial injury. 2. No acute fracture or traumatic malalignment of the cervical spine. Electronically Signed   By: Lorenza Cambridge M.D.   On: 10/28/2022 17:41   DG Chest 1 View  Result Date: 10/28/2022 CLINICAL DATA:  Right hip deformity.  Trauma EXAM:  CHEST  1 VIEW COMPARISON:  X-ray 12/06/2017 FINDINGS: Calcified aorta. Film is rotated to the left. No consolidation, pneumothorax or effusion. No edema. Slight thickening along the minor fissure. Questionable nodule left midlung overlying the posterior aspect of the left seventh rib. Degenerative changes of the spine. IMPRESSION: Small nodular density along the left midthorax. This could be summation of shadow. Recommend standard two-view x-ray to further delineate or CT when appropriate Electronically Signed   By: Karen Kays M.D.   On: 10/28/2022 17:35   DG Hip Unilat W or Wo Pelvis 2-3 Views Right  Result Date: 10/28/2022 CLINICAL DATA:  Pain after fall EXAM: DG HIP (WITH OR WITHOUT PELVIS) 3V RIGHT COMPARISON:  None Available. FINDINGS: Comminuted foreshortened and angulated intertrochanteric right hip fracture. No additional fracture or dislocation. Preserved joint spaces. Osteopenia. Note is made of prominent colonic stool. IMPRESSION: Comminuted foreshortened and angulated intertrochanteric right hip fracture. Electronically Signed   By: Karen Kays M.D.   On: 10/28/2022 17:33    Assessment/Plan There are no diagnoses linked to this encounter.   Family/ staff Communication:   Labs/tests ordered:

## 2022-11-10 ENCOUNTER — Encounter: Payer: Self-pay | Admitting: Student

## 2022-11-10 DIAGNOSIS — I272 Pulmonary hypertension, unspecified: Secondary | ICD-10-CM | POA: Insufficient documentation

## 2022-11-10 DIAGNOSIS — I429 Cardiomyopathy, unspecified: Secondary | ICD-10-CM | POA: Insufficient documentation

## 2022-11-19 ENCOUNTER — Non-Acute Institutional Stay (SKILLED_NURSING_FACILITY): Payer: Medicare Other | Admitting: Nurse Practitioner

## 2022-11-19 ENCOUNTER — Encounter: Payer: Self-pay | Admitting: Nurse Practitioner

## 2022-11-19 DIAGNOSIS — I4821 Permanent atrial fibrillation: Secondary | ICD-10-CM

## 2022-11-19 DIAGNOSIS — C50411 Malignant neoplasm of upper-outer quadrant of right female breast: Secondary | ICD-10-CM

## 2022-11-19 DIAGNOSIS — E7849 Other hyperlipidemia: Secondary | ICD-10-CM | POA: Diagnosis not present

## 2022-11-19 DIAGNOSIS — I272 Pulmonary hypertension, unspecified: Secondary | ICD-10-CM

## 2022-11-19 DIAGNOSIS — E1140 Type 2 diabetes mellitus with diabetic neuropathy, unspecified: Secondary | ICD-10-CM | POA: Diagnosis not present

## 2022-11-19 DIAGNOSIS — Z794 Long term (current) use of insulin: Secondary | ICD-10-CM

## 2022-11-19 DIAGNOSIS — I484 Atypical atrial flutter: Secondary | ICD-10-CM

## 2022-11-19 DIAGNOSIS — S72141S Displaced intertrochanteric fracture of right femur, sequela: Secondary | ICD-10-CM | POA: Diagnosis not present

## 2022-11-19 DIAGNOSIS — I10 Essential (primary) hypertension: Secondary | ICD-10-CM

## 2022-11-19 NOTE — Progress Notes (Unsigned)
Location:  Other [place].  Nursing Home Room Number: [facility] [address] Place of Service:  SNF [phone]) Abbey Chatters, NP  PCP: Armando Gang, FNP  Patient Care Team: Armando Gang, FNP as PCP - General (Family Medicine) Iran Ouch, MD as PCP - Cardiology (Cardiology) Iran Ouch, MD as Consulting Physician (Cardiology) Scarlett Presto, RN (Inactive) as Oncology Nurse Navigator  Extended Emergency Contact Information Primary Emergency Contact: Paul,[name] Address: [address]          [place], [place] [address] D[place] of [place] Home Phone: [phone] Work Phone: [phone] Mobile Phone: [phone] Relation: Daughter  Goals of care: Advanced Directive information    11/19/2022    8:53 AM  Advanced Directives  Does Patient Have a Medical Advance Directive? Yes  Type of Estate agent of [place];Living will;Out of facility DNR (pink MOST or yellow form)  Does patient want to make changes to medical advance directive? No - Patient declined  Copy of Healthcare Power of Attorney in Chart? Yes - validated most recent copy scanned in chart (See row information)     Chief Complaint  Patient presents with   Discharge Note    Discharge.     HPI:  Pt is a 89 y.o. female seen today for Discharge.    Past Medical History:  Diagnosis Date   Arthritis    In hands   Atrial flutter (HCC)    a. s/p successful TEE/DCCV 07/31/2015; b. 01/2016 Amio added for Afib; c. 11/2017 Amio d/c'd 2/2 bradycardia; d. 12/2018 Back in aflutter-->Zio: Avg HR 74 (41-176) 100% Afl->bb added back; e. CHADS2VASc => 6 (CHF, HTN, age x 2, DM, female)-->eliquis 2.5 bid.   Bradycardia    a. 11/2017 noted during hospitalization @ [facility]-->amio d/c'd by cardiology.   Cataract    Chronic combined systolic (congestive) and diastolic (congestive) heart failure (HCC)    a. 06/2015 Echo: EF 40%, basal HK, nl WM of mid and apical segments, mild to mod MR/TR; b. TEE  07/31/2015: nl LV systolic function, mild to mod MR, trivial TR; c. 06/2017 Echo: EF 55-60%, mild LVH, Gr2 DD, triv AI, mild MR, mod dil LA, mildly dil RA, mild to mod TR. PASP 35-55mmHg; d. 01/2019 Echo: Ef 55-60%, mod dil LA mildly dil RA. Mild TR. Trace MR.   Diabetes mellitus without complication (HCC)    Type II   GERD (gastroesophageal reflux disease)    Glaucoma    Right Eye   Hematoma of abdominal wall    a. 11/2017 Fall-->R flank hematoma w/ anemia req PRBC. Xarelto dc'd at that time.   History of SCC (squamous cell carcinoma) of skin 08/20/2020   left forearm EDC   Hyperlipidemia    Hypertension    Moderate mitral regurgitation    a. see echo from 06/2015 and TEE 07/2015; b. 06/2017 Echo: Mild MR.   PAF (paroxysmal atrial fibrillation) (HCC)    a.  01/2016 Admitted to [facility] with PAF -->amio added (later dc'd 11/2017 2/2 bradycardia).   Right renal mass    a. 11/2017 CT Abd [facility]): 3.4cm R upper pole renal mass concerning for renal cell carcinoma-->outpt f/u w/ urology.   Past Surgical History:  Procedure Laterality Date   BREAST BIOPSY Right 01/14/2021   [place] bx 10:00 coil path pending   CATARACT EXTRACTION Bilateral    CESAREAN SECTION     ELECTROPHYSIOLOGIC STUDY N/A 07/31/2015   Procedure: CARDIOVERSION;  Surgeon: Almond Lint, MD;  Location:  [facility] ORS;  Service: Cardiovascular;  Laterality: N/A;   ELECTROPHYSIOLOGIC STUDY N/A 04/03/2016   Procedure: CARDIOVERSION;  Surgeon: Antonieta Iba, MD;  Location: [facility] ORS;  Service: Cardiovascular;  Laterality: N/A;   INTRAMEDULLARY (IM) NAIL INTERTROCHANTERIC Right 10/29/2022   Procedure: INTRAMEDULLARY (IM) NAIL INTERTROCHANTERIC;  Surgeon: Reinaldo Berber, MD;  Location: [facility] ORS;  Service: Orthopedics;  Laterality: Right;   TEE WITHOUT CARDIOVERSION N/A 07/31/2015   Procedure: TRANSESOPHAGEAL ECHOCARDIOGRAM (TEE);  Surgeon: Almond Lint, MD;  Location: [facility] ORS;  Service: Cardiovascular;  Laterality: N/A;   WISDOM TOOTH EXTRACTION      Wrist Surgery Left     No Known Allergies  Outpatient Encounter Medications as of 11/19/2022  Medication Sig   acetaminophen (TYLENOL) 325 MG tablet Take 650 mg by mouth every 6 (six) hours as needed.   alendronate (FOSAMAX) 70 MG tablet Take 1 tablet (70 mg total) by mouth once a week. Take with a full glass of water on an empty stomach.   amLODipine (NORVASC) 10 MG tablet Take 1 tablet (10 mg total) by mouth daily.   apixaban (ELIQUIS) 5 MG TABS tablet Take 1 tablet (5 mg total) by mouth 2 (two) times daily.   ascorbic acid (VITAMIN C) 1000 MG tablet Take 1,000 mg by mouth daily.   atorvastatin (LIPITOR) 20 MG tablet Take 20 mg by mouth daily.   b complex vitamins capsule Take 1 capsule by mouth daily.   ferrous sulfate 325 (65 FE) MG tablet Take 1 tablet (325 mg total) by mouth every other day.   furosemide (LASIX) 20 MG tablet Take 1 tablet (20 mg total) by mouth daily.   HYDROcodone-acetaminophen (NORCO/VICODIN) 5-325 MG tablet Take 1 tablet by mouth every 8 (eight) hours as needed for moderate pain (pain score 4-6).   insulin glargine (LANTUS) 100 UNIT/ML injection Inject 12 Units into the skin at bedtime.   letrozole (FEMARA) 2.5 MG tablet Take 1 tablet (2.5 mg total) by mouth daily.   lisinopril (ZESTRIL) 40 MG tablet Take 1 tablet (40 mg total) by mouth daily.   metoprolol tartrate (LOPRESSOR) 25 MG tablet TAKE (1/2) TABLET TWICE DAILY.   polyethylene glycol (MIRALAX / GLYCOLAX) 17 g packet Take 17 g by mouth daily.   senna (SENOKOT) 8.6 MG TABS tablet Take 2 tablets by mouth at bedtime.   Vitamin D, Ergocalciferol, (DRISDOL) 1.25 MG (50000 UNIT) CAPS capsule Take 50,000 Units by mouth once a week.   Zinc Oxide (TRIPLE PASTE) 12.8 % ointment Apply 1 Application topically. To buttocks every shift.   [DISCONTINUED] atorvastatin (LIPITOR) 20 MG tablet Take 1 tablet (20 mg total) by mouth at bedtime. Office visit needed for additional refill (Patient taking differently: Take 20 mg by  mouth at bedtime.)   [DISCONTINUED] docusate sodium (COLACE) 100 MG capsule Take 1 capsule (100 mg total) by mouth 2 (two) times daily as needed for mild constipation.   No facility-administered encounter medications on file as of 11/19/2022.    Review of Systems***  Immunization History  Administered Date(s) Administered   Influenza Inj Mdck Quad Pf 12/30/2018   Influenza,inj,Quad PF,6+ Mos 01/02/2017, 01/01/2018   Influenza-Unspecified 01/29/2016, 12/28/2017   Pneumococcal Conjugate-13 02/11/2015, 01/31/2019   Pneumococcal Polysaccharide-23 02/12/2016   RSV,unspecified 11/04/2022   Pertinent  Health Maintenance Due  Topic Date Due   FOOT EXAM  12/17/2017   OPHTHALMOLOGY EXAM  07/13/2018   INFLUENZA VACCINE  10/29/2022   HEMOGLOBIN A1C  05/01/2023   DEXA SCAN  Completed  02/28/2018    2:30 AM 01/29/2021   10:49 AM 05/06/2021    1:39 PM 08/12/2021    3:15 PM 01/15/2022    2:19 PM  Fall Risk  (RETIRED) Patient Fall Risk Level Low fall risk Low fall risk Low fall risk High fall risk High fall risk   Functional Status Survey:    Vitals:   11/19/22 0810  BP: (!) 145/66  Pulse: 70  Resp: 18  Temp: (!) 97.5 F (36.4 C)  SpO2: 95%  Weight: 131 lb (59.4 kg)  Height: 5\' 5"  (1.651 m)   Body mass index is 21.8 kg/m. Physical Exam***  Labs reviewed: Recent Labs    10/28/22 1641 10/30/22 0552 10/31/22 0359  NA 136 136 135  K 3.4* 4.0 4.0  CL 106 106 107  CO2 22 23 23   GLUCOSE 243* 262* 204*  BUN 20 21 29*  CREATININE 0.97 0.79 0.80  CALCIUM 8.8* 8.1* 8.3*  MG  --  2.4  --    Recent Labs    01/01/22 1653 10/28/22 1641  AST 20 21  ALT 12 12  ALKPHOS 64 46  BILITOT 0.8 0.9  PROT 7.0 6.4*  ALBUMIN 3.9 3.8   Recent Labs    01/01/22 1653 10/28/22 1641 10/30/22 0552 10/31/22 0359 11/01/22 0546 11/02/22 0758  WBC 6.8   < > 9.6 9.9 6.5 5.9  NEUTROABS 5.0  --  8.3*  --   --   --   HGB 13.3   < > 8.1* 7.1* 9.1* 9.0*  HCT 39.5   < > 23.0* 20.8* 26.5*  26.5*  MCV 92.7   < > 93.9 95.9 92.0 92.7  PLT 173   < > 126* 143* 143* 175   < > = values in this interval not displayed.   Lab Results  Component Value Date   TSH 2.990 01/05/2019   Lab Results  Component Value Date   HGBA1C 6.9 (H) 10/29/2022   Lab Results  Component Value Date   CHOL 140 01/01/2022   HDL 49 01/01/2022   LDLCALC 72 01/01/2022   TRIG 94 01/01/2022   CHOLHDL 2.9 01/01/2022    Significant Diagnostic Results in last 30 days:  DG HIP UNILAT WITH PELVIS 2-3 VIEWS RIGHT  Result Date: 10/29/2022 CLINICAL DATA:  Elective surgery. EXAM: DG HIP (WITH OR WITHOUT PELVIS) 2-3V RIGHT COMPARISON:  Preoperative radiograph yesterday FINDINGS: Four fluoroscopic spot views of the right hip obtained in the operating room. Intramedullary nail with trans trochanteric and distal locking screw fixation traverse intertrochanteric femur fracture. Fluoroscopy dose 17.71 mGy, fluoroscopy time 1 minutes 38 seconds. IMPRESSION: Intraoperative fluoroscopy during right hip ORIF. Electronically Signed   By: Narda Rutherford M.D.   On: 10/29/2022 18:26   DG C-Arm 1-60 Min-No Report  Result Date: 10/29/2022 Fluoroscopy was utilized by the requesting physician.  No radiographic interpretation.   CT Angio Chest Pulmonary Embolism (PE) W or WO Contrast  Result Date: 10/28/2022 CLINICAL DATA:  Fall EXAM: CT ANGIOGRAPHY CHEST WITH CONTRAST TECHNIQUE: Multidetector CT imaging of the chest was performed using the standard protocol during bolus administration of intravenous contrast. Multiplanar CT image reconstructions and MIPs were obtained to evaluate the vascular anatomy. RADIATION DOSE REDUCTION: This exam was performed according to the departmental dose-optimization program which includes automated exposure control, adjustment of the mA and/or kV according to patient size and/or use of iterative reconstruction technique. CONTRAST:  75mL OMNIPAQUE IOHEXOL 350 MG/ML SOLN COMPARISON:  Chest x-ray  04/29/2022, chest CT 12/06/2017  FINDINGS: Cardiovascular: Satisfactory opacification of the pulmonary arteries to the segmental level. No evidence of pulmonary embolism. Moderate aortic atherosclerosis. No aneurysm. Coronary vascular calcification. Borderline cardiac enlargement. No pericardial effusion Mediastinum/Nodes: Midline trachea. No thyroid mass. No suspicious lymph nodes. Small moderate hiatal hernia Lungs/Pleura: Progression of reticular disease at the apices consistent with fibrosis. Diffuse bilateral mostly subpleural reticular change slightly progressed. No acute airspace disease, pleural effusion or pneumothorax Upper Abdomen: No acute finding Musculoskeletal: No acute or suspicious osseous abnormality. Review of the MIP images confirms the above findings. IMPRESSION: 1. Negative for acute pulmonary embolus or aortic dissection. 2. Progression of fibrotic changes in the lungs. 3. Aortic atherosclerosis. Aortic Atherosclerosis (ICD10-I70.0). Electronically Signed   By: Jasmine Pang M.D.   On: 10/28/2022 21:52   DG FEMUR, MIN 2 VIEWS RIGHT  Result Date: 10/28/2022 CLINICAL DATA:  Fall with right hip fracture EXAM: RIGHT FEMUR 2 VIEWS COMPARISON:  10/28/2022 FINDINGS: Only the mid to distal femur is included. There is no fracture or malalignment. Moderate severe tricompartment arthritis of the knee, worst involving the medial joint space. Vascular calcifications IMPRESSION: No acute osseous abnormality. Electronically Signed   By: Jasmine Pang M.D.   On: 10/28/2022 21:32   CT Head Wo Contrast  Result Date: 10/28/2022 CLINICAL DATA:  Head trauma, intracranial venous injury suspected; Facial trauma, blunt EXAM: CT HEAD WITHOUT CONTRAST CT CERVICAL SPINE WITHOUT CONTRAST TECHNIQUE: Multidetector CT imaging of the head and cervical spine was performed following the standard protocol without intravenous contrast. Multiplanar CT image reconstructions of the cervical spine were also generated.  RADIATION DOSE REDUCTION: This exam was performed according to the departmental dose-optimization program which includes automated exposure control, adjustment of the mA and/or kV according to patient size and/or use of iterative reconstruction technique. COMPARISON:  CT Head and C Spine 12/06/17 FINDINGS: CT HEAD FINDINGS Brain: No evidence of acute infarction, hemorrhage, hydrocephalus, extra-axial collection or mass lesion/mass effect. Sequela of moderate chronic microvascular ischemic change. 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 Vascular: No hyperdense vessel or unexpected calcification. Skull: Normal. Negative for fracture or focal lesion. Sinuses/Orbits: No middle ear or mastoid effusion. Paranasal sinuses are clear. Bilateral lens replacement. Orbits are otherwise unremarkable. Other: None. CT CERVICAL SPINE FINDINGS Alignment: Grade 1 anterolisthesis of C3 on C4 and C7 on T1. Skull base and vertebrae: No acute fracture. No primary bone lesion or focal pathologic process. Soft tissues and spinal canal: No prevertebral fluid or swelling. No visible canal hematoma. Disc levels:  No evidence of high-grade spinal canal stenosis. Upper chest: Bilateral subpleural reticulations can be seen in the setting of chronic interstitial lung disease. Other: Calcified atherosclerotic plaque of the carotid bifurcations bilaterally. IMPRESSION: 1. No CT evidence of intracranial injury. 2. No acute fracture or traumatic malalignment of the cervical spine. Electronically Signed   By: Lorenza Cambridge M.D.   On: 10/28/2022 17:41   CT Cervical Spine Wo Contrast  Result Date: 10/28/2022 CLINICAL DATA:  Head trauma, intracranial venous injury suspected; Facial trauma, blunt EXAM: CT HEAD WITHOUT CONTRAST CT CERVICAL SPINE WITHOUT CONTRAST TECHNIQUE: Multidetector CT imaging of the head and cervical spine was performed following the standard protocol without intravenous contrast. Multiplanar CT image reconstructions of the  cervical spine were also generated. RADIATION DOSE REDUCTION: This exam was performed according to the departmental dose-optimization program which includes automated exposure control, adjustment of the mA and/or kV according to patient size and/or use of iterative reconstruction technique. COMPARISON:  CT Head and C Spine 12/06/17 FINDINGS:  CT HEAD FINDINGS Brain: No evidence of acute infarction, hemorrhage, hydrocephalus, extra-axial collection or mass lesion/mass effect. Sequela of moderate chronic microvascular ischemic change. 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 Vascular: No hyperdense vessel or unexpected calcification. Skull: Normal. Negative for fracture or focal lesion. Sinuses/Orbits: No middle ear or mastoid effusion. Paranasal sinuses are clear. Bilateral lens replacement. Orbits are otherwise unremarkable. Other: None. CT CERVICAL SPINE FINDINGS Alignment: Grade 1 anterolisthesis of C3 on C4 and C7 on T1. Skull base and vertebrae: No acute fracture. No primary bone lesion or focal pathologic process. Soft tissues and spinal canal: No prevertebral fluid or swelling. No visible canal hematoma. Disc levels:  No evidence of high-grade spinal canal stenosis. Upper chest: Bilateral subpleural reticulations can be seen in the setting of chronic interstitial lung disease. Other: Calcified atherosclerotic plaque of the carotid bifurcations bilaterally. IMPRESSION: 1. No CT evidence of intracranial injury. 2. No acute fracture or traumatic malalignment of the cervical spine. Electronically Signed   By: Lorenza Cambridge M.D.   On: 10/28/2022 17:41   DG Chest 1 View  Result Date: 10/28/2022 CLINICAL DATA:  Right hip deformity.  Trauma EXAM: CHEST  1 VIEW COMPARISON:  X-ray 12/06/2017 FINDINGS: Calcified aorta. Film is rotated to the left. No consolidation, pneumothorax or effusion. No edema. Slight thickening along the minor fissure. Questionable nodule left midlung overlying the posterior aspect of the  left seventh rib. Degenerative changes of the spine. IMPRESSION: Small nodular density along the left midthorax. This could be summation of shadow. Recommend standard two-view x-ray to further delineate or CT when appropriate Electronically Signed   By: Karen Kays M.D.   On: 10/28/2022 17:35   DG Hip Unilat W or Wo Pelvis 2-3 Views Right  Result Date: 10/28/2022 CLINICAL DATA:  Pain after fall EXAM: DG HIP (WITH OR WITHOUT PELVIS) 3V RIGHT COMPARISON:  None Available. FINDINGS: Comminuted foreshortened and angulated intertrochanteric right hip fracture. No additional fracture or dislocation. Preserved joint spaces. Osteopenia. Note is made of prominent colonic stool. IMPRESSION: Comminuted foreshortened and angulated intertrochanteric right hip fracture. Electronically Signed   By: Karen Kays M.D.   On: 10/28/2022 17:33    Assessment/Plan 1. Atypical atrial flutter (HCC) ***  2. Permanent atrial fibrillation (HCC) ***  3. 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 Biagio Borg [facility] [phone]

## 2023-01-07 ENCOUNTER — Ambulatory Visit: Payer: Medicare Other | Admitting: Cardiovascular Disease

## 2023-01-12 NOTE — Progress Notes (Unsigned)
Cardiology Office Note    Date:  01/13/2023   ID:  Amber Wolfe, DOB 01/17/1932, MRN [id number]  PCP:  Armando Gang, FNP  Cardiologist:  Lorine Bears, MD  Electrophysiologist:  None   Chief Complaint: Follow-up  History of Present Illness:   Amber Wolfe is a 89 y.o. female with history of coronary artery calcification noted on CT imaging, cardiomyopathy, permanent atrial flutter/fib, breast cancer diagnosed in 2022 with declination for surgery, chemotherapy, or radiation, aortic atherosclerosis, DM2, HTN, HLD, and mitral regurgitation who presents for follow-up of coronary artery calcification, cardiomyopathy and A. Fib/flutter.   She was previously followed by Dr. Alvino Chapel.  Nuclear stress test in 05/2015 showed no evidence of ischemia or perfusion defects with a normal EF.  She was diagnosed with atrial flutter in early 2017.  In this setting, she was noted to have a mildly reduced LV systolic function with an EF of 40% by echo in 06/2015.  She subsequently underwent DCCV in 07/2015.  She had recurrent A. fib in 01/2016 and was placed on amiodarone with repeat successful DCCV in 03/2016.  Follow-up echo in 07/2016 showed an improved LV systolic function with an EF of 60 to 65%, moderate LVH, grade 2 diastolic dysfunction, mild to moderate mitral regurgitation, mildly dilated left atrium, mildly to moderately dilated right atrium, and PASP 40 to 45 mmHg.  Her beta-blocker has previously been discontinued and amiodarone reduced to 100 mg daily in the setting of bradycardia.  Echo in 06/2017 showed a normal LV systolic function with mild mitral regurgitation and mild to moderate tricuspid regurgitation.  She was admitted to [facility] in 11/2017 in the setting of recurrent falls and right flank hematoma with significant anemia.  During her admission she required 1 unit of PRBC and Xarelto was placed on hold.  Amiodarone was discontinued in the setting of bradycardia.  Oral anticoagulation ultimately ended  up being held until 08/2018, at which time she was doing well and had not had any recurrent falls leading her to be placed on Eliquis 2.5 mg twice daily with stable hemoglobin.  She was noted to be back in rate controlled atrial flutter in 12/2018.  Out of concern for previous drop in EF, when in atrial flutter, echo was obtained in 01/2019 which showed a normal LV systolic function with an EF of 55 to 60% with trace mitral regurgitation.  Subsequent outpatient cardiac monitoring, to assess heart rates given prior history of bradycardia, showed an average heart rate of 73 bpm with a low of 41 bpm mostly occurring during the overnight hours.  Her highest heart rate was 176 bpm and she remained in A. fib/flutter throughout the monitoring period.  Given this, she was placed on metoprolol 25 mg twice daily.  She was last seen in the office in 12/2021 and was without frank chest pain.  She reported increased fatigue and shortness of breath.  With regards to her breast cancer, she ultimately elected not to pursue surgery and has been maintained on hormone therapy.  She was admitted to [facility] in 09/2022 with a mechanical fall with right intertrochanteric femur fracture status post ORIF.  She did hit her head, though did not suffer LOC.  CT head showed no evidence of intracranial injury.  Admission was also notable for acute post operative blood loss anemia with hemoglobin trending from 12.8 on admission to 7.1 requiring 1 unit PRBC with a discharge Hgb of 9.0.  CTA chest was obtained during the admission which  was negative for PE or acute aortic process.  Progression of fibrotic pulmonary changes along with coronary artery calcification and aortic atherosclerosis were noted.  She comes in company by family today and is doing well from a cardiac perspective.  No symptoms of angina or cardiac decompensation.  No dyspnea.  She has not had any further falls.  No hematochezia, melena, hemoptysis, hematuria, or hematemesis.  She  does notice some positional dizziness if she stands quickly.  No presyncope or syncope.  No lower extremity swelling or progressive orthopnea.  Does note a slightly decreased appetite.  Family feels like the patient is doing well.   Labs independently reviewed: 10/2022 - Hgb 9.0, PLT 175, potassium 4.0, BUN 29, serum creatinine 0.8, magnesium 2.4, A1c 6.9 09/2022 - albumin 3.8, AST/ALT normal 12/2021 - TC 140, TG 94, HDL 49, LDL 72 12/2018 - TSH normal  Past Medical History:  Diagnosis Date   Arthritis    In hands   Atrial flutter (HCC)    a. s/p successful TEE/DCCV 07/31/2015; b. 01/2016 Amio added for Afib; c. 11/2017 Amio d/c'd 2/2 bradycardia; d. 12/2018 Back in aflutter-->Zio: Avg HR 74 (41-176) 100% Afl->bb added back; e. CHADS2VASc => 6 (CHF, HTN, age x 2, DM, female)-->eliquis 2.5 bid.   Bradycardia    a. 11/2017 noted during hospitalization @ [facility]-->amio d/c'd by cardiology.   Cataract    Chronic combined systolic (congestive) and diastolic (congestive) heart failure (HCC)    a. 06/2015 Echo: EF 40%, basal HK, nl WM of mid and apical segments, mild to mod MR/TR; b. TEE 07/31/2015: nl LV systolic function, mild to mod MR, trivial TR; c. 06/2017 Echo: EF 55-60%, mild LVH, Gr2 DD, triv AI, mild MR, mod dil LA, mildly dil RA, mild to mod TR. PASP 35-29mmHg; d. 01/2019 Echo: Ef 55-60%, mod dil LA mildly dil RA. Mild TR. Trace MR.   Diabetes mellitus without complication (HCC)    Type II   GERD (gastroesophageal reflux disease)    Glaucoma    Right Eye   Hematoma of abdominal wall    a. 11/2017 Fall-->R flank hematoma w/ anemia req PRBC. Xarelto dc'd at that time.   History of SCC (squamous cell carcinoma) of skin 08/20/2020   left forearm EDC   Hyperlipidemia    Hypertension    Moderate mitral regurgitation    a. see echo from 06/2015 and TEE 07/2015; b. 06/2017 Echo: Mild MR.   PAF (paroxysmal atrial fibrillation) (HCC)    a.  01/2016 Admitted to [facility] with PAF -->amio added (later dc'd 11/2017  2/2 bradycardia).   Right renal mass    a. 11/2017 CT [facility]): 3.4cm R upper pole renal mass concerning for renal cell carcinoma-->outpt f/u w/ urology.    Past Surgical History:  Procedure Laterality Date   BREAST BIOPSY Right 01/14/2021   [place] bx 10:00 coil path pending   CATARACT EXTRACTION Bilateral    CESAREAN SECTION     ELECTROPHYSIOLOGIC STUDY N/A 07/31/2015   Procedure: CARDIOVERSION;  Surgeon: Almond Lint, MD;  Location: [facility] ORS;  Service: Cardiovascular;  Laterality: N/A;   ELECTROPHYSIOLOGIC STUDY N/A 04/03/2016   Procedure: CARDIOVERSION;  Surgeon: Antonieta Iba, MD;  Location: [facility] ORS;  Service: Cardiovascular;  Laterality: N/A;   INTRAMEDULLARY (IM) NAIL INTERTROCHANTERIC Right 10/29/2022   Procedure: INTRAMEDULLARY (IM) NAIL INTERTROCHANTERIC;  Surgeon: Reinaldo Berber, MD;  Location: [facility] ORS;  Service: Orthopedics;  Laterality: Right;   TEE WITHOUT CARDIOVERSION N/A 07/31/2015   Procedure: TRANSESOPHAGEAL ECHOCARDIOGRAM (TEE);  Surgeon: Almond Lint, MD;  Location: [facility] ORS;  Service: Cardiovascular;  Laterality: N/A;   WISDOM TOOTH EXTRACTION     Wrist Surgery Left     Current Medications: Current Meds  Medication Sig   acetaminophen (TYLENOL) 325 MG tablet Take 650 mg by mouth every 6 (six) hours as needed.   alendronate (FOSAMAX) 70 MG tablet Take 1 tablet (70 mg total) by mouth once a week. Take with a full glass of water on an empty stomach.   amLODipine (NORVASC) 10 MG tablet Take 1 tablet (10 mg total) by mouth daily.   ascorbic acid (VITAMIN C) 1000 MG tablet Take 1,000 mg by mouth daily.   atorvastatin (LIPITOR) 20 MG tablet Take 20 mg by mouth daily.   b complex vitamins capsule Take 1 capsule by mouth daily.   ferrous sulfate 325 (65 FE) MG tablet Take 1 tablet (325 mg total) by mouth every other day.   furosemide (LASIX) 20 MG tablet Take 1 tablet (20 mg total) by mouth daily.   HYDROcodone-acetaminophen (NORCO/VICODIN) 5-325 MG tablet Take 1 tablet  by mouth every 8 (eight) hours as needed for moderate pain (pain score 4-6).   insulin glargine (LANTUS) 100 UNIT/ML injection Inject 12 Units into the skin at bedtime.   letrozole (FEMARA) 2.5 MG tablet Take 1 tablet (2.5 mg total) by mouth daily.   lisinopril (ZESTRIL) 40 MG tablet Take 1 tablet (40 mg total) by mouth daily.   metoprolol tartrate (LOPRESSOR) 25 MG tablet TAKE (1/2) TABLET TWICE DAILY.   polyethylene glycol (MIRALAX / GLYCOLAX) 17 g packet Take 17 g by mouth daily.   senna (SENOKOT) 8.6 MG TABS tablet Take 2 tablets by mouth at bedtime.   Zinc Oxide (TRIPLE PASTE) 12.8 % ointment Apply 1 Application topically. To buttocks every shift.   [DISCONTINUED] apixaban (ELIQUIS) 5 MG TABS tablet Take 1 tablet (5 mg total) by mouth 2 (two) times daily.    Allergies:   Patient has no known allergies.   Social History   Socioeconomic History   Marital status: Widowed    Spouse name: Not on file   Number of children: 1   Years of education: Not on file   Highest education level: 12th grade  Occupational History   Occupation: Retired  Tobacco Use   Smoking status: Never   Smokeless tobacco: Never   Tobacco comments:    smoking cessation materials not required  Vaping Use   Vaping status: Never Used  Substance and Sexual Activity   Alcohol use: No   Drug use: No   Sexual activity: Not Currently  Other Topics Concern   Not on file  Social History Narrative   Not on file   Social Determinants of Health   Financial Resource Strain: Low Risk  (11/02/2017)   Overall Financial Resource Strain (CARDIA)    Difficulty of Paying Living Expenses: Not hard at all  Food Insecurity: No Food Insecurity (10/29/2022)   Hunger Vital Sign    Worried About Running Out of Food in the Last Year: Never true    Ran Out of Food in the Last Year: Never true  Transportation Needs: No Transportation Needs (10/28/2022)   PRAPARE - Administrator, Civil Service (Medical): No    Lack of  Transportation (Non-Medical): No  Physical Activity: Inactive (11/02/2017)   Exercise Vital Sign    Days of Exercise per Week: 0 days    Minutes of Exercise per Session: 0 min  Stress: Stress  Concern Present (11/02/2017)   [employer] of Occupational Health - Occupational Stress Questionnaire    Feeling of Stress : To some extent  Social Connections: Unknown (11/02/2017)   Social Connection and Isolation Panel [NHANES]    Frequency of Communication with Friends and Family: Patient declined    Frequency of Social Gatherings with Friends and Family: Patient declined    Attends Religious Services: Patient declined    Database administrator or Organizations: Patient declined    Attends Banker Meetings: Patient declined    Marital Status: Widowed     Family History:  The patient's family history includes Aneurysm in her daughter; Cataracts in her mother; Heart attack in her brother, father, maternal grandfather, mother, and paternal grandfather. There is no history of Breast cancer.  ROS:   12-point review of systems is negative unless otherwise noted in the HPI.   EKGs/Labs/Other Studies Reviewed:    Studies reviewed were summarized above. The additional studies were reviewed today:  2D echo 09/2020: 1. Left ventricular ejection fraction, by estimation, is 55 to 60%. The  left ventricle has normal function. The left ventricle has no regional  wall motion abnormalities. Left ventricular diastolic parameters are  indeterminate.   2. Right ventricular systolic function is normal. The right ventricular  size is normal.   3. Left atrial size was mild to moderately dilated.   4. The mitral valve is normal in structure. Trivial mitral valve  regurgitation.   5. The aortic valve is tricuspid. Aortic valve regurgitation is not  visualized.  __________  2D echo 01/2019: 1. Left ventricular ejection fraction, by visual estimation, is 55 to  60%. The left ventricle has  normal function. There is no left ventricular  hypertrophy.   2. Left ventricular diastolic parameters are indeterminate.   3. Global right ventricle has normal systolic function.The right  ventricular size is normal. Right vetricular wall thickness was not  assessed.   4. Left atrial size was moderately dilated.   5. Right atrial size was mildly dilated.   6. The mitral valve is normal in structure. Trace mitral valve  regurgitation.   7. The tricuspid valve is normal in structure. Tricuspid valve  regurgitation is mild.   8. The aortic valve is tricuspid. Aortic valve regurgitation is not  visualized.   9. The pulmonic valve was not well visualized. Pulmonic valve  regurgitation is not visualized.  10. Mildly elevated pulmonary artery systolic pressure.  11. The inferior vena cava is normal in size with greater than 50%  respiratory variability, suggesting right atrial pressure of 3 mmHg. __________   Luci Bank patch 12/2018: Analysis time 12 days 13 hours   Atrial Fibrillation/Flutter occurred continuously (100% burden), ranging from 41-176 bpm (avg of 73 bpm).  High rate at 6:35 AM, not patient triggered Low rate at 7:45 AM, not patient triggered   Isolated VEs were rare (<1.0%), VE Couplets were rare (<1.0%), and no VE Triplets were present.   Patient triggered events were not associated with significant arrhythmia apart from the flutter __________   2D echo 06/2017: - Left ventricle: The cavity size was at the upper limits of    normal. Wall thickness was increased in a pattern of mild LVH.    Systolic function was normal. The estimated ejection fraction was    in the range of 55% to 60%. Regional wall motion abnormalities    cannot be excluded. Features are consistent with a pseudonormal    left ventricular filling  pattern, with concomitant abnormal    relaxation and increased filling pressure (grade 2 diastolic    dysfunction).  - Aortic valve: Mildly calcified annulus.  Trileaflet. There was    trivial regurgitation.  - Mitral valve: Mildly calcified annulus. There was mild    regurgitation.  - Left atrium: The atrium was moderately dilated.  - Right atrium: The atrium was mildly dilated.  - Tricuspid valve: There was mild-moderate regurgitation.  - Pulmonary arteries: Systolic pressure was mildly to moderately    increased, in the range of 35 mm Hg to 40 mm Hg plus central    venous/right atrial pressure.  - Pericardium, extracardiac: A trivial pericardial effusion was    identified posterior to the heart. __________   2D echo 07/2016: - Left ventricle: The cavity size was normal. Wall thickness was    increased in a pattern of moderate LVH. Systolic function was    normal. The estimated ejection fraction was in the range of 60%    to 65%. Features are consistent with a pseudonormal left    ventricular filling pattern, with concomitant abnormal relaxation    and increased filling pressure (grade 2 diastolic dysfunction).    Acoustic contrast opacification revealed no evidence ofthrombus.  - Mitral valve: There was mild to moderate regurgitation.  - Left atrium: The atrium was mildly dilated.  - Right atrium: The atrium was mildly to moderately dilated.  - Pulmonary arteries: Systolic pressure was mildly to moderately    increased, in the range of 40 mm Hg to 45 mm Hg. __________   2D echo 01/2016: - Left ventricle: The cavity size was normal. There was mild    concentric hypertrophy. Systolic function was normal. The    estimated ejection fraction was in the range of 55% to 60%. Wall    motion was normal; there were no regional wall motion    abnormalities. The study is not technically sufficient to allow    evaluation of LV diastolic function.  - Mitral valve: There was mild regurgitation.  - Left atrium: The atrium was mildly dilated.  - Right ventricle: Systolic function was normal.  - Pulmonary arteries: Systolic pressure was mildly  elevated. PA    peak pressure: 35 mm Hg (S).   Impressions:   - Rhythm is atrial fibrillation/flutter. __________   TEE 07/2015: - Left atrium: No evidence of thrombus in the atrial cavity or    appendage.  __________   24-hour Holter monitor 06/2015: Overall rhythm appears to be atrial flutter. Average heart rate of 78 BPM.  Minimum heart rate of 47 bpm at 4:53 AM 07/11/2015. 3% of total number of beats were in bradycardia. Maximum heart rate of 189 bpm at 10:19 AM of 07/10/2015. 11% of the total number of beats were in tachycardia. __________   2D echo 06/2015: - Left ventricle: The cavity size was normal. Wall thickness was    normal. The estimated ejection fraction was approximately 40%.    There is hypokinesis in the basal segments, normal wall motion in    the mid and apical segments.  - Mitral valve: Calcified annulus. There was mild to moderate    regurgitation.  - Left atrium: The atrium was moderately dilated.  - Right atrium: The atrium was moderately dilated.  - Tricuspid valve: There was mild-moderate regurgitation.   EKG:  EKG is ordered today.  The EKG ordered today demonstrates A-fib with aberrancy versus rare PVC, 69 bpm, LVH, baseline artifact, no acute ST-T changes  Recent Labs: 10/28/2022: ALT 12 10/30/2022: Magnesium 2.4 10/31/2022: BUN 29; Creatinine, Ser 0.80; Potassium 4.0; Sodium 135 11/02/2022: Hemoglobin 9.0; Platelets 175  Recent Lipid Panel    Component Value Date/Time   CHOL 140 01/01/2022 1653   CHOL 159 05/15/2015 1619   TRIG 94 01/01/2022 1653   TRIG 124 05/15/2015 1619   HDL 49 01/01/2022 1653   CHOLHDL 2.9 01/01/2022 1653   VLDL 19 01/01/2022 1653   VLDL 25 05/15/2015 1619   LDLCALC 72 01/01/2022 1653   LDLCALC 59 06/21/2017 1414    PHYSICAL EXAM:    VS:  BP (!) 110/50 (BP Location: Left Arm, Patient Position: Sitting, Cuff Size: Normal)   Pulse 69   Ht 5\' 6"  (1.676 m)   Wt 126 lb 2 oz (57.2 kg)   SpO2 98%   BMI 20.36 kg/m   BMI:  Body mass index is 20.36 kg/m.  Physical Exam Vitals reviewed.  Constitutional:      Appearance: She is well-developed.  HENT:     Head: Normocephalic and atraumatic.  Eyes:     General:        Right eye: No discharge.        Left eye: No discharge.  Neck:     Vascular: No JVD.  Cardiovascular:     Rate and Rhythm: Normal rate. Rhythm irregularly irregular.     Heart sounds: S1 normal and S2 normal. Heart sounds not distant. No midsystolic click and no opening snap. Murmur heard.     Systolic murmur is present with a grade of 2/6 at the upper right sternal border.     No friction rub.  Pulmonary:     Effort: Pulmonary effort is normal. No respiratory distress.     Breath sounds: Normal breath sounds. No decreased breath sounds, wheezing, rhonchi or rales.  Chest:     Chest wall: No tenderness.  Abdominal:     General: There is no distension.  Musculoskeletal:     Cervical back: Normal range of motion.     Right lower leg: No edema.     Left lower leg: No edema.  Skin:    General: Skin is warm and dry.     Nails: There is no clubbing.  Neurological:     Mental Status: She is alert and oriented to person, place, and time.  Psychiatric:        Speech: Speech normal.        Behavior: Behavior normal.        Thought Content: Thought content normal.        Judgment: Judgment normal.     Wt Readings from Last 3 Encounters:  01/13/23 126 lb 2 oz (57.2 kg)  11/19/22 131 lb (59.4 kg)  11/09/22 134 lb 9.6 oz (61.1 kg)     ASSESSMENT & PLAN:   Permanent A-fib: Ventricular rates well-controlled on Lopressor 12.5 mg twice daily.  CHA2DS2-VASc at least 7 (CHF, HTN, age x 2, DM, vascular disease, sex category).  She now qualifies for reduced dose apixaban 2.5 mg twice daily (age greater than 80, weight less than 60 kg).  Stop apixaban 5 mg twice daily and start apixaban 2.5 mg twice daily.  No further falls.  Check CMP and CBC.  History of cardiomyopathy with moderate mitral  regurgitation: Most recent echo showed normal EF and trivial mitral regurgitation.  Continue to monitor.  HTN: Blood pressure is well-controlled in the office today.  She remains on amlodipine 10 mg, lisinopril 40  mg, and Lopressor 12.5 mg twice daily.  Coronary artery calcification/aortic atherosclerosis/HLD: No symptoms suggestive of angina.  Remains on apixaban in place of aspirin given permanent A-fib/flutter in effort to minimize bleeding risk.  LDL 72 in 12/2021.  She remains on atorvastatin 20 mg.  Check CMP and lipid panel.  Acute postoperative blood loss anemia: Check CBC.  No symptoms concerning for bleeding.    Disposition: F/u with Dr. Kirke Corin or an APP in 6 months.   Medication Adjustments/Labs and Tests Ordered: Current medicines are reviewed at length with the patient today.  Concerns regarding medicines are outlined above. Medication changes, Labs and Tests ordered today are summarized above and listed in the Patient Instructions accessible in Encounters.   Signed, Eula Listen, PA-C 01/13/2023 12:14 PM      [facility] [place] [address] [place], [place] [address] [phone]

## 2023-01-13 ENCOUNTER — Ambulatory Visit: Payer: Medicare Other | Attending: Cardiovascular Disease | Admitting: Physician Assistant

## 2023-01-13 ENCOUNTER — Other Ambulatory Visit: Payer: Self-pay

## 2023-01-13 ENCOUNTER — Encounter: Payer: Self-pay | Admitting: Physician Assistant

## 2023-01-13 ENCOUNTER — Other Ambulatory Visit: Payer: Self-pay | Admitting: *Deleted

## 2023-01-13 ENCOUNTER — Telehealth: Payer: Self-pay | Admitting: Physician Assistant

## 2023-01-13 VITALS — BP 110/50 | HR 69 | Ht 66.0 in | Wt 126.1 lb

## 2023-01-13 DIAGNOSIS — I340 Nonrheumatic mitral (valve) insufficiency: Secondary | ICD-10-CM

## 2023-01-13 DIAGNOSIS — E785 Hyperlipidemia, unspecified: Secondary | ICD-10-CM | POA: Diagnosis not present

## 2023-01-13 DIAGNOSIS — I10 Essential (primary) hypertension: Secondary | ICD-10-CM

## 2023-01-13 DIAGNOSIS — I2510 Atherosclerotic heart disease of native coronary artery without angina pectoris: Secondary | ICD-10-CM

## 2023-01-13 DIAGNOSIS — I4821 Permanent atrial fibrillation: Secondary | ICD-10-CM | POA: Diagnosis not present

## 2023-01-13 DIAGNOSIS — I429 Cardiomyopathy, unspecified: Secondary | ICD-10-CM

## 2023-01-13 DIAGNOSIS — D62 Acute posthemorrhagic anemia: Secondary | ICD-10-CM

## 2023-01-13 DIAGNOSIS — I700 Atherosclerosis of aorta: Secondary | ICD-10-CM

## 2023-01-13 MED ORDER — APIXABAN 2.5 MG PO TABS: 2.5000 mg | ORAL_TABLET | Freq: Two times a day (BID) | ORAL | Status: DC

## 2023-01-13 MED ORDER — APIXABAN 2.5 MG PO TABS
2.5000 mg | ORAL_TABLET | Freq: Two times a day (BID) | ORAL | 3 refills | Status: DC
Start: 2023-01-13 — End: 2023-01-13

## 2023-01-13 NOTE — Patient Instructions (Signed)
Medication Instructions:   Your physician has recommended you make the following change in your medication:   DECREASE- Eliquis 2.5 mg tablet by mouth twice daily.  *If you need a refill on your cardiac medications before your next appointment, please call your pharmacy*   Lab Work:  Your provider would like for you to have the following labs drawn: TODAY.  CBC CMET LIPID   Please go to [facility] [address] ([facility] [address], [place] [address] You do not need an appointment.  They are open from 7:30 am-4 pm.  Lunch from 1:00 pm- 2:00 pm   If you have labs (blood work) drawn today and your tests are completely normal, you will receive your results only by: MyChart Message (if you have MyChart) OR A paper copy in the mail If you have any lab test that is abnormal or we need to change your treatment, we will call you to review the results.   Testing/Procedures:  NONE   Follow-Up: At [facility], you and your health needs are our priority.  As part of our continuing mission to provide you with exceptional heart care, we have created designated Provider Care Teams.  These Care Teams include your primary Cardiologist (physician) and Advanced Practice Providers (APPs -  Physician Assistants and Nurse Practitioners) who all work together to provide you with the care you need, when you need it.  We recommend signing up for the patient portal called "MyChart".  Sign up information is provided on this After Visit Summary.  MyChart is used to connect with patients for Virtual Visits (Telemedicine).  Patients are able to view lab/test results, encounter notes, upcoming appointments, etc.  Non-urgent messages can be sent to your provider as well.   To learn more about what you can do with MyChart, go to [email or web address].    Your next appointment:   6 month(s)  Provider:   You may see Lorine Bears, MD or one of the following Advanced  Practice Providers on your designated Care Team:   Nicolasa Ducking, NP Eula Listen, PA-C Cadence Fransico Michael, PA-C Charlsie Quest, NP

## 2023-01-13 NOTE — Telephone Encounter (Signed)
Prescription refill request for Eliquis received. Indication:afib Last office visit:10/24 Scr:0.80  8/24 Age: 89 Weight:57.2  kg  Prescription refilled

## 2023-01-13 NOTE — Telephone Encounter (Signed)
This was addressed at office visit today

## 2023-01-13 NOTE — Telephone Encounter (Signed)
Please review

## 2023-01-13 NOTE — Telephone Encounter (Signed)
*  STAT* If patient is at the pharmacy, call can be transferred to refill team.   1. Which medications need to be refilled? (please list name of each medication and dose if known)   apixaban (ELIQUIS) 2.5 MG TABS tablet    2. Which pharmacy/location (including street and city if local pharmacy) is medication to be sent to? Fax prescription to Facility that pt is currently living in at [facility] of [address] The fax number is [phone].   3. Do they need a 30 day or 90 day supply? 90 day

## 2023-01-14 LAB — COMPREHENSIVE METABOLIC PANEL
ALT: 7 [IU]/L (ref 0–32)
AST: 15 [IU]/L (ref 0–40)
Albumin: 3.9 g/dL (ref 3.7–4.7)
Alkaline Phosphatase: 136 [IU]/L — ABNORMAL HIGH (ref 44–121)
BUN/Creatinine Ratio: 12 (ref 12–28)
BUN: 8 mg/dL (ref 8–27)
Bilirubin Total: 0.5 mg/dL (ref 0.0–1.2)
CO2: 23 mmol/L (ref 20–29)
Calcium: 9.5 mg/dL (ref 8.7–10.3)
Chloride: 106 mmol/L (ref 96–106)
Creatinine, Ser: 0.66 mg/dL (ref 0.57–1.00)
Globulin, Total: 2.7 g/dL (ref 1.5–4.5)
Glucose: 106 mg/dL — ABNORMAL HIGH (ref 70–99)
Potassium: 4.3 mmol/L (ref 3.5–5.2)
Sodium: 144 mmol/L (ref 134–144)
Total Protein: 6.6 g/dL (ref 6.0–8.5)
eGFR: 84 mL/min/{1.73_m2} (ref >59)

## 2023-01-14 LAB — CBC
Hematocrit: 41.8 % (ref 34.0–46.6)
Hemoglobin: 13.4 g/dL (ref 11.1–15.9)
MCH: 31.3 pg (ref 26.6–33.0)
MCHC: 32.1 g/dL (ref 31.5–35.7)
MCV: 98 fL — ABNORMAL HIGH (ref 79–97)
Platelets: 178 10*3/uL (ref 150–450)
RBC: 4.28 x10E6/uL (ref 3.77–5.28)
RDW: 13 % (ref 11.7–15.4)
WBC: 6.5 10*3/uL (ref 3.4–10.8)

## 2023-01-14 LAB — LIPID PANEL
Chol/HDL Ratio: 2.5 {ratio} (ref 0.0–4.4)
Cholesterol, Total: 150 mg/dL (ref 100–199)
HDL: 60 mg/dL (ref >39)
LDL Chol Calc (NIH): 75 mg/dL (ref 0–99)
Triglycerides: 79 mg/dL (ref 0–149)
VLDL Cholesterol Cal: 15 mg/dL (ref 5–40)

## 2023-02-22 ENCOUNTER — Ambulatory Visit
Admission: RE | Admit: 2023-02-22 | Discharge: 2023-02-22 | Disposition: A | Discharge status: Home / Self Care | Payer: Medicare Other | Source: Ambulatory Visit | Attending: Oncology | Admitting: Oncology

## 2023-02-22 DIAGNOSIS — C50411 Malignant neoplasm of upper-outer quadrant of right female breast: Secondary | ICD-10-CM | POA: Diagnosis present

## 2023-02-24 ENCOUNTER — Ambulatory Visit: Payer: Medicare Other | Admitting: Oncology

## 2023-03-01 ENCOUNTER — Ambulatory Visit: Payer: Medicare Other | Admitting: Oncology

## 2023-03-29 ENCOUNTER — Inpatient Hospital Stay: Payer: Medicare Other | Admitting: Oncology

## 2023-04-07 ENCOUNTER — Ambulatory Visit: Payer: Medicare Other | Admitting: Family Medicine

## 2023-04-12 ENCOUNTER — Inpatient Hospital Stay: Payer: Medicare Other | Admitting: Oncology

## 2023-04-13 ENCOUNTER — Encounter: Payer: Self-pay | Admitting: Oncology

## 2023-04-13 ENCOUNTER — Inpatient Hospital Stay: Payer: Medicare Other | Attending: Oncology | Admitting: Oncology

## 2023-04-13 VITALS — BP 119/58 | HR 81 | Temp 98.4°F | Resp 16 | Ht 66.0 in | Wt 126.8 lb

## 2023-04-13 DIAGNOSIS — M810 Age-related osteoporosis without current pathological fracture: Secondary | ICD-10-CM | POA: Diagnosis not present

## 2023-04-13 DIAGNOSIS — Z170 Estrogen receptor positive status [ER+]: Secondary | ICD-10-CM | POA: Insufficient documentation

## 2023-04-13 DIAGNOSIS — Z79811 Long term (current) use of aromatase inhibitors: Secondary | ICD-10-CM | POA: Diagnosis not present

## 2023-04-13 DIAGNOSIS — C50411 Malignant neoplasm of upper-outer quadrant of right female breast: Secondary | ICD-10-CM | POA: Diagnosis present

## 2023-04-13 NOTE — Progress Notes (Signed)
 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  INTERVAL HISTORY: Patient returns to clinic today for routine yearly evaluation and discussion of her mammogram results.  She continues to take letrozole  and Fosamax  and is tolerating treatment well without significant side effects.  She had a fractured hip earlier this year, but is nearly back to her baseline.  She has no neurologic complaints.  She denies any recent fevers or illnesses.  She has a good appetite and denies weight loss.  She has no chest pain, shortness of breath, cough, or hemoptysis.  She denies any nausea, vomiting, constipation, or diarrhea.  She has no urinary complaints.  Patient offers no further specific complaints today.  REVIEW OF SYSTEMS:   Review of Systems  Constitutional: Negative.  Negative for fever, malaise/fatigue and weight loss.  Respiratory: Negative.  Negative for cough, hemoptysis and shortness of breath.   Cardiovascular: Negative.  Negative for chest pain and leg swelling.  Gastrointestinal: Negative.  Negative for abdominal pain.  Genitourinary: Negative.  Negative for dysuria.  Musculoskeletal: Negative.  Negative for back pain.  Skin: Negative.  Negative for rash.  Neurological: Negative.  Negative for dizziness, focal weakness, weakness and headaches.  Psychiatric/Behavioral: Negative.  The patient is not nervous/anxious.     As per HPI. Otherwise, a complete  review of systems is negative.  PAST MEDICAL HISTORY: Past Medical History:  Diagnosis Date   Arthritis    In hands   Atrial flutter (HCC)    a. s/p successful TEE/DCCV 07/31/2015; b. 01/2016 Amio added for Afib; c. 11/2017 Amio d/c'd 2/2 bradycardia; d. 12/2018 Back in aflutter-->Zio: Avg HR 74 (41-176) 100% Afl->bb added back; e. CHADS2VASc => 6 (CHF, HTN, age x 2, DM, female)-->eliquis  2.5 bid.   Bradycardia    a. 11/2017 noted during hospitalization @ [facility]-->amio d/c'd by cardiology.   Cataract    Chronic combined systolic (congestive) and diastolic (congestive) heart failure (HCC)    a. 06/2015 Echo: EF 40%, basal HK, nl WM of mid and apical segments, mild to mod MR/TR; b. TEE 07/31/2015: nl LV systolic function, mild to mod MR, trivial TR; c. 06/2017 Echo: EF 55-60%, mild LVH, Gr2 DD, triv AI, mild MR, mod dil LA, mildly dil RA, mild to mod TR. PASP 35-3mmHg; d. 01/2019 Echo: Ef 55-60%, mod dil LA mildly dil RA. Mild TR. Trace MR.   Diabetes mellitus without complication (HCC)    Type II   GERD (gastroesophageal reflux disease)    Glaucoma    Right Eye   Hematoma of abdominal wall    a. 11/2017 Fall-->R flank hematoma w/ anemia req PRBC. Xarelto  dc'd at that time.   History of SCC (squamous cell carcinoma) of skin 08/20/2020   left forearm EDC   Hyperlipidemia    Hypertension    Moderate mitral regurgitation    a. see echo from 06/2015 and TEE 07/2015; b. 06/2017 Echo: Mild MR.   PAF (paroxysmal atrial fibrillation) (HCC)    a.  01/2016 Admitted to [facility] with PAF -->amio  added (later dc'd 11/2017 2/2 bradycardia).   Right renal mass    a. 11/2017 CT Abd [facility]): 3.4cm R upper pole renal mass concerning for renal cell carcinoma-->outpt f/u w/ urology.    PAST SURGICAL HISTORY: Past Surgical History:  Procedure Laterality Date   BREAST BIOPSY Right 01/14/2021   us  bx 10:00 coil -INVASIVE MAMMARY CARCINOMA WITH LOBULAR FEATURES.   CATARACT EXTRACTION Bilateral    CESAREAN SECTION      ELECTROPHYSIOLOGIC STUDY N/A 07/31/2015   Procedure: CARDIOVERSION;  Surgeon: Elma Shelling, MD;  Location: [facility] ORS;  Service: Cardiovascular;  Laterality: N/A;   ELECTROPHYSIOLOGIC STUDY N/A 04/03/2016   Procedure: CARDIOVERSION;  Surgeon: Evalene JINNY Lunger, MD;  Location: [facility] ORS;  Service: Cardiovascular;  Laterality: N/A;   INTRAMEDULLARY (IM) NAIL INTERTROCHANTERIC Right 10/29/2022   Procedure: INTRAMEDULLARY (IM) NAIL INTERTROCHANTERIC;  Surgeon: Lorelle Hussar, MD;  Location: [facility] ORS;  Service: Orthopedics;  Laterality: Right;   TEE WITHOUT CARDIOVERSION N/A 07/31/2015   Procedure: TRANSESOPHAGEAL ECHOCARDIOGRAM (TEE);  Surgeon: Elma Shelling, MD;  Location: [facility] ORS;  Service: Cardiovascular;  Laterality: N/A;   WISDOM TOOTH EXTRACTION     Wrist Surgery Left     FAMILY HISTORY: Family History  Problem Relation Age of Onset   Cataracts Mother    Heart attack Mother    Heart attack Father    Aneurysm Daughter    Heart attack Maternal Grandfather    Heart attack Paternal Grandfather    Heart attack Brother    Breast cancer Neg Hx     ADVANCED DIRECTIVES (Y/N):  N  HEALTH MAINTENANCE: Social History   Tobacco Use   Smoking status: Never   Smokeless tobacco: Never   Tobacco comments:    smoking cessation materials not required  Vaping Use   Vaping status: Never Used  Substance Use Topics   Alcohol use: No   Drug use: No     Colonoscopy:  PAP:  Bone density:  Lipid panel:  No Known Allergies  Current Outpatient Medications  Medication Sig Dispense Refill   acetaminophen  (TYLENOL ) 325 MG tablet Take 650 mg by mouth every 6 (six) hours as needed.     alendronate  (FOSAMAX ) 70 MG tablet Take 1 tablet (70 mg total) by mouth once a week. Take with a full glass of water on an empty stomach. 12 tablet 0   amLODipine  (NORVASC ) 10 MG tablet Take 1 tablet (10 mg total) by mouth daily. 90 tablet 3   apixaban  (ELIQUIS ) 2.5 MG TABS tablet Take 1 tablet (2.5 mg total) by  mouth 2 (two) times daily.     ascorbic acid (VITAMIN C) 1000 MG tablet Take 1,000 mg by mouth daily.     atorvastatin  (LIPITOR) 20 MG tablet Take 20 mg by mouth daily.     b complex vitamins capsule Take 1 capsule by mouth daily.     ferrous sulfate  325 (65 FE) MG tablet Take 1 tablet (325 mg total) by mouth every other day.     furosemide  (LASIX ) 20 MG tablet Take 1 tablet (20 mg total) by mouth daily. 30 tablet 2   HYDROcodone -acetaminophen  (NORCO/VICODIN) 5-325 MG tablet Take 1 tablet by mouth every 8 (eight) hours as needed for moderate pain (pain score 4-6). 30 tablet 0   insulin  glargine (LANTUS ) 100 UNIT/ML injection Inject 12 Units into the skin at bedtime.     letrozole  (FEMARA ) 2.5 MG tablet Take 1 tablet (2.5 mg total) by mouth daily. 90 tablet 3   lisinopril  (ZESTRIL ) 40 MG  tablet Take 1 tablet (40 mg total) by mouth daily. 90 tablet 0   metoprolol  tartrate (LOPRESSOR ) 25 MG tablet TAKE (1/2) TABLET TWICE DAILY. 30 tablet 2   ondansetron  (ZOFRAN ) 4 MG tablet Take 4 mg by mouth every 4 (four) hours as needed.     polyethylene glycol (MIRALAX  / GLYCOLAX ) 17 g packet Take 17 g by mouth daily.     senna (SENOKOT) 8.6 MG TABS tablet Take 2 tablets by mouth at bedtime.     Zinc Oxide (TRIPLE PASTE) 12.8 % ointment Apply 1 Application topically. To buttocks every shift.     Vitamin D, Ergocalciferol, (DRISDOL) 1.25 MG (50000 UNIT) CAPS capsule Take 50,000 Units by mouth once a week. (Patient not taking: Reported on 01/13/2023)     No current facility-administered medications for this visit.    OBJECTIVE: Vitals:   04/13/23 1408  BP: (!) 119/58  Pulse: 81  Resp: 16  Temp: 98.4 F (36.9 C)  SpO2: 100%     Body mass index is 20.47 kg/m.    ECOG FS:0 - Asymptomatic  General: Thin, no acute distress. Eyes: Pink conjunctiva, anicteric sclera. HEENT: Normocephalic, moist mucous membranes. Lungs: No audible wheezing or coughing. Heart: Regular rate and rhythm. Abdomen: Soft,  nontender, no obvious distention. Musculoskeletal: No edema, cyanosis, or clubbing. Neuro: Alert, answering all questions appropriately. Cranial nerves grossly intact. Skin: No rashes or petechiae noted. Psych: Normal affect.   LAB RESULTS:  Lab Results  Component Value Date   NA 144 01/13/2023   K 4.3 01/13/2023   CL 106 01/13/2023   CO2 23 01/13/2023   GLUCOSE 106 (H) 01/13/2023   BUN 8 01/13/2023   CREATININE 0.66 01/13/2023   CALCIUM  9.5 01/13/2023   PROT 6.6 01/13/2023   ALBUMIN 3.9 01/13/2023   AST 15 01/13/2023   ALT 7 01/13/2023   ALKPHOS 136 (H) 01/13/2023   BILITOT 0.5 01/13/2023   GFRNONAA >60 10/31/2022   GFRAA 96 01/05/2019    Lab Results  Component Value Date   WBC 6.5 01/13/2023   NEUTROABS 8.3 (H) 10/30/2022   HGB 13.4 01/13/2023   HCT 41.8 01/13/2023   MCV 98 (H) 01/13/2023   PLT 178 01/13/2023     STUDIES: No results found.  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  She was initiated on letrozole  which is recommended that she take indefinitely.  No further intervention is needed.  Patient's most recent mammogram on February 22, 2023 continued to be BI-RADS 6 with possible mild progression of disease.  Continue letrozole  indefinitely as above.  Patient and daughter continue to have right breast frequent follow-up, therefore will return to clinic in November 2025 for mammogram and routine evaluation.   Osteoporosis: Patient underwent bone mineral density on March 10, 2021 which revealed a T score of -4.6.  Continue Fosamax , calcium , and vitamin D.  Repeat bone mineral density in the next 1 to 2 weeks.   Patient expressed understanding and was in agreement with  this plan. She also understands that She can call clinic at any time with any questions, concerns, or complaints.    Cancer Staging  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, right [facility]) Staging form: Breast, AJCC 8th Edition - Clinical stage from 01/23/2021:  Stage IIA (cT3, cN0, cM0, G1, ER+, PR+, HER2-) - Signed by Jacobo Evalene PARAS, MD on 01/23/2021 Stage prefix: Initial diagnosis Histologic grading system: 3 grade system  Evalene PARAS Jacobo, MD   04/13/2023 4:06 PM

## 2023-04-27 ENCOUNTER — Emergency Department: Payer: Medicare Other

## 2023-04-27 ENCOUNTER — Other Ambulatory Visit: Payer: Self-pay

## 2023-04-27 ENCOUNTER — Emergency Department
Admission: EM | Admit: 2023-04-27 | Discharge: 2023-04-27 | Disposition: A | Discharge status: Skilled Nursing Facility | Payer: Medicare Other | Attending: Emergency Medicine | Admitting: Emergency Medicine

## 2023-04-27 DIAGNOSIS — R531 Weakness: Secondary | ICD-10-CM | POA: Diagnosis not present

## 2023-04-27 DIAGNOSIS — J111 Influenza due to unidentified influenza virus with other respiratory manifestations: Secondary | ICD-10-CM | POA: Insufficient documentation

## 2023-04-27 DIAGNOSIS — R059 Cough, unspecified: Secondary | ICD-10-CM | POA: Diagnosis present

## 2023-04-27 DIAGNOSIS — Z20822 Contact with and (suspected) exposure to covid-19: Secondary | ICD-10-CM | POA: Insufficient documentation

## 2023-04-27 DIAGNOSIS — J101 Influenza due to other identified influenza virus with other respiratory manifestations: Secondary | ICD-10-CM

## 2023-04-27 LAB — BASIC METABOLIC PANEL
Anion gap: 14 (ref 5–15)
BUN: 13 mg/dL (ref 8–23)
CO2: 23 mmol/L (ref 22–32)
Calcium: 8.8 mg/dL — ABNORMAL LOW (ref 8.9–10.3)
Chloride: 99 mmol/L (ref 98–111)
Creatinine, Ser: 0.83 mg/dL (ref 0.44–1.00)
GFR, Estimated: >60 mL/min (ref >60)
Glucose, Bld: 115 mg/dL — ABNORMAL HIGH (ref 70–99)
Potassium: 3.6 mmol/L (ref 3.5–5.1)
Sodium: 136 mmol/L (ref 135–145)

## 2023-04-27 LAB — RESP PANEL BY RT-PCR (RSV, FLU A&B, COVID)  RVPGX2
Influenza A by PCR: POSITIVE — AB
Influenza B by PCR: NEGATIVE
Resp Syncytial Virus by PCR: NEGATIVE
SARS Coronavirus 2 by RT PCR: NEGATIVE

## 2023-04-27 LAB — CBC WITH DIFFERENTIAL/PLATELET
Abs Immature Granulocytes: 0.04 10*3/uL (ref 0.00–0.07)
Basophils Absolute: 0 10*3/uL (ref 0.0–0.1)
Basophils Relative: 1 %
Eosinophils Absolute: 0.2 10*3/uL (ref 0.0–0.5)
Eosinophils Relative: 2 %
HCT: 40 % (ref 36.0–46.0)
Hemoglobin: 14 g/dL (ref 12.0–15.0)
Immature Granulocytes: 1 %
Lymphocytes Relative: 8 %
Lymphs Abs: 0.5 10*3/uL — ABNORMAL LOW (ref 0.7–4.0)
MCH: 32.3 pg (ref 26.0–34.0)
MCHC: 35 g/dL (ref 30.0–36.0)
MCV: 92.2 fL (ref 80.0–100.0)
Monocytes Absolute: 0.7 10*3/uL (ref 0.1–1.0)
Monocytes Relative: 12 %
Neutro Abs: 4.7 10*3/uL (ref 1.7–7.7)
Neutrophils Relative %: 76 %
Platelets: 154 10*3/uL (ref 150–400)
RBC: 4.34 MIL/uL (ref 3.87–5.11)
RDW: 13.8 % (ref 11.5–15.5)
WBC: 6.1 10*3/uL (ref 4.0–10.5)
nRBC: 0 % (ref 0.0–0.2)

## 2023-04-27 MED ORDER — ACETAMINOPHEN 325 MG PO TABS
650.0000 mg | ORAL_TABLET | Freq: Once | ORAL | Status: AC
  Administered 2023-04-27: 650 mg via ORAL
  Filled 2023-04-27: qty 2

## 2023-04-27 MED ORDER — ACETAMINOPHEN 500 MG PO TABS: 500.0000 mg | ORAL_TABLET | Freq: Three times a day (TID) | ORAL | 0 refills | Status: AC | PRN

## 2023-04-27 MED ORDER — OSELTAMIVIR PHOSPHATE 75 MG PO CAPS: 75.0000 mg | ORAL_CAPSULE | Freq: Two times a day (BID) | ORAL | 0 refills | Status: AC

## 2023-04-27 NOTE — Discharge Instructions (Signed)
You have been diagnosed with influenza A, weakness.  Please drink plenty of fluids.  Please take Tamiflu 1 capsule by mouth 2 times per day for 5 days.  Please take acetaminophen 500 mg every 8 hours as needed for pain.  Please come back to ED or go to your PCP if you have new symptoms or symptoms worsen.

## 2023-04-27 NOTE — ED Notes (Signed)
Updated daughter on plan of care.

## 2023-04-27 NOTE — ED Notes (Signed)
Pt resting, awaiting arrival of EMS transport

## 2023-04-27 NOTE — ED Notes (Signed)
[facility]  CALLED  FOR  TRANSPORT  TO  [facility]  [place]

## 2023-04-27 NOTE — ED Triage Notes (Signed)
Pt arrived via EMS from the [facility] [address] Pt has been having weakness and diarrhea. Per EMS the facility where pt lives is having a Flu outbreak.

## 2023-04-27 NOTE — ED Provider Triage Note (Signed)
Emergency Medicine Provider Triage Evaluation Note  ZYAH GOMM , a 89 y.o. female  was evaluated in triage.  Pt complains of weakness and diarrhea that started today.   Arrived via EMS from the [facility] [address]    Review of Systems  Positive: + diarrhea  Negative:   Physical Exam  Ht 5\' 6"  (1.676 m)   Wt 57.5 kg   BMI 20.47 kg/m  Gen:   Awake, no distress   Alert.  Talkative Resp:  Normal effort  Lungs clear MSK:   Moves extremities without difficulty  Other:    Medical Decision Making  Medically screening exam initiated at 1:03 PM.  Appropriate orders placed.  Elvera Lennox was informed that the remainder of the evaluation will be completed by another provider, this initial triage assessment does not replace that evaluation, and the importance of remaining in the ED until their evaluation is complete.     Tommi Rumps, PA-C 04/27/23 [phone]

## 2023-04-27 NOTE — ED Provider Notes (Signed)
[facility] Provider Note    Event Date/Time   First MD Initiated Contact with Patient 04/27/23 1505     (approximate)   History   Weakness   HPI  Amber Wolfe is a 89 y.o. female who came today from [place] and is here with her daughter.  Patient states having diarrhea in the last 3 days, nauseas,  vomit, productive cough, nasal congestion.  Patient states the facility is having flu outbreak.  Patient is feeling weak. Yesterday she fell on her back, without loss of consciousness.  Patient states having cephalalgia.     Physical Exam   Triage Vital Signs: ED Triage Vitals  Encounter Vitals Group     BP 04/27/23 1303 (!) 117/98     Systolic BP Percentile --      Diastolic BP Percentile --      Pulse Rate 04/27/23 1303 92     Resp 04/27/23 1303 17     Temp 04/27/23 1303 98.1 F (36.7 C)     Temp Source 04/27/23 1303 Oral     SpO2 04/27/23 1303 98 %     Weight 04/27/23 1302 126 lb 12.8 oz (57.5 kg)     Height 04/27/23 1302 5\' 6"  (1.676 m)     Head Circumference --      Peak Flow --      Pain Score 04/27/23 1302 0     Pain Loc --      Pain Education --      Exclude from Growth Chart --     Most recent vital signs: Vitals:   04/27/23 1620 04/27/23 1624  BP: (!) 126/49 (!) 126/49  Pulse: 85 81  Resp: 14 14  Temp: 99.5 F (37.5 C) 99.5 F (37.5 C)  SpO2:  97%     Constitutional: Alert, afebrile, mild distress. Eyes: Conjunctivae are normal.  Head: Atraumatic. Nose: No congestion/rhinnorhea. Mouth/Throat: Mucous membranes are moist.  No peritonsillar erythema Neck: Painless ROM.  Cardiovascular:   Good peripheral circulation. Respiratory: Normal respiratory effort.  No retractions.  Right base: Crackles Gastrointestinal: Soft and nontender.  Musculoskeletal:  no deformity Neurologic:  MAE spontaneously. No gross focal neurologic deficits are appreciated.  Skin:  Skin is warm, dry and intact. No rash noted. Psychiatric: Mood  and affect are normal. Speech and behavior are normal.    ED Results / Procedures / Treatments   Labs (all labs ordered are listed, but only abnormal results are displayed) Labs Reviewed  RESP PANEL BY RT-PCR (RSV, FLU A&B, COVID)  [id number] - Abnormal; Notable for the following components:      Result Value   Influenza A by PCR POSITIVE (*)    All other components within normal limits  BASIC METABOLIC PANEL - Abnormal; Notable for the following components:   Glucose, Bld 115 (*)    Calcium 8.8 (*)    All other components within normal limits  CBC WITH DIFFERENTIAL/PLATELET - Abnormal; Notable for the following components:   Lymphs Abs 0.5 (*)    All other components within normal limits     EKG     RADIOLOGY I independently reviewed and interpreted imaging and agree with radiologists findings.      PROCEDURES:  Critical Care performed:   Procedures   MEDICATIONS ORDERED IN ED: Medications  acetaminophen (TYLENOL) tablet 650 mg (650 mg Oral Given 04/27/23 1636)     IMPRESSION / MDM / ASSESSMENT AND PLAN / ED COURSE  I reviewed the  triage vital signs and the nursing notes.  Differential diagnosis includes, but is not limited to, influenza A, pneumonia, atelectasis, dehydration, COVID  Patient's presentation is most consistent with acute complicated illness / injury requiring diagnostic workup.   Patient's diagnosis is consistent with influenza A, weakness. I independently reviewed and interpreted imaging and agree with radiologists findings. Labs are rea reassuring. I did review the patient's allergies and medications. Patient will be discharged home with prescriptions for Tamiflu, acetaminophen 500 mg. Patient is to follow up with PCP as needed or otherwise directed. Patient is given ED precautions to return to the ED for any worsening or new symptoms. Discussed plan of care with patient, answered all of patient's questions, Patient agreeable to plan of care.  Advised patient to take medications according to the instructions on the label. Discussed possible side effects of new medications. Patient verbalized understanding. Clinical Course as of 04/27/23 1743  Tue Apr 27, 2023  1635 DG Chest 2 View  Low lung volumes with chronic interstitial coarsening. No acute findings. 2. Mild cardiomegaly.   [AE]    Clinical Course User Index [AE] Gladys Damme, PA-C     FINAL CLINICAL IMPRESSION(S) / ED DIAGNOSES   Final diagnoses:  Influenza A  Weakness     Rx / DC Orders   ED Discharge Orders          Ordered    oseltamivir (TAMIFLU) 75 MG capsule  2 times daily        04/27/23 1743    acetaminophen (TYLENOL) 500 MG tablet  Every 8 hours PRN        04/27/23 1743             Note:  This document was prepared using Dragon voice recognition software and may include unintentional dictation errors.   Gladys Damme, PA-C 04/27/23 1743    Janith Lima, MD 04/27/23 [phone]

## 2023-08-05 ENCOUNTER — Ambulatory Visit: Admitting: Medical

## 2023-08-10 NOTE — Progress Notes (Deleted)
 Cardiology Office Note    Date:  08/10/2023   ID:  Amber Wolfe, DOB [birth year], MRN [id number]  PCP:  Sharyne Degree, FNP  Cardiologist:  Antionette Kirks, MD  Electrophysiologist:  None   Chief Complaint: Follow-up  History of Present Illness:   Amber Wolfe is a 89 y.o. female with history of coronary artery calcification noted on CT imaging, cardiomyopathy, permanent atrial flutter/fib, breast cancer diagnosed in 2022 with declination for surgery, chemotherapy, or radiation, aortic atherosclerosis, DM2, HTN, HLD, and mitral regurgitation who presents for follow-up of coronary artery calcification, cardiomyopathy and A. Fib/flutter.   She was previously followed by Dr. Lina Render.  Nuclear stress test in 05/2015 showed no evidence of ischemia or perfusion defects with a normal EF.  She was diagnosed with atrial flutter in early 2017.  In this setting, she was noted to have a mildly reduced LV systolic function with an EF of 40% by echo in 06/2015.  She subsequently underwent DCCV in 07/2015.  She had recurrent A. fib in 01/2016 and was placed on amiodarone  with repeat successful DCCV in 03/2016.  Follow-up echo in 07/2016 showed an improved LV systolic function with an EF of 60 to 65%, moderate LVH, grade 2 diastolic dysfunction, mild to moderate mitral regurgitation, mildly dilated left atrium, mildly to moderately dilated right atrium, and PASP 40 to 45 mmHg.  Her beta-blocker has previously been discontinued and amiodarone  reduced to 100 mg daily in the setting of bradycardia.  Echo in 06/2017 showed a normal LV systolic function with mild mitral regurgitation and mild to moderate tricuspid regurgitation.  She was admitted to [facility] in 11/2017 in the setting of recurrent falls and right flank hematoma with significant anemia.  During her admission she required 1 unit of PRBC and Xarelto  was placed on hold.  Amiodarone  was discontinued in the setting of bradycardia.  Oral anticoagulation ultimately ended  up being held until 08/2018, at which time she was doing well and had not had any recurrent falls leading her to be placed on Eliquis  2.5 mg twice daily with stable hemoglobin.  She was noted to be back in rate controlled atrial flutter in 12/2018.  Out of concern for previous drop in EF, when in atrial flutter, echo was obtained in 01/2019 which showed a normal LV systolic function with an EF of 55 to 60% with trace mitral regurgitation.  Subsequent outpatient cardiac monitoring, to assess heart rates given prior history of bradycardia, showed an average heart rate of 73 bpm with a low of 41 bpm mostly occurring during the overnight hours.  Her highest heart rate was 176 bpm and she remained in A. fib/flutter throughout the monitoring period.  Given this, she was placed on metoprolol  25 mg twice daily.      She was admitted to [facility] in 09/2022 with a mechanical fall with right intertrochanteric femur fracture status post ORIF.  She did hit her head, though did not suffer LOC.  CT head showed no evidence of intracranial injury.  Admission was also notable for acute post operative blood loss anemia with hemoglobin trending from 12.8 on admission to 7.1 requiring 1 unit PRBC.  CTA chest was obtained during the admission which was negative for PE or acute aortic process.  Progression of fibrotic pulmonary changes along with coronary artery calcification and aortic atherosclerosis were noted.  She was last seen in the office in 12/2018 for and was doing well from a cardiac perspective.  Based on dosing criteria  she was transitioned to low-dose apixaban  2.5 mg twice daily.  ***   Labs independently reviewed: 03/2023 - Hgb 14.0, PLT 154, potassium 3.6, BUN 13, serum creatinine 0.83 12/2022 - TC 150, TG 79, HDL 60, LDL 75, albumin 3.9, AST/ALT normal 10/2022 - A1c 6.9 12/2018 - TSH normal  Past Medical History:  Diagnosis Date   Arthritis    In hands   Atrial flutter (HCC)    a. s/p successful TEE/DCCV  07/31/2015; b. 01/2016 Amio added for Afib; c. 11/2017 Amio d/c'd 2/2 bradycardia; d. 12/2018 Back in aflutter-->Zio: Avg HR 74 (41-176) 100% Afl->bb added back; e. CHADS2VASc => 6 (CHF, HTN, age x 2, DM, female)-->eliquis  2.5 bid.   Bradycardia    a. 11/2017 noted during hospitalization @ [facility]-->amio d/c'd by cardiology.   Cataract    Chronic combined systolic (congestive) and diastolic (congestive) heart failure (HCC)    a. 06/2015 Echo: EF 40%, basal HK, nl WM of mid and apical segments, mild to mod MR/TR; b. TEE 07/31/2015: nl LV systolic function, mild to mod MR, trivial TR; c. 06/2017 Echo: EF 55-60%, mild LVH, Gr2 DD, triv AI, mild MR, mod dil LA, mildly dil RA, mild to mod TR. PASP 35-40mmHg; d. 01/2019 Echo: Ef 55-60%, mod dil LA mildly dil RA. Mild TR. Trace MR.   Diabetes mellitus without complication (HCC)    Type II   GERD (gastroesophageal reflux disease)    Glaucoma    Right Eye   Hematoma of abdominal wall    a. 11/2017 Fall-->R flank hematoma w/ anemia req PRBC. Xarelto  dc'd at that time.   History of SCC (squamous cell carcinoma) of skin 08/20/2020   left forearm EDC   Hyperlipidemia    Hypertension    Moderate mitral regurgitation    a. see echo from 06/2015 and TEE 07/2015; b. 06/2017 Echo: Mild MR.   PAF (paroxysmal atrial fibrillation) (HCC)    a.  01/2016 Admitted to [facility] with PAF -->amio added (later dc'd 11/2017 2/2 bradycardia).   Right renal mass    a. 11/2017 CT Abd [facility]): 3.4cm R upper pole renal mass concerning for renal cell carcinoma-->outpt f/u w/ urology.    Past Surgical History:  Procedure Laterality Date   BREAST BIOPSY Right 01/14/2021   us  bx 10:00 coil -INVASIVE MAMMARY CARCINOMA WITH LOBULAR FEATURES.   CATARACT EXTRACTION Bilateral    CESAREAN SECTION     ELECTROPHYSIOLOGIC STUDY N/A 07/31/2015   Procedure: CARDIOVERSION;  Surgeon: Margette Sheldon, MD;  Location: [facility] ORS;  Service: Cardiovascular;  Laterality: N/A;   ELECTROPHYSIOLOGIC STUDY N/A 04/03/2016    Procedure: CARDIOVERSION;  Surgeon: Devorah Fonder, MD;  Location: [facility] ORS;  Service: Cardiovascular;  Laterality: N/A;   INTRAMEDULLARY (IM) NAIL INTERTROCHANTERIC Right 10/29/2022   Procedure: INTRAMEDULLARY (IM) NAIL INTERTROCHANTERIC;  Surgeon: Venus Ginsberg, MD;  Location: [facility] ORS;  Service: Orthopedics;  Laterality: Right;   TEE WITHOUT CARDIOVERSION N/A 07/31/2015   Procedure: TRANSESOPHAGEAL ECHOCARDIOGRAM (TEE);  Surgeon: Margette Sheldon, MD;  Location: [facility] ORS;  Service: Cardiovascular;  Laterality: N/A;   WISDOM TOOTH EXTRACTION     Wrist Surgery Left     Current Medications: No outpatient medications have been marked as taking for the 08/11/23 encounter (Appointment) with Roark Chick, PA-C.    Allergies:   Patient has no known allergies.   Social History   Socioeconomic History   Marital status: Widowed    Spouse name: Not on file   Number of children: 1   Years of  education: Not on file   Highest education level: 12th grade  Occupational History   Occupation: Retired  Tobacco Use   Smoking status: Never   Smokeless tobacco: Never   Tobacco comments:    smoking cessation materials not required  Vaping Use   Vaping status: Never Used  Substance and Sexual Activity   Alcohol use: No   Drug use: No   Sexual activity: Not Currently  Other Topics Concern   Not on file  Social History Narrative   Not on file   Social Drivers of Health   Financial Resource Strain: Low Risk  (11/02/2017)   Overall Financial Resource Strain (CARDIA)    Difficulty of Paying Living Expenses: Not hard at all  Food Insecurity: No Food Insecurity (10/29/2022)   Hunger Vital Sign    Worried About Running Out of Food in the Last Year: Never true    Ran Out of Food in the Last Year: Never true  Transportation Needs: No Transportation Needs (10/28/2022)   PRAPARE - Administrator, Civil Service (Medical): No    Lack of Transportation (Non-Medical): No  Physical Activity:  Inactive (11/02/2017)   Exercise Vital Sign    Days of Exercise per Week: 0 days    Minutes of Exercise per Session: 0 min  Stress: Stress Concern Present (11/02/2017)   [employer] of Occupational Health - Occupational Stress Questionnaire    Feeling of Stress : To some extent  Social Connections: Unknown (11/02/2017)   Social Connection and Isolation Panel [NHANES]    Frequency of Communication with Friends and Family: Patient declined    Frequency of Social Gatherings with Friends and Family: Patient declined    Attends Religious Services: Patient declined    Database administrator or Organizations: Patient declined    Attends Banker Meetings: Patient declined    Marital Status: Widowed     Family History:  The patient's family history includes Aneurysm in her daughter; Cataracts in her mother; Heart attack in her brother, father, maternal grandfather, mother, and paternal grandfather. There is no history of Breast cancer.  ROS:   12-point review of systems is negative unless otherwise noted in the HPI.   EKGs/Labs/Other Studies Reviewed:    Studies reviewed were summarized above. The additional studies were reviewed today:  2D echo 09/2020: 1. Left ventricular ejection fraction, by estimation, is 55 to 60%. The  left ventricle has normal function. The left ventricle has no regional  wall motion abnormalities. Left ventricular diastolic parameters are  indeterminate.   2. Right ventricular systolic function is normal. The right ventricular  size is normal.   3. Left atrial size was mild to moderately dilated.   4. The mitral valve is normal in structure. Trivial mitral valve  regurgitation.   5. The aortic valve is tricuspid. Aortic valve regurgitation is not  visualized.  __________   2D echo 01/2019: 1. Left ventricular ejection fraction, by visual estimation, is 55 to  60%. The left ventricle has normal function. There is no left ventricular   hypertrophy.   2. Left ventricular diastolic parameters are indeterminate.   3. Global right ventricle has normal systolic function.The right  ventricular size is normal. Right vetricular wall thickness was not  assessed.   4. Left atrial size was moderately dilated.   5. Right atrial size was mildly dilated.   6. The mitral valve is normal in structure. Trace mitral valve  regurgitation.   7. The tricuspid valve  is normal in structure. Tricuspid valve  regurgitation is mild.   8. The aortic valve is tricuspid. Aortic valve regurgitation is not  visualized.   9. The pulmonic valve was not well visualized. Pulmonic valve  regurgitation is not visualized.  10. Mildly elevated pulmonary artery systolic pressure.  11. The inferior vena cava is normal in size with greater than 50%  respiratory variability, suggesting right atrial pressure of 3 mmHg. __________   Zio patch 12/2018: Analysis time 12 days 13 hours   Atrial Fibrillation/Flutter occurred continuously (100% burden), ranging from 41-176 bpm (avg of 73 bpm).  High rate at 6:35 AM, not patient triggered Low rate at 7:45 AM, not patient triggered   Isolated VEs were rare (<1.0%), VE Couplets were rare (<1.0%), and no VE Triplets were present.   Patient triggered events were not associated with significant arrhythmia apart from the flutter __________   2D echo 06/2017: - Left ventricle: The cavity size was at the upper limits of    normal. Wall thickness was increased in a pattern of mild LVH.    Systolic function was normal. The estimated ejection fraction was    in the range of 55% to 60%. Regional wall motion abnormalities    cannot be excluded. Features are consistent with a pseudonormal    left ventricular filling pattern, with concomitant abnormal    relaxation and increased filling pressure (grade 2 diastolic    dysfunction).  - Aortic valve: Mildly calcified annulus. Trileaflet. There was    trivial regurgitation.   - Mitral valve: Mildly calcified annulus. There was mild    regurgitation.  - Left atrium: The atrium was moderately dilated.  - Right atrium: The atrium was mildly dilated.  - Tricuspid valve: There was mild-moderate regurgitation.  - Pulmonary arteries: Systolic pressure was mildly to moderately    increased, in the range of 35 mm Hg to 40 mm Hg plus central    venous/right atrial pressure.  - Pericardium, extracardiac: A trivial pericardial effusion was    identified posterior to the heart. __________   2D echo 07/2016: - Left ventricle: The cavity size was normal. Wall thickness was    increased in a pattern of moderate LVH. Systolic function was    normal. The estimated ejection fraction was in the range of 60%    to 65%. Features are consistent with a pseudonormal left    ventricular filling pattern, with concomitant abnormal relaxation    and increased filling pressure (grade 2 diastolic dysfunction).    Acoustic contrast opacification revealed no evidence ofthrombus.  - Mitral valve: There was mild to moderate regurgitation.  - Left atrium: The atrium was mildly dilated.  - Right atrium: The atrium was mildly to moderately dilated.  - Pulmonary arteries: Systolic pressure was mildly to moderately    increased, in the range of 40 mm Hg to 45 mm Hg. __________   2D echo 01/2016: - Left ventricle: The cavity size was normal. There was mild    concentric hypertrophy. Systolic function was normal. The    estimated ejection fraction was in the range of 55% to 60%. Wall    motion was normal; there were no regional wall motion    abnormalities. The study is not technically sufficient to allow    evaluation of LV diastolic function.  - Mitral valve: There was mild regurgitation.  - Left atrium: The atrium was mildly dilated.  - Right ventricle: Systolic function was normal.  - Pulmonary arteries: Systolic pressure  was mildly elevated. PA    peak pressure: 35 mm Hg (S).    Impressions:   - Rhythm is atrial fibrillation/flutter. __________   TEE 07/2015: - Left atrium: No evidence of thrombus in the atrial cavity or    appendage.  __________   24-hour Holter monitor 06/2015: Overall rhythm appears to be atrial flutter. Average heart rate of 78 BPM.  Minimum heart rate of 47 bpm at 4:53 AM 07/11/2015. 3% of total number of beats were in bradycardia. Maximum heart rate of 189 bpm at 10:19 AM of 07/10/2015. 11% of the total number of beats were in tachycardia. __________   2D echo 06/2015: - Left ventricle: The cavity size was normal. Wall thickness was    normal. The estimated ejection fraction was approximately 40%.    There is hypokinesis in the basal segments, normal wall motion in    the mid and apical segments.  - Mitral valve: Calcified annulus. There was mild to moderate    regurgitation.  - Left atrium: The atrium was moderately dilated.  - Right atrium: The atrium was moderately dilated.  - Tricuspid valve: There was mild-moderate regurgitation.   EKG:  EKG is ordered today.  The EKG ordered today demonstrates ***  Recent Labs: 10/30/2022: Magnesium  2.4 01/13/2023: ALT 7 04/27/2023: BUN 13; Creatinine, Ser 0.83; Hemoglobin 14.0; Platelets 154; Potassium 3.6; Sodium 136  Recent Lipid Panel    Component Value Date/Time   CHOL 150 01/13/2023 1154   CHOL 159 05/15/2015 1619   TRIG 79 01/13/2023 1154   TRIG 124 05/15/2015 1619   HDL 60 01/13/2023 1154   CHOLHDL 2.5 01/13/2023 1154   CHOLHDL 2.9 01/01/2022 1653   VLDL 19 01/01/2022 1653   VLDL 25 05/15/2015 1619   LDLCALC 75 01/13/2023 1154   LDLCALC 59 06/21/2017 1414    PHYSICAL EXAM:    VS:  There were no vitals taken for this visit.  BMI: There is no height or weight on file to calculate BMI.  Physical Exam  Wt Readings from Last 3 Encounters:  04/27/23 126 lb 12.8 oz (57.5 kg)  04/13/23 126 lb 12.8 oz (57.5 kg)  01/13/23 126 lb 2 oz (57.2 kg)     ASSESSMENT & PLAN:    Permanent A-fib: ***.  CHA2DS2-VASc at least 7 (CHF, HTN, age x 2, DM, vascular disease, sex category).   History of cardiomyopathy with moderate mitral regurgitation:  HTN: Blood pressure  Coronary artery calcification/aortic atherosclerosis/HLD: ***.  LDL 75 in 12/2022.   {Are you ordering a CV Procedure (e.g. stress test, cath, DCCV, TEE, etc)?   Press F2        :[id number]}     Disposition: F/u with Dr. Alvenia Aus or an APP in ***.   Medication Adjustments/Labs and Tests Ordered: Current medicines are reviewed at length with the patient today.  Concerns regarding medicines are outlined above. Medication changes, Labs and Tests ordered today are summarized above and listed in the Patient Instructions accessible in Encounters.   Signed, Varney Gentleman, PA-C 08/10/2023 5:23 PM      [facility] [address] [place], [place] [address] [phone]

## 2023-08-11 ENCOUNTER — Ambulatory Visit: Admitting: Physician Assistant

## 2023-08-20 NOTE — Progress Notes (Unsigned)
 Cardiology Office Note    Date:  08/25/2023   ID:  Amber Wolfe, DOB May 02, 1931, MRN [id number]  PCP:  Sharyne Degree, FNP  Cardiologist:  Antionette Kirks, MD  Electrophysiologist:  None   Chief Complaint: Follow-up  History of Present Illness:   Amber Wolfe is a 89 y.o. female with history of coronary artery calcification noted on CT imaging, cardiomyopathy, permanent atrial flutter/fib, breast cancer diagnosed in 2022 with declination for surgery, chemotherapy, or radiation, aortic atherosclerosis, DM2, HTN, HLD, and mitral regurgitation who presents for follow-up of coronary artery calcification, cardiomyopathy and A. Fib/flutter.   She was previously followed by Dr. Lina Render.  Nuclear stress test in 05/2015 showed no evidence of ischemia or perfusion defects with a normal EF.  She was diagnosed with atrial flutter in early 2017.  In this setting, she was noted to have a mildly reduced LV systolic function with an EF of 40% by echo in 06/2015.  She subsequently underwent DCCV in 07/2015.  She had recurrent A. fib in 01/2016 and was placed on amiodarone  with repeat successful DCCV in 03/2016.  Follow-up echo in 07/2016 showed an improved LV systolic function with an EF of 60 to 65%, moderate LVH, grade 2 diastolic dysfunction, mild to moderate mitral regurgitation, mildly dilated left atrium, mildly to moderately dilated right atrium, and PASP 40 to 45 mmHg.  Her beta-blocker has previously been discontinued and amiodarone  reduced to 100 mg daily in the setting of bradycardia.  Echo in 06/2017 showed a normal LV systolic function with mild mitral regurgitation and mild to moderate tricuspid regurgitation.  She was admitted to [facility] in 11/2017 in the setting of recurrent falls and right flank hematoma with significant anemia.  During her admission she required 1 unit of PRBC and Xarelto  was placed on hold.  Amiodarone  was discontinued in the setting of bradycardia.  Oral anticoagulation ultimately ended  up being held until 08/2018, at which time she was doing well and had not had any recurrent falls leading her to be placed on Eliquis  2.5 mg twice daily with stable hemoglobin.  She was noted to be back in rate controlled atrial flutter in 12/2018.  Out of concern for previous drop in EF, when in atrial flutter, echo was obtained in 01/2019 which showed a normal LV systolic function with an EF of 55 to 60% with trace mitral regurgitation.  Subsequent outpatient cardiac monitoring, to assess heart rates given prior history of bradycardia, showed an average heart rate of 73 bpm with a low of 41 bpm mostly occurring during the overnight hours.  Her highest heart rate was 176 bpm and she remained in A. fib/flutter throughout the monitoring period.  Given this, she was placed on metoprolol  25 mg twice daily.      She was admitted to [facility] in 09/2022 with a mechanical fall with right intertrochanteric femur fracture status post ORIF.  She did hit her head, though did not suffer LOC.  CT head showed no evidence of intracranial injury.  Admission was also notable for acute post operative blood loss anemia with hemoglobin trending from 12.8 on admission to 7.1 requiring 1 unit PRBC.  CTA chest was obtained during the admission which was negative for PE or acute aortic process.  Progression of fibrotic pulmonary changes along with coronary artery calcification and aortic atherosclerosis were noted.  She was last seen in the office in 12/2022 and was doing well from a cardiac perspective.  Based on dosing criteria she  was transitioned to low-dose apixaban  2.5 mg twice daily.  She comes in continuing to do well from a cardiac perspective and is without symptoms of angina or cardiac decompensation.  No dyspnea, dizziness, presyncope, or syncope.  No falls, hematochezia, or melena.  She will rarely note brief, self-limited, palpitations.  No significant lower extremity swelling or progressive orthopnea.  Overall feels like she  is doing well and does not have any acute cardiac concerns at this time.   Labs independently reviewed: 03/2023 - Hgb 14.0, PLT 154, potassium 3.6, BUN 13, serum creatinine 0.83 12/2022 - TC 150, TG 79, HDL 60, LDL 75, albumin 3.9, AST/ALT normal 10/2022 - A1c 6.9 12/2018 - TSH normal  Past Medical History:  Diagnosis Date   Arthritis    In hands   Atrial flutter (HCC)    a. s/p successful TEE/DCCV 07/31/2015; b. 01/2016 Amio added for Afib; c. 11/2017 Amio d/c'd 2/2 bradycardia; d. 12/2018 Back in aflutter-->Zio: Avg HR 74 (41-176) 100% Afl->bb added back; e. CHADS2VASc => 6 (CHF, HTN, age x 2, DM, female)-->eliquis  2.5 bid.   Bradycardia    a. 11/2017 noted during hospitalization @ [facility]-->amio d/c'd by cardiology.   Cataract    Chronic combined systolic (congestive) and diastolic (congestive) heart failure (HCC)    a. 06/2015 Echo: EF 40%, basal HK, nl WM of mid and apical segments, mild to mod MR/TR; b. TEE 07/31/2015: nl LV systolic function, mild to mod MR, trivial TR; c. 06/2017 Echo: EF 55-60%, mild LVH, Gr2 DD, triv AI, mild MR, mod dil LA, mildly dil RA, mild to mod TR. PASP 35-41mmHg; d. 01/2019 Echo: Ef 55-60%, mod dil LA mildly dil RA. Mild TR. Trace MR.   Diabetes mellitus without complication (HCC)    Type II   GERD (gastroesophageal reflux disease)    Glaucoma    Right Eye   Hematoma of abdominal wall    a. 11/2017 Fall-->R flank hematoma w/ anemia req PRBC. Xarelto  dc'd at that time.   History of SCC (squamous cell carcinoma) of skin 08/20/2020   left forearm EDC   Hyperlipidemia    Hypertension    Moderate mitral regurgitation    a. see echo from 06/2015 and TEE 07/2015; b. 06/2017 Echo: Mild MR.   PAF (paroxysmal atrial fibrillation) (HCC)    a.  01/2016 Admitted to [facility] with PAF -->amio added (later dc'd 11/2017 2/2 bradycardia).   Right renal mass    a. 11/2017 CT [facility]): 3.4cm R upper pole renal mass concerning for renal cell carcinoma-->outpt f/u w/ urology.    Past  Surgical History:  Procedure Laterality Date   BREAST BIOPSY Right 01/14/2021   us  bx 10:00 coil -INVASIVE MAMMARY CARCINOMA WITH LOBULAR FEATURES.   CATARACT EXTRACTION Bilateral    CESAREAN SECTION     ELECTROPHYSIOLOGIC STUDY N/A 07/31/2015   Procedure: CARDIOVERSION;  Surgeon: Margette Sheldon, MD;  Location: [facility] ORS;  Service: Cardiovascular;  Laterality: N/A;   ELECTROPHYSIOLOGIC STUDY N/A 04/03/2016   Procedure: CARDIOVERSION;  Surgeon: Devorah Fonder, MD;  Location: [facility] ORS;  Service: Cardiovascular;  Laterality: N/A;   INTRAMEDULLARY (IM) NAIL INTERTROCHANTERIC Right 10/29/2022   Procedure: INTRAMEDULLARY (IM) NAIL INTERTROCHANTERIC;  Surgeon: Venus Ginsberg, MD;  Location: [facility] ORS;  Service: Orthopedics;  Laterality: Right;   TEE WITHOUT CARDIOVERSION N/A 07/31/2015   Procedure: TRANSESOPHAGEAL ECHOCARDIOGRAM (TEE);  Surgeon: Margette Sheldon, MD;  Location: [facility] ORS;  Service: Cardiovascular;  Laterality: N/A;   WISDOM TOOTH EXTRACTION     Wrist Surgery  Left     Current Medications: Current Meds  Medication Sig   Acetaminophen  Extra Strength 500 MG TABS Take 2 tablets by mouth 2 (two) times daily.   alendronate  (FOSAMAX ) 70 MG tablet Take 1 tablet (70 mg total) by mouth once a week. Take with a full glass of water on an empty stomach.   alum & mag hydroxide-simeth (GERI-LANTA) 200-200-20 MG/5ML suspension Take by mouth every 6 (six) hours as needed for indigestion or heartburn.   amLODipine  (NORVASC ) 10 MG tablet Take 1 tablet (10 mg total) by mouth daily.   apixaban  (ELIQUIS ) 2.5 MG TABS tablet Take 1 tablet (2.5 mg total) by mouth 2 (two) times daily.   ascorbic acid (VITAMIN C) 1000 MG tablet Take 1,000 mg by mouth daily.   atorvastatin  (LIPITOR) 20 MG tablet Take 20 mg by mouth daily.   b complex vitamins capsule Take 1 capsule by mouth daily.   diphenhydrAMINE (BENADRYL) 25 mg capsule Take 25 mg by mouth every 6 (six) hours as needed for allergies.   ferrous sulfate  325  (65 FE) MG tablet Take 1 tablet (325 mg total) by mouth every other day.   furosemide  (LASIX ) 20 MG tablet Take 1 tablet (20 mg total) by mouth daily.   HYDROcodone -acetaminophen  (NORCO/VICODIN) 5-325 MG tablet Take by mouth.   insulin  glargine (LANTUS ) 100 UNIT/ML injection Inject 12 Units into the skin at bedtime.   letrozole  (FEMARA ) 2.5 MG tablet Take 1 tablet (2.5 mg total) by mouth daily.   lisinopril  (ZESTRIL ) 40 MG tablet Take 1 tablet (40 mg total) by mouth daily.   metoprolol  tartrate (LOPRESSOR ) 25 MG tablet TAKE (1/2) TABLET TWICE DAILY.   ondansetron  (ZOFRAN ) 4 MG tablet Take 4 mg by mouth every 4 (four) hours as needed.   polyethylene glycol (MIRALAX  / GLYCOLAX ) 17 g packet Take 17 g by mouth daily.   senna (SENOKOT) 8.6 MG TABS tablet Take 2 tablets by mouth at bedtime.   Zinc Oxide (TRIPLE PASTE) 12.8 % ointment Apply 1 Application topically. To buttocks every shift.    Allergies:   Patient has no known allergies.   Social History   Socioeconomic History   Marital status: Widowed    Spouse name: Not on file   Number of children: 1   Years of education: Not on file   Highest education level: 12th grade  Occupational History   Occupation: Retired  Tobacco Use   Smoking status: Never   Smokeless tobacco: Never   Tobacco comments:    smoking cessation materials not required  Vaping Use   Vaping status: Never Used  Substance and Sexual Activity   Alcohol use: No   Drug use: No   Sexual activity: Not Currently  Other Topics Concern   Not on file  Social History Narrative   Not on file   Social Drivers of Health   Financial Resource Strain: Low Risk  (11/02/2017)   Overall Financial Resource Strain (CARDIA)    Difficulty of Paying Living Expenses: Not hard at all  Food Insecurity: No Food Insecurity (10/29/2022)   Hunger Vital Sign    Worried About Running Out of Food in the Last Year: Never true    Ran Out of Food in the Last Year: Never true  Transportation  Needs: No Transportation Needs (10/28/2022)   PRAPARE - Administrator, Civil Service (Medical): No    Lack of Transportation (Non-Medical): No  Physical Activity: Inactive (11/02/2017)   Exercise Vital Sign  Days of Exercise per Week: 0 days    Minutes of Exercise per Session: 0 min  Stress: Stress Concern Present (11/02/2017)   [employer] of Occupational Health - Occupational Stress Questionnaire    Feeling of Stress : To some extent  Social Connections: Unknown (11/02/2017)   Social Connection and Isolation Panel [NHANES]    Frequency of Communication with Friends and Family: Patient declined    Frequency of Social Gatherings with Friends and Family: Patient declined    Attends Religious Services: Patient declined    Database administrator or Organizations: Patient declined    Attends Banker Meetings: Patient declined    Marital Status: Widowed     Family History:  The patient's family history includes Aneurysm in her daughter; Cataracts in her mother; Heart attack in her brother, father, maternal grandfather, mother, and paternal grandfather. There is no history of Breast cancer.  ROS:   12-point review of systems is negative unless otherwise noted in the HPI.   EKGs/Labs/Other Studies Reviewed:    Studies reviewed were summarized above. The additional studies were reviewed today:  2D echo 09/2020: 1. Left ventricular ejection fraction, by estimation, is 55 to 60%. The  left ventricle has normal function. The left ventricle has no regional  wall motion abnormalities. Left ventricular diastolic parameters are  indeterminate.   2. Right ventricular systolic function is normal. The right ventricular  size is normal.   3. Left atrial size was mild to moderately dilated.   4. The mitral valve is normal in structure. Trivial mitral valve  regurgitation.   5. The aortic valve is tricuspid. Aortic valve regurgitation is not  visualized.  __________    2D echo 01/2019: 1. Left ventricular ejection fraction, by visual estimation, is 55 to  60%. The left ventricle has normal function. There is no left ventricular  hypertrophy.   2. Left ventricular diastolic parameters are indeterminate.   3. Global right ventricle has normal systolic function.The right  ventricular size is normal. Right vetricular wall thickness was not  assessed.   4. Left atrial size was moderately dilated.   5. Right atrial size was mildly dilated.   6. The mitral valve is normal in structure. Trace mitral valve  regurgitation.   7. The tricuspid valve is normal in structure. Tricuspid valve  regurgitation is mild.   8. The aortic valve is tricuspid. Aortic valve regurgitation is not  visualized.   9. The pulmonic valve was not well visualized. Pulmonic valve  regurgitation is not visualized.  10. Mildly elevated pulmonary artery systolic pressure.  11. The inferior vena cava is normal in size with greater than 50%  respiratory variability, suggesting right atrial pressure of 3 mmHg. __________   Zio patch 12/2018: Analysis time 12 days 13 hours   Atrial Fibrillation/Flutter occurred continuously (100% burden), ranging from 41-176 bpm (avg of 73 bpm).  High rate at 6:35 AM, not patient triggered Low rate at 7:45 AM, not patient triggered   Isolated VEs were rare (<1.0%), VE Couplets were rare (<1.0%), and no VE Triplets were present.   Patient triggered events were not associated with significant arrhythmia apart from the flutter __________   2D echo 06/2017: - Left ventricle: The cavity size was at the upper limits of    normal. Wall thickness was increased in a pattern of mild LVH.    Systolic function was normal. The estimated ejection fraction was    in the range of 55% to 60%. Regional  wall motion abnormalities    cannot be excluded. Features are consistent with a pseudonormal    left ventricular filling pattern, with concomitant abnormal     relaxation and increased filling pressure (grade 2 diastolic    dysfunction).  - Aortic valve: Mildly calcified annulus. Trileaflet. There was    trivial regurgitation.  - Mitral valve: Mildly calcified annulus. There was mild    regurgitation.  - Left atrium: The atrium was moderately dilated.  - Right atrium: The atrium was mildly dilated.  - Tricuspid valve: There was mild-moderate regurgitation.  - Pulmonary arteries: Systolic pressure was mildly to moderately    increased, in the range of 35 mm Hg to 40 mm Hg plus central    venous/right atrial pressure.  - Pericardium, extracardiac: A trivial pericardial effusion was    identified posterior to the heart. __________   2D echo 07/2016: - Left ventricle: The cavity size was normal. Wall thickness was    increased in a pattern of moderate LVH. Systolic function was    normal. The estimated ejection fraction was in the range of 60%    to 65%. Features are consistent with a pseudonormal left    ventricular filling pattern, with concomitant abnormal relaxation    and increased filling pressure (grade 2 diastolic dysfunction).    Acoustic contrast opacification revealed no evidence ofthrombus.  - Mitral valve: There was mild to moderate regurgitation.  - Left atrium: The atrium was mildly dilated.  - Right atrium: The atrium was mildly to moderately dilated.  - Pulmonary arteries: Systolic pressure was mildly to moderately    increased, in the range of 40 mm Hg to 45 mm Hg. __________   2D echo 01/2016: - Left ventricle: The cavity size was normal. There was mild    concentric hypertrophy. Systolic function was normal. The    estimated ejection fraction was in the range of 55% to 60%. Wall    motion was normal; there were no regional wall motion    abnormalities. The study is not technically sufficient to allow    evaluation of LV diastolic function.  - Mitral valve: There was mild regurgitation.  - Left atrium: The atrium was  mildly dilated.  - Right ventricle: Systolic function was normal.  - Pulmonary arteries: Systolic pressure was mildly elevated. PA    peak pressure: 35 mm Hg (S).   Impressions:   - Rhythm is atrial fibrillation/flutter. __________   TEE 07/2015: - Left atrium: No evidence of thrombus in the atrial cavity or    appendage.  __________   24-hour Holter monitor 06/2015: Overall rhythm appears to be atrial flutter. Average heart rate of 78 BPM.  Minimum heart rate of 47 bpm at 4:53 AM 07/11/2015. 3% of total number of beats were in bradycardia. Maximum heart rate of 189 bpm at 10:19 AM of 07/10/2015. 11% of the total number of beats were in tachycardia. __________   2D echo 06/2015: - Left ventricle: The cavity size was normal. Wall thickness was    normal. The estimated ejection fraction was approximately 40%.    There is hypokinesis in the basal segments, normal wall motion in    the mid and apical segments.  - Mitral valve: Calcified annulus. There was mild to moderate    regurgitation.  - Left atrium: The atrium was moderately dilated.  - Right atrium: The atrium was moderately dilated.  - Tricuspid valve: There was mild-moderate regurgitation.   EKG:  EKG is ordered today.  The EKG ordered today demonstrates A-fib, 64 bpm, left axis deviation, poor R wave progression along the precordial leads, LVH, no acute ST-T changes  Recent Labs: 10/30/2022: Magnesium  2.4 01/13/2023: ALT 7 04/27/2023: BUN 13; Creatinine, Ser 0.83; Hemoglobin 14.0; Platelets 154; Potassium 3.6; Sodium 136  Recent Lipid Panel    Component Value Date/Time   CHOL 150 01/13/2023 1154   CHOL 159 05/15/2015 1619   TRIG 79 01/13/2023 1154   TRIG 124 05/15/2015 1619   HDL 60 01/13/2023 1154   CHOLHDL 2.5 01/13/2023 1154   CHOLHDL 2.9 01/01/2022 1653   VLDL 19 01/01/2022 1653   VLDL 25 05/15/2015 1619   LDLCALC 75 01/13/2023 1154   LDLCALC 59 06/21/2017 1414    PHYSICAL EXAM:    VS:  BP (!) 112/54    Pulse 74   Ht 5\' 6"  (1.676 m)   Wt 124 lb 12.8 oz (56.6 kg)   SpO2 97%   BMI 20.14 kg/m   BMI: Body mass index is 20.14 kg/m.  Physical Exam Vitals reviewed.  Constitutional:      Appearance: She is well-developed.  HENT:     Head: Normocephalic and atraumatic.  Eyes:     General:        Right eye: No discharge.        Left eye: No discharge.  Cardiovascular:     Rate and Rhythm: Normal rate. Rhythm irregularly irregular.     Heart sounds: S1 normal and S2 normal. Heart sounds not distant. No midsystolic click and no opening snap. Murmur heard.     Systolic murmur is present with a grade of 2/6 at the upper right sternal border.     No friction rub.  Pulmonary:     Effort: Pulmonary effort is normal. No respiratory distress.     Breath sounds: Normal breath sounds. No decreased breath sounds, wheezing, rhonchi or rales.  Chest:     Chest wall: No tenderness.  Musculoskeletal:     Cervical back: Normal range of motion.     Right lower leg: No edema.     Left lower leg: No edema.  Skin:    General: Skin is warm and dry.     Nails: There is no clubbing.  Neurological:     Mental Status: She is alert and oriented to person, place, and time.  Psychiatric:        Speech: Speech normal.        Behavior: Behavior normal.        Thought Content: Thought content normal.        Judgment: Judgment normal.     Wt Readings from Last 3 Encounters:  08/25/23 124 lb 12.8 oz (56.6 kg)  04/27/23 126 lb 12.8 oz (57.5 kg)  04/13/23 126 lb 12.8 oz (57.5 kg)     ASSESSMENT & PLAN:   Permanent A-fib: Ventricular rate well-controlled on Lopressor  12.5 mg twice daily.  CHA2DS2-VASc at least 7 (CHF, HTN, age x 2, DM, vascular disease, sex category).  She remains on renally dosed apixaban  2.5 mg twice daily given age greater than 80 and weight less than 60 kg.  Recent labs stable.  No falls or symptoms concerning for bleeding.  History of cardiomyopathy with moderate mitral regurgitation:  Euvolemic and well compensated.  Echo in 2022 with preserved LV systolic function and trivial mitral regurgitation.  Weight is stable.  Remains on low-dose furosemide  20 mg daily along with lisinopril  40 mg and Lopressor  12.5 mg twice daily.  Given advanced age, lack of heart failure symptoms, and with preserved LV systolic function defer further escalation of GDMT at this time.  Repeat echo in 6 months at time of next visit.  HTN: Blood pressure is well-controlled in the office today.  She remains on amlodipine  10 mg, lisinopril  40 mg, and Lopressor  12.5 mg twice daily.  Coronary artery calcification/aortic atherosclerosis/HLD: No symptoms suggestive of angina.  LDL 75 in 12/2022.  She remains on apixaban  as outlined above in place of aspirin  given A-fib to minimize bleeding risk.  She remains on atorvastatin  20 mg.    Disposition: F/u with Dr. Alvenia Aus or an APP in 6 months.   Medication Adjustments/Labs and Tests Ordered: Current medicines are reviewed at length with the patient today.  Concerns regarding medicines are outlined above. Medication changes, Labs and Tests ordered today are summarized above and listed in the Patient Instructions accessible in Encounters.   Signed, Varney Gentleman, PA-C 08/25/2023 12:52 PM     [facility] - [place] [address] [place], [place] [address] [phone]

## 2023-08-25 ENCOUNTER — Encounter: Payer: Self-pay | Admitting: Physician Assistant

## 2023-08-25 ENCOUNTER — Ambulatory Visit: Attending: Physician Assistant | Admitting: Physician Assistant

## 2023-08-25 VITALS — BP 112/54 | HR 74 | Ht 66.0 in | Wt 124.8 lb

## 2023-08-25 DIAGNOSIS — E785 Hyperlipidemia, unspecified: Secondary | ICD-10-CM

## 2023-08-25 DIAGNOSIS — I2510 Atherosclerotic heart disease of native coronary artery without angina pectoris: Secondary | ICD-10-CM

## 2023-08-25 DIAGNOSIS — I340 Nonrheumatic mitral (valve) insufficiency: Secondary | ICD-10-CM

## 2023-08-25 DIAGNOSIS — I10 Essential (primary) hypertension: Secondary | ICD-10-CM

## 2023-08-25 DIAGNOSIS — I4821 Permanent atrial fibrillation: Secondary | ICD-10-CM | POA: Diagnosis not present

## 2023-08-25 DIAGNOSIS — I429 Cardiomyopathy, unspecified: Secondary | ICD-10-CM | POA: Diagnosis not present

## 2023-08-25 DIAGNOSIS — I700 Atherosclerosis of aorta: Secondary | ICD-10-CM

## 2023-08-25 NOTE — Patient Instructions (Signed)
 Medication Instructions:  Your physician recommends that you continue on your current medications as directed. Please refer to the Current Medication list given to you today.  *If you need a refill on your cardiac medications before your next appointment, please call your pharmacy*  Lab Work: No labs ordered today  If you have labs (blood work) drawn today and your tests are completely normal, you will receive your results only by: MyChart Message (if you have MyChart) OR A paper copy in the mail If you have any lab test that is abnormal or we need to change your treatment, we will call you to review the results.  Testing/Procedures:  ECHOCARDIOGRAM in 6 months.  Your physician has requested that you have an echocardiogram. Echocardiography is a painless test that uses sound waves to create images of your heart. It provides your doctor with information about the size and shape of your heart and how well your heart's chambers and valves are working.   You may receive an ultrasound enhancing agent through an IV if needed to better visualize your heart during the echo. This procedure takes approximately one hour.  There are no restrictions for this procedure.  This will take place at [address] [facility]) [address], [place] [address]  Please note: We ask at that you not bring children with you during ultrasound (echo/ vascular) testing. Due to room size and safety concerns, children are not allowed in the ultrasound rooms during exams. Our front office staff cannot provide observation of children in our lobby area while testing is being conducted. An adult accompanying a patient to their appointment will only be allowed in the ultrasound room at the discretion of the ultrasound technician under special circumstances. We apologize for any inconvenience.   Follow-Up: At [facility], you and your health needs are our priority.  As part of our continuing mission to  provide you with exceptional heart care, our providers are all part of one team.  This team includes your primary Cardiologist (physician) and Advanced Practice Providers or APPs (Physician Assistants and Nurse Practitioners) who all work together to provide you with the care you need, when you need it.  Your next appointment:    6 month(s)  Please schedule the same day as the Echocardiogram   Provider:   Antionette Kirks, MD, Varney Gentleman PA

## 2023-09-06 ENCOUNTER — Other Ambulatory Visit

## 2023-10-05 ENCOUNTER — Other Ambulatory Visit: Payer: Self-pay | Admitting: Oncology

## 2023-10-05 DIAGNOSIS — C50919 Malignant neoplasm of unspecified site of unspecified female breast: Secondary | ICD-10-CM

## 2023-11-09 ENCOUNTER — Ambulatory Visit
Admission: RE | Admit: 2023-11-09 | Discharge: 2023-11-09 | Disposition: A | Discharge status: Home / Self Care | Source: Ambulatory Visit | Attending: Oncology | Admitting: Oncology

## 2023-11-09 DIAGNOSIS — Z780 Asymptomatic menopausal state: Secondary | ICD-10-CM | POA: Insufficient documentation

## 2023-11-09 DIAGNOSIS — Z13820 Encounter for screening for osteoporosis: Secondary | ICD-10-CM | POA: Diagnosis present

## 2023-11-09 DIAGNOSIS — Z79811 Long term (current) use of aromatase inhibitors: Secondary | ICD-10-CM | POA: Insufficient documentation

## 2023-11-09 DIAGNOSIS — M810 Age-related osteoporosis without current pathological fracture: Secondary | ICD-10-CM | POA: Insufficient documentation

## 2024-02-28 ENCOUNTER — Other Ambulatory Visit: Payer: Self-pay

## 2024-02-28 ENCOUNTER — Emergency Department

## 2024-02-28 ENCOUNTER — Ambulatory Visit: Payer: Self-pay | Admitting: Physician Assistant

## 2024-02-28 ENCOUNTER — Emergency Department
Admission: EM | Admit: 2024-02-28 | Discharge: 2024-02-28 | Disposition: A | Discharge status: Nursing Home (Includes ICF) | Source: Ambulatory Visit | Attending: Emergency Medicine | Admitting: Emergency Medicine

## 2024-02-28 ENCOUNTER — Ambulatory Visit: Attending: Physician Assistant

## 2024-02-28 DIAGNOSIS — R441 Visual hallucinations: Secondary | ICD-10-CM | POA: Insufficient documentation

## 2024-02-28 DIAGNOSIS — I10 Essential (primary) hypertension: Secondary | ICD-10-CM | POA: Insufficient documentation

## 2024-02-28 DIAGNOSIS — N3000 Acute cystitis without hematuria: Secondary | ICD-10-CM | POA: Insufficient documentation

## 2024-02-28 DIAGNOSIS — Z7901 Long term (current) use of anticoagulants: Secondary | ICD-10-CM | POA: Insufficient documentation

## 2024-02-28 DIAGNOSIS — E119 Type 2 diabetes mellitus without complications: Secondary | ICD-10-CM | POA: Insufficient documentation

## 2024-02-28 DIAGNOSIS — F0390 Unspecified dementia without behavioral disturbance: Secondary | ICD-10-CM | POA: Insufficient documentation

## 2024-02-28 DIAGNOSIS — I340 Nonrheumatic mitral (valve) insufficiency: Secondary | ICD-10-CM | POA: Diagnosis not present

## 2024-02-28 DIAGNOSIS — R4182 Altered mental status, unspecified: Secondary | ICD-10-CM | POA: Diagnosis present

## 2024-02-28 LAB — CBC WITH DIFFERENTIAL/PLATELET
Abs Immature Granulocytes: 0.03 K/uL (ref 0.00–0.07)
Basophils Absolute: 0 K/uL (ref 0.0–0.1)
Basophils Relative: 1 %
Eosinophils Absolute: 0.1 K/uL (ref 0.0–0.5)
Eosinophils Relative: 2 %
HCT: 35.8 % — ABNORMAL LOW (ref 36.0–46.0)
Hemoglobin: 12.1 g/dL (ref 12.0–15.0)
Immature Granulocytes: 1 %
Lymphocytes Relative: 18 %
Lymphs Abs: 1.2 K/uL (ref 0.7–4.0)
MCH: 33.2 pg (ref 26.0–34.0)
MCHC: 33.8 g/dL (ref 30.0–36.0)
MCV: 98.1 fL (ref 80.0–100.0)
Monocytes Absolute: 0.7 K/uL (ref 0.1–1.0)
Monocytes Relative: 11 %
Neutro Abs: 4.4 K/uL (ref 1.7–7.7)
Neutrophils Relative %: 67 %
Platelets: 180 K/uL (ref 150–400)
RBC: 3.65 MIL/uL — ABNORMAL LOW (ref 3.87–5.11)
RDW: 12.6 % (ref 11.5–15.5)
WBC: 6.5 K/uL (ref 4.0–10.5)
nRBC: 0 % (ref 0.0–0.2)

## 2024-02-28 LAB — URINALYSIS, ROUTINE W REFLEX MICROSCOPIC
Bilirubin Urine: NEGATIVE
Glucose, UA: NEGATIVE mg/dL
Hgb urine dipstick: NEGATIVE
Ketones, ur: NEGATIVE mg/dL
Nitrite: NEGATIVE
Protein, ur: NEGATIVE mg/dL
Specific Gravity, Urine: 1.004 — ABNORMAL LOW (ref 1.005–1.030)
pH: 5 (ref 5.0–8.0)

## 2024-02-28 LAB — BASIC METABOLIC PANEL WITH GFR
Anion gap: 13 (ref 5–15)
BUN: 16 mg/dL (ref 8–23)
CO2: 23 mmol/L (ref 22–32)
Calcium: 9.4 mg/dL (ref 8.9–10.3)
Chloride: 104 mmol/L (ref 98–111)
Creatinine, Ser: 0.81 mg/dL (ref 0.44–1.00)
GFR, Estimated: >60 mL/min (ref >60)
Glucose, Bld: 184 mg/dL — ABNORMAL HIGH (ref 70–99)
Potassium: 4 mmol/L (ref 3.5–5.1)
Sodium: 140 mmol/L (ref 135–145)

## 2024-02-28 LAB — ECHOCARDIOGRAM COMPLETE: S' Lateral: 3.38 cm

## 2024-02-28 MED ORDER — CEFUROXIME AXETIL 250 MG PO TABS
250.0000 mg | ORAL_TABLET | Freq: Once | ORAL | Status: AC
  Administered 2024-02-28: 250 mg via ORAL
  Filled 2024-02-28: qty 1

## 2024-02-28 MED ORDER — CEFUROXIME AXETIL 250 MG PO TABS: 250.0000 mg | ORAL_TABLET | Freq: Two times a day (BID) | ORAL | 0 refills | Status: DC

## 2024-02-28 MED ORDER — CEFUROXIME AXETIL 250 MG PO TABS: 250.0000 mg | ORAL_TABLET | Freq: Two times a day (BID) | ORAL | 0 refills | Status: AC

## 2024-02-28 NOTE — ED Triage Notes (Signed)
 Lives at [place] [facility] [address]  Patient states she saw a cat under her bed last night.  AAO x 2.  Skin warm and dry. NAD

## 2024-02-28 NOTE — Discharge Instructions (Addendum)
 Ceftin/cefuroxime antibiotics twice daily for 5 days.  We gave 1 dose in the ED  Urine sent for culture  Continue all of the medications, return to the ED with any worsening symptoms despite these measures

## 2024-02-28 NOTE — ED Provider Notes (Signed)
 [facility] Provider Note    Event Date/Time   First MD Initiated Contact with Patient 02/28/24 [phone]     (approximate)   History   Altered Mental Status   HPI  Amber Wolfe is a 89 y.o. female who presents to the ED for evaluation of Altered Mental Status   I reviewed cardiology clinic visit from May.  Permanent A-fib, HTN, DM, HLD, low-dose apixaban .  Dementia  Patient presents to the ED from a local SNF accompanied by 2 daughters for evaluation of altered mentation over the past 1 day, specifically visual hallucinations.  Patient acknowledges that she has seen a cat around her room in her SNF in the past 1 day and other people have not seen it.  She reports she feels fine otherwise.  Daughters report that this is quite unusual for her.  One of the daughter saw her yesterday morning and she was normal, yesterday evening the symptoms developed and been present for the past 12 or so hours.  They report the patient did have a fall a few weeks ago where she struck her left sided chest wall/ribs   Physical Exam   Triage Vital Signs: ED Triage Vitals [02/28/24 0915]  Encounter Vitals Group     BP 127/67     Girls Systolic BP Percentile      Girls Diastolic BP Percentile      Boys Systolic BP Percentile      Boys Diastolic BP Percentile      Pulse Rate 81     Resp 16     Temp 97.6 F (36.4 C)     Temp Source Oral     SpO2 100 %     Weight 124 lb 12.5 oz (56.6 kg)     Height      Head Circumference      Peak Flow      Pain Score 0     Pain Loc      Pain Education      Exclude from Growth Chart     Most recent vital signs: Vitals:   02/28/24 0930 02/28/24 1121  BP: (!) 134/50 (!) 145/69  Pulse: 69 74  Resp: 18 18  Temp:  97.7 F (36.5 C)  SpO2: 100% 100%    General: Awake, no distress.  CV:  Good peripheral perfusion.  Resp:  Normal effort.  Abd:  No distention.  Mild suprapubic tenderness, otherwise nontender MSK:  No deformity  noted.  Mild tenderness to left lateral chest wall.  Neuro:  No focal deficits appreciated. Other:     ED Results / Procedures / Treatments   Labs (all labs ordered are listed, but only abnormal results are displayed) Labs Reviewed  CBC WITH DIFFERENTIAL/PLATELET - Abnormal; Notable for the following components:      Result Value   RBC 3.65 (*)    HCT 35.8 (*)    All other components within normal limits  BASIC METABOLIC PANEL WITH GFR - Abnormal; Notable for the following components:   Glucose, Bld 184 (*)    All other components within normal limits  URINALYSIS, ROUTINE W REFLEX MICROSCOPIC - Abnormal; Notable for the following components:   Color, Urine YELLOW (*)    APPearance CLEAR (*)    Specific Gravity, Urine 1.004 (*)    Leukocytes,Ua MODERATE (*)    Bacteria, UA RARE (*)    All other components within normal limits  URINE CULTURE    EKG  RADIOLOGY CT head interpreted by me without evidence of acute intracranial pathology  Official radiology report(s): DG Chest Portable 1 View Result Date: 02/28/2024 CLINICAL DATA:  Left-sided chest pain and altered mental status after falling 2 weeks ago. Evaluate infiltrate. EXAM: PORTABLE CHEST 1 VIEW COMPARISON:  None recent. Chest radiographs 04/27/2023 and 10/28/2022. CT 10/28/2022. FINDINGS: 1108 hours. The heart size and mediastinal contours are stable with cardiomegaly and aortic atherosclerosis. Grossly stable chronic lung disease with scattered subpleural reticulation and scarring. No superimposed airspace disease, edema, pneumothorax or significant pleural effusion identified. Stable chronic posttraumatic deformity of the left humeral neck and convex right thoracolumbar scoliosis. No acute osseous findings are seen. IMPRESSION: 1. Stable chronic lung disease. No acute cardiopulmonary process identified. 2. Stable cardiomegaly and aortic atherosclerosis. Electronically Signed   By: Elsie Perone M.D.   On: 02/28/2024 11:39    CT HEAD WO CONTRAST ( ) Result Date: 02/28/2024 EXAM: CT HEAD WITHOUT 02/28/2024 10:14:03 AM TECHNIQUE: CT of the head was performed without the administration of intravenous contrast. Automated exposure control, iterative reconstruction, and/or weight based adjustment of the mA/kV was utilized to reduce the radiation dose to as low as reasonably achievable. COMPARISON: 10/28/2022 CLINICAL HISTORY: eval ICH, on AC, acute hallucinations FINDINGS: BRAIN AND VENTRICLES: No acute intracranial hemorrhage. No mass effect or midline shift. No extra-axial fluid collection. No evidence of acute infarct. Patchy and confluent decreased attenuation throughout deep and periventricular white matter bilaterally, compatible with chronic microvascular ischemic disease. Cerebral ventricle sizes concordant with degree of cerebral volume loss. No hydrocephalus. Atherosclerotic calcifications within cavernous internal carotid arteries. ORBITS: Bilateral lens replacement noted. SINUSES AND MASTOIDS: No acute abnormality. SOFT TISSUES AND SKULL: No acute skull fracture. No acute soft tissue abnormality. IMPRESSION: 1. No acute intracranial abnormality. 2. Chronic microvascular ischemic disease in the deep and periventricular white matter bilaterally. 3. Cerebral volume loss with ventricle sizes concordant with the degree of volume loss. Electronically signed by: Evalene Coho MD 02/28/2024 10:57 AM EST RP Workstation: [id number]    PROCEDURES and INTERVENTIONS:  Procedures  Medications  cefUROXime (CEFTIN) tablet 250 mg (has no administration in time range)     IMPRESSION / MDM / ASSESSMENT AND PLAN / ED COURSE  I reviewed the triage vital signs and the nursing notes.  Differential diagnosis includes, but is not limited to, acute metabolic encephalopathy, UTI, pneumonia, progression of dementia, dehydration or AKI, ICH  {Patient presents with symptoms of an acute illness or injury that is potentially  life-threatening.  Patient presents to the ED with new acute visual hallucinations, possibly metabolic encephalopathy from a UTI and suitable for outpatient management with antibiotics.  Looks well to me.  On exam has some mild suprapubic tenderness but no signs of particular toxidromes, neurologic deficits, trauma or injury.  Normal CBC and metabolic panel.  Urine with bacteria and moderate leukocytes, sent for a culture and will start a short course of antibiotics for the next 5 days to treat the possibility of cystitis.  Otherwise benign workup, clear CT head and CXR.  Family is comfortable with her returning to her facility and I think this is reasonable.  Discussed ED return precautions  Clinical Course as of 02/28/24 1208  Mon Feb 28, 2024  1204 Reassessed an discussed workup. Could be UTI, sending urine for a culture. Patient and family agreeable [DS]    Clinical Course User Index [DS] Claudene Rover, MD     FINAL CLINICAL IMPRESSION(S) / ED DIAGNOSES   Final diagnoses:  Visual hallucinations  Acute cystitis without hematuria     Rx / DC Orders   ED Discharge Orders          Ordered    cefUROXime (CEFTIN) 250 MG tablet  2 times daily with meals        02/28/24 1205             Note:  This document was prepared using Dragon voice recognition software and may include unintentional dictation errors.   Claudene Rover, MD 02/28/24 1210

## 2024-03-01 ENCOUNTER — Inpatient Hospital Stay: Admission: RE | Admit: 2024-03-01 | Discharge: 2024-03-01 | Attending: Oncology | Admitting: Oncology

## 2024-03-01 DIAGNOSIS — C50919 Malignant neoplasm of unspecified site of unspecified female breast: Secondary | ICD-10-CM | POA: Insufficient documentation

## 2024-03-09 NOTE — Progress Notes (Unsigned)
 Cardiology Office Note    Date:  03/15/2024   ID:  Amber Wolfe, DOB [birth year], MRN [id number]  PCP:  Donal Channing SQUIBB, FNP  Cardiologist:  Deatrice Cage, MD  Electrophysiologist:  None   Chief Complaint: Follow up  History of Present Illness:   Amber Wolfe is a 89 y.o. female with history of coronary artery calcification noted on CT imaging, cardiomyopathy, permanent atrial flutter/fib, breast cancer diagnosed in 2022 with declination for surgery, chemotherapy, or radiation, aortic atherosclerosis, DM2, HTN, HLD, and mitral regurgitation who presents for follow-up of echo.  She was previously followed by Dr. Monette.  Nuclear stress test in 05/2015 showed no evidence of ischemia or perfusion defects with a normal EF.  She was diagnosed with atrial flutter in early 2017.  In this setting, she was noted to have a mildly reduced LV systolic function with an EF of 40% by echo in 06/2015.  She subsequently underwent DCCV in 07/2015.  She had recurrent A. fib in 01/2016 and was placed on amiodarone  with repeat successful DCCV in 03/2016.  Follow-up echo in 07/2016 showed an improved LV systolic function with an EF of 60 to 65%, moderate LVH, grade 2 diastolic dysfunction, mild to moderate mitral regurgitation, mildly dilated left atrium, mildly to moderately dilated right atrium, and PASP 40 to 45 mmHg.  Her beta-blocker has previously been discontinued and amiodarone  reduced to 100 mg daily in the setting of bradycardia.  Echo in 06/2017 showed a normal LV systolic function with mild mitral regurgitation and mild to moderate tricuspid regurgitation.  She was admitted to [facility] in 11/2017 in the setting of recurrent falls and right flank hematoma with significant anemia.  During her admission she required 1 unit of PRBC and Xarelto  was placed on hold.  Amiodarone  was discontinued in the setting of bradycardia.  Oral anticoagulation ultimately ended up being held until 08/2018, at which time she was doing well  and had not had any recurrent falls leading her to be placed on Eliquis  2.5 mg twice daily.  She was noted to be back in rate controlled atrial flutter in 12/2018.  Out of concern for previous drop in EF, when in atrial flutter, echo was obtained in 01/2019 which showed a normal LV systolic function with an EF of 55 to 60% with trace mitral regurgitation.  Subsequent outpatient cardiac monitoring, to assess heart rates given prior history of bradycardia, showed an average heart rate of 73 bpm with a low of 41 bpm mostly occurring during the overnight hours.  Her highest heart rate was 176 bpm and she remained in A. fib/flutter throughout the monitoring period.  Given this, she was placed on metoprolol  25 mg twice daily.      She was admitted to [facility] in 09/2022 with a mechanical fall with right intertrochanteric femur fracture status post ORIF.  She did hit her head, though did not suffer LOC.  CT head showed no evidence of intracranial injury.  Admission was also notable for acute post operative blood loss anemia with hemoglobin trending from 12.8 on admission to 7.1 requiring 1 unit PRBC.  CTA chest was obtained during the admission which was negative for PE or acute aortic process.  Progression of fibrotic pulmonary changes along with coronary artery calcification and aortic atherosclerosis were noted.   She was last seen in the office in 07/2023 and was doing well from a cardiac perspective with rare, brief and self-limiting palpitations.  No changes in cardiac pharmacotherapy were pursued  at that time.  Echo in 02/2024 showed an EF of 50 to 55%, no regional wall motion abnormalities, low normal RV systolic function, moderately dilated left atrium, mild mitral regurgitation, tricuspid aortic valve with sclerosis without evidence of insufficiency or stenosis.   She was seen in the ER on 02/28/2024 with altered mental status/visual hallucinations.  CT of the head showed no acute intracranial abnormality with  chronic microvascular ischemic changes and cerebral volume loss.  Chest x-ray showed no acute cardiopulmonary process with stable chronic lung disease.  She was treated for possible UTI.  She comes in today continuing to do well from a cardiac perspective and remains without symptoms of angina or cardiac decompensation.  No palpitations, dyspnea, dizziness, presyncope, or syncope.  No falls, hematochezia, melena, hemoptysis, hematuria, or hematemesis.  No lower extremity swelling or progressive orthopnea.  She does not have any acute cardiac concerns at this time.   Labs independently reviewed: 02/2024 - potassium 4.0, BUN 16, serum creatinine 0.81, Hgb 12.1, PLT 180 12/2022 - TC 150, TG 79, HDL 60, LDL 75, albumin 3.9, AST/ALT normal 10/2022 - A1c 6.9 12/2018 - TSH normal  Past Medical History:  Diagnosis Date   Arthritis    In hands   Atrial flutter (HCC)    a. s/p successful TEE/DCCV 07/31/2015; b. 01/2016 Amio added for Afib; c. 11/2017 Amio d/c'd 2/2 bradycardia; d. 12/2018 Back in aflutter-->Zio: Avg HR 74 (41-176) 100% Afl->bb added back; e. CHADS2VASc => 6 (CHF, HTN, age x 2, DM, female)-->eliquis  2.5 bid.   Bradycardia    a. 11/2017 noted during hospitalization @ [facility]-->amio d/c'd by cardiology.   Cataract    Chronic combined systolic (congestive) and diastolic (congestive) heart failure (HCC)    a. 06/2015 Echo: EF 40%, basal HK, nl WM of mid and apical segments, mild to mod MR/TR; b. TEE 07/31/2015: nl LV systolic function, mild to mod MR, trivial TR; c. 06/2017 Echo: EF 55-60%, mild LVH, Gr2 DD, triv AI, mild MR, mod dil LA, mildly dil RA, mild to mod TR. PASP 35-46mmHg; d. 01/2019 Echo: Ef 55-60%, mod dil LA mildly dil RA. Mild TR. Trace MR.   Diabetes mellitus without complication (HCC)    Type II   GERD (gastroesophageal reflux disease)    Glaucoma    Right Eye   Hematoma of abdominal wall    a. 11/2017 Fall-->R flank hematoma w/ anemia req PRBC. Xarelto  dc'd at that time.   History  of SCC (squamous cell carcinoma) of skin 08/20/2020   left forearm EDC   Hyperlipidemia    Hypertension    Moderate mitral regurgitation    a. see echo from 06/2015 and TEE 07/2015; b. 06/2017 Echo: Mild MR.   PAF (paroxysmal atrial fibrillation) (HCC)    a.  01/2016 Admitted to [facility] with PAF -->amio added (later dc'd 11/2017 2/2 bradycardia).   Right renal mass    a. 11/2017 CT Abd [facility]): 3.4cm R upper pole renal mass concerning for renal cell carcinoma-->outpt f/u w/ urology.    Past Surgical History:  Procedure Laterality Date   BREAST BIOPSY Right 01/14/2021   us  bx 10:00 coil -INVASIVE MAMMARY CARCINOMA WITH LOBULAR FEATURES.   CATARACT EXTRACTION Bilateral    CESAREAN SECTION     ELECTROPHYSIOLOGIC STUDY N/A 07/31/2015   Procedure: CARDIOVERSION;  Surgeon: Elma Shelling, MD;  Location: [facility] ORS;  Service: Cardiovascular;  Laterality: N/A;   ELECTROPHYSIOLOGIC STUDY N/A 04/03/2016   Procedure: CARDIOVERSION;  Surgeon: Evalene JINNY Lunger, MD;  Location: [facility]  ORS;  Service: Cardiovascular;  Laterality: N/A;   INTRAMEDULLARY (IM) NAIL INTERTROCHANTERIC Right 10/29/2022   Procedure: INTRAMEDULLARY (IM) NAIL INTERTROCHANTERIC;  Surgeon: Lorelle Hussar, MD;  Location: [facility] ORS;  Service: Orthopedics;  Laterality: Right;   TEE WITHOUT CARDIOVERSION N/A 07/31/2015   Procedure: TRANSESOPHAGEAL ECHOCARDIOGRAM (TEE);  Surgeon: Elma Shelling, MD;  Location: [facility] ORS;  Service: Cardiovascular;  Laterality: N/A;   WISDOM TOOTH EXTRACTION     Wrist Surgery Left     Current Medications: Active Medications[1]  Allergies:   Patient has no known allergies.   Social History   Socioeconomic History   Marital status: Widowed    Spouse name: Not on file   Number of children: 1   Years of education: Not on file   Highest education level: 12th grade  Occupational History   Occupation: Retired  Tobacco Use   Smoking status: Never   Smokeless tobacco: Never   Tobacco comments:    smoking  cessation materials not required  Vaping Use   Vaping status: Never Used  Substance and Sexual Activity   Alcohol use: No   Drug use: No   Sexual activity: Not Currently  Other Topics Concern   Not on file  Social History Narrative   Not on file   Social Drivers of Health   Tobacco Use: Low Risk (03/15/2024)   Patient History    Smoking Tobacco Use: Never    Smokeless Tobacco Use: Never    Passive Exposure: Not on file  Financial Resource Strain: Not on file  Food Insecurity: No Food Insecurity (10/29/2022)   Hunger Vital Sign    Worried About Running Out of Food in the Last Year: Never true    Ran Out of Food in the Last Year: Never true  Transportation Needs: No Transportation Needs (10/28/2022)   PRAPARE - Administrator, Civil Service (Medical): No    Lack of Transportation (Non-Medical): No  Physical Activity: Not on file  Stress: Not on file  Social Connections: Not on file  Depression (EYV7-0): Not on file  Alcohol Screen: Not on file  Housing: Low Risk (10/28/2022)   Housing    Last Housing Risk Score: 0  Utilities: Not At Risk (10/29/2022)   AHC Utilities    Threatened with loss of utilities: No  Health Literacy: Not on file     Family History:  The patient's family history includes Aneurysm in her daughter; Cataracts in her mother; Heart attack in her brother, father, maternal grandfather, mother, and paternal grandfather. There is no history of Breast cancer.  ROS:   12-point review of systems is negative unless otherwise noted in the HPI.   EKGs/Labs/Other Studies Reviewed:    Studies reviewed were summarized above. The additional studies were reviewed today:  2D echo 02/28/2024: 1. Left ventricular ejection fraction, by estimation, is 50 to 55%. The  left ventricle has low normal function. The left ventricle has no regional  wall motion abnormalities. Left ventricular diastolic parameters are  indeterminate.   2. Right ventricular systolic  function is low normal. The right  ventricular size is not well visualized.   3. Left atrial size was moderately dilated.   4. The mitral valve is degenerative. Mild mitral valve regurgitation.   5. The aortic valve is tricuspid. Aortic valve regurgitation is not  visualized. Aortic valve sclerosis/calcification is present, without any  evidence of aortic stenosis.  __________  2D echo 09/2020: 1. Left ventricular ejection fraction, by estimation, is 55 to 60%.  The  left ventricle has normal function. The left ventricle has no regional  wall motion abnormalities. Left ventricular diastolic parameters are  indeterminate.   2. Right ventricular systolic function is normal. The right ventricular  size is normal.   3. Left atrial size was mild to moderately dilated.   4. The mitral valve is normal in structure. Trivial mitral valve  regurgitation.   5. The aortic valve is tricuspid. Aortic valve regurgitation is not  visualized.  __________   2D echo 01/2019: 1. Left ventricular ejection fraction, by visual estimation, is 55 to  60%. The left ventricle has normal function. There is no left ventricular  hypertrophy.   2. Left ventricular diastolic parameters are indeterminate.   3. Global right ventricle has normal systolic function.The right  ventricular size is normal. Right vetricular wall thickness was not  assessed.   4. Left atrial size was moderately dilated.   5. Right atrial size was mildly dilated.   6. The mitral valve is normal in structure. Trace mitral valve  regurgitation.   7. The tricuspid valve is normal in structure. Tricuspid valve  regurgitation is mild.   8. The aortic valve is tricuspid. Aortic valve regurgitation is not  visualized.   9. The pulmonic valve was not well visualized. Pulmonic valve  regurgitation is not visualized.  10. Mildly elevated pulmonary artery systolic pressure.  11. The inferior vena cava is normal in size with greater than 50%   respiratory variability, suggesting right atrial pressure of 3 mmHg. __________   Zio patch 12/2018: Analysis time 12 days 13 hours   Atrial Fibrillation/Flutter occurred continuously (100% burden), ranging from 41-176 bpm (avg of 73 bpm).  High rate at 6:35 AM, not patient triggered Low rate at 7:45 AM, not patient triggered   Isolated VEs were rare (<1.0%), VE Couplets were rare (<1.0%), and no VE Triplets were present.   Patient triggered events were not associated with significant arrhythmia apart from the flutter __________   2D echo 06/2017: - Left ventricle: The cavity size was at the upper limits of    normal. Wall thickness was increased in a pattern of mild LVH.    Systolic function was normal. The estimated ejection fraction was    in the range of 55% to 60%. Regional wall motion abnormalities    cannot be excluded. Features are consistent with a pseudonormal    left ventricular filling pattern, with concomitant abnormal    relaxation and increased filling pressure (grade 2 diastolic    dysfunction).  - Aortic valve: Mildly calcified annulus. Trileaflet. There was    trivial regurgitation.  - Mitral valve: Mildly calcified annulus. There was mild    regurgitation.  - Left atrium: The atrium was moderately dilated.  - Right atrium: The atrium was mildly dilated.  - Tricuspid valve: There was mild-moderate regurgitation.  - Pulmonary arteries: Systolic pressure was mildly to moderately    increased, in the range of 35 mm Hg to 40 mm Hg plus central    venous/right atrial pressure.  - Pericardium, extracardiac: A trivial pericardial effusion was    identified posterior to the heart. __________   2D echo 07/2016: - Left ventricle: The cavity size was normal. Wall thickness was    increased in a pattern of moderate LVH. Systolic function was    normal. The estimated ejection fraction was in the range of 60%    to 65%. Features are consistent with a pseudonormal left     ventricular  filling pattern, with concomitant abnormal relaxation    and increased filling pressure (grade 2 diastolic dysfunction).    Acoustic contrast opacification revealed no evidence ofthrombus.  - Mitral valve: There was mild to moderate regurgitation.  - Left atrium: The atrium was mildly dilated.  - Right atrium: The atrium was mildly to moderately dilated.  - Pulmonary arteries: Systolic pressure was mildly to moderately    increased, in the range of 40 mm Hg to 45 mm Hg. __________   2D echo 01/2016: - Left ventricle: The cavity size was normal. There was mild    concentric hypertrophy. Systolic function was normal. The    estimated ejection fraction was in the range of 55% to 60%. Wall    motion was normal; there were no regional wall motion    abnormalities. The study is not technically sufficient to allow    evaluation of LV diastolic function.  - Mitral valve: There was mild regurgitation.  - Left atrium: The atrium was mildly dilated.  - Right ventricle: Systolic function was normal.  - Pulmonary arteries: Systolic pressure was mildly elevated. PA    peak pressure: 35 mm Hg (S).   Impressions:   - Rhythm is atrial fibrillation/flutter. __________   TEE 07/2015: - Left atrium: No evidence of thrombus in the atrial cavity or    appendage.  __________   24-hour Holter monitor 06/2015: Overall rhythm appears to be atrial flutter. Average heart rate of 78 BPM.  Minimum heart rate of 47 bpm at 4:53 AM 07/11/2015. 3% of total number of beats were in bradycardia. Maximum heart rate of 189 bpm at 10:19 AM of 07/10/2015. 11% of the total number of beats were in tachycardia. __________   2D echo 06/2015: - Left ventricle: The cavity size was normal. Wall thickness was    normal. The estimated ejection fraction was approximately 40%.    There is hypokinesis in the basal segments, normal wall motion in    the mid and apical segments.  - Mitral valve: Calcified annulus.  There was mild to moderate    regurgitation.  - Left atrium: The atrium was moderately dilated.  - Right atrium: The atrium was moderately dilated.  - Tricuspid valve: There was mild-moderate regurgitation.   EKG:  EKG is ordered today.  The EKG ordered today demonstrates A-fib with slow ventricular response with aberrancy versus very PVC, 56 bpm, nonspecific ST-T changes  Recent Labs: 02/28/2024: BUN 16; Creatinine, Ser 0.81; Hemoglobin 12.1; Platelets 180; Potassium 4.0; Sodium 140  Recent Lipid Panel    Component Value Date/Time   CHOL 150 01/13/2023 1154   CHOL 159 05/15/2015 1619   TRIG 79 01/13/2023 1154   TRIG 124 05/15/2015 1619   HDL 60 01/13/2023 1154   CHOLHDL 2.5 01/13/2023 1154   CHOLHDL 2.9 01/01/2022 1653   VLDL 19 01/01/2022 1653   VLDL 25 05/15/2015 1619   LDLCALC 75 01/13/2023 1154   LDLCALC 59 06/21/2017 1414    PHYSICAL EXAM:    VS:  BP 98/62   Pulse (!) 56   Ht 5' 6 (1.676 m)   Wt 121 lb 9.6 oz (55.2 kg)   SpO2 98%   BMI 19.63 kg/m   BMI: Body mass index is 19.63 kg/m.  Physical Exam Constitutional:      Appearance: She is well-developed.  HENT:     Head: Normocephalic and atraumatic.  Eyes:     General:        Right eye: No discharge.  Left eye: No discharge.  Cardiovascular:     Rate and Rhythm: Normal rate. Rhythm irregularly irregular.     Heart sounds: S1 normal and S2 normal. Heart sounds not distant. No midsystolic click and no opening snap. Murmur heard.     Systolic murmur is present with a grade of 2/6 at the upper right sternal border.     No friction rub.  Pulmonary:     Effort: Pulmonary effort is normal. No respiratory distress.     Breath sounds: Normal breath sounds. No decreased breath sounds, wheezing, rhonchi or rales.  Musculoskeletal:     Cervical back: Normal range of motion.  Skin:    General: Skin is warm and dry.     Nails: There is no clubbing.  Neurological:     Mental Status: She is alert and oriented  to person, place, and time.  Psychiatric:        Speech: Speech normal.        Behavior: Behavior normal.        Thought Content: Thought content normal.        Judgment: Judgment normal.     Wt Readings from Last 3 Encounters:  03/15/24 121 lb 9.6 oz (55.2 kg)  02/28/24 124 lb 12.5 oz (56.6 kg)  08/25/23 124 lb 12.8 oz (56.6 kg)     ASSESSMENT & PLAN:   Permanent A-fib: Ventricular rates well-controlled on Lopressor  12.5 mg twice daily.  CHA2DS2-VASc at least 7 (CHF, HTN, age x 2, DM, vascular disease, sex category).  Remains on apixaban  2.5 mg twice daily (age and weight).  Recent labs stable.  History of cardiomyopathy with mitral regurgitation: Echo obtained earlier this month showed low normal LV systolic function with stable to improved mild mitral regurgitation.  Remains on lisinopril  40 mg, Lopressor  12.5 mg twice daily, and furosemide  20 mg daily.  Recent labs stable.  Given advanced age, lack of heart failure symptoms, and with preserved LV systolic function, defer further escalation of GDMT at this time.  Coronary artery calcification/aortic atherosclerosis/HLD: No symptoms suggestive of angina.  LDL 75 in 12/2022.  On apixaban  in lieu of aspirin  to minimize bleeding risk.  Remains on atorvastatin  20 mg.  HTN: Blood pressure is on the soft side, she is asymptomatic.  Reduce amlodipine  to 5 mg with continuation of lisinopril  40 mg and Lopressor  12.5 mg twice daily.    Disposition: F/u with Dr. Darron or an APP in 6 months.   Medication Adjustments/Labs and Tests Ordered: Current medicines are reviewed at length with the patient today.  Concerns regarding medicines are outlined above. Medication changes, Labs and Tests ordered today are summarized above and listed in the Patient Instructions accessible in Encounters.   Bonney Bernardino Bring, PA-C 03/15/2024 12:27 PM     [facility] [place] [address] [place], [place] [address] [phone]     [1]  Current Meds  Medication Sig   Acetaminophen  Extra Strength 500 MG TABS Take 2 tablets by mouth 2 (two) times daily.   alendronate  (FOSAMAX ) 70 MG tablet Take 1 tablet (70 mg total) by mouth once a week. Take with a full glass of water on an empty stomach.   alum & mag hydroxide-simeth (GERI-LANTA) 200-200-20 MG/5ML suspension Take by mouth every 6 (six) hours as needed for indigestion or heartburn.   ascorbic acid (VITAMIN C) 1000 MG tablet Take 1,000 mg by mouth daily.   atorvastatin  (LIPITOR) 20 MG tablet Take 20 mg  by mouth daily.   b complex vitamins capsule Take 1 capsule by mouth daily.   furosemide  (LASIX ) 20 MG tablet Take 1 tablet (20 mg total) by mouth daily.   insulin  glargine (LANTUS ) 100 UNIT/ML injection Inject 12 Units into the skin at bedtime. (Patient taking differently: Inject 15 Units into the skin at bedtime.)   letrozole  (FEMARA ) 2.5 MG tablet Take 1 tablet (2.5 mg total) by mouth daily.   polyethylene glycol (MIRALAX  / GLYCOLAX ) 17 g packet Take 17 g by mouth daily.   senna (SENOKOT) 8.6 MG TABS tablet Take 2 tablets by mouth at bedtime.   Zinc Oxide (TRIPLE PASTE) 12.8 % ointment Apply 1 Application topically. To buttocks every shift.   [DISCONTINUED] amLODipine  (NORVASC ) 10 MG tablet Take 1 tablet (10 mg total) by mouth daily.   [DISCONTINUED] apixaban  (ELIQUIS ) 2.5 MG TABS tablet Take 1 tablet (2.5 mg total) by mouth 2 (two) times daily.   [DISCONTINUED] lisinopril  (ZESTRIL ) 40 MG tablet Take 1 tablet (40 mg total) by mouth daily.   [DISCONTINUED] metoprolol  tartrate (LOPRESSOR ) 25 MG tablet TAKE (1/2) TABLET TWICE DAILY. (Patient taking differently: Take 12.5 mg by mouth 2 (two) times daily.)

## 2024-03-15 ENCOUNTER — Encounter: Payer: Self-pay | Admitting: Physician Assistant

## 2024-03-15 ENCOUNTER — Ambulatory Visit: Attending: Physician Assistant | Admitting: Physician Assistant

## 2024-03-15 VITALS — BP 98/62 | HR 56 | Ht 66.0 in | Wt 121.6 lb

## 2024-03-15 DIAGNOSIS — E785 Hyperlipidemia, unspecified: Secondary | ICD-10-CM | POA: Diagnosis not present

## 2024-03-15 DIAGNOSIS — I10 Essential (primary) hypertension: Secondary | ICD-10-CM

## 2024-03-15 DIAGNOSIS — I2510 Atherosclerotic heart disease of native coronary artery without angina pectoris: Secondary | ICD-10-CM

## 2024-03-15 DIAGNOSIS — I429 Cardiomyopathy, unspecified: Secondary | ICD-10-CM | POA: Diagnosis not present

## 2024-03-15 DIAGNOSIS — I4821 Permanent atrial fibrillation: Secondary | ICD-10-CM

## 2024-03-15 DIAGNOSIS — I340 Nonrheumatic mitral (valve) insufficiency: Secondary | ICD-10-CM

## 2024-03-15 DIAGNOSIS — I700 Atherosclerosis of aorta: Secondary | ICD-10-CM

## 2024-03-15 MED ORDER — APIXABAN 2.5 MG PO TABS: 2.5000 mg | ORAL_TABLET | Freq: Two times a day (BID) | ORAL | 11 refills | Status: DC

## 2024-03-15 MED ORDER — AMLODIPINE BESYLATE 5 MG PO TABS: 5.0000 mg | ORAL_TABLET | Freq: Every day | ORAL | 3 refills | Status: AC

## 2024-03-15 MED ORDER — APIXABAN 2.5 MG PO TABS: 2.5000 mg | ORAL_TABLET | Freq: Two times a day (BID) | ORAL | 11 refills | Status: AC

## 2024-03-15 MED ORDER — METOPROLOL TARTRATE 25 MG PO TABS: 12.5000 mg | ORAL_TABLET | Freq: Two times a day (BID) | ORAL | 3 refills | Status: AC

## 2024-03-15 MED ORDER — LISINOPRIL 40 MG PO TABS: 40.0000 mg | ORAL_TABLET | Freq: Every day | ORAL | 3 refills | Status: AC

## 2024-03-15 NOTE — Patient Instructions (Signed)
 Medication Instructions:  Your physician recommends the following medication changes.  DECREASE: Amlodipine  5 mg daily  *If you need a refill on your cardiac medications before your next appointment, please call your pharmacy*  Lab Work: None ordered at this time   Follow-Up: At [facility], you and your health needs are our priority.  As part of our continuing mission to provide you with exceptional heart care, our providers are all part of one team.  This team includes your primary Cardiologist (physician) and Advanced Practice Providers or APPs (Physician Assistants and Nurse Practitioners) who all work together to provide you with the care you need, when you need it.  Your next appointment:   6 month(s)  Provider:   You may see Deatrice Cage, MD or Bernardino Bring, PA-C  We recommend signing up for the patient portal called MyChart.  Sign up information is provided on this After Visit Summary.  MyChart is used to connect with patients for Virtual Visits (Telemedicine).  Patients are able to view lab/test results, encounter notes, upcoming appointments, etc.  Non-urgent messages can be sent to your provider as well.   To learn more about what you can do with MyChart, go to [email or web address].

## 2024-04-04 ENCOUNTER — Ambulatory Visit: Admitting: Podiatry

## 2024-04-04 DIAGNOSIS — L97522 Non-pressure chronic ulcer of other part of left foot with fat layer exposed: Secondary | ICD-10-CM

## 2024-04-04 NOTE — Progress Notes (Signed)
 "  Subjective:  Patient ID: Amber Wolfe, female    DOB: 05-03-1931,  MRN: [id number]  Chief Complaint  Patient presents with   Toe Pain    Left great toe     89 y.o. female presents with the above complaint.  Patient presents for left great toe distal tip ulceration.  Patient states that she dropped a can on her foot long time ago she states has been causing her a lot of soreness.  She is here from her nursing home to be evaluated she is a diabetic.  Pain scale is 2 out of 10 dull aching nature.  She would like to discuss treatment options she is here with her caretaker   Review of Systems: Negative except as noted in the HPI. Denies N/V/F/Ch.  Past Medical History:  Diagnosis Date   Arthritis    In hands   Atrial flutter (HCC)    a. s/p successful TEE/DCCV 07/31/2015; b. 01/2016 Amio added for Afib; c. 11/2017 Amio d/c'd 2/2 bradycardia; d. 12/2018 Back in aflutter-->Zio: Avg HR 74 (41-176) 100% Afl->bb added back; e. CHADS2VASc => 6 (CHF, HTN, age x 2, DM, female)-->eliquis  2.5 bid.   Bradycardia    a. 11/2017 noted during hospitalization @ [facility]-->amio d/c'd by cardiology.   Cataract    Chronic combined systolic (congestive) and diastolic (congestive) heart failure (HCC)    a. 06/2015 Echo: EF 40%, basal HK, nl WM of mid and apical segments, mild to mod MR/TR; b. TEE 07/31/2015: nl LV systolic function, mild to mod MR, trivial TR; c. 06/2017 Echo: EF 55-60%, mild LVH, Gr2 DD, triv AI, mild MR, mod dil LA, mildly dil RA, mild to mod TR. PASP 35-73mmHg; d. 01/2019 Echo: Ef 55-60%, mod dil LA mildly dil RA. Mild TR. Trace MR.   Diabetes mellitus without complication (HCC)    Type II   GERD (gastroesophageal reflux disease)    Glaucoma    Right Eye   Hematoma of abdominal wall    a. 11/2017 Fall-->R flank hematoma w/ anemia req PRBC. Xarelto  dc'd at that time.   History of SCC (squamous cell carcinoma) of skin 08/20/2020   left forearm EDC   Hyperlipidemia    Hypertension    Moderate mitral  regurgitation    a. see echo from 06/2015 and TEE 07/2015; b. 06/2017 Echo: Mild MR.   PAF (paroxysmal atrial fibrillation) (HCC)    a.  01/2016 Admitted to [facility] with PAF -->amio added (later dc'd 11/2017 2/2 bradycardia).   Right renal mass    a. 11/2017 CT Abd [facility]): 3.4cm R upper pole renal mass concerning for renal cell carcinoma-->outpt f/u w/ urology.   Current Medications[1]  Tobacco Use History[2]  Allergies[3] Objective:  There were no vitals filed for this visit. There is no height or weight on file to calculate BMI. Constitutional Well developed. Well nourished.  Vascular Dorsalis pedis pulses palpable bilaterally. Posterior tibial pulses palpable bilaterally. Capillary refill normal to all digits.  No cyanosis or clubbing noted. Pedal hair growth normal.  Neurologic Normal speech. Oriented to person, place, and time. Epicritic sensation to light touch grossly present bilaterally.  Dermatologic Left great toe ulceration with fat layer exposed pain on palpation.  Does not probe down to deep tissue.  No purulent drainage noted no signs of infection noted.  Orthopedic: Normal joint ROM without pain or crepitus bilaterally. No visible deformities. No bony tenderness.   Radiographs: None Assessment:   1. Skin ulcer of left great toe with fat  layer exposed [facility])    Plan:  Patient was evaluated and treated and all questions answered.  Left great toe ulcer fat layer exposed - All questions or concerns were discussed with the patient in extensive detail given the presence of ulceration she would benefit from Betadine wet-to-dry dressing changes daily.  This was discussed this in extensive detail she states understanding.  This was also written down on nursing home instructions. - Discussed shoe gear modification - I will see her back in 3 weeks for further evaluation and management  No follow-ups on file.    [1]  Current Outpatient Medications:    Acetaminophen  Extra  Strength 500 MG TABS, Take 2 tablets by mouth 2 (two) times daily., Disp: , Rfl:    alendronate  (FOSAMAX ) 70 MG tablet, Take 1 tablet (70 mg total) by mouth once a week. Take with a full glass of water on an empty stomach., Disp: 12 tablet, Rfl: 0   alum & mag hydroxide-simeth (GERI-LANTA) 200-200-20 MG/5ML suspension, Take by mouth every 6 (six) hours as needed for indigestion or heartburn., Disp: , Rfl:    amLODipine  (NORVASC ) 5 MG tablet, Take 1 tablet (5 mg total) by mouth daily., Disp: 90 tablet, Rfl: 3   apixaban  (ELIQUIS ) 2.5 MG TABS tablet, Take 1 tablet (2.5 mg total) by mouth 2 (two) times daily., Disp: 60 tablet, Rfl: 11   ascorbic acid (VITAMIN C) 1000 MG tablet, Take 1,000 mg by mouth daily., Disp: , Rfl:    atorvastatin  (LIPITOR) 20 MG tablet, Take 20 mg by mouth daily., Disp: , Rfl:    b complex vitamins capsule, Take 1 capsule by mouth daily., Disp: , Rfl:    furosemide  (LASIX ) 20 MG tablet, Take 1 tablet (20 mg total) by mouth daily., Disp: 30 tablet, Rfl: 2   insulin  glargine (LANTUS ) 100 UNIT/ML injection, Inject 12 Units into the skin at bedtime. (Patient taking differently: Inject 15 Units into the skin at bedtime.), Disp: , Rfl:    letrozole  (FEMARA ) 2.5 MG tablet, Take 1 tablet (2.5 mg total) by mouth daily., Disp: 90 tablet, Rfl: 3   lisinopril  (ZESTRIL ) 40 MG tablet, Take 1 tablet (40 mg total) by mouth daily., Disp: 90 tablet, Rfl: 3   metoprolol  tartrate (LOPRESSOR ) 25 MG tablet, Take 0.5 tablets (12.5 mg total) by mouth 2 (two) times daily., Disp: 90 tablet, Rfl: 3   polyethylene glycol (MIRALAX  / GLYCOLAX ) 17 g packet, Take 17 g by mouth daily., Disp: , Rfl:    senna (SENOKOT) 8.6 MG TABS tablet, Take 2 tablets by mouth at bedtime., Disp: , Rfl:    Zinc Oxide (TRIPLE PASTE) 12.8 % ointment, Apply 1 Application topically. To buttocks every shift., Disp: , Rfl:  [2]  Social History Tobacco Use  Smoking Status Never  Smokeless Tobacco Never  Tobacco Comments   smoking  cessation materials not required  [3] No Known Allergies  "

## 2024-04-13 ENCOUNTER — Encounter: Payer: Self-pay | Admitting: Oncology

## 2024-04-13 ENCOUNTER — Inpatient Hospital Stay: Payer: Medicare Other | Attending: Oncology | Admitting: Oncology

## 2024-04-13 VITALS — BP 141/76 | HR 78 | Temp 97.0°F | Resp 16 | Ht 66.0 in | Wt 123.0 lb

## 2024-04-13 DIAGNOSIS — Z17411 Hormone receptor positive with human epidermal growth factor receptor 2 negative status: Secondary | ICD-10-CM | POA: Insufficient documentation

## 2024-04-13 DIAGNOSIS — Z170 Estrogen receptor positive status [ER+]: Secondary | ICD-10-CM | POA: Diagnosis not present

## 2024-04-13 DIAGNOSIS — Z1721 Progesterone receptor positive status: Secondary | ICD-10-CM | POA: Insufficient documentation

## 2024-04-13 DIAGNOSIS — Z79811 Long term (current) use of aromatase inhibitors: Secondary | ICD-10-CM | POA: Insufficient documentation

## 2024-04-13 DIAGNOSIS — M810 Age-related osteoporosis without current pathological fracture: Secondary | ICD-10-CM | POA: Insufficient documentation

## 2024-04-13 DIAGNOSIS — C50411 Malignant neoplasm of upper-outer quadrant of right female breast: Secondary | ICD-10-CM | POA: Insufficient documentation

## 2024-04-13 NOTE — Progress Notes (Signed)
 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  INTERVAL HISTORY: Patient returns to clinic today for routine yearly evaluation.  She continues to tolerate letrozole  well without significant side effects.  She has no neurologic complaints.  She denies any recent fevers or illnesses.  She has a good appetite and denies weight loss.  She has no chest pain, shortness of breath, cough, or hemoptysis.  She denies any nausea, vomiting, constipation, or diarrhea.  She has no urinary complaints.  Patient offers no specific complaints today.  REVIEW OF SYSTEMS:   Review of Systems  Constitutional: Negative.  Negative for fever, malaise/fatigue and weight loss.  Respiratory: Negative.  Negative for cough, hemoptysis and shortness of breath.   Cardiovascular: Negative.  Negative for chest pain and leg swelling.  Gastrointestinal: Negative.  Negative for abdominal pain.  Genitourinary: Negative.  Negative for dysuria.  Musculoskeletal: Negative.  Negative for back pain.  Skin: Negative.  Negative for rash.  Neurological: Negative.  Negative for dizziness, focal weakness, weakness and headaches.  Psychiatric/Behavioral: Negative.  The patient is not nervous/anxious.     As per HPI. Otherwise, a complete review of systems is negative.  PAST MEDICAL HISTORY: Past Medical History:  Diagnosis Date   Arthritis    In hands   Atrial flutter (HCC)    a. s/p successful  TEE/DCCV 07/31/2015; b. 01/2016 Amio added for Afib; c. 11/2017 Amio d/c'd 2/2 bradycardia; d. 12/2018 Back in aflutter-->Zio: Avg HR 74 (41-176) 100% Afl->bb added back; e. CHADS2VASc => 6 (CHF, HTN, age x 2, DM, female)-->eliquis  2.5 bid.   Bradycardia    a. 11/2017 noted during hospitalization @ [facility]-->amio d/c'd by cardiology.   Cataract    Chronic combined systolic (congestive) and diastolic (congestive) heart failure (HCC)    a. 06/2015 Echo: EF 40%, basal HK, nl WM of mid and apical segments, mild to mod MR/TR; b. TEE 07/31/2015: nl LV systolic function, mild to mod MR, trivial TR; c. 06/2017 Echo: EF 55-60%, mild LVH, Gr2 DD, triv AI, mild MR, mod dil LA, mildly dil RA, mild to mod TR. PASP 35-32mmHg; d. 01/2019 Echo: Ef 55-60%, mod dil LA mildly dil RA. Mild TR. Trace MR.   Diabetes mellitus without complication (HCC)    Type II   GERD (gastroesophageal reflux disease)    Glaucoma    Right Eye   Hematoma of abdominal wall    a. 11/2017 Fall-->R flank hematoma w/ anemia req PRBC. Xarelto  dc'd at that time.   History of SCC (squamous cell carcinoma) of skin 08/20/2020   left forearm EDC   Hyperlipidemia    Hypertension    Moderate mitral regurgitation    a. see echo from 06/2015 and TEE 07/2015; b. 06/2017 Echo: Mild MR.   PAF (paroxysmal atrial fibrillation) (HCC)    a.  01/2016 Admitted to [facility] with PAF -->amio added (later dc'd 11/2017 2/2 bradycardia).   Right renal mass    a. 11/2017 CT Abd [facility]): 3.4cm R upper pole renal mass concerning for renal  cell carcinoma-->outpt f/u w/ urology.    PAST SURGICAL HISTORY: Past Surgical History:  Procedure Laterality Date   BREAST BIOPSY Right 01/14/2021   us  bx 10:00 coil -INVASIVE MAMMARY CARCINOMA WITH LOBULAR FEATURES.   CATARACT EXTRACTION Bilateral    CESAREAN SECTION     ELECTROPHYSIOLOGIC STUDY N/A 07/31/2015   Procedure: CARDIOVERSION;  Surgeon: Elma Shelling, MD;  Location: [facility] ORS;  Service: Cardiovascular;  Laterality: N/A;    ELECTROPHYSIOLOGIC STUDY N/A 04/03/2016   Procedure: CARDIOVERSION;  Surgeon: Evalene JINNY Lunger, MD;  Location: [facility] ORS;  Service: Cardiovascular;  Laterality: N/A;   INTRAMEDULLARY (IM) NAIL INTERTROCHANTERIC Right 10/29/2022   Procedure: INTRAMEDULLARY (IM) NAIL INTERTROCHANTERIC;  Surgeon: Lorelle Hussar, MD;  Location: [facility] ORS;  Service: Orthopedics;  Laterality: Right;   TEE WITHOUT CARDIOVERSION N/A 07/31/2015   Procedure: TRANSESOPHAGEAL ECHOCARDIOGRAM (TEE);  Surgeon: Elma Shelling, MD;  Location: [facility] ORS;  Service: Cardiovascular;  Laterality: N/A;   WISDOM TOOTH EXTRACTION     Wrist Surgery Left     FAMILY HISTORY: Family History  Problem Relation Age of Onset   Cataracts Mother    Heart attack Mother    Heart attack Father    Aneurysm Daughter    Heart attack Maternal Grandfather    Heart attack Paternal Grandfather    Heart attack Brother    Breast cancer Neg Hx     ADVANCED DIRECTIVES (Y/N):  N  HEALTH MAINTENANCE: Social History   Tobacco Use   Smoking status: Never   Smokeless tobacco: Never   Tobacco comments:    smoking cessation materials not required  Vaping Use   Vaping status: Never Used  Substance Use Topics   Alcohol use: No   Drug use: No     Colonoscopy:  PAP:  Bone density:  Lipid panel:  No Known Allergies  Current Outpatient Medications  Medication Sig Dispense Refill   Acetaminophen  Extra Strength 500 MG TABS Take 2 tablets by mouth 2 (two) times daily.     alendronate  (FOSAMAX ) 70 MG tablet Take 1 tablet (70 mg total) by mouth once a week. Take with a full glass of water on an empty stomach. 12 tablet 0   alum & mag hydroxide-simeth (GERI-LANTA) 200-200-20 MG/5ML suspension Take by mouth every 6 (six) hours as needed for indigestion or heartburn.     amLODipine  (NORVASC ) 5 MG tablet Take 1 tablet (5 mg total) by mouth daily. 90 tablet 3   apixaban  (ELIQUIS ) 2.5 MG TABS tablet Take 1 tablet (2.5 mg total) by mouth 2 (two) times  daily. 60 tablet 11   ascorbic acid (VITAMIN C) 1000 MG tablet Take 1,000 mg by mouth daily.     atorvastatin  (LIPITOR) 20 MG tablet Take 20 mg by mouth daily.     b complex vitamins capsule Take 1 capsule by mouth daily.     furosemide  (LASIX ) 20 MG tablet Take 1 tablet (20 mg total) by mouth daily. 30 tablet 2   insulin  glargine (LANTUS ) 100 UNIT/ML injection Inject 12 Units into the skin at bedtime. (Patient taking differently: Inject 15 Units into the skin at bedtime.)     letrozole  (FEMARA ) 2.5 MG tablet Take 1 tablet (2.5 mg total) by mouth daily. 90 tablet 3   lisinopril  (ZESTRIL ) 40 MG tablet Take 1 tablet (40 mg total) by mouth daily. 90 tablet 3   metoprolol  tartrate (LOPRESSOR ) 25 MG tablet Take 0.5 tablets (12.5 mg total) by mouth 2 (two) times daily. 90 tablet 3   polyethylene glycol (MIRALAX  /  GLYCOLAX ) 17 g packet Take 17 g by mouth daily.     senna (SENOKOT) 8.6 MG TABS tablet Take 2 tablets by mouth at bedtime.     Zinc Oxide (TRIPLE PASTE) 12.8 % ointment Apply 1 Application topically. To buttocks every shift.     No current facility-administered medications for this visit.    OBJECTIVE: Vitals:   04/13/24 1411  BP: (!) 141/76  Pulse: 78  Resp: 16  Temp: (!) 97 F (36.1 C)  SpO2: 97%     Body mass index is 19.85 kg/m.    ECOG FS:0 - Asymptomatic  General: Thin, no acute distress. Eyes: Pink conjunctiva, anicteric sclera. HEENT: Normocephalic, moist mucous membranes. Lungs: No audible wheezing or coughing. Heart: Regular rate and rhythm. Abdomen: Soft, nontender, no obvious distention. Musculoskeletal: No edema, cyanosis, or clubbing. Neuro: Alert, answering all questions appropriately. Cranial nerves grossly intact. Skin: No rashes or petechiae noted. Psych: Normal affect.  LAB RESULTS:  Lab Results  Component Value Date   NA 140 02/28/2024   K 4.0 02/28/2024   CL 104 02/28/2024   CO2 23 02/28/2024   GLUCOSE 184 (H) 02/28/2024   BUN 16 02/28/2024    CREATININE 0.81 02/28/2024   CALCIUM  9.4 02/28/2024   PROT 6.6 01/13/2023   ALBUMIN 3.9 01/13/2023   AST 15 01/13/2023   ALT 7 01/13/2023   ALKPHOS 136 (H) 01/13/2023   BILITOT 0.5 01/13/2023   GFRNONAA >60 02/28/2024   GFRAA 96 01/05/2019    Lab Results  Component Value Date   WBC 6.5 02/28/2024   NEUTROABS 4.4 02/28/2024   HGB 12.1 02/28/2024   HCT 35.8 (L) 02/28/2024   MCV 98.1 02/28/2024   PLT 180 02/28/2024     STUDIES: No results found.  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  She was initiated on letrozole  which is recommended that she take indefinitely.  No further intervention is needed.  Patient's most recent mammogram on March 01, 2024 revealed persistent disease with possible slight progression.  Patient continues to request minimal follow-up, therefore she will return to clinic in 1 year with repeat mammogram and further evaluation.   Osteoporosis: Bone mineral density on November 09, 2023 revealed persistent osteoporosis.  Continue Fosamax , calcium , and vitamin D.    I spent a total of 20 minutes reviewing chart data, face-to-face evaluation with the patient, counseling and coordination of care as detailed above.   Patient expressed understanding and was in agreement with this plan. She also understands that She can call clinic at any time with any questions, concerns, or complaints.    Cancer Staging  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, right [facility]) Staging form: Breast, AJCC 8th Edition - Clinical stage from 01/23/2021: Stage IIA (cT3, cN0, cM0, G1, ER+, PR+, HER2-) - Signed by Jacobo Evalene PARAS, MD on 01/23/2021 Stage prefix: Initial diagnosis Histologic  grading system: 3 grade system  Evalene PARAS Jacobo, MD   04/13/2024 4:18 PM     "

## 2024-04-25 ENCOUNTER — Ambulatory Visit: Admitting: Podiatry

## 2024-09-18 ENCOUNTER — Ambulatory Visit: Admitting: Physician Assistant

## 2025-04-16 ENCOUNTER — Inpatient Hospital Stay: Admitting: Oncology
